# Patient Record
Sex: Male | Born: 1945 | Race: White | Hispanic: No | Marital: Married | Smoking: Current every day smoker
Health system: Southern US, Community
[De-identification: ages and names within clinical notes are randomized; demographics above are authoritative.]

## PROBLEM LIST (undated history)

## (undated) ENCOUNTER — Ambulatory Visit (HOSPITAL_BASED_OUTPATIENT_CLINIC_OR_DEPARTMENT_OTHER)

## (undated) DIAGNOSIS — R0600 Dyspnea, unspecified: Secondary | ICD-10-CM

## (undated) DIAGNOSIS — C801 Malignant (primary) neoplasm, unspecified: Secondary | ICD-10-CM

## (undated) DIAGNOSIS — I712 Thoracic aortic aneurysm, without rupture, unspecified: Secondary | ICD-10-CM

## (undated) DIAGNOSIS — J984 Other disorders of lung: Secondary | ICD-10-CM

## (undated) DIAGNOSIS — I10 Essential (primary) hypertension: Secondary | ICD-10-CM

## (undated) DIAGNOSIS — R05 Cough: Secondary | ICD-10-CM

## (undated) DIAGNOSIS — J449 Chronic obstructive pulmonary disease, unspecified: Secondary | ICD-10-CM

## (undated) DIAGNOSIS — F419 Anxiety disorder, unspecified: Secondary | ICD-10-CM

## (undated) DIAGNOSIS — K219 Gastro-esophageal reflux disease without esophagitis: Secondary | ICD-10-CM

## (undated) DIAGNOSIS — J40 Bronchitis, not specified as acute or chronic: Secondary | ICD-10-CM

## (undated) HISTORY — PX: EYE SURGERY: SHX253

## (undated) HISTORY — PX: LUNG LOBECTOMY: SHX167

## (undated) HISTORY — DX: Other disorders of lung: J98.4

## (undated) HISTORY — PX: HERNIA REPAIR: SHX51

## (undated) HISTORY — PX: DIAGNOSTIC LAPAROSCOPY: SUR761

## (undated) HISTORY — DX: Cough: R05

---

## 1999-12-21 ENCOUNTER — Encounter: Payer: Self-pay | Admitting: Family Medicine

## 1999-12-21 ENCOUNTER — Encounter: Admission: RE | Admit: 1999-12-21 | Discharge: 1999-12-21 | Payer: Self-pay | Admitting: *Deleted

## 2002-11-05 ENCOUNTER — Ambulatory Visit (HOSPITAL_COMMUNITY): Admission: RE | Admit: 2002-11-05 | Discharge: 2002-11-05 | Payer: Self-pay

## 2005-05-01 ENCOUNTER — Emergency Department (HOSPITAL_COMMUNITY): Admission: EM | Admit: 2005-05-01 | Discharge: 2005-05-01 | Payer: Self-pay | Admitting: Emergency Medicine

## 2007-07-06 ENCOUNTER — Ambulatory Visit: Payer: Self-pay | Admitting: Internal Medicine

## 2007-07-06 LAB — CONVERTED CEMR LAB
Albumin: 3.9 g/dL (ref 3.5–5.2)
Alkaline Phosphatase: 59 units/L (ref 39–117)
BUN: 11 mg/dL (ref 6–23)
Basophils Absolute: 0 10*3/uL (ref 0.0–0.1)
Cholesterol: 191 mg/dL (ref 0–200)
GFR calc Af Amer: 127 mL/min
HCT: 41 % (ref 39.0–52.0)
HDL: 59.5 mg/dL (ref >39.0)
Hemoglobin: 14.3 g/dL (ref 13.0–17.0)
Lymphocytes Relative: 29.2 % (ref 12.0–46.0)
MCHC: 34.8 g/dL (ref 30.0–36.0)
MCV: 95.5 fL (ref 78.0–100.0)
Monocytes Absolute: 1 10*3/uL — ABNORMAL HIGH (ref 0.2–0.7)
Monocytes Relative: 11.4 % — ABNORMAL HIGH (ref 3.0–11.0)
Neutro Abs: 4.9 10*3/uL (ref 1.4–7.7)
Neutrophils Relative %: 53.9 % (ref 43.0–77.0)
Potassium: 4.5 meq/L (ref 3.5–5.1)
Sodium: 141 meq/L (ref 135–145)
TSH: 2.15 microintl units/mL (ref 0.35–5.50)
Total Bilirubin: 0.5 mg/dL (ref 0.3–1.2)
Total Protein: 7.3 g/dL (ref 6.0–8.3)

## 2007-07-13 ENCOUNTER — Ambulatory Visit: Payer: Self-pay | Admitting: Internal Medicine

## 2007-07-13 ENCOUNTER — Encounter: Payer: Self-pay | Admitting: Internal Medicine

## 2007-07-13 DIAGNOSIS — J984 Other disorders of lung: Secondary | ICD-10-CM

## 2007-07-13 DIAGNOSIS — R059 Cough, unspecified: Secondary | ICD-10-CM

## 2007-07-13 DIAGNOSIS — R05 Cough: Secondary | ICD-10-CM

## 2007-07-13 HISTORY — DX: Other disorders of lung: J98.4

## 2007-07-13 HISTORY — DX: Cough, unspecified: R05.9

## 2007-07-27 ENCOUNTER — Ambulatory Visit: Payer: Self-pay | Admitting: Cardiology

## 2007-08-01 ENCOUNTER — Encounter: Payer: Self-pay | Admitting: Internal Medicine

## 2007-08-17 ENCOUNTER — Ambulatory Visit: Payer: Self-pay | Admitting: Internal Medicine

## 2007-08-17 ENCOUNTER — Telehealth: Payer: Self-pay | Admitting: Internal Medicine

## 2007-08-17 LAB — CONVERTED CEMR LAB
CRP, High Sensitivity: 3 (ref 0.00–5.00)
PSA: 2.4 ng/mL (ref 0.10–4.00)

## 2007-08-24 ENCOUNTER — Ambulatory Visit: Payer: Self-pay | Admitting: Internal Medicine

## 2008-08-11 ENCOUNTER — Telehealth: Payer: Self-pay | Admitting: Internal Medicine

## 2008-08-14 ENCOUNTER — Encounter (INDEPENDENT_AMBULATORY_CARE_PROVIDER_SITE_OTHER): Payer: Self-pay | Admitting: *Deleted

## 2008-08-14 ENCOUNTER — Encounter: Payer: Self-pay | Admitting: Internal Medicine

## 2009-03-26 ENCOUNTER — Ambulatory Visit: Payer: Self-pay | Admitting: Internal Medicine

## 2009-05-29 ENCOUNTER — Encounter: Admission: RE | Admit: 2009-05-29 | Discharge: 2009-05-29 | Payer: Self-pay | Admitting: Neurology

## 2009-06-05 ENCOUNTER — Ambulatory Visit: Payer: Self-pay | Admitting: Family Medicine

## 2009-06-19 ENCOUNTER — Ambulatory Visit: Payer: Self-pay | Admitting: Orthopedic Surgery

## 2010-06-24 ENCOUNTER — Ambulatory Visit: Payer: Self-pay | Admitting: Specialist

## 2010-08-29 ENCOUNTER — Encounter: Payer: Self-pay | Admitting: Internal Medicine

## 2010-12-24 NOTE — Op Note (Signed)
NAME:  Stephen Leon, Stephen Leon NO.:  [id number]   MEDICAL RECORD NO.:  [id number]                   PATIENT TYPE:  AMB   LOCATION:  DAY                                  FACILITY:  [facility]   PHYSICIAN:  Lorre Munroe., M.D.            DATE OF BIRTH:  05/06/1946   DATE OF PROCEDURE:  11/05/2002  DATE OF DISCHARGE:                                 OPERATIVE REPORT   PREOPERATIVE DIAGNOSIS:  Left inguinal hernia.   POSTOPERATIVE DIAGNOSIS:  Direct left inguinal hernia.   OPERATION:  Repair of left inguinal hernia.   SURGEON:  Lebron Conners, M.D.   ANESTHESIA:  Local with sedation and monitored anesthesia care.   PROCEDURE:  After the patient was monitored and sedated and had routine  preparation and draping of the left inguinal region, I liberally infused  0.5% bupivacaine with epinephrine into the subcutaneous tissues and into the  deep tissues attempting to block the ilioinguinal nerve.  I reinforced the  local anesthetic block as I progressed with the operation.  I made a short  oblique incision about 5 cm in length beginning just above and lateral to  the pubic tubercle and dissected down through the subcutaneous tissues until  I encountered the external oblique.  I opened the external oblique in the  direction of the fibers of the aponeurosis exposing the spermatic cord and  taking care to avoid injury to the ilioinguinal and iliohypogastric nerves.  I then encircled the cord with a Penrose drain at the level of the pubic  tubercle and elevated it.  There was an obvious direct hernia medial to  cord.  I dissected that away from the cord and excluded an indirect hernia  by dissecting the cord itself.  I placed a plug of polypropylene mesh into  the hernia defect and sewed it in with running 2-0 silk stitch.  I then  fashioned a patch of polypropylene mesh cut with a slit in it to allow the  spermatic cord to exit into the scrotum and I sewed that in  from the pubic  tubercle with 2-0 Prolene running suture basting the stitch into the  internal oblique fascia superiorly and medially and running it in the  shelving edge of the inguinal ligament laterally and inferiorly.  I then  placed one stitch in the tails of the mesh lateral to the cord and cut away  that redundant part of the mesh and placed the tails under the external  oblique.  Closed the external oblique with running 3-0 Vicryl and closed the  subcutaneous tissues with a separate layer of running 3-0 Vicryl and then  closed with skin with intracuticular 4-0 Vicryl and Steri-Strips.  Bleeding  was not a problem.  The patient was comfortable throughout the procedure.  Sponge, instrument, and needle counts were correct.  Lorre Munroe., M.D.    Jodi Marble  D:  11/05/2002  T:  11/05/2002  Job:  [id number]

## 2011-08-09 DIAGNOSIS — J189 Pneumonia, unspecified organism: Secondary | ICD-10-CM

## 2011-08-09 HISTORY — DX: Pneumonia, unspecified organism: J18.9

## 2012-03-18 ENCOUNTER — Emergency Department (HOSPITAL_COMMUNITY): Payer: Managed Care, Other (non HMO)

## 2012-03-18 ENCOUNTER — Encounter (HOSPITAL_COMMUNITY): Payer: Self-pay | Admitting: Emergency Medicine

## 2012-03-18 ENCOUNTER — Emergency Department (HOSPITAL_COMMUNITY)
Admission: EM | Admit: 2012-03-18 | Discharge: 2012-03-18 | Disposition: A | Discharge status: Home / Self Care | Payer: Managed Care, Other (non HMO) | Attending: Emergency Medicine | Admitting: Emergency Medicine

## 2012-03-18 DIAGNOSIS — F17200 Nicotine dependence, unspecified, uncomplicated: Secondary | ICD-10-CM | POA: Insufficient documentation

## 2012-03-18 DIAGNOSIS — S7000XA Contusion of unspecified hip, initial encounter: Secondary | ICD-10-CM | POA: Insufficient documentation

## 2012-03-18 DIAGNOSIS — W010XXA Fall on same level from slipping, tripping and stumbling without subsequent striking against object, initial encounter: Secondary | ICD-10-CM | POA: Insufficient documentation

## 2012-03-18 DIAGNOSIS — S7001XA Contusion of right hip, initial encounter: Secondary | ICD-10-CM

## 2012-03-18 MED ORDER — OXYCODONE-ACETAMINOPHEN 5-325 MG PO TABS
1.0000 | ORAL_TABLET | Freq: Once | ORAL | Status: AC
  Administered 2012-03-18: 1 via ORAL
  Filled 2012-03-18: qty 1

## 2012-03-18 MED ORDER — OXYCODONE-ACETAMINOPHEN 5-325 MG PO TABS: 1.0000 | ORAL_TABLET | ORAL | Status: AC | PRN
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
## 2012-03-18 NOTE — ED Provider Notes (Signed)
Medical screening examination/treatment/procedure(s) were performed by non-physician practitioner and as supervising physician I was immediately available for consultation/collaboration.  Arieonna Medine, MD 03/18/12 1724 
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
## 2012-05-04 DIAGNOSIS — Z720 Tobacco use: Secondary | ICD-10-CM | POA: Insufficient documentation

## 2012-05-04 DIAGNOSIS — C3490 Malignant neoplasm of unspecified part of unspecified bronchus or lung: Secondary | ICD-10-CM | POA: Insufficient documentation

## 2012-05-16 DIAGNOSIS — C3490 Malignant neoplasm of unspecified part of unspecified bronchus or lung: Secondary | ICD-10-CM | POA: Insufficient documentation

## 2012-05-17 HISTORY — PX: OTHER SURGICAL HISTORY: SHX169

## 2012-06-29 DIAGNOSIS — Z902 Acquired absence of lung [part of]: Secondary | ICD-10-CM | POA: Insufficient documentation

## 2014-08-08 DIAGNOSIS — C801 Malignant (primary) neoplasm, unspecified: Secondary | ICD-10-CM

## 2014-08-08 HISTORY — DX: Malignant (primary) neoplasm, unspecified: C80.1

## 2015-01-14 DIAGNOSIS — M792 Neuralgia and neuritis, unspecified: Secondary | ICD-10-CM | POA: Insufficient documentation

## 2015-06-16 DIAGNOSIS — I10 Essential (primary) hypertension: Secondary | ICD-10-CM | POA: Insufficient documentation

## 2015-08-24 ENCOUNTER — Other Ambulatory Visit: Payer: Self-pay | Admitting: Physician Assistant

## 2015-08-24 ENCOUNTER — Ambulatory Visit
Admission: RE | Admit: 2015-08-24 | Discharge: 2015-08-24 | Disposition: A | Discharge status: Home / Self Care | Payer: Commercial Managed Care - HMO | Source: Ambulatory Visit | Attending: Physician Assistant | Admitting: Physician Assistant

## 2015-08-24 DIAGNOSIS — I2510 Atherosclerotic heart disease of native coronary artery without angina pectoris: Secondary | ICD-10-CM | POA: Insufficient documentation

## 2015-08-24 DIAGNOSIS — R1011 Right upper quadrant pain: Secondary | ICD-10-CM

## 2015-08-24 DIAGNOSIS — K5730 Diverticulosis of large intestine without perforation or abscess without bleeding: Secondary | ICD-10-CM | POA: Insufficient documentation

## 2015-08-24 DIAGNOSIS — J432 Centrilobular emphysema: Secondary | ICD-10-CM | POA: Diagnosis not present

## 2015-08-24 DIAGNOSIS — J449 Chronic obstructive pulmonary disease, unspecified: Secondary | ICD-10-CM | POA: Diagnosis not present

## 2015-08-24 DIAGNOSIS — R0781 Pleurodynia: Secondary | ICD-10-CM | POA: Diagnosis not present

## 2015-08-24 DIAGNOSIS — M4856XA Collapsed vertebra, not elsewhere classified, lumbar region, initial encounter for fracture: Secondary | ICD-10-CM | POA: Insufficient documentation

## 2015-08-24 DIAGNOSIS — I10 Essential (primary) hypertension: Secondary | ICD-10-CM | POA: Diagnosis not present

## 2015-08-24 DIAGNOSIS — J929 Pleural plaque without asbestos: Secondary | ICD-10-CM | POA: Diagnosis not present

## 2015-08-24 DIAGNOSIS — C801 Malignant (primary) neoplasm, unspecified: Secondary | ICD-10-CM | POA: Diagnosis not present

## 2015-08-24 HISTORY — DX: Malignant (primary) neoplasm, unspecified: C80.1

## 2015-08-24 HISTORY — DX: Essential (primary) hypertension: I10

## 2015-08-24 LAB — POCT I-STAT CREATININE: CREATININE: 1.1 mg/dL (ref 0.61–1.24)

## 2015-08-24 MED ORDER — IOHEXOL 300 MG/ML  SOLN
100.0000 mL | Freq: Once | INTRAMUSCULAR | Status: AC | PRN
  Administered 2015-08-24: 100 mL via INTRAVENOUS

## 2015-08-28 DIAGNOSIS — I10 Essential (primary) hypertension: Secondary | ICD-10-CM | POA: Diagnosis not present

## 2015-08-28 DIAGNOSIS — R109 Unspecified abdominal pain: Secondary | ICD-10-CM | POA: Diagnosis not present

## 2015-08-28 DIAGNOSIS — J410 Simple chronic bronchitis: Secondary | ICD-10-CM | POA: Diagnosis not present

## 2015-08-28 DIAGNOSIS — R1900 Intra-abdominal and pelvic swelling, mass and lump, unspecified site: Secondary | ICD-10-CM | POA: Diagnosis not present

## 2015-08-28 DIAGNOSIS — G8929 Other chronic pain: Secondary | ICD-10-CM | POA: Diagnosis not present

## 2015-08-28 DIAGNOSIS — C801 Malignant (primary) neoplasm, unspecified: Secondary | ICD-10-CM | POA: Diagnosis not present

## 2015-08-28 DIAGNOSIS — J209 Acute bronchitis, unspecified: Secondary | ICD-10-CM | POA: Insufficient documentation

## 2015-09-18 ENCOUNTER — Telehealth: Payer: Self-pay

## 2015-09-18 NOTE — Telephone Encounter (Signed)
Pts wife called and wanted Dr. Dossie Arbour nurse to know that her husband is in serious pain and she wants him to see Dr Dossie Arbour very quickly. Pts wife would like for Kori to call her so they could discuss this issue even though I let pt know how the process works and we were doing the best we could to get him in to see DR. Dossie Arbour

## 2015-09-18 NOTE — Telephone Encounter (Signed)
Patient notified that Dr. Dossie Arbour has accepted pt for a FT, approval for PA was sent on 09/15/15. We are waiting for PA. After we get approval, we will be able to see him soon.

## 2015-09-24 ENCOUNTER — Telehealth: Payer: Self-pay

## 2015-09-24 NOTE — Telephone Encounter (Signed)
Discussed with Angie and she would take care of getting patient appointment for eval and calling the insurance company.

## 2015-09-24 NOTE — Telephone Encounter (Signed)
They need a peer review in order to approve the injection you ordered. Even though you havent seen him, you did order the procedure and they want to know why you think this is going to be beneficial to the patient.  If you agree to do this, I will call them back and give them an available date and time for them to call.

## 2015-09-24 NOTE — Telephone Encounter (Signed)
Contact the company and tell them that they should expect an evaluation in writing from [place], after the patient's initial evaluation. Call the patient and explained to the patient that we cannot do the procedure without on initial evaluation as his insurance company requires an explanation for the procedure.

## 2015-09-25 ENCOUNTER — Telehealth: Payer: Self-pay | Admitting: Pain Medicine

## 2015-09-25 NOTE — Telephone Encounter (Signed)
[place] called, insurance informed them patient will have to do eval with Dr. Dossie Arbour before they will approve epidural / patient has appt on Monday

## 2015-09-28 ENCOUNTER — Encounter: Payer: Self-pay | Admitting: Pain Medicine

## 2015-09-28 ENCOUNTER — Ambulatory Visit: Payer: Commercial Managed Care - HMO | Attending: Pain Medicine | Admitting: Pain Medicine

## 2015-09-28 VITALS — BP 152/89 | HR 69 | Temp 98.2°F | Resp 16 | Ht 68.0 in | Wt 165.0 lb

## 2015-09-28 DIAGNOSIS — G548 Other nerve root and plexus disorders: Secondary | ICD-10-CM | POA: Diagnosis not present

## 2015-09-28 DIAGNOSIS — Z87891 Personal history of nicotine dependence: Secondary | ICD-10-CM | POA: Diagnosis not present

## 2015-09-28 DIAGNOSIS — Z85118 Personal history of other malignant neoplasm of bronchus and lung: Secondary | ICD-10-CM | POA: Insufficient documentation

## 2015-09-28 DIAGNOSIS — R109 Unspecified abdominal pain: Secondary | ICD-10-CM | POA: Diagnosis not present

## 2015-09-28 DIAGNOSIS — G8929 Other chronic pain: Secondary | ICD-10-CM | POA: Diagnosis not present

## 2015-09-28 DIAGNOSIS — I10 Essential (primary) hypertension: Secondary | ICD-10-CM | POA: Insufficient documentation

## 2015-09-28 DIAGNOSIS — R079 Chest pain, unspecified: Secondary | ICD-10-CM | POA: Insufficient documentation

## 2015-09-28 DIAGNOSIS — E785 Hyperlipidemia, unspecified: Secondary | ICD-10-CM | POA: Diagnosis not present

## 2015-09-28 DIAGNOSIS — R911 Solitary pulmonary nodule: Secondary | ICD-10-CM | POA: Diagnosis not present

## 2015-09-28 DIAGNOSIS — C801 Malignant (primary) neoplasm, unspecified: Secondary | ICD-10-CM | POA: Insufficient documentation

## 2015-09-28 DIAGNOSIS — J449 Chronic obstructive pulmonary disease, unspecified: Secondary | ICD-10-CM | POA: Insufficient documentation

## 2015-09-28 DIAGNOSIS — G588 Other specified mononeuropathies: Secondary | ICD-10-CM | POA: Insufficient documentation

## 2015-09-28 NOTE — Progress Notes (Signed)
Safety precautions to be maintained throughout the outpatient stay will include: orient to surroundings, keep bed in low position, maintain call bell within reach at all times, provide assistance with transfer out of bed and ambulation.  

## 2015-09-28 NOTE — Patient Instructions (Signed)
GENERAL RISKS AND COMPLICATIONS  What are the risk, side effects and possible complications? Generally speaking, most procedures are safe.  However, with any procedure there are risks, side effects, and the possibility of complications.  The risks and complications are dependent upon the sites that are lesioned, or the type of nerve block to be performed.  The closer the procedure is to the spine, the more serious the risks are.  Great care is taken when placing the radio frequency needles, block needles or lesioning probes, but sometimes complications can occur. 1. Infection: Any time there is an injection through the skin, there is a risk of infection.  This is why sterile conditions are used for these blocks.  There are four possible types of infection. 1. Localized skin infection. 2. Central Nervous System Infection-This can be in the form of Meningitis, which can be deadly. 3. Epidural Infections-This can be in the form of an epidural abscess, which can cause pressure inside of the spine, causing compression of the spinal cord with subsequent paralysis. This would require an emergency surgery to decompress, and there are no guarantees that the patient would recover from the paralysis. 4. Discitis-This is an infection of the intervertebral discs.  It occurs in about 1% of discography procedures.  It is difficult to treat and it may lead to surgery.        2. Pain: the needles have to go through skin and soft tissues, will cause soreness.       3. Damage to internal structures:  The nerves to be lesioned may be near blood vessels or    other nerves which can be potentially damaged.       4. Bleeding: Bleeding is more common if the patient is taking blood thinners such as  aspirin, Coumadin, Ticiid, Plavix, etc., or if he/she have some genetic predisposition  such as hemophilia. Bleeding into the spinal canal can cause compression of the spinal  cord with subsequent paralysis.  This would require an  emergency surgery to  decompress and there are no guarantees that the patient would recover from the  paralysis.       5. Pneumothorax:  Puncturing of a lung is a possibility, every time a needle is introduced in  the area of the chest or upper back.  Pneumothorax refers to free air around the  collapsed lung(s), inside of the thoracic cavity (chest cavity).  Another two possible  complications related to a similar event would include: Hemothorax and Chylothorax.   These are variations of the Pneumothorax, where instead of air around the collapsed  lung(s), you may have blood or chyle, respectively.       6. Spinal headaches: They may occur with any procedures in the area of the spine.       7. Persistent CSF (Cerebro-Spinal Fluid) leakage: This is a rare problem, but may occur  with prolonged intrathecal or epidural catheters either due to the formation of a fistulous  track or a dural tear.       8. Nerve damage: By working so close to the spinal cord, there is always a possibility of  nerve damage, which could be as serious as a permanent spinal cord injury with  paralysis.       9. Death:  Although rare, severe deadly allergic reactions known as "Anaphylactic  reaction" can occur to any of the medications used.      10. Worsening of the symptoms:  We can always make thing worse.    What are the chances of something like this happening? Chances of any of this occuring are extremely low.  By statistics, you have more of a chance of getting killed in a motor vehicle accident: while driving to the hospital than any of the above occurring .  Nevertheless, you should be aware that they are possibilities.  In general, it is similar to taking a shower.  Everybody knows that you can slip, hit your head and get killed.  Does that mean that you should not shower again?  Nevertheless always keep in mind that statistics do not mean anything if you happen to be on the wrong side of them.  Even if a procedure has a 1  (one) in a 1,000,000 (million) chance of going wrong, it you happen to be that one..Also, keep in mind that by statistics, you have more of a chance of having something go wrong when taking medications.  Who should not have this procedure? If you are on a blood thinning medication (e.g. Coumadin, Plavix, see list of "Blood Thinners"), or if you have an active infection going on, you should not have the procedure.  If you are taking any blood thinners, please inform your physician.  How should I prepare for this procedure?  Do not eat or drink anything at least six hours prior to the procedure.  Bring a driver with you .  It cannot be a taxi.  Come accompanied by an adult that can drive you back, and that is strong enough to help you if your legs get weak or numb from the local anesthetic.  Take all of your medicines the morning of the procedure with just enough water to swallow them.  If you have diabetes, make sure that you are scheduled to have your procedure done first thing in the morning, whenever possible.  If you have diabetes, take only half of your insulin dose and notify our nurse that you have done so as soon as you arrive at the clinic.  If you are diabetic, but only take blood sugar pills (oral hypoglycemic), then do not take them on the morning of your procedure.  You may take them after you have had the procedure.  Do not take aspirin or any aspirin-containing medications, at least eleven (11) days prior to the procedure.  They may prolong bleeding.  Wear loose fitting clothing that may be easy to take off and that you would not mind if it got stained with Betadine or blood.  Do not wear any jewelry or perfume  Remove any nail coloring.  It will interfere with some of our monitoring equipment.  NOTE: Remember that this is not meant to be interpreted as a complete list of all possible complications.  Unforeseen problems may occur.  BLOOD THINNERS The following drugs  contain aspirin or other products, which can cause increased bleeding during surgery and should not be taken for 2 weeks prior to and 1 week after surgery.  If you should need take something for relief of minor pain, you may take acetaminophen which is found in Tylenol,m Datril, Anacin-3 and Panadol. It is not blood thinner. The products listed below are.  Do not take any of the products listed below in addition to any listed on your instruction sheet.  A.P.C or A.P.C with Codeine Codeine Phosphate Capsules #3 Ibuprofen Ridaura  ABC compound Congesprin Imuran rimadil  Advil Cope Indocin Robaxisal  Alka-Seltzer Effervescent Pain Reliever and Antacid Coricidin or Coricidin-D  Indomethacin Rufen    Alka-Seltzer plus Cold Medicine Cosprin Ketoprofen S-A-C Tablets  Anacin Analgesic Tablets or Capsules Coumadin Korlgesic Salflex  Anacin Extra Strength Analgesic tablets or capsules CP-2 Tablets Lanoril Salicylate  Anaprox Cuprimine Capsules Levenox Salocol  Anexsia-D Dalteparin Magan Salsalate  Anodynos Darvon compound Magnesium Salicylate Sine-off  Ansaid Dasin Capsules Magsal Sodium Salicylate  Anturane Depen Capsules Marnal Soma  APF Arthritis pain formula Dewitt's Pills Measurin Stanback  Argesic Dia-Gesic Meclofenamic Sulfinpyrazone  Arthritis Bayer Timed Release Aspirin Diclofenac Meclomen Sulindac  Arthritis pain formula Anacin Dicumarol Medipren Supac  Analgesic (Safety coated) Arthralgen Diffunasal Mefanamic Suprofen  Arthritis Strength Bufferin Dihydrocodeine Mepro Compound Suprol  Arthropan liquid Dopirydamole Methcarbomol with Aspirin Synalgos  ASA tablets/Enseals Disalcid Micrainin Tagament  Ascriptin Doan's Midol Talwin  Ascriptin A/D Dolene Mobidin Tanderil  Ascriptin Extra Strength Dolobid Moblgesic Ticlid  Ascriptin with Codeine Doloprin or Doloprin with Codeine Momentum Tolectin  Asperbuf Duoprin Mono-gesic Trendar  Aspergum Duradyne Motrin or Motrin IB Triminicin  Aspirin  plain, buffered or enteric coated Durasal Myochrisine Trigesic  Aspirin Suppositories Easprin Nalfon Trillsate  Aspirin with Codeine Ecotrin Regular or Extra Strength Naprosyn Uracel  Atromid-S Efficin Naproxen Ursinus  Auranofin Capsules Elmiron Neocylate Vanquish  Axotal Emagrin Norgesic Verin  Azathioprine Empirin or Empirin with Codeine Normiflo Vitamin E  Azolid Emprazil Nuprin Voltaren  Bayer Aspirin plain, buffered or children's or timed BC Tablets or powders Encaprin Orgaran Warfarin Sodium  Buff-a-Comp Enoxaparin Orudis Zorpin  Buff-a-Comp with Codeine Equegesic Os-Cal-Gesic   Buffaprin Excedrin plain, buffered or Extra Strength Oxalid   Bufferin Arthritis Strength Feldene Oxphenbutazone   Bufferin plain or Extra Strength Feldene Capsules Oxycodone with Aspirin   Bufferin with Codeine Fenoprofen Fenoprofen Pabalate or Pabalate-SF   Buffets II Flogesic Panagesic   Buffinol plain or Extra Strength Florinal or Florinal with Codeine Panwarfarin   Buf-Tabs Flurbiprofen Penicillamine   Butalbital Compound Four-way cold tablets Penicillin   Butazolidin Fragmin Pepto-Bismol   Carbenicillin Geminisyn Percodan   Carna Arthritis Reliever Geopen Persantine   Carprofen Gold's salt Persistin   Chloramphenicol Goody's Phenylbutazone   Chloromycetin Haltrain Piroxlcam   Clmetidine heparin Plaquenil   Cllnoril Hyco-pap Ponstel   Clofibrate Hydroxy chloroquine Propoxyphen         Before stopping any of these medications, be sure to consult the physician who ordered them.  Some, such as Coumadin (Warfarin) are ordered to prevent or treat serious conditions such as "deep thrombosis", "pumonary embolisms", and other heart problems.  The amount of time that you may need off of the medication may also vary with the medication and the reason for which you were taking it.  If you are taking any of these medications, please make sure you notify your pain physician before you undergo any  procedures.         Intercostal Nerve Block Patient Information  Description: The twelve intercostal nerves arise from the first thru twelfth thoracic nerve roots.  The nerve begins at the spine and wraps around the body, lying in a groove underneath each rib.  Each intercostal nerve innervates a specific strip of skin and body walk of the abdomen and chest.  Therefore, injuries of the chest wall or abdominal wall result in pain that is transmitted back to the brian via the intercostal nerves.  Examples of such injuries include rib fractures and incisions for lung and gall bladder surgery.  Occasionally, pain may persist long after an injury or surgical incision secondary to inflammation and irritation of the intercostal nerve.  The longstanding pain is known  as intercostal neuralgia.  An intercostal nerve block is preformed to eliminate pain either temporarily or permanently.  A small needle is placed below the rib and local anesthetic (like Novocaine) and possibly steroid is injected.  Usually 2-4 intercostal nerves are blocked at a time depending on the problem.  The patient will experience a slight "pin-prick" sensation for each injection.  Shortly thereafter, the strip of skin that is innervated by the blocked intercostal nerve will feel numb.  Persistent pain that is only temporarily relieved with local anesthetic may require a more permanent block. This procedure is called Cryoneurolysis and entails placing a small probe beneath the rib to freeze the nerve.  Conditions that may be treated by intercostal nerve blocks:   Rib fractures  Longstanding pain from surgery of the chest or abdomen (intercostal neuralgia)  Pain from chest tubes  Pain from trauma to the chest  Preparation for the injections:  1. Do not eat any solid food or dairy products within 6 hours of your appointment. 2. You may drink clear liquids up to 2 hours before appointment.  Clear liquids include water, black  coffee, juice or soda.  No milk or cream please. 3. You may take your regular medication, including pain medications, with a sip of water before your appointment.  Diabetics should hold regular insulin (if take separately) and take 1/2 normal NPH dose the morning of the procedure.   Carry some sugar containing items with you to your appointment. 4. A driver must accompany you and be prepared to drive you home after your procedure. 5. Bring all your current medications with you. 6. An IV may be inserted and sedation may be given at the discretion of the physician. 7. A blood pressure cuff, EKG and other monitors will often be applied during the procedure.  Some patients may need to have extra oxygen administered for a short period. 8. You will be asked to provide medical information, including your allergies, prior to the procedure.  We must know immediately if you are taking blood thinners (like Coumadin/Warfarin) or if you are allergic to IV iodine contrast (dye). We must know if you could possible be pregnant.  Possible side-effects:   Bleeding from needle site  Infection (rare)  Nerve injury (rare)  Numbness & tingling of skin  Collapsed lung requiring chest tube (rare)  Local anesthetic toxicity (rare)  Light-headedness (temporary)  Pain at injection site (several days)  Decreased blood pressure (temporary)  Shortness of breath  Jittery/shaking sensation (temporary)  Call if you experience:   Difficulty breathing or hives (go directly to the emergency room)  Redness, inflammation or drainage at the injection site  Severe pain at the site of the injection  Any new symptoms which are concerning   Please note:  Your pain may subside immediately but may return several hours after the injection.  Often, more than one injection is required to reduce the pain. Also, if several temporary blocks with local anesthetic are ineffective, a more permanent block with cryolysis may  be necessary.  This will be discussed with you should this be the case.  If you have any questions, please call [phone] [facility]

## 2015-09-28 NOTE — Progress Notes (Signed)
Patient's Name: Stephen Leon MRN: [id number] DOB: 24-Oct-1945 DOS: 09/28/2015  Primary Reason(s) for Visit: Initial Patient Evaluation CC: Abdominal Pain   HPI  Stephen Leon is a 70 y.o. year old, male patient, who comes today for an initial evaluation. 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 His primarily concern today is the Abdominal Pain    The patient comes in today clinics today for his initial evaluation. He indicates that approximately 3-1/2 years ago he had a surgical intervention under his right ribs 4 and a prior biopsy and after that he has continued to have pain in the flank and abdominal region. This pain appears to be following either an intercostal pattern or a thoracic dermatomal pattern.  He is very clear that he does not want any pain medications as he has had some problems with those in the past. 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  Reported Pain Score: 5  Reported level is inconsistent with clinical obrservations. Pain Type: Chronic pain Pain Location: Abdomen Pain Orientation: Right Pain Descriptors / Indicators: Burning, Pins and needles Pain Frequency: Constant  Onset and Duration: Sudden, Date of onset: 3-1/2 years ago and Present longer than 3 months Cause of pain: Surgery Severity: No change since onset, NAS-11 at its worse: 8/10,  NAS-11 at its best: 3/10, NAS-11 now: 3/10 and NAS-11 on the average: 6/10 Timing: During activity or exercise Aggravating Factors: Lifiting, Motion, Prolonged sitting, Twisting and Walking Alleviating Factors: Resting Associated Problems: Inability to concentrate, Swelling and Pain that does not allow patient to sleep Quality of Pain: Aching, Annoying, Burning, Feeling of constriction, Lancinating, Shooting and Tingling Previous Examinations or Tests: CT scan, MRI scan and X-rays Previous Treatments: Narcotic medications. The patient denies any interventional treatments.  Historic Controlled Substance Pharmacotherapy Review  Previously Prescribed Opioids:  Analgesic: He has tried several things such as oxycodone, OxyContin, and he is currently taking Ultram which was given to him because of a trip that he wants to take and enjoyed. However, he has indicated his desire not to use any opioids. Apparently he had some problems with addiction to the OxyContin.  Pharmacologic Plan: No controlled substances will be used as requested by the patient.  Neuromodulation Therapy Review  Type: No neuromodulatory devices implanted Side-effects or Adverse reactions: No device reported Effectiveness: No device reported  Allergies  Stephen Leon is allergic to codeine.  Meds  The patient has a current medication list which includes the following prescription(s): albuterol, gabapentin, and tramadol, and the following Facility-Administered Medications: lidocaine (pf), methylprednisolone acetate, and ropivacaine (pf) 2 mg/ml (0.2%). Requested Prescriptions    No prescriptions requested or ordered in this encounter    ROS  Cardiovascular History: Negative for hypertension, coronary artery diseas, myocardial infraction, anticoagulant therapy or heart failure Pulmonary or Respiratory History: Lung problems, Shortness of breath, Smoker and Bronchitis. He does have a history of lung cancer on that  right side. Neurological History: Negative for epilepsy, stroke, urinary or  fecal inontinence, spina bifida or tethered cord syndrome Psychological-Psychiatric History: Negative for anxiety, depression, schizophrenia, bipolar disorders or suicidal ideations or attempts Gastrointestinal History: Negative for peptic ulcer disease, hiatal hernia, GERD, IBS, hepatitis, cirrhosis or pancreatitis Genitourinary History: Negative for nephrolithiasis, hematuria, renal failure or chronic kidney disease Hematological History: Negative for anticoagulant therapy, anemia, bruising or bleeding easily, hemophilia, sickle cell disease or trait, thrombocytopenia or coagulupathies Endocrine History: Negative for diabetes or thyroid disease Rheumatologic History: Negative for lupus, osteoarthritis, rheumatoid arthritis, myositis, polymyositis or fibromyagia Musculoskeletal History: Negative for myasthenia gravis, muscular dystrophy, multiple sclerosis or malignant hyperthermia Work History: Retired  [employer]  Medical:  Stephen Leon  has a past medical history of Cancer ([place]) and Hypertension. Family: family history includes COPD in his mother. Surgical:  has past surgical history that includes Hernia repair. Tobacco:  reports that he has quit smoking. He does not have any smokeless tobacco history on file. Alcohol:  reports that he drinks about 7.2 oz of alcohol per week. Drug:  reports that he does not use illicit drugs.  Physical Exam  Vitals:  Today's Vitals   09/28/15 1159 09/28/15 1202  BP: 152/89   Pulse: 69   Temp: 98.2 F (36.8 C)   Resp: 16   Height: '5\' 8"'$  (1.727 m)   Weight: 165 lb (74.844 kg)   SpO2: 97%   PainSc: 5  5   PainLoc: Abdomen     Calculated BMI: Body mass index is 25.09 kg/(m^2).  General appearance: alert, cooperative, appears stated age and no distress Eyes: PERLA Respiratory: No evidence respiratory distress, no audible rales or ronchi and no use of accessory muscles  of respiration  Cervical Spine Inspection: Normal anatomy Alignment: Symetrical ROM: Adequate Palpation: WNL Provocative Tests: Spurling's Foraminal Stenosis Test: deferred Hoffman's Cervical Myelopathy Test: deferred Lhermitte Sign (Cord Compression/Myelopathy Test):  deferred  Upper Extremities Inspection: No gross anomalies detected ROM: Adequate Sensory: Normal Motor: Unremarkable Pulses: Palpable DTR:  Biceps (C5): WNL Brachioradialis (C6): WNL Triceps (C7): WNL  Thoracic Spine Inspection: Clear evidence of a scar following the T-11 rib on the right side. Alignment: Symetrical ROM: Adequate Palpation: Exquisite tenderness to palpation over the area of his scar.  Lumbar Spine Inspection: No gross anomalies detected Alignment: Symetrical ROM: Adequate Palpation: WNL Provocative Tests: Lumbar Hyperextension and rotation test: deferred Patrick's Maneuver: deferred Gait: WNL  Lower Extremities Inspection: No gross anomalies detected ROM: Adequate Sensory: Normal Motor: Unremarkable  Toe walk (S1): WNL  Heal walk (L5): WNL Pulses: Palpable DTR:  Patellar (L4): WNL Achilles (S1): WNL   Assessment  Primary Diagnosis & Pertinent Problem List: The primary encounter diagnosis was Intercostal neuralgia (Right). A diagnosis of Chronic pain was also pertinent to this visit.  Visit Diagnosis: 1. Intercostal neuralgia (Right)   2. Chronic pain     Assessment: No problem-specific assessment & plan notes found for this encounter.   Plan of Care  Note: As per protocol, today's visit has been an evaluation only. We have not taken over the patient's controlled substance management.  Pharmacotherapy (Medications Ordered): No orders of the defined types were placed in this encounter.    Lab-work & Procedure Ordered: Orders Placed This Encounter  Procedures  . INTERCOSTAL NERVE BLOCK    Standing Status: Future     Number of Occurrences:      Standing  Expiration Date: 09/27/2016    Scheduling Instructions:     Side: Right-sided     Sedation: No Sedation.     Timeframe:  ASAA    Order Specific Question:  Where will this procedure be performed?    Answer:  [facility]    Imaging Ordered: None  Interventional Therapies: Scheduled: Diagnostic, right-sided, intercostal nerve block under fluoroscopic guidance, no sedation. PRN Procedures: If the patient responds well to the intercostal blocks, we will consider the possibility of radiofrequency ablation or cryo-analgesia.    Referral(s) or Consult(s): None at this time.  Medications administered during this visit: Mr. Ellias Mcelreath had no medications administered during this visit.  Future Appointments Date Time Provider [place]  10/14/2015 7:40 AM Milinda Pointer, MD [facility] None    Primary Care Physician: Drema Pry, DO Location: [facility] Note by: Kathlen Brunswick. Dossie Arbour, M.D, DABA, DABAPM, DABPM, DABIPP, FIPP

## 2015-09-29 ENCOUNTER — Ambulatory Visit: Payer: Commercial Managed Care - HMO | Attending: Pain Medicine | Admitting: Pain Medicine

## 2015-09-29 ENCOUNTER — Encounter: Payer: Self-pay | Admitting: Pain Medicine

## 2015-09-29 VITALS — BP 145/73 | HR 70 | Temp 98.3°F | Resp 18 | Ht 68.0 in | Wt 165.0 lb

## 2015-09-29 DIAGNOSIS — R911 Solitary pulmonary nodule: Secondary | ICD-10-CM | POA: Insufficient documentation

## 2015-09-29 DIAGNOSIS — G588 Other specified mononeuropathies: Secondary | ICD-10-CM

## 2015-09-29 DIAGNOSIS — G548 Other nerve root and plexus disorders: Secondary | ICD-10-CM | POA: Diagnosis not present

## 2015-09-29 DIAGNOSIS — G8929 Other chronic pain: Secondary | ICD-10-CM | POA: Diagnosis not present

## 2015-09-29 DIAGNOSIS — E785 Hyperlipidemia, unspecified: Secondary | ICD-10-CM | POA: Insufficient documentation

## 2015-09-29 DIAGNOSIS — F17200 Nicotine dependence, unspecified, uncomplicated: Secondary | ICD-10-CM | POA: Insufficient documentation

## 2015-09-29 DIAGNOSIS — J440 Chronic obstructive pulmonary disease with acute lower respiratory infection: Secondary | ICD-10-CM | POA: Insufficient documentation

## 2015-09-29 DIAGNOSIS — I10 Essential (primary) hypertension: Secondary | ICD-10-CM | POA: Diagnosis not present

## 2015-09-29 DIAGNOSIS — C3490 Malignant neoplasm of unspecified part of unspecified bronchus or lung: Secondary | ICD-10-CM | POA: Diagnosis not present

## 2015-09-29 DIAGNOSIS — J209 Acute bronchitis, unspecified: Secondary | ICD-10-CM | POA: Diagnosis not present

## 2015-09-29 DIAGNOSIS — R109 Unspecified abdominal pain: Secondary | ICD-10-CM | POA: Diagnosis present

## 2015-09-29 DIAGNOSIS — Z9889 Other specified postprocedural states: Secondary | ICD-10-CM | POA: Diagnosis not present

## 2015-09-29 MED ORDER — METHYLPREDNISOLONE ACETATE 80 MG/ML IJ SUSP
INTRAMUSCULAR | Status: AC
  Filled 2015-09-29: qty 1

## 2015-09-29 MED ORDER — LIDOCAINE HCL (PF) 1 % IJ SOLN: 10.0000 mL | Freq: Once | INTRAMUSCULAR | Status: DC

## 2015-09-29 MED ORDER — ROPIVACAINE HCL 2 MG/ML IJ SOLN
INTRAMUSCULAR | Status: AC
  Filled 2015-09-29: qty 10

## 2015-09-29 MED ORDER — METHYLPREDNISOLONE ACETATE 80 MG/ML IJ SUSP: 80.0000 mg | Freq: Once | INTRAMUSCULAR | Status: DC

## 2015-09-29 MED ORDER — ROPIVACAINE HCL 2 MG/ML IJ SOLN: 5.0000 mL | Freq: Once | INTRAMUSCULAR | Status: DC

## 2015-09-29 NOTE — Patient Instructions (Signed)
Please complete the Post Procedure Diary Pain Management Discharge Instructions  General Discharge Instructions :  If you need to reach your doctor call: Monday-Friday 8:00 am - 4:00 pm at [phone] or toll free [phone].  After clinic hours [phone] to have operator reach doctor.  Bring all of your medication bottles to all your appointments in the pain clinic.  To cancel or reschedule your appointment with Pain Management please remember to call 24 hours in advance to avoid a fee.  Refer to the educational materials which you have been given on: General Risks, I had my Procedure. Discharge Instructions, Post Sedation.  Post Procedure Instructions:  The drugs you were given will stay in your system until tomorrow, so for the next 24 hours you should not drive, make any legal decisions or drink any alcoholic beverages.  You may eat anything you prefer, but it is better to start with liquids then soups and crackers, and gradually work up to solid foods.  Please notify your doctor immediately if you have any unusual bleeding, trouble breathing or pain that is not related to your normal pain.  Depending on the type of procedure that was done, some parts of your body may feel week and/or numb.  This usually clears up by tonight or the next day.  Walk with the use of an assistive device or accompanied by an adult for the 24 hours.  You may use ice on the affected area for the first 24 hours.  Put ice in a Ziploc bag and cover with a towel and place against area 15 minutes on 15 minutes off.  You may switch to heat after 24 hours.

## 2015-09-29 NOTE — Progress Notes (Signed)
Safety precautions to be maintained throughout the outpatient stay will include: orient to surroundings, keep bed in low position, maintain call bell within reach at all times, provide assistance with transfer out of bed and ambulation.  

## 2015-09-30 ENCOUNTER — Telehealth: Payer: Self-pay | Admitting: *Deleted

## 2015-09-30 NOTE — Telephone Encounter (Signed)
No answer - mailbox full.

## 2015-10-02 NOTE — Progress Notes (Deleted)
Patient's Name: Stephen Leon MRN: [id number] DOB: [birth year] DOS: 09/29/2015  Primary Reason(s) for Visit: Interventional Pain Management Treatment. CC: Abdominal Pain   Pre-Procedure Assessment:  [name] is a 70 y.o. year old, male patient, seen today for interventional treatment. 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 His primarily concern today is the Abdominal Pain   Today's Initial Pain Score: *** {Blank single:19197::"Reported level of pain is compatible with clinical observation","Clear symptom exaggeration. Reported level of pain is incompatible with clinical observations.","Reported level of pain is incompatible with clinical obrservations. This may be secondary to a possible lack of understanding on how the pain scale works."} Pain Type: Chronic pain Pain Location: Abdomen Pain Orientation: Right Pain Descriptors / Indicators: Burning, Pins and needles Pain Frequency: Constant  Post-procedure Pain Score: 0-No pain  Date of Last Visit: 09/28/15 Service Provided on Last Visit: Evaluation  Verification of the correct person, correct site (including marking of site), and correct procedure were performed and confirmed by the patient.  Today's Vitals   09/29/15 0937 09/29/15 1005 09/29/15 1010 09/29/15 1015  BP:  157/80 159/101 145/73  Pulse:  63 70 70  Temp:      Resp:  '16 16 18  ' Height:      Weight:      SpO2:  94% 93% 93%  PainSc: 4    0-No pain  Calculated BMI: Body mass index is 25.09 kg/(m^2). Allergies: He is allergic to codeine.. Primary  Diagnosis: Intercostal neuralgia [G54.8]  Procedure:  Type: {Blank single:19197::"Palliative","Neurolytic","Therapeutic","Diagnostic"} {Blank single:19197::"Posterior","Lateral","Anterior","Mid-Axillary Line","  "} Intercostal  {Blank single:19197::"Injection","Local Anesthetic-only Injection","Neurolysis","Neurolytic Block","Peri-Neural Injection","Radiofrequency Ablation","Steroid Injection","Nerve Block"} Region: {Blank single:19197::"Anterior","Posterior","Posterolateral","Lateral","  "} Thoracic Area Level: *** Ribs Laterality: {Blank single:19197::"Right","Right-Sided","Left","Left-Sided"}  Indications: 1. Intercostal neuralgia (Right)     In addition, 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  Consent: Secured. Under the influence of no sedatives a written informed consent was obtained, after having provided information on the risks and possible complications. To fulfill our ethical and legal obligations, as recommended by the American Medical Association's Code of Ethics, we have provided information to the patient about our clinical impression; the nature and purpose of the treatment or procedure; the risks, benefits, and possible complications of the intervention; alternatives; the risk(s) and benefit(s) of the alternative treatment(s) or procedure(s); and the risk(s) and benefit(s) of doing nothing. The patient was provided information about the risks and possible complications associated with the procedure. These include, but are not limited to, failure to achieve desired goals, infection, bleeding, organ or nerve damage, allergic reactions, paralysis, and death. I have also discussed the risk of lung puncture with subsequent difficulty breathing due to lung collapse, with or without pleural effusions, pneumothorax, hemothorax, chylothorax, hydrothorax,  empyema, and mediastinal shift leading to cardiovascular collapse and death. In addition, the patient was informed that Medicine is not an exact science; therefore, there is also the possibility of unforeseen risks and possible complications that may result in a catastrophic outcome. The patient indicated having understood very clearly. We have given the patient no guarantees and we have made no promises. Enough time  was given to the patient to ask questions, all of which were answered to the patient's satisfaction.  Consent Attestation: I, the ordering provider, attest that I have discussed with the patient the benefits, risks, side-effects, alternatives, likelihood of achieving goals, and potential problems during recovery for the procedure that I have provided informed consent.  Pre-Procedure Preparation: Safety Precautions: Allergies reviewed. Appropriate site, procedure, and patient were confirmed by following the Joint Commission's Universal Protocol ([id number].01.01.01), in the form of a "Time Out". The patient was asked to confirm marked site and procedure, before commencing. The patient was asked about blood thinners, or active infections, both of which were denied. Patient was assessed for positional comfort and all pressure points were checked before starting procedure. Monitoring:  As per clinic protocol. Infection Control Precautions: Sterile technique used. Standard Universal Precautions were taken as recommended by the Department of [facility] for Disease Control and Prevention (CDC). Standard pre-surgical skin prep was conducted. Respiratory hygiene and cough etiquette was practiced. Hand hygiene observed. Safe injection practices and needle disposal techniques followed. SDV (single dose vial) medications used. Medications properly checked for expiration dates and contaminants. Personal protective equipment (PPE) used: Sterile surgical gloves.  Anesthesia, Analgesia, Anxiolysis:  Type:  {Blank single:19197::"Local Anesthesia","Moderate (Conscious) Sedation & Local Anesthesia"} Local Anesthetic: Lidocaine 1% Route: {Blank single:19197::"Infiltration ([place]/IM)","Intravenous (IV)"} IV Access: {Blank single:19197::"Declined","Secured"} Sedation: {Blank single:19197::"Declined","Meaningful verbal contact was maintained at all times during the procedure"}  Indication(s): Analgesia {Blank single:19197::"& history of Stokes-Adams attack(s) (Vasovagal)","& trypanophobia, (Fear of Needles)","& Anxiolysis"," "}  Description of Procedure Process:  Time-out: "Time-out" completed before starting procedure, as per protocol. Position: {Blank single:19197::"Supine","Left Lateral Decubitus","Right Lateral Decubitus","Prone"} Target Area: The sub-costal neurovascular bundle area Approach: Sub-costal approach Area Prepped: {Blank single:19197::"Right","Left","Entire"} {Blank single:19197::"Anterior","Posterior","Lateral","Postero-lateral","Mid-Axillary line"} Thoracic {Blank single:19197::"Area","Region"} Prepping solution: {Blank single:19197::"Hibiclens (4.0% Chlorhexidine gluconate solution)","Duraprep (Iodine Povacrylex [0.7% available Iodine] and Isopropyl Alcohol, 74% w/w)","ChloraPrep (2% chlorhexidine gluconate and 70% isopropyl alcohol)"} Safety Precautions: Aspiration looking for blood return was conducted prior to all injections. At no point did we inject any substances, as a needle was being advanced. No attempts were made at seeking any paresthesias. Safe injection practices and needle disposal techniques used. Medications properly checked for expiration dates. SDV (single dose vial) medications used. {Blank multiple:19196:s:"Latex Allergy precautions taken.","Contrast Allergy precautions taken."," "} Description of the Procedure: Protocol guidelines were followed. The patient was placed in position over the procedure table. The target area was identified and the area prepped in the usual  manner. Skin & deeper tissues infiltrated with local anesthetic. Appropriate amount of time allowed to pass for local anesthetics to take effect. The procedure needles were then advanced to the target area. Proper needle placement secured. Negative aspiration confirmed. Solution injected in intermittent fashion, asking for systemic symptoms every 0.5cc of injectate. The needles were then removed and the area cleansed, making sure to leave some of the prepping solution back to take advantage of its long term bactericidal properties. -------------------------------- LESI: The procedure needle was introduced through the skin, ipsilateral to the reported pain, and advanced to the target area. Bone was contacted and the needle walked caudad, until the lamina was cleared. The epidural space was identified using "loss-of-resistance technique" with 2-3 ml of PF-NaCl (0.9% NSS), in a 5cc LOR glass syringe.  L-FCT: The procedure needle was introduced through the skin, ipsilateral to the reported pain, and advanced to the target area. Employing the "Medial Branch Technique", the procedure needles were introduced through the skin and advanced to the angle made by the  superior and medial portion of the transverse process, and the lateral and inferior portion of the superior articulating process of the targeted vertebral bodies. This area is known as "[place]'s Eye" or the "Eye of the [place] Dog". A procedure needle was introduced through the skin, and this time advanced to the angle made by the superior and medial border of the sacral ala, and the lateral border of the S1 vertebral body. This last needle was later repositioned at the superior and lateral border of the posterior S1 foramen.  CESI: Protocol guidelines were followed. The procedure needle was introduced through the skin, ipsilateral to the reported pain, and advanced to the target area. Bone was contacted and the needle walked caudad, until the lamina was  cleared. The epidural space was identified using "loss-of-resistance technique" with 2-3 ml of PF-NaCl (0.9% NSS), in a 5cc LOR glass syringe.  C-FCT: Protocol guidelines were followed. Local anesthesia infiltrated. The procedure needle was introduced through the skin, ipsilateral to the reported pain, and advanced to the target area. Bone was contacted on the posterior aspect of the articular pillars and the needle walked lateral, until the border was cleared. Lateral views taken to make sure the needle tip did not advance past the posterior third of the lateral mass of the posterior columns. The procedure was repeated in identical fashion for each level.The postero-lateral waist of the articular pillars at the C3, C4, C5, C6, & C7 levels  CF RF: Radiofrequency needles were used to reach the area of the medial branch at the waist of the articular pillars using fluoroscopy. Using the [employer], sensory stimulation using 50 Hz was used to locate & identify the nerve, making sure that the needle was positioned such that there was no sensory stimulation below 0.3 V or above 0.7 V. Stimulation using 2 Hz was used to evaluate the motor component. Care was taken not to lesion any nerves that demonstrated motor stimulation of the lower extremities at an output of less than 2.5 times that of the sensory threshold, or a maximum of 2.0 V. Once satisfactory placement of the needles was achieved, the above solution was slowly injected after negative aspiration. After waiting for at least 2 minutes, the ablation was performed at 80 degrees C for 60 seconds.  ESI: Bone was contacted and the needle walked caudad, until the lamina was cleared. The epidural space was identified with a "loss-of-resistance technique" using 0.9% PF-NSS (2-21m), in a low friction 5cc LOR syringe.  IT (Spinal): Bone was contacted and the needle walked caudad, until the lamina was cleared. The intrathecal space was  identified using gentle aspiration with a 3cc syringe, as I slowly advanced the needle. Once CSF was obtained, the needle advancement was halted and the syringe removed. Once I confirmed free flow of clear CSF, I then proceeded to inject the above solution. This was done after confirming free flow of CSF into the syringe, both, before injecting and after injection.  SI Blk: The procedure needle was advanced under fluoroscopic guidance into the sacroiliac joint until a firm endpoint was obtained.  LSB: A procedure needle was advanced to the superior anterolateral border of the L3 vertebral body, under pulsed fluoroscopic guidance. Care was taken not to advance the tip of the needle past the anterior border of the vertebral body, on the lateral fluoroscopic view.  LF RF: Radiofrequency needles were introduced to the area of the medial branch at the junction of the superior articular process and transverse process  using fluoroscopy. Using the [employer], sensory stimulation using 50 Hz was used to locate & identify the nerve, making sure that the needle was positioned such that there was no sensory stimulation below 0.3 V or above 0.7 V. Stimulation using 2 Hz was used to evaluate the motor component. Care was taken not to lesion any nerves that demonstrated motor stimulation of the lower extremities at an output of less than 2.5 times that of the sensory threshold, or a maximum of 2.0 V. Once satisfactory placement of the needles was achieved, the above solution was slowly injected after negative aspiration. After waiting for at least 2 minutes, the ablation was performed at 80 degrees C for 60 seconds.  LSB RF: Radiofrequency needles were introduced ipsilateral to the affected side, aiming at the anterolateral aspect of the L3 & L4 vertebral bodies, where the Lumbar Sympathetic Chain is located. Using the [employer], sensory stimulation using 50 Hz was used to  locate & identify the nerve, making sure that the needle was positioned such that there was no sensory stimulation to the groin area at any output, or sensory stimulation below 0.3 V or above 0.7 V for the lower extremity. Stimulation using 2 Hz was used to evaluate the motor component. Care was taken not to lesion any nerves that demonstrated motor stimulation of the lower extremities. Once satisfactory placement of the needles was achieved, the above solution was slowly injected after negative aspiration. After waiting for at least 2 minutes, the ablation was performed at 80 degrees C for 60 seconds.  MNB: Protocol guidelines were followed. The patient was placed in position. The trigger point was identified and marked. The area was prepped in the usual manner. The trigger point was held in position between my index and middle finger. The procedure needle was inserted and advanced to the target area. Proper needle placement secured. Negative aspiration confirmed. Solution injected in intermittent fashion, asking the patient for for systemic symptoms every 0.5cc of injectate. The needle was then removed and the area cleansed, making sure to leave some of the prepping solution back to take advantage of its long term bactericidal properties. --------------------------------  EBL: Minimal Materials & Medications Used:  Needle(s) Used: {Blank single:19197::"22g - 10cm, Teflon-coated, Radiofrequency needle(s)","22g - 1.5" Needle(s)","25g - 1.5" Needle(s)"} Solution Injected: {Blank single:19197::"0.2% PF-Ropivacaine (15m) + SDV-Triamcinolone 43mml (71m171m,"0.2% PF-Ropivacaine (9ml65m SDV-DepoMedrol 80 mg/ml (71ml)55m0.2% PF-Ropivacaine (9ml) 46mDV-Decadron 10 mg/ml (71ml)"}67mmaging Guidance:  Type of Imaging Technique: Fluoroscopy Guidance (Non-spinal) Indication(s): Assistance in needle guidance and placement for procedures requiring needle placement in or near specific anatomical locations not easily  accessible without such assistance. Exposure Time: Please see nurses notes. Contrast: None used. Fluoroscopic Guidance: I was personally present in the fluoroscopy suite, where the patient was placed in position for the procedure, over the fluoroscopy-compatible table. Parallax error was corrected before commencing the procedure. A "direction-depth-direction" technique was used to introduce the needle under continuous pulsed fluoroscopic guidance. Permanently recorded images stored by scanning into EMR. Interpretation: Intraoperative imaging interpretation by performing Physician. Adequate needle placement confirmed. Permanent hardcopy images in multiple planes scanned into the patient's record.  Antibiotic Prophylaxis:  Type:  Antibiotics Given (last 72 hours)    None    {Blank single:19197::"None.","No prophylactic antibiotics indicated.","No indications for antibiotic prophylaxis were identified.","Please see chart.","Oral: Amoxicillin 2 grams PO 1 hour before procedure.","IM/IV: Ampicillin 2 grams IM/IV within 30 min before procedure.","Oral (Allergy to Penicillin): Clindamycin 600 mg PO 1 hour before procedure.","Oral (Allergy  to Penicillin): Cephalexin 2 grams PO 1 hour before procedure.","Oral (Allergy to Penicillin): Clarithromycin 500 mg PO 1 hour before procedure.","IV: (Allergy to Penicillin): Clindamycin 600 mg IV within 30 min before procedure.","IV: (Allergy to Penicillin): Cefazolin 1 gram IM/IV within 30 min before procedure."," "} Indication(s): {Blank single:19197::"None reported.","Procedural Prophylaxis.","Surgical Prophylaxis.","Implant Prophylaxis.","Prosthetic cardiac valves (mechanical, bioprosthetic, stentless and homograft).","Previous endocarditis.","Complex cyanotic heart disease (single ventricle, transposition, tetralogy).","Surgically constructed systemic-pulmonary shunts or conduits.","Acquired valvular dysfunction (rheumatic, etc.).","Hypertrophic cardiomyopathy.","MVP  with MR and/or thickened leaflets.","Orthopedic Prosthesis of less than 6 months since implant.","No indications identified."}  Post-operative Assessment:  Complications: {Blank FBPZWC:58527::"POEU.","MPNTIRWERX.","VQMG reported.","None detected.","No heme,or paresthesias.","No immediate post-operative complications were observed.","No immediate post-procedural complications were observed.","No immediate post-treatment complications were observed."} Disposition: The patient was discharged home, once institutional criteria were met. Return to clinic in 2 weeks for follow-up evaluation and interpretation of results. The patient tolerated the entire procedure well. A repeat set of vitals were taken after the procedure and the patient was kept under observation following institutional policy, for this type of procedure. Post-procedural neurological assessment was performed, showing return to baseline, prior to discharge. The patient was provided with post-procedure discharge instructions, including a section on how to identify potential problems. Should any problems arise concerning this procedure, the patient was given instructions to immediately contact us, at any time, without hesitation. In any case, we plan to contact the patient by telephone for a follow-up status report regarding this interventional procedure. Comments:  {Blank single:19197::"No additional relevant information."}  Medications administered during this visit: Mr. Laquentin Loudermilk had no medications administered during this visit.  Future Appointments Date Time Provider [place]  10/14/2015 7:40 AM Milinda Pointer, MD [facility] None    Primary Care Physician: Drema Pry, DO Location: [facility] Note by: Kathlen Brunswick. Dossie Arbour, M.D, DABA, DABAPM, DABPM, DABIPP, FIPP  Disclaimer:  Medicine is not an exact science. The only guarantee in medicine is that nothing is guaranteed. It is important to  note that the decision to proceed with this intervention was based on the information collected from the patient. The Data and conclusions were drawn from the patient's questionnaire, the interview, and the physical examination. Because the information was provided in large part by the patient, it cannot be guaranteed that it has not been purposely or unconsciously manipulated. Every effort has been made to obtain as much relevant data as possible for this evaluation. It is important to note that the conclusions that lead to this procedure are derived in large part from the available data. Always take into account that the treatment will also be dependent on availability of resources and existing treatment guidelines, considered by other Pain Management Practitioners as being common knowledge and practice, at the time of the intervention. For Medico-Legal purposes, it is also important to point out that variation in procedural techniques and pharmacological choices are the acceptable norm. The indications, contraindications, technique, and results of the above procedure should only be interpreted and judged by a Board-Certified Interventional Pain Specialist with extensive familiarity and expertise in the same exact procedure and technique. Attempts at providing opinions without similar or greater experience and expertise than that of the treating physician will be considered as inappropriate and unethical, and shall result in a formal complaint to the state medical board and applicable specialty societies. Pain Score: We use the NRS-11 scale. This is a self-reported, subjective measurement of pain severity with only modest accuracy. It is used primarily to identify changes within a particular patient. It must be understood  that outpatient pain scales are significantly less accurate that those used for research, where they can be applied under ideal controlled circumstances with minimal exposure to variables. In  reality, the score is likely to be a combination of pain intensity and pain affect, where pain affect describes the degree of emotional arousal or changes in action readiness caused by the sensory experience of pain. Factors such as social and work situation, setting, emotional state, anxiety levels, expectation, and prior pain experience may influence pain perception and show large inter-individual differences that may also be affected by time variables.

## 2015-10-02 NOTE — Progress Notes (Signed)
Patient's Name: Stephen Leon MRN: [id number] DOB: [birth year] DOS: 09/29/2015  Primary Reason(s) for Visit: Interventional Pain Management Treatment. CC: Abdominal Pain   Pre-Procedure Assessment:  [name] is a 70 y.o. year old, male patient, seen today for interventional treatment. 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 His primarily concern today is the Abdominal Pain   Today's Initial Pain Score: 5/10 Reported level of pain is incompatible with clinical obrservations. This may be secondary to a possible lack of understanding on how the pain scale works. Pain Type: Chronic pain Pain Location: Abdomen Pain Orientation: Right Pain Descriptors / Indicators: Burning, Pins and needles Pain Frequency: Constant  Post-procedure Pain Score: 0-No pain  Date of Last Visit: 09/28/15 Service Provided on Last Visit: Evaluation  Verification of the correct person, correct site (including marking of site), and correct procedure were performed and confirmed by the patient.  Today's Vitals   09/29/15 0937 09/29/15 1005 09/29/15 1010 09/29/15 1015  BP:  157/80 159/101 145/73  Pulse:  63 70 70  Temp:      Resp:  '16 16 18  ' Height:      Weight:      SpO2:  94% 93% 93%  PainSc: 4    0-No pain  Calculated BMI: Body mass index is 25.09 kg/(m^2). Allergies: He is allergic to codeine.. Primary Diagnosis: Intercostal neuralgia [G54.8]  Procedure:  Type: Diagnostic Mid-Axillary Line Intercostal  Nerve Block Region: Lateral Thoracic Area Level: T10 and T11 Ribs Laterality:  Right-Sided  Indications: 1. Intercostal neuralgia (Right)     In addition, 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  Consent: Secured. Under the influence of no sedatives a written informed consent was obtained, after having provided information on the risks and possible complications. To fulfill our ethical and legal obligations, as recommended by the American Medical Association's Code of Ethics, we have provided information to the patient about our clinical impression; the nature and purpose of the treatment or procedure; the risks, benefits, and possible complications of the intervention; alternatives; the risk(s) and benefit(s) of the alternative treatment(s) or procedure(s); and the risk(s) and benefit(s) of doing nothing. The patient was provided information about the risks and possible complications associated with the procedure. These include, but are not limited to, failure to achieve desired goals, infection, bleeding, organ or nerve damage, allergic reactions, paralysis, and death. I have also discussed the risk of lung puncture with subsequent difficulty breathing due to lung collapse, with or without pleural effusions, pneumothorax, hemothorax, chylothorax, hydrothorax, empyema, and mediastinal shift leading to cardiovascular collapse and death. In addition, the patient was informed that Medicine is not an exact science; therefore, there is also the possibility of unforeseen risks and possible complications that may result in a catastrophic outcome. The patient indicated having understood very clearly. We have given the patient no guarantees and we have made no promises. Enough time was given to the patient to ask questions, all of which were answered to the patient's satisfaction.  Consent Attestation: I, the ordering provider, attest that I have discussed  with the patient the  benefits, risks, side-effects, alternatives, likelihood of achieving goals, and potential problems during recovery for the procedure that I have provided informed consent.  Pre-Procedure Preparation: Safety Precautions: Allergies reviewed. Appropriate site, procedure, and patient were confirmed by following the Joint Commission's Universal Protocol (UP.01.01.01), in the form of a "Time Out". The patient was asked to confirm marked site and procedure, before commencing. The patient was asked about blood thinners, or active infections, both of which were denied. Patient was assessed for positional comfort and all pressure points were checked before starting procedure. Monitoring:  As per clinic protocol. Infection Control Precautions: Sterile technique used. Standard Universal Precautions were taken as recommended by the Department of [facility] for Disease Control and Prevention (CDC). Standard pre-surgical skin prep was conducted. Respiratory hygiene and cough etiquette was practiced. Hand hygiene observed. Safe injection practices and needle disposal techniques followed. SDV (single dose vial) medications used. Medications properly checked for expiration dates and contaminants. Personal protective equipment (PPE) used: Sterile surgical gloves.  Anesthesia, Analgesia, Anxiolysis:  Type: Local Anesthesia Local Anesthetic: Lidocaine 1% Route: Infiltration ([place]/IM) IV Access: Declined Sedation: Declined  Indication(s): Analgesia    Description of Procedure Process:  Time-out: "Time-out" completed before starting procedure, as per protocol. Position: Left Lateral Decubitus Target Area: The sub-costal neurovascular bundle area Approach: Sub-costal approach Area Prepped: Entire Mid-Axillary line Thoracic Region Prepping solution: ChloraPrep (2% chlorhexidine gluconate and 70% isopropyl alcohol) Safety Precautions: Aspiration looking for blood return was conducted prior  to all injections. At no point did we inject any substances, as a needle was being advanced. No attempts were made at seeking any paresthesias. Safe injection practices and needle disposal techniques used. Medications properly checked for expiration dates. SDV (single dose vial) medications used.   Description of the Procedure: Protocol guidelines were followed. The patient was placed in position over the procedure table. The target area was identified and the area prepped in the usual manner. Skin & deeper tissues infiltrated with local anesthetic. After cleaning the skin with an antiseptic solution, 1-2 mL of dilute local anesthetic was infiltrated subcutaneously at the planned injection site. The fingers of the palpating hand were used to straddle the insertion site at the inferior border of the rib and fix the skin to avoid unwanted skin movement. Appropriate amount of time allowed to pass for local anesthetics to take effect. The procedure needles were then advanced to the target area at an angle of approximately 20 cephalad to the skin. Contact with the rib was made. While maintaining the same angle of insertion, the needle was walked off the inferior border of the rib as the skin was allowed to return to its initial position. Then the needle was advanced no more than 3 mm below the inferior margin of the rib. Proper needle placement was secured. Negative aspiration confirmed. Following negative aspiration for blood or air, 3-5 mL of local anesthetic was injected. The solution injected in intermittent fashion, asking for systemic symptoms every 0.5cc of injectate. The needle was then removed and the area cleansed, making sure to leave some of the prepping solution back to take advantage of its long term bactericidal properties. EBL: Minimal Materials & Medications Used:  Needle(s) Used: 25g - 1.5" Needle(s) Solution Injected: 0.2% PF-Ropivacaine (73m) + SDV-DepoMedrol 80 mg/ml (171m  Imaging Guidance:    Type of Imaging Technique: Fluoroscopy Guidance (Non-spinal) Indication(s): Assistance in needle guidance and placement for procedures requiring needle placement in or near specific anatomical locations not easily accessible without such assistance. Exposure  Time: Please see nurses notes. Contrast: None used. Fluoroscopic Guidance: I was personally present in the fluoroscopy suite, where the patient was placed in position for the procedure, over the fluoroscopy-compatible table. Parallax error was corrected before commencing the procedure. A "direction-depth-direction" technique was used to introduce the needle under continuous pulsed fluoroscopic guidance. Permanently recorded images stored by scanning into EMR. Interpretation: Intraoperative imaging interpretation by performing Physician. Adequate needle placement confirmed. Permanent hardcopy images in multiple planes scanned into the patient's record.  Antibiotic Prophylaxis:  Type:  Antibiotics Given (last 72 hours)    None      Indication(s): No indications identified.  Post-operative Assessment:  Complications: No immediate post-treatment complications were observed. Disposition: The patient was discharged home, once institutional criteria were met. Return to clinic in 2 weeks for follow-up evaluation and interpretation of results. The patient tolerated the entire procedure well. A repeat set of vitals were taken after the procedure and the patient was kept under observation following institutional policy, for this type of procedure. Post-procedural neurological assessment was performed, showing return to baseline, prior to discharge. The patient was provided with post-procedure discharge instructions, including a section on how to identify potential problems. Should any problems arise concerning this procedure, the patient was given instructions to immediately contact us, at any time, without hesitation. In any case, we plan to contact the  patient by telephone for a follow-up status report regarding this interventional procedure. Comments:  No additional relevant information.  Future Appointments Date Time Provider [place]  10/14/2015 7:40 AM Milinda Pointer, MD [facility] None    Primary Care Physician: Drema Pry, DO Location: [facility] Outpatient Pain Management Facility Note by: Kathlen Brunswick. Dossie Arbour, M.D, DABA, DABAPM, DABPM, DABIPP, FIPP    Disclaimer:  Medicine is not an exact science. The only guarantee in medicine is that nothing is guaranteed. It is important to note that the decision to proceed with this intervention was based on the information collected from the patient. The Data and conclusions were drawn from the patient's questionnaire, the interview, and the physical examination. Because the information was provided in large part by the patient, it cannot be guaranteed that it has not been purposely or unconsciously manipulated. Every effort has been made to obtain as much relevant data as possible for this evaluation. It is important to note that the conclusions that lead to this procedure are derived in large part from the available data. Always take into account that the treatment will also be dependent on availability of resources and existing treatment guidelines, considered by other Pain Management Practitioners as being common knowledge and practice, at the time of the intervention. For Medico-Legal purposes, it is also important to point out that variation in procedural techniques and pharmacological choices are the acceptable norm. The indications, contraindications, technique, and results of the above procedure should only be interpreted and judged by a Board-Certified Interventional Pain Specialist with extensive familiarity and expertise in the same exact procedure and technique. Attempts at providing opinions without similar or greater experience and expertise than that of the treating physician will be  considered as inappropriate and unethical, and shall result in a formal complaint to the state medical board and applicable specialty societies. Pain Score: We use the NRS-11 scale. This is a self-reported, subjective measurement of pain severity with only modest accuracy. It is used primarily to identify changes within a particular patient. It must be understood that outpatient pain scales are significantly less accurate that those used for research, where they  can be applied under ideal controlled circumstances with minimal exposure to variables. In reality, the score is likely to be a combination of pain intensity and pain affect, where pain affect describes the degree of emotional arousal or changes in action readiness caused by the sensory experience of pain. Factors such as social and work situation, setting, emotional state, anxiety levels, expectation, and prior pain experience may influence pain perception and show large inter-individual differences that may also be affected by time variables.

## 2015-10-14 ENCOUNTER — Encounter: Payer: Self-pay | Admitting: Pain Medicine

## 2015-10-14 ENCOUNTER — Ambulatory Visit: Payer: Commercial Managed Care - HMO | Attending: Pain Medicine | Admitting: Pain Medicine

## 2015-10-14 VITALS — BP 162/89 | HR 63 | Temp 98.7°F | Resp 16 | Ht 68.0 in | Wt 165.0 lb

## 2015-10-14 DIAGNOSIS — R05 Cough: Secondary | ICD-10-CM | POA: Diagnosis not present

## 2015-10-14 DIAGNOSIS — Z85118 Personal history of other malignant neoplasm of bronchus and lung: Secondary | ICD-10-CM | POA: Diagnosis not present

## 2015-10-14 DIAGNOSIS — E785 Hyperlipidemia, unspecified: Secondary | ICD-10-CM | POA: Diagnosis not present

## 2015-10-14 DIAGNOSIS — Z9889 Other specified postprocedural states: Secondary | ICD-10-CM | POA: Insufficient documentation

## 2015-10-14 DIAGNOSIS — G8929 Other chronic pain: Secondary | ICD-10-CM | POA: Insufficient documentation

## 2015-10-14 DIAGNOSIS — G588 Other specified mononeuropathies: Secondary | ICD-10-CM | POA: Insufficient documentation

## 2015-10-14 DIAGNOSIS — R911 Solitary pulmonary nodule: Secondary | ICD-10-CM | POA: Insufficient documentation

## 2015-10-14 DIAGNOSIS — G548 Other nerve root and plexus disorders: Secondary | ICD-10-CM | POA: Diagnosis not present

## 2015-10-14 DIAGNOSIS — Z87891 Personal history of nicotine dependence: Secondary | ICD-10-CM | POA: Diagnosis not present

## 2015-10-14 DIAGNOSIS — I10 Essential (primary) hypertension: Secondary | ICD-10-CM | POA: Diagnosis not present

## 2015-10-14 DIAGNOSIS — J449 Chronic obstructive pulmonary disease, unspecified: Secondary | ICD-10-CM | POA: Diagnosis not present

## 2015-10-14 NOTE — Progress Notes (Signed)
Safety precautions to be maintained throughout the outpatient stay will include: orient to surroundings, keep bed in low position, maintain call bell within reach at all times, provide assistance with transfer out of bed and ambulation.  

## 2015-10-14 NOTE — Progress Notes (Signed)
Patient's Name: Stephen Leon MRN: [id number] DOB: 03-07-1946 DOS: 10/14/2015  Primary Reason(s) for Visit: Post-Procedure evaluation CC: No chief complaint on file.   HPI  Stephen Leon is a 70 y.o. year old, male patient, who returns today as an established patient. 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 His primarily concern today is the No chief complaint on file.   The patient returns to the clinics today after having had a T10 and T11 intercostal nerve block on the right side, under fluoroscopic guidance and no sedation. He returns to the clinic today for postoperative evaluation. He has attained 100% relief of his pain and has not come back. At this point, we will see him on a when necessary basis should he need a repeat procedure. For now, he has attained complete resolution.  Pain Assessment: Self-Reported Pain Score: 0-No pain Reported level is compatible with observation Pain Type: Chronic pain Pain Location: Abdomen Pain Orientation: Right  Date of Last Visit: 09/29/15 Service Provided on Last Visit: Procedure (right ICNB)  Controlled Substance Pharmacotherapy Assessment  Analgesic: The patient is currently doing well and not receiving any controlled substances from [place].  Risk Assessment: Aberrant Behavior: None observed today Substance Use Disorder (SUD) Risk Level: Low Opioid Risk Tool (ORT) Score:      Depression Scale Score: PHQ-2: PHQ-2 Total Score: 0 No depression (0) PHQ-9: PHQ-9 Total Score: 0 No depression (0-4)  Pharmacologic Plan: No change in  therapy, at this time   Laboratory Workup  Last ED UDS: No results found for: THCU, COCAINSCRNUR, PCPSCRNUR, MDMA, AMPHETMU, METHADONE, ETOH  Inflammation Markers No results found for: ESRSEDRATE, CRP  Renal Function Lab Results  Component Value Date   BUN 11 07/06/2007   CREATININE 1.10 08/24/2015   GFRAA 127 07/06/2007   GFRNONAA 105 07/06/2007    Hepatic Function Lab Results  Component Value Date   AST 20 07/06/2007   ALT 17 07/06/2007   ALBUMIN 3.9 07/06/2007    Electrolytes Lab Results  Component Value Date   NA 141 07/06/2007   K 4.5 07/06/2007   CL 104 07/06/2007   CALCIUM 9.2 07/06/2007    Post-Procedure Assessment  Procedure done on last visit: The patient returns to the clinics today after having had a T10 and T11 intercostal nerve block on the right side, under fluoroscopic guidance and no sedation. Side-effects or Adverse reactions: None reported Sedation: No sedation used  Results: Ultra-Short Term Relief (First 1 hour after procedure): 100 %  Analgesia during this period is likely to be Local Anesthetic and/or IV Sedative (Analgesic/Anxiolitic) related Short Term Relief (Initial 4-6 hrs after procedure): 100 % Complete relief confirms area to be the source of pain Long Term Relief : 100 % Long-term benefit would suggest an inflammatory etiology to the pain   Current Relief (Now):  100%  Persistent relief would suggest effective anti-inflammatory effects from steroids Interpretation of Results: Clearly this has worked very well for him and should the pain returned we'll repeat it.  Allergies  Mr. kalieb Leon is allergic to codeine.  Meds  The patient has a current medication list which includes the following prescription(s): albuterol and gabapentin.  Current Outpatient Prescriptions on  File Prior to Visit  Medication Sig  . albuterol (PROAIR HFA) 108 (90 Base) MCG/ACT inhaler Inhale into the lungs.  . gabapentin (NEURONTIN) 400 MG capsule  Take 400 mg by mouth 3 (three) times daily. Reported on 09/28/2015   No current facility-administered medications on file prior to visit.    ROS  Constitutional: Afebrile, no chills, well hydrated and well nourished Gastrointestinal: negative Musculoskeletal:negative Neurological: negative Behavioral/Psych: negative  PFSH  Medical:  Stephen Leon  has a past medical history of Cancer ([place]) and Hypertension. Family: family history includes COPD in his mother. Surgical:  has past surgical history that includes Hernia repair. Tobacco:  reports that he has quit smoking. He does not have any smokeless tobacco history on file. Alcohol:  reports that he drinks about 7.2 oz of alcohol per week. Drug:  reports that he does not use illicit drugs.  Physical Exam  Vitals:  Today's Vitals   10/14/15 0756 10/14/15 0757  BP:  162/89  Pulse: 63   Temp: 98.7 F (37.1 C)   Resp: 16   Height: '5\' 8"'$  (1.727 m)   Weight: 165 lb (74.844 kg)   SpO2: 99%   PainSc: 0-No pain 0-No pain  PainLoc: Abdomen     Calculated BMI: Body mass index is 25.09 kg/(m^2).     General appearance: alert, cooperative, appears stated age and no distress Eyes: PERLA Respiratory: No evidence respiratory distress, no audible rales or ronchi and no use of accessory muscles of respiration  Thoracic Spine Inspection: No gross anomalies detected Alignment: Symetrical ROM: Adequate   Assessment & Plan  Primary Diagnosis & Pertinent Problem List: The encounter diagnosis was Intercostal neuralgia (T10 & T11) (Right).  Visit Diagnosis: 1. Intercostal neuralgia (T10 & T11) (Right)     Problem-specific Plan(s): No problem-specific assessment & plan notes found for this encounter.   Plan of Care  Pharmacotherapy (Medications Ordered): No orders of the defined types were placed in this encounter.    Lab-work & Procedure Ordered: Orders Placed This Encounter  Procedures  . INTERCOSTAL NERVE BLOCK     Standing Status: Standing     Number of Occurrences: 1     Standing Expiration Date: 10/13/2016    Scheduling Instructions:     Right sided, T10 and T11 intercostal nerve block under fluoroscopic guidance, no sedation.    Order Specific Question:  Where will this procedure be performed?    Answer:  [facility]    Imaging Ordered: None  Interventional Therapies: Scheduled: None at this time. PRN Procedures: Right sided, T10 and T11 intercostal nerve block under fluoroscopic guidance and no IV sedation.    Referral(s) or Consult(s): None at this time.  Medications administered during this visit: Mr. Jaquon Gingerich had no medications administered during this visit.  No future appointments.  Primary Care Physician: Drema Pry, DO Location: [facility] Outpatient Pain Management Facility Note by: Kathlen [place]. Dossie Arbour, M.D, DABA, DABAPM, DABPM, DABIPP, FIPP  Pain Score Disclaimer: We use the NRS-11 scale. This is a self-reported, subjective measurement of pain severity with only modest accuracy. It is used primarily to identify changes within a particular patient. It must be understood that outpatient pain scales are significantly less accurate that those used for research, where they can be applied under ideal controlled circumstances with minimal exposure to variables. In reality, the score is likely to be a combination of pain intensity and pain affect, where pain affect describes the degree of emotional arousal or changes in action readiness  caused by the sensory experience of pain. Factors such as social and work situation, setting, emotional state, anxiety levels, expectation, and prior pain experience may influence pain perception and show large inter-individual differences that may also be affected by time variables.

## 2015-12-08 DIAGNOSIS — I10 Essential (primary) hypertension: Secondary | ICD-10-CM | POA: Diagnosis not present

## 2015-12-08 DIAGNOSIS — Z23 Encounter for immunization: Secondary | ICD-10-CM | POA: Diagnosis not present

## 2015-12-08 DIAGNOSIS — J410 Simple chronic bronchitis: Secondary | ICD-10-CM | POA: Diagnosis not present

## 2015-12-08 DIAGNOSIS — C801 Malignant (primary) neoplasm, unspecified: Secondary | ICD-10-CM | POA: Diagnosis not present

## 2015-12-08 DIAGNOSIS — M792 Neuralgia and neuritis, unspecified: Secondary | ICD-10-CM | POA: Diagnosis not present

## 2015-12-15 DIAGNOSIS — M792 Neuralgia and neuritis, unspecified: Secondary | ICD-10-CM | POA: Diagnosis not present

## 2015-12-15 DIAGNOSIS — Z720 Tobacco use: Secondary | ICD-10-CM | POA: Diagnosis not present

## 2015-12-15 DIAGNOSIS — J410 Simple chronic bronchitis: Secondary | ICD-10-CM | POA: Diagnosis not present

## 2015-12-15 DIAGNOSIS — F411 Generalized anxiety disorder: Secondary | ICD-10-CM | POA: Insufficient documentation

## 2015-12-15 DIAGNOSIS — C801 Malignant (primary) neoplasm, unspecified: Secondary | ICD-10-CM | POA: Diagnosis not present

## 2016-03-22 DIAGNOSIS — F411 Generalized anxiety disorder: Secondary | ICD-10-CM | POA: Diagnosis not present

## 2016-03-22 DIAGNOSIS — M792 Neuralgia and neuritis, unspecified: Secondary | ICD-10-CM | POA: Diagnosis not present

## 2016-03-22 DIAGNOSIS — J440 Chronic obstructive pulmonary disease with acute lower respiratory infection: Secondary | ICD-10-CM | POA: Diagnosis not present

## 2016-03-22 DIAGNOSIS — J449 Chronic obstructive pulmonary disease, unspecified: Secondary | ICD-10-CM | POA: Diagnosis not present

## 2016-03-22 DIAGNOSIS — R399 Unspecified symptoms and signs involving the genitourinary system: Secondary | ICD-10-CM | POA: Diagnosis not present

## 2016-03-22 DIAGNOSIS — I10 Essential (primary) hypertension: Secondary | ICD-10-CM | POA: Diagnosis not present

## 2016-03-22 DIAGNOSIS — Z720 Tobacco use: Secondary | ICD-10-CM | POA: Diagnosis not present

## 2016-03-22 DIAGNOSIS — J439 Emphysema, unspecified: Secondary | ICD-10-CM | POA: Diagnosis not present

## 2016-03-31 DIAGNOSIS — I10 Essential (primary) hypertension: Secondary | ICD-10-CM | POA: Diagnosis not present

## 2016-03-31 DIAGNOSIS — Z720 Tobacco use: Secondary | ICD-10-CM | POA: Diagnosis not present

## 2016-03-31 DIAGNOSIS — J410 Simple chronic bronchitis: Secondary | ICD-10-CM | POA: Diagnosis not present

## 2016-03-31 DIAGNOSIS — J181 Lobar pneumonia, unspecified organism: Secondary | ICD-10-CM | POA: Diagnosis not present

## 2016-03-31 DIAGNOSIS — F411 Generalized anxiety disorder: Secondary | ICD-10-CM | POA: Diagnosis not present

## 2016-03-31 DIAGNOSIS — M792 Neuralgia and neuritis, unspecified: Secondary | ICD-10-CM | POA: Diagnosis not present

## 2016-04-05 ENCOUNTER — Encounter: Payer: Self-pay | Admitting: Pain Medicine

## 2016-04-05 ENCOUNTER — Ambulatory Visit: Payer: Commercial Managed Care - HMO | Attending: Pain Medicine | Admitting: Pain Medicine

## 2016-04-05 DIAGNOSIS — G588 Other specified mononeuropathies: Secondary | ICD-10-CM

## 2016-04-05 DIAGNOSIS — G548 Other nerve root and plexus disorders: Secondary | ICD-10-CM | POA: Diagnosis not present

## 2016-04-05 NOTE — Progress Notes (Signed)
Patient's Name: Stephen Leon  Patient type: Established  MRN: [id number]  Service setting: Ambulatory outpatient  DOB: [birth year]  Location: [facility]  DOS: 04/05/2016  Primary Care Physician: Drema Pry, DO  Note by: Kathlen Brunswick. Dossie Arbour, M.D, DABA, DABAPM, DABPM, DABIPP, FIPP  Referring Physician: Milinda Pointer, MD  Specialty: Board-Certified Interventional Pain Management  Last Visit to Pain Management: 10/14/2015   Primary Reason(s) for Visit: Evaluation of chronic illnesses with exacerbation, progression, or problem with treatment (Level of risk: moderate) CC: Abdominal Pain (right upper )   HPI  Stephen Leon is a 70 y.o. year old, male patient, who returns today as an established patient. 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 His primarily concern today is the Abdominal Pain (right upper )   Pain Assessment: Self-Reported Pain Score: 5              Reported level is compatible with observation       Pain Type: Chronic pain Pain Location: Abdomen Pain Orientation: Right, Upper Pain Descriptors / Indicators: Aching (gripping) Pain Frequency: Constant  The patient comes into the clinics today for post-procedure evaluation on the interventional treatment done on 10/14/2015. In addition, he comes in today for pharmacological management of his chronic pain.  The patient  reports that he does not use drugs. The patient comes in today clinics today for a right-sided T10 and T11 intercostal nerve block.  Unfortunately, he has decided that he wants to do it with sedation and did not keep his nothing by mouth status. We have rescheduled him to come back in one week for the procedure. Today we took the opportunity to explain to him the risks and possible complications associated none only with the procedure, but also with the steroids. He understood and accepted.  Date of Last Visit: 10/14/15 Service Provided on Last Visit: Evaluation  Controlled Substance Pharmacotherapy Assessment & REMS (Risk Evaluation and Mitigation Strategy)  Currently not using any pain medication from our practice.  Laboratory Chemistry  Inflammation Markers No results found for: ESRSEDRATE, CRP  Renal Function Lab Results  Component Value Date   BUN 11 07/06/2007   CREATININE 1.10 08/24/2015   GFRAA 127 07/06/2007   GFRNONAA 105 07/06/2007    Hepatic Function Lab Results  Component Value Date   AST 20 07/06/2007   ALT 17 07/06/2007   ALBUMIN 3.9 07/06/2007    Electrolytes Lab Results  Component Value Date   NA 141 07/06/2007   K 4.5 07/06/2007   CL 104 07/06/2007   CALCIUM 9.2 07/06/2007    Pain Modulating Vitamins No results found for: Maralyn Sago TM196QI2LNL, [id number], [id number], 25OHVITD1, 25OHVITD2, 25OHVITD3, VITAMINB12  Coagulation Parameters Lab Results  Component Value Date   PLT 296 07/06/2007    Cardiovascular Lab Results  Component Value Date   HGB 14.3 07/06/2007   HCT 41.0 07/06/2007    Note: Lab results reviewed.  Recent Diagnostic Imaging  Ct Chest W Contrast  Result Date: 08/24/2015 CLINICAL DATA:  Right upper quadrant abdominal pain. Prior hernia repair. A right upper lobectomy for lung cancer 3 years ago. Right rib and right upper quadrant pain and swelling. EXAM:  CT CHEST, ABDOMEN, AND PELVIS WITH CONTRAST TECHNIQUE: Multidetector CT imaging of the chest, abdomen and pelvis was performed following the standard protocol during bolus administration of intravenous  contrast. CONTRAST:  155m OMNIPAQUE IOHEXOL 300 MG/ML  SOLN COMPARISON:  PET-CT dated 05/02/2012 FINDINGS: CT CHEST FINDINGS Mediastinum/Nodes: Right lower paratracheal node 1.0 cm in short axis, image 20 series 2, formerly the same in formerly not hypermetabolic. Coronary, aortic arch, and branch vessel atherosclerotic vascular disease. Lungs/Pleura: Right upper lobectomy. 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 These were present and not hypermetabolic previously. No evidence of peripheral fibrosis in the lungs. Centrilobular emphysema. Musculoskeletal: No discrete rib lesion identified. CT ABDOMEN PELVIS FINDINGS Hepatobiliary: 2.5 by 2.2 cm enhancing mass in segment 2 of the liver, previously 3.1 by 2.1 cm when assessed on prior MRI of 03/23/2012, most conspicuous on the arterial phase images of that exam, and not hypermetabolic on the prior PET-CT dated 05/02/2012, with mildly increased T2 signal, potentially an atypical limb angioma or focal nodular hyperplasia. Gallbladder unremarkable. Pancreas: Unremarkable Spleen: Unremarkable Adrenals/Urinary Tract: Unremarkable Stomach/Bowel: Scattered sigmoid colon diverticula. Vascular/Lymphatic: Aortoiliac atherosclerotic vascular disease. Reproductive: Unremarkable Other: No supplemental non-categorized findings. Musculoskeletal: Age-indeterminate superior endplate compression fracture at L2. Not present in 2013. Left-sided iliopsoas bursitis. IMPRESSION: 1. No findings of recurrent malignancy. 2. There is a borderline sized right lower paratracheal lymph node that this was also previously of similar size and not hypermetabolic on the prior PET-CT of 2013. 3. Right upper lobectomy. 4. Centrilobular emphysema. 5. Left-sided iliopsoas bursitis. 6. Age-indeterminate superior endplate compression fracture at L2, likely benign. 7. Coronary, aortic arch, and branch vessel atherosclerotic vascular disease. Aortoiliac atherosclerotic vascular  disease. 8. Slightly reduced size of the enhancing mass in segment 2 of the liver, although the difference in size may simply be due to differences in conspicuity between CT and MRI. This was not hypermetabolic on prior PET-CT and is most likely an atypical hemangioma or focal nodular hyperplasia. 9. Scattered sigmoid colon diverticula. 10. 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 These results will be called to the ordering clinician or representative by the Radiologist Assistant, and communication documented in the PACS or zVision Dashboard. Electronically Signed   By: WVan ClinesM.D.   On: 08/24/2015 12:43   Ct Abdomen Pelvis W Contrast  Result Date: 08/24/2015 CLINICAL DATA:  Right upper quadrant abdominal pain. Prior hernia repair. A right upper lobectomy for lung cancer 3 years ago. Right rib and right upper quadrant pain and swelling. EXAM: CT CHEST, ABDOMEN, AND PELVIS WITH CONTRAST TECHNIQUE: Multidetector CT imaging of the chest, abdomen and pelvis was performed following the standard protocol during bolus administration of intravenous contrast. CONTRAST:  1015mOMNIPAQUE IOHEXOL 300 MG/ML  SOLN COMPARISON:  PET-CT dated 05/02/2012 FINDINGS: CT CHEST FINDINGS Mediastinum/Nodes: Right lower paratracheal node 1.0 cm in short axis, image 20 series 2, formerly the same in formerly not hypermetabolic. Coronary, aortic arch, and branch vessel atherosclerotic vascular disease. Lungs/Pleura: Right upper lobectomy. 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 These were present and not hypermetabolic previously. No evidence of peripheral fibrosis in the lungs. Centrilobular emphysema. Musculoskeletal: No discrete rib lesion identified. CT ABDOMEN PELVIS FINDINGS Hepatobiliary: 2.5 by 2.2 cm enhancing mass in segment 2 of the liver, previously 3.1 by 2.1 cm when assessed on prior MRI of 03/23/2012, most conspicuous on the arterial phase images of  that exam, and not hypermetabolic on the prior PET-CT dated 05/02/2012, with mildly increased T2 signal, potentially an atypical limb  angioma or focal nodular hyperplasia. Gallbladder unremarkable. Pancreas: Unremarkable Spleen: Unremarkable Adrenals/Urinary Tract: Unremarkable Stomach/Bowel: Scattered sigmoid colon diverticula. Vascular/Lymphatic: Aortoiliac atherosclerotic vascular disease. Reproductive: Unremarkable Other: No supplemental non-categorized findings. Musculoskeletal: Age-indeterminate superior endplate compression fracture at L2. Not present in 2013. Left-sided iliopsoas bursitis. IMPRESSION: 1. No findings of recurrent malignancy. 2. There is a borderline sized right lower paratracheal lymph node that this was also previously of similar size and not hypermetabolic on the prior PET-CT of 2013. 3. Right upper lobectomy. 4. Centrilobular emphysema. 5. Left-sided iliopsoas bursitis. 6. Age-indeterminate superior endplate compression fracture at L2, likely benign. 7. Coronary, aortic arch, and branch vessel atherosclerotic vascular disease. Aortoiliac atherosclerotic vascular disease. 8. Slightly reduced size of the enhancing mass in segment 2 of the liver, although the difference in size may simply be due to differences in conspicuity between CT and MRI. This was not hypermetabolic on prior PET-CT and is most likely an atypical hemangioma or focal nodular hyperplasia. 9. Scattered sigmoid colon diverticula. 10. 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 These results will be called to the ordering clinician or representative by the Radiologist Assistant, and communication documented in the PACS or zVision Dashboard. Electronically Signed   By: Van Clines M.D.   On: 08/24/2015 12:43    Meds  The patient has a current medication list which includes the following prescription(s): albuterol, alprazolam, gabapentin, tamsulosin, and tramadol.  Current Outpatient  Prescriptions on File Prior to Visit  Medication Sig  . gabapentin (NEURONTIN) 400 MG capsule Take 400 mg by mouth 3 (three) times daily. Reported on 09/28/2015   No current facility-administered medications on file prior to visit.     ROS  Constitutional: Denies any fever or chills Gastrointestinal: No reported hemesis, hematochezia, vomiting, or acute GI distress Musculoskeletal: Denies any acute onset joint swelling, redness, loss of ROM, or weakness Neurological: No reported episodes of acute onset apraxia, aphasia, dysarthria, agnosia, amnesia, paralysis, loss of coordination, or loss of consciousness  Allergies  Stephen Leon is allergic to codeine.  [place]  Medical:  Stephen Leon  has a past medical history of Cancer [facility]) and Hypertension. Family: family history includes COPD in his mother. Surgical:  has a past surgical history that includes Hernia repair. Tobacco:  reports that he has quit smoking. He smoked 1.00 pack per day. He has never used smokeless tobacco. Alcohol:  reports that he drinks about 7.2 oz of alcohol per week . Drug:  reports that he does not use drugs.  Constitutional Exam  Vitals: Blood pressure (!) 143/86, pulse 76, temperature 98.4 F (36.9 C), resp. rate 16, height '5\' 8"'$  (1.727 m), weight 165 lb (74.8 kg), SpO2 91 %. General appearance: Well nourished, well developed, and well hydrated. In no acute distress Calculated BMI/Body habitus: Body mass index is 25.09 kg/m. (25-29.9 kg/m2) Overweight - 20% higher incidence of chronic pain Psych/Mental status: Alert and oriented x 3 (person, place, & time) Eyes: PERLA Respiratory: No evidence of acute respiratory distress  Cervical Spine Exam  Inspection: No masses, redness, or swelling Alignment: Symmetrical Functional ROM: ROM appears unrestricted Stability: No instability detected Muscle strength & Tone: Functionally intact Sensory: Unimpaired Palpation: Non-contributory  Upper  Extremity (UE) Exam    Side: Right upper extremity  Side: Left upper extremity  Inspection: No masses, redness, swelling, or asymmetry  Inspection: No masses, redness, swelling, or asymmetry  Functional ROM: ROM appears unrestricted  Functional ROM: ROM appears unrestricted  Muscle strength & Tone: Functionally intact  Muscle  strength & Tone: Functionally intact  Sensory: Unimpaired  Sensory: Unimpaired  Palpation: Non-contributory  Palpation: Non-contributory   Thoracic Spine Exam  Inspection: No masses, redness, or swelling Alignment: Symmetrical Functional ROM: ROM appears unrestricted Stability: No instability detected Sensory: Pain pattern appears Neurogenic Muscle strength & Tone: Functionally intact Palpation: Complains of area being tender to palpation  Lumbar Spine Exam  Inspection: No masses, redness, or swelling Alignment: Symmetrical Functional ROM: ROM appears unrestricted Stability: No instability detected Muscle strength & Tone: Functionally intact Sensory: Unimpaired Palpation: Non-contributory Provocative Tests: Lumbar Hyperextension and rotation test: evaluation deferred today       Patrick's Maneuver: evaluation deferred today              Gait & Posture Assessment  Ambulation: Unassisted Gait: Relatively normal for age and body habitus Posture: WNL   Lower Extremity Exam    Side: Right lower extremity  Side: Left lower extremity  Inspection: No masses, redness, swelling, or asymmetry  Inspection: No masses, redness, swelling, or asymmetry  Functional ROM: ROM appears unrestricted  Functional ROM: ROM appears unrestricted  Muscle strength & Tone: Functionally intact  Muscle strength & Tone: Functionally intact  Sensory: Unimpaired  Sensory: Unimpaired  Palpation: Non-contributory  Palpation: Non-contributory    Assessment & Plan  Primary Diagnosis & Pertinent Problem List: The encounter diagnosis was Intercostal neuralgia (T10 & T11) (Right).  Visit  Diagnosis: 1. Intercostal neuralgia (T10 & T11) (Right)     Problems updated and reviewed during this visit: Problem  Gad (Generalized Anxiety Disorder)  Copd (Chronic Obstructive Pulmonary Disease) ([facility])  Tobacco User    Problem-specific Plan(s): No problem-specific Assessment & Plan notes found for this encounter.  No new Assessment & Plan notes have been filed under this hospital service since the last note was generated. Service: Pain Management   Plan of Care   Problem List Items Addressed This Visit      High   Intercostal neuralgia (T10 & T11) (Right) (Chronic)   Relevant Medications   ALPRAZolam (XANAX) 0.25 MG tablet   Other Relevant Orders   INTERCOSTAL NERVE BLOCK    Other Visit Diagnoses   None.      Pharmacotherapy (Medications Ordered): No orders of the defined types were placed in this encounter.   Lab-work & Procedure Ordered: Orders Placed This Encounter  Procedures  . INTERCOSTAL NERVE BLOCK    Imaging Ordered: None  Interventional Therapies: Scheduled:  Right-sided T10 and T11 intercostal nerve blocks under fluoroscopic guidance and IV sedation.    Considering:   Right-sided T10 and T11 intercostal nerve blocks under fluoroscopic guidance and IV sedation.  Possible radiofrequency ablation of the T10 and T11 intercostal nerves.    PRN Procedures:  Palliative Right-sided T10 and T11 intercostal nerve blocks under fluoroscopic guidance and IV sedation.    Referral(s) or Consult(s): None at this time.  New Prescriptions   No medications on file    Medications administered during this visit: Stephen Leon had no medications administered during this visit.  Requested PM Follow-up: Return in 7 days (on 04/12/2016) for Schedule Procedure.  No future appointments.  Primary Care Physician: Drema Pry, DO Location: [facility] Note by: Kathlen Brunswick. Dossie Arbour, M.D, DABA, DABAPM, DABPM, DABIPP, FIPP  Pain  Score Disclaimer: We use the NRS-11 scale. This is a self-reported, subjective measurement of pain severity with only modest accuracy. It is used primarily to identify changes within a particular patient. It must be understood that outpatient pain scales  are significantly less accurate that those used for research, where they can be applied under ideal controlled circumstances with minimal exposure to variables. In reality, the score is likely to be a combination of pain intensity and pain affect, where pain affect describes the degree of emotional arousal or changes in action readiness caused by the sensory experience of pain. Factors such as social and work situation, setting, emotional state, anxiety levels, expectation, and prior pain experience may influence pain perception and show large inter-individual differences that may also be affected by time variables.  Patient instructions provided at this appointment:: There are no Patient Instructions on file for this visit.

## 2016-04-06 ENCOUNTER — Telehealth: Payer: Self-pay | Admitting: *Deleted

## 2016-04-06 NOTE — Telephone Encounter (Signed)
Denies complications post procedure. 

## 2016-04-12 ENCOUNTER — Ambulatory Visit: Payer: Commercial Managed Care - HMO | Attending: Pain Medicine | Admitting: Pain Medicine

## 2016-04-12 ENCOUNTER — Encounter: Payer: Self-pay | Admitting: Pain Medicine

## 2016-04-12 VITALS — BP 141/81 | HR 62 | Temp 97.6°F | Resp 16 | Ht 68.0 in | Wt 165.0 lb

## 2016-04-12 DIAGNOSIS — G8929 Other chronic pain: Secondary | ICD-10-CM | POA: Diagnosis not present

## 2016-04-12 DIAGNOSIS — Z9889 Other specified postprocedural states: Secondary | ICD-10-CM | POA: Insufficient documentation

## 2016-04-12 DIAGNOSIS — J209 Acute bronchitis, unspecified: Secondary | ICD-10-CM | POA: Diagnosis not present

## 2016-04-12 DIAGNOSIS — C3490 Malignant neoplasm of unspecified part of unspecified bronchus or lung: Secondary | ICD-10-CM | POA: Insufficient documentation

## 2016-04-12 DIAGNOSIS — R05 Cough: Secondary | ICD-10-CM | POA: Diagnosis not present

## 2016-04-12 DIAGNOSIS — F411 Generalized anxiety disorder: Secondary | ICD-10-CM | POA: Diagnosis not present

## 2016-04-12 DIAGNOSIS — F17200 Nicotine dependence, unspecified, uncomplicated: Secondary | ICD-10-CM | POA: Insufficient documentation

## 2016-04-12 DIAGNOSIS — R1011 Right upper quadrant pain: Secondary | ICD-10-CM | POA: Insufficient documentation

## 2016-04-12 DIAGNOSIS — R911 Solitary pulmonary nodule: Secondary | ICD-10-CM | POA: Insufficient documentation

## 2016-04-12 DIAGNOSIS — G588 Other specified mononeuropathies: Secondary | ICD-10-CM | POA: Insufficient documentation

## 2016-04-12 DIAGNOSIS — E785 Hyperlipidemia, unspecified: Secondary | ICD-10-CM | POA: Insufficient documentation

## 2016-04-12 DIAGNOSIS — I10 Essential (primary) hypertension: Secondary | ICD-10-CM | POA: Diagnosis not present

## 2016-04-12 DIAGNOSIS — J440 Chronic obstructive pulmonary disease with acute lower respiratory infection: Secondary | ICD-10-CM | POA: Diagnosis not present

## 2016-04-12 DIAGNOSIS — G548 Other nerve root and plexus disorders: Secondary | ICD-10-CM | POA: Diagnosis present

## 2016-04-12 MED ORDER — MIDAZOLAM HCL 5 MG/5ML IJ SOLN
1.0000 mg | INTRAMUSCULAR | Status: DC | PRN
  Filled 2016-04-12: qty 5

## 2016-04-12 MED ORDER — METHYLPREDNISOLONE ACETATE 80 MG/ML IJ SUSP: 80.0000 mg | Freq: Once | INTRAMUSCULAR | Status: DC

## 2016-04-12 MED ORDER — LIDOCAINE HCL (PF) 1 % IJ SOLN
10.0000 mL | Freq: Once | INTRAMUSCULAR | Status: AC
  Administered 2016-04-12: 10 mL

## 2016-04-12 MED ORDER — FENTANYL CITRATE (PF) 100 MCG/2ML IJ SOLN
25.0000 ug | INTRAMUSCULAR | Status: DC | PRN
Start: 2016-04-12 — End: 2016-05-12
  Filled 2016-04-12: qty 2

## 2016-04-12 MED ORDER — DEXAMETHASONE SODIUM PHOSPHATE 10 MG/ML IJ SOLN
10.0000 mg | Freq: Once | INTRAMUSCULAR | Status: AC
  Administered 2016-04-12: 10 mg
  Filled 2016-04-12: qty 1

## 2016-04-12 MED ORDER — ROPIVACAINE HCL 2 MG/ML IJ SOLN
9.0000 mL | Freq: Once | INTRAMUSCULAR | Status: AC
  Administered 2016-04-12: 9 mL
  Filled 2016-04-12: qty 10

## 2016-04-12 MED ORDER — LACTATED RINGERS IV SOLN: 1000.0000 mL | Freq: Once | INTRAVENOUS | Status: DC

## 2016-04-12 NOTE — Progress Notes (Signed)
1337 Versed 2 mg IV, Fentanyl 50 mcg IV

## 2016-04-12 NOTE — Patient Instructions (Addendum)
Pain Management Discharge Instructions  General Discharge Instructions :  If you need to reach your doctor call: Monday-Friday 8:00 am - 4:00 pm at [phone] or toll free [phone].  After clinic hours [phone] to have operator reach doctor.  Bring all of your medication bottles to all your appointments in the pain clinic.  To cancel or reschedule your appointment with Pain Management please remember to call 24 hours in advance to avoid a fee.  Refer to the educational materials which you have been given on: General Risks, I had my Procedure. Discharge Instructions, Post Sedation.  Post Procedure Instructions:  The drugs you were given will stay in your system until tomorrow, so for the next 24 hours you should not drive, make any legal decisions or drink any alcoholic beverages.  You may eat anything you prefer, but it is better to start with liquids then soups and crackers, and gradually work up to solid foods.  Please notify your doctor immediately if you have any unusual bleeding, trouble breathing or pain that is not related to your normal pain.  Depending on the type of procedure that was done, some parts of your body may feel week and/or numb.  This usually clears up by tonight or the next day.  Walk with the use of an assistive device or accompanied by an adult for the 24 hours.  You may use ice on the affected area for the first 24 hours.  Put ice in a Ziploc bag and cover with a towel and place against area 15 minutes on 15 minutes off.  You may switch to heat after 24 hours.Intercostal Nerve Block Patient Information  Description: The twelve intercostal nerves arise from the first thru twelfth thoracic nerve roots.  The nerve begins at the spine and wraps around the body, lying in a groove underneath each rib.  Each intercostal nerve innervates a specific strip of skin and body walk of the abdomen and chest.  Therefore, injuries of the chest wall or abdominal wall  result in pain that is transmitted back to the brian via the intercostal nerves.  Examples of such injuries include rib fractures and incisions for lung and gall bladder surgery.  Occasionally, pain may persist long after an injury or surgical incision secondary to inflammation and irritation of the intercostal nerve.  The longstanding pain is known as intercostal neuralgia.  An intercostal nerve block is preformed to eliminate pain either temporarily or permanently.  A small needle is placed below the rib and local anesthetic (like Novocaine) and possibly steroid is injected.  Usually 2-4 intercostal nerves are blocked at a time depending on the problem.  The patient will experience a slight "pin-prick" sensation for each injection.  Shortly thereafter, the strip of skin that is innervated by the blocked intercostal nerve will feel numb.  Persistent pain that is only temporarily relieved with local anesthetic may require a more permanent block. This procedure is called Cryoneurolysis and entails placing a small probe beneath the rib to freeze the nerve.  Conditions that may be treated by intercostal nerve blocks:   Rib fractures  Longstanding pain from surgery of the chest or abdomen (intercostal neuralgia)  Pain from chest tubes  Pain from trauma to the chest  Preparation for the injections:  1. Do not eat any solid food or dairy products within 8 hours of your appointment. 2. You may drink clear liquids up to 3 hours before appointment.  Clear liquids include water, black coffee, juice or soda.  No milk or cream please. 3. You may take your regular medication, including pain medications, with a sip of water before your appointment.  Diabetics should hold regular insulin (if take separately) and take 1/2 normal NPH dose the morning of the procedure.   Carry some sugar containing items with you to your appointment. 4. A driver must accompany you and be prepared to drive you home after your  procedure. 5. Bring all your current medications with you. 6. An IV may be inserted and sedation may be given at the discretion of the physician. 7. A blood pressure cuff, EKG and other monitors will often be applied during the procedure.  Some patients may need to have extra oxygen administered for a short period. 8. You will be asked to provide medical information, including your allergies, prior to the procedure.  We must know immediately if you are taking blood thinners (like Coumadin/Warfarin) or if you are allergic to IV iodine contrast (dye). We must know if you could possible be pregnant.  Possible side-effects:   Bleeding from needle site  Infection (rare)  Nerve injury (rare)  Numbness & tingling of skin  Collapsed lung requiring chest tube (rare)  Local anesthetic toxicity (rare)  Light-headedness (temporary)  Pain at injection site (several days)  Decreased blood pressure (temporary)  Shortness of breath  Jittery/shaking sensation (temporary)  Call if you experience:   Difficulty breathing or hives (go directly to the emergency room)  Redness, inflammation or drainage at the injection site  Severe pain at the site of the injection  Any new symptoms which are concerning   Please note:  Your pain may subside immediately but may return several hours after the injection.  Often, more than one injection is required to reduce the pain. Also, if several temporary blocks with local anesthetic are ineffective, a more permanent block with cryolysis may be necessary.  This will be discussed with you should this be the case.  If you have any questions, please call [phone] [place]  What are the risk, side effects and possible complications? Generally speaking, most procedures are safe.  However, with any procedure there are risks, side effects, and the possibility of complications.  The  risks and complications are dependent upon the sites that are lesioned, or the type of nerve block to be performed.  The closer the procedure is to the spine, the more serious the risks are.  Great care is taken when placing the radio frequency needles, block needles or lesioning probes, but sometimes complications can occur. 1. Infection: Any time there is an injection through the skin, there is a risk of infection.  This is why sterile conditions are used for these blocks.  There are four possible types of infection. 1. Localized skin infection. 2. Central Nervous System Infection-This can be in the form of Meningitis, which can be deadly. 3. Epidural Infections-This can be in the form of an epidural abscess, which can cause pressure inside of the spine, causing compression of the spinal cord with subsequent paralysis. This would require an emergency surgery to decompress, and there are no guarantees that the patient would recover from the paralysis. 4. Discitis-This is an infection of the intervertebral discs.  It occurs in about 1% of discography procedures.  It is difficult to treat and it may lead to surgery.        2. Pain: the needles have to go through skin  and soft tissues, will cause soreness.       3. Damage to internal structures:  The nerves to be lesioned may be near blood vessels or    other nerves which can be potentially damaged.       4. Bleeding: Bleeding is more common if the patient is taking blood thinners such as  aspirin, Coumadin, Ticiid, Plavix, etc., or if he/she have some genetic predisposition  such as hemophilia. Bleeding into the spinal canal can cause compression of the spinal  cord with subsequent paralysis.  This would require an emergency surgery to  decompress and there are no guarantees that the patient would recover from the  paralysis.       5. Pneumothorax:  Puncturing of a lung is a possibility, every time a needle is introduced in  the area of the chest or upper  back.  Pneumothorax refers to free air around the  collapsed lung(s), inside of the thoracic cavity (chest cavity).  Another two possible  complications related to a similar event would include: Hemothorax and Chylothorax.   These are variations of the Pneumothorax, where instead of air around the collapsed  lung(s), you may have blood or chyle, respectively.       6. Spinal headaches: They may occur with any procedures in the area of the spine.       7. Persistent CSF (Cerebro-Spinal Fluid) leakage: This is a rare problem, but may occur  with prolonged intrathecal or epidural catheters either due to the formation of a fistulous  track or a dural tear.       8. Nerve damage: By working so close to the spinal cord, there is always a possibility of  nerve damage, which could be as serious as a permanent spinal cord injury with  paralysis.       9. Death:  Although rare, severe deadly allergic reactions known as "Anaphylactic  reaction" can occur to any of the medications used.      10. Worsening of the symptoms:  We can always make thing worse.  What are the chances of something like this happening? Chances of any of this occuring are extremely low.  By statistics, you have more of a chance of getting killed in a motor vehicle accident: while driving to the hospital than any of the above occurring .  Nevertheless, you should be aware that they are possibilities.  In general, it is similar to taking a shower.  Everybody knows that you can slip, hit your head and get killed.  Does that mean that you should not shower again?  Nevertheless always keep in mind that statistics do not mean anything if you happen to be on the wrong side of them.  Even if a procedure has a 1 (one) in a 1,000,000 (million) chance of going wrong, it you happen to be that one..Also, keep in mind that by statistics, you have more of a chance of having something go wrong when taking medications.  Who should not have this procedure? If  you are on a blood thinning medication (e.g. Coumadin, Plavix, see list of "Blood Thinners"), or if you have an active infection going on, you should not have the procedure.  If you are taking any blood thinners, please inform your physician.  How should I prepare for this procedure?  Do not eat or drink anything at least six hours prior to the procedure.  Bring a driver with you .  It cannot be a taxi.  Come accompanied by an adult that can drive you back, and that is strong enough to help you if your legs get weak or numb from the local anesthetic.  Take all of your medicines the morning of the procedure with just enough water to swallow them.  If you have diabetes, make sure that you are scheduled to have your procedure done first thing in the morning, whenever possible.  If you have diabetes, take only half of your insulin dose and notify our nurse that you have done so as soon as you arrive at the clinic.  If you are diabetic, but only take blood sugar pills (oral hypoglycemic), then do not take them on the morning of your procedure.  You may take them after you have had the procedure.  Do not take aspirin or any aspirin-containing medications, at least eleven (11) days prior to the procedure.  They may prolong bleeding.  Wear loose fitting clothing that may be easy to take off and that you would not mind if it got stained with Betadine or blood.  Do not wear any jewelry or perfume  Remove any nail coloring.  It will interfere with some of our monitoring equipment.  NOTE: Remember that this is not meant to be interpreted as a complete list of all possible complications.  Unforeseen problems may occur.  BLOOD THINNERS The following drugs contain aspirin or other products, which can cause increased bleeding during surgery and should not be taken for 2 weeks prior to and 1 week after surgery.  If you should need take something for relief of minor pain, you may take acetaminophen which  is found in Tylenol,m Datril, Anacin-3 and Panadol. It is not blood thinner. The products listed below are.  Do not take any of the products listed below in addition to any listed on your instruction sheet.  A.P.C or A.P.C with Codeine Codeine Phosphate Capsules #3 Ibuprofen Ridaura  ABC compound Congesprin Imuran rimadil  Advil Cope Indocin Robaxisal  Alka-Seltzer Effervescent Pain Reliever and Antacid Coricidin or Coricidin-D  Indomethacin Rufen  Alka-Seltzer plus Cold Medicine Cosprin Ketoprofen S-A-C Tablets  Anacin Analgesic Tablets or Capsules Coumadin Korlgesic Salflex  Anacin Extra Strength Analgesic tablets or capsules CP-2 Tablets Lanoril Salicylate  Anaprox Cuprimine Capsules Levenox Salocol  Anexsia-D Dalteparin Magan Salsalate  Anodynos Darvon compound Magnesium Salicylate Sine-off  Ansaid Dasin Capsules Magsal Sodium Salicylate  Anturane Depen Capsules Marnal Soma  APF Arthritis pain formula Dewitt's Pills Measurin Stanback  Argesic Dia-Gesic Meclofenamic Sulfinpyrazone  Arthritis Bayer Timed Release Aspirin Diclofenac Meclomen Sulindac  Arthritis pain formula Anacin Dicumarol Medipren Supac  Analgesic (Safety coated) Arthralgen Diffunasal Mefanamic Suprofen  Arthritis Strength Bufferin Dihydrocodeine Mepro Compound Suprol  Arthropan liquid Dopirydamole Methcarbomol with Aspirin Synalgos  ASA tablets/Enseals Disalcid Micrainin Tagament  Ascriptin Doan's Midol Talwin  Ascriptin A/D Dolene Mobidin Tanderil  Ascriptin Extra Strength Dolobid Moblgesic Ticlid  Ascriptin with Codeine Doloprin or Doloprin with Codeine Momentum Tolectin  Asperbuf Duoprin Mono-gesic Trendar  Aspergum Duradyne Motrin or Motrin IB Triminicin  Aspirin plain, buffered or enteric coated Durasal Myochrisine Trigesic  Aspirin Suppositories Easprin Nalfon Trillsate  Aspirin with Codeine Ecotrin Regular or Extra Strength Naprosyn Uracel  Atromid-S Efficin Naproxen Ursinus  Auranofin Capsules Elmiron  Neocylate Vanquish  Axotal Emagrin Norgesic Verin  Azathioprine Empirin or Empirin with Codeine Normiflo Vitamin E  Azolid Emprazil Nuprin Voltaren  Bayer Aspirin plain, buffered or children's or timed BC Tablets or powders Encaprin Orgaran Warfarin Sodium  Buff-a-Comp Enoxaparin Orudis Zorpin  Buff-a-Comp with Codeine Equegesic Os-Cal-Gesic   Buffaprin Excedrin plain, buffered or Extra Strength Oxalid   Bufferin Arthritis Strength Feldene Oxphenbutazone   Bufferin plain or Extra Strength Feldene Capsules Oxycodone with Aspirin   Bufferin with Codeine Fenoprofen Fenoprofen Pabalate or Pabalate-SF   Buffets II Flogesic Panagesic   Buffinol plain or Extra Strength Florinal or Florinal with Codeine Panwarfarin   Buf-Tabs Flurbiprofen Penicillamine   Butalbital Compound Four-way cold tablets Penicillin   Butazolidin Fragmin Pepto-Bismol   Carbenicillin Geminisyn Percodan   Carna Arthritis Reliever Geopen Persantine   Carprofen Gold's salt Persistin   Chloramphenicol Goody's Phenylbutazone   Chloromycetin Haltrain Piroxlcam   Clmetidine heparin Plaquenil   Cllnoril Hyco-pap Ponstel   Clofibrate Hydroxy chloroquine Propoxyphen         Before stopping any of these medications, be sure to consult the physician who ordered them.  Some, such as Coumadin (Warfarin) are ordered to prevent or treat serious conditions such as "deep thrombosis", "pumonary embolisms", and other heart problems.  The amount of time that you may need off of the medication may also vary with the medication and the reason for which you were taking it.  If you are taking any of these medications, please make sure you notify your pain physician before you undergo any procedures.         Intercostal Nerve Block Patient Information  Description: The twelve intercostal nerves arise from the first thru twelfth thoracic nerve roots.  The nerve begins at the spine and wraps around the body, lying in a groove underneath each  rib.  Each intercostal nerve innervates a specific strip of skin and body walk of the abdomen and chest.  Therefore, injuries of the chest wall or abdominal wall result in pain that is transmitted back to the brian via the intercostal nerves.  Examples of such injuries include rib fractures and incisions for lung and gall bladder surgery.  Occasionally, pain may persist long after an injury or surgical incision secondary to inflammation and irritation of the intercostal nerve.  The longstanding pain is known as intercostal neuralgia.  An intercostal nerve block is preformed to eliminate pain either temporarily or permanently.  A small needle is placed below the rib and local anesthetic (like Novocaine) and possibly steroid is injected.  Usually 2-4 intercostal nerves are blocked at a time depending on the problem.  The patient will experience a slight "pin-prick" sensation for each injection.  Shortly thereafter, the strip of skin that is innervated by the blocked intercostal nerve will feel numb.  Persistent pain that is only temporarily relieved with local anesthetic may require a more permanent block. This procedure is called Cryoneurolysis and entails placing a small probe beneath the rib to freeze the nerve.  Conditions that may be treated by intercostal nerve blocks:   Rib fractures  Longstanding pain from surgery of the chest or abdomen (intercostal neuralgia)  Pain from chest tubes  Pain from trauma to the chest  Preparation for the injections:  9. Do not eat any solid food or dairy products within 8 hours of your appointment. 10. You may drink clear liquids up to 3 hours before appointment.  Clear liquids include water, black coffee, juice or soda.  No milk or cream please. 11. You may take your regular medication, including pain medications, with a sip of water before your appointment.  Diabetics should hold regular insulin (if take separately) and take 1/2 normal NPH dose the morning of  the procedure.   Carry  some sugar containing items with you to your appointment. 12. A driver must accompany you and be prepared to drive you home after your procedure. 24. Bring all your current medications with you. 14. An IV may be inserted and sedation may be given at the discretion of the physician. 15. A blood pressure cuff, EKG and other monitors will often be applied during the procedure.  Some patients may need to have extra oxygen administered for a short period. 83. You will be asked to provide medical information, including your allergies, prior to the procedure.  We must know immediately if you are taking blood thinners (like Coumadin/Warfarin) or if you are allergic to IV iodine contrast (dye). We must know if you could possible be pregnant.  Possible side-effects:   Bleeding from needle site  Infection (rare)  Nerve injury (rare)  Numbness & tingling of skin  Collapsed lung requiring chest tube (rare)  Local anesthetic toxicity (rare)  Light-headedness (temporary)  Pain at injection site (several days)  Decreased blood pressure (temporary)  Shortness of breath  Jittery/shaking sensation (temporary)  Call if you experience:   Difficulty breathing or hives (go directly to the emergency room)  Redness, inflammation or drainage at the injection site  Severe pain at the site of the injection  Any new symptoms which are concerning   Please note:  Your pain may subside immediately but may return several hours after the injection.  Often, more than one injection is required to reduce the pain. Also, if several temporary blocks with local anesthetic are ineffective, a more permanent block with cryolysis may be necessary.  This will be discussed with you should this be the case.  If you have any questions, please call [phone] [place]  What are the risk, side effects and possible  complications? Generally speaking, most procedures are safe.  However, with any procedure there are risks, side effects, and the possibility of complications.  The risks and complications are dependent upon the sites that are lesioned, or the type of nerve block to be performed.  The closer the procedure is to the spine, the more serious the risks are.  Great care is taken when placing the radio frequency needles, block needles or lesioning probes, but sometimes complications can occur. 1. Infection: Any time there is an injection through the skin, there is a risk of infection.  This is why sterile conditions are used for these blocks.  There are four possible types of infection. 1. Localized skin infection. 2. Central Nervous System Infection-This can be in the form of Meningitis, which can be deadly. 3. Epidural Infections-This can be in the form of an epidural abscess, which can cause pressure inside of the spine, causing compression of the spinal cord with subsequent paralysis. This would require an emergency surgery to decompress, and there are no guarantees that the patient would recover from the paralysis. 4. Discitis-This is an infection of the intervertebral discs.  It occurs in about 1% of discography procedures.  It is difficult to treat and it may lead to surgery.        2. Pain: the needles have to go through skin and soft tissues, will cause soreness.       3. Damage to internal structures:  The nerves to be lesioned may be near blood vessels or    other nerves which can be potentially damaged.       4. Bleeding:  Bleeding is more common if the patient is taking blood thinners such as  aspirin, Coumadin, Ticiid, Plavix, etc., or if he/she have some genetic predisposition  such as hemophilia. Bleeding into the spinal canal can cause compression of the spinal  cord with subsequent paralysis.  This would require an emergency surgery to  decompress and there are no guarantees that the patient  would recover from the  paralysis.       5. Pneumothorax:  Puncturing of a lung is a possibility, every time a needle is introduced in  the area of the chest or upper back.  Pneumothorax refers to free air around the  collapsed lung(s), inside of the thoracic cavity (chest cavity).  Another two possible  complications related to a similar event would include: Hemothorax and Chylothorax.   These are variations of the Pneumothorax, where instead of air around the collapsed  lung(s), you may have blood or chyle, respectively.       6. Spinal headaches: They may occur with any procedures in the area of the spine.       7. Persistent CSF (Cerebro-Spinal Fluid) leakage: This is a rare problem, but may occur  with prolonged intrathecal or epidural catheters either due to the formation of a fistulous  track or a dural tear.       8. Nerve damage: By working so close to the spinal cord, there is always a possibility of  nerve damage, which could be as serious as a permanent spinal cord injury with  paralysis.       9. Death:  Although rare, severe deadly allergic reactions known as "Anaphylactic  reaction" can occur to any of the medications used.      10. Worsening of the symptoms:  We can always make thing worse.  What are the chances of something like this happening? Chances of any of this occuring are extremely low.  By statistics, you have more of a chance of getting killed in a motor vehicle accident: while driving to the hospital than any of the above occurring .  Nevertheless, you should be aware that they are possibilities.  In general, it is similar to taking a shower.  Everybody knows that you can slip, hit your head and get killed.  Does that mean that you should not shower again?  Nevertheless always keep in mind that statistics do not mean anything if you happen to be on the wrong side of them.  Even if a procedure has a 1 (one) in a 1,000,000 (million) chance of going wrong, it you happen to be that  one..Also, keep in mind that by statistics, you have more of a chance of having something go wrong when taking medications.  Who should not have this procedure? If you are on a blood thinning medication (e.g. Coumadin, Plavix, see list of "Blood Thinners"), or if you have an active infection going on, you should not have the procedure.  If you are taking any blood thinners, please inform your physician.  How should I prepare for this procedure?  Do not eat or drink anything at least six hours prior to the procedure.  Bring a driver with you .  It cannot be a taxi.  Come accompanied by an adult that can drive you back, and that is strong enough to help you if your legs get weak or numb from the local anesthetic.  Take all of your medicines the morning of the procedure with just enough water to swallow  them.  If you have diabetes, make sure that you are scheduled to have your procedure done first thing in the morning, whenever possible.  If you have diabetes, take only half of your insulin dose and notify our nurse that you have done so as soon as you arrive at the clinic.  If you are diabetic, but only take blood sugar pills (oral hypoglycemic), then do not take them on the morning of your procedure.  You may take them after you have had the procedure.  Do not take aspirin or any aspirin-containing medications, at least eleven (11) days prior to the procedure.  They may prolong bleeding.  Wear loose fitting clothing that may be easy to take off and that you would not mind if it got stained with Betadine or blood.  Do not wear any jewelry or perfume  Remove any nail coloring.  It will interfere with some of our monitoring equipment.  NOTE: Remember that this is not meant to be interpreted as a complete list of all possible complications.  Unforeseen problems may occur.  BLOOD THINNERS The following drugs contain aspirin or other products, which can cause increased bleeding during  surgery and should not be taken for 2 weeks prior to and 1 week after surgery.  If you should need take something for relief of minor pain, you may take acetaminophen which is found in Tylenol,m Datril, Anacin-3 and Panadol. It is not blood thinner. The products listed below are.  Do not take any of the products listed below in addition to any listed on your instruction sheet.  A.P.C or A.P.C with Codeine Codeine Phosphate Capsules #3 Ibuprofen Ridaura  ABC compound Congesprin Imuran rimadil  Advil Cope Indocin Robaxisal  Alka-Seltzer Effervescent Pain Reliever and Antacid Coricidin or Coricidin-D  Indomethacin Rufen  Alka-Seltzer plus Cold Medicine Cosprin Ketoprofen S-A-C Tablets  Anacin Analgesic Tablets or Capsules Coumadin Korlgesic Salflex  Anacin Extra Strength Analgesic tablets or capsules CP-2 Tablets Lanoril Salicylate  Anaprox Cuprimine Capsules Levenox Salocol  Anexsia-D Dalteparin Magan Salsalate  Anodynos Darvon compound Magnesium Salicylate Sine-off  Ansaid Dasin Capsules Magsal Sodium Salicylate  Anturane Depen Capsules Marnal Soma  APF Arthritis pain formula Dewitt's Pills Measurin Stanback  Argesic Dia-Gesic Meclofenamic Sulfinpyrazone  Arthritis Bayer Timed Release Aspirin Diclofenac Meclomen Sulindac  Arthritis pain formula Anacin Dicumarol Medipren Supac  Analgesic (Safety coated) Arthralgen Diffunasal Mefanamic Suprofen  Arthritis Strength Bufferin Dihydrocodeine Mepro Compound Suprol  Arthropan liquid Dopirydamole Methcarbomol with Aspirin Synalgos  ASA tablets/Enseals Disalcid Micrainin Tagament  Ascriptin Doan's Midol Talwin  Ascriptin A/D Dolene Mobidin Tanderil  Ascriptin Extra Strength Dolobid Moblgesic Ticlid  Ascriptin with Codeine Doloprin or Doloprin with Codeine Momentum Tolectin  Asperbuf Duoprin Mono-gesic Trendar  Aspergum Duradyne Motrin or Motrin IB Triminicin  Aspirin plain, buffered or enteric coated Durasal Myochrisine Trigesic  Aspirin  Suppositories Easprin Nalfon Trillsate  Aspirin with Codeine Ecotrin Regular or Extra Strength Naprosyn Uracel  Atromid-S Efficin Naproxen Ursinus  Auranofin Capsules Elmiron Neocylate Vanquish  Axotal Emagrin Norgesic Verin  Azathioprine Empirin or Empirin with Codeine Normiflo Vitamin E  Azolid Emprazil Nuprin Voltaren  Bayer Aspirin plain, buffered or children's or timed BC Tablets or powders Encaprin Orgaran Warfarin Sodium  Buff-a-Comp Enoxaparin Orudis Zorpin  Buff-a-Comp with Codeine Equegesic Os-Cal-Gesic   Buffaprin Excedrin plain, buffered or Extra Strength Oxalid   Bufferin Arthritis Strength Feldene Oxphenbutazone   Bufferin plain or Extra Strength Feldene Capsules Oxycodone with Aspirin   Bufferin with Codeine Fenoprofen Fenoprofen Pabalate or Pabalate-SF  Buffets II Flogesic Panagesic   Buffinol plain or Extra Strength Florinal or Florinal with Codeine Panwarfarin   Buf-Tabs Flurbiprofen Penicillamine   Butalbital Compound Four-way cold tablets Penicillin   Butazolidin Fragmin Pepto-Bismol   Carbenicillin Geminisyn Percodan   Carna Arthritis Reliever Geopen Persantine   Carprofen Gold's salt Persistin   Chloramphenicol Goody's Phenylbutazone   Chloromycetin Haltrain Piroxlcam   Clmetidine heparin Plaquenil   Cllnoril Hyco-pap Ponstel   Clofibrate Hydroxy chloroquine Propoxyphen         Before stopping any of these medications, be sure to consult the physician who ordered them.  Some, such as Coumadin (Warfarin) are ordered to prevent or treat serious conditions such as "deep thrombosis", "pumonary embolisms", and other heart problems.  The amount of time that you may need off of the medication may also vary with the medication and the reason for which you were taking it.  If you are taking any of these medications, please make sure you notify your pain physician before you undergo any procedures.

## 2016-04-12 NOTE — Progress Notes (Signed)
1342 Pulse ox 88% on room air. O2 at 2L per [place] applied.

## 2016-04-12 NOTE — Progress Notes (Signed)
Patient's Name: Stephen Leon  MRN: [id number]  Referring Provider: Shawna Orleans, Doe-Hyun R, DO  DOB: 01-31-1946  PCP: Drema Pry, DO  DOS: 04/12/2016  Note by: Kathlen Brunswick. Dossie Arbour, MD  Service setting: Ambulatory outpatient  Location: [facility] (AMB) Pain Management Facility  Visit type: Procedure  Specialty: Interventional Pain Management  Patient type: Established   Primary Reason(s) for Visit: Interventional Pain Management Treatment. 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 Intercostal neuralgia (T10 & T11) (Right)     Pre-procedure Pain Score: 2 Reported level of pain is compatible with clinical observations Post-procedure Pain Score: 0-No pain  Type: Moderate (Conscious) Sedation & Local Anesthesia Local Anesthetic: Lidocaine 1% Route: Intravenous (IV) IV Access: Secured Sedation: Meaningful verbal contact was maintained at all times during the procedure  Indication(s): Analgesia & Anxiolysis   Pre-Procedure Assessment:  Stephen Leon is a 70 y.o. year old, male patient, seen today for interventional treatment. 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 His primarily concern today is the Abdominal Pain (right upper)   Pain Type: Chronic pain Pain Location: Abdomen Pain Orientation: Right, Upper Pain Descriptors  / Indicators: Aching Pain Frequency: Constant  Date of Last Visit: 04/05/16 Service Provided on Last Visit: Evaluation (ate and did not have procedure)  Coagulation Parameters: Lab Results  Component Value Date   PLT 296 07/06/2007   Verification of the correct person, correct site (including marking of site), and correct procedure were performed and confirmed by the patient.  Consent: Secured. Under the influence of no sedatives a written informed consent was obtained, after having provided information on the risks and possible complications. To fulfill our ethical and legal obligations, as recommended by the American Medical Association's Code of Ethics, we have provided information to the patient about our clinical impression; the nature and purpose of the treatment or procedure; the risks, benefits, and possible complications of the intervention; alternatives; the risk(s) and benefit(s) of the alternative treatment(s) or procedure(s); and the risk(s) and benefit(s) of doing nothing. The patient was provided information about the risks and possible complications associated with the procedure. These include, but are not limited to, failure to achieve desired goals, infection, bleeding, organ or nerve damage, allergic reactions, paralysis, and death. In addition, the patient was informed that Medicine is not an exact science; therefore, there is also the possibility of unforeseen risks and possible complications that may result in a catastrophic outcome. The patient indicated having understood very clearly. We have given the patient no guarantees and we have made no promises. Enough time was given to the patient to ask questions, all of which were answered to the patient's satisfaction.  Consent Attestation: I, the ordering provider, attest that I have discussed with the patient the benefits, risks, side-effects, alternatives, likelihood of achieving goals, and potential problems during recovery  for the procedure that I have provided informed consent.  Pre-Procedure Preparation: Safety Precautions: Allergies reviewed. Appropriate site, procedure, and patient were confirmed by following the Joint Commission's Universal Protocol (  [id number].01.01.01), in the form of a "Time Out". The patient was asked to confirm marked site and procedure, before commencing. The patient was asked about blood thinners, or active infections, both of which were denied. Patient was assessed for positional comfort and all pressure points were checked before starting procedure. Infection Control Precautions: Sterile technique used. Standard Universal Precautions were taken as recommended by the Department of [facility] for Disease Control and Prevention (CDC). Standard pre-surgical skin prep was conducted. Respiratory hygiene and cough etiquette was practiced. Hand hygiene observed. Safe injection practices and needle disposal techniques followed. SDV (single dose vial) medications used. Medications properly checked for expiration dates and contaminants. Personal protective equipment (PPE) used: Sterile Radiation-resistant gloves. Monitoring:  As per clinic protocol. Vitals:   04/12/16 1346 04/12/16 1351 04/12/16 1354 04/12/16 1404  BP: 125/78 135/88 138/88 132/88  Pulse: (!) 59 (!) 59 65 62  Resp: _0 Temp:      SpO2: 93% 94% 96% 91%  Weight:      Height:      Calculated BMI: Body mass index is 25.09 kg/m. Allergies: He is allergic to codeine.. Allergy Precautions: None required  Description of Procedure Process:   Time-out: "Time-out" completed before starting procedure, as per protocol. Position: Left Lateral Decubitus Target Area: The sub-costal neurovascular bundle area Approach: Sub-costal approach Area Prepped: Entire Mid-Axillary Thoracic Region Prepping solution: ChloraPrep (2% chlorhexidine gluconate and 70% isopropyl alcohol) Safety Precautions: Aspiration looking for blood return  was conducted prior to all injections. At no point did we inject any substances, as a needle was being advanced. No attempts were made at seeking any paresthesias. Safe injection practices and needle disposal techniques used. Medications properly checked for expiration dates. SDV (single dose vial) medications used. Description of the Procedure: Protocol guidelines were followed. The patient was placed in position over the procedure table. The target area was identified and the area prepped in the usual manner. Skin & deeper tissues infiltrated with local anesthetic. After cleaning the skin with an antiseptic solution, 1-2 mL of dilute local anesthetic was infiltrated subcutaneously at the planned injection site. The fingers of the palpating hand were used to straddle the insertion site at the inferior border of the rib and fix the skin to avoid unwanted skin movement. Appropriate amount of time allowed to pass for local anesthetics to take effect. The procedure needles were then advanced to the target area at an angle of approximately 20 cephalad to the skin. Contact with the rib was made. While maintaining the same angle of insertion, the needle was walked off the inferior border of the rib as the skin was allowed to return to its initial position. Then the needle was advanced no more than 3 mm below the inferior margin of the rib. Proper needle placement was secured. Negative aspiration confirmed. Following negative aspiration for blood or air, 3-5 mL of local anesthetic was injected. The solution injected in intermittent fashion, asking for systemic symptoms every 0.5cc of injectate. The needle was then removed and the area cleansed, making sure to leave some of the prepping solution back to take advantage of its long term bactericidal properties. EBL: Minimal Materials & Medications Used:  Needle(s) Used: 25g - 1.5" Needle(s) Solution Injected: 0.2% PF-Ropivacaine (45m) + SDV-Decadron 10 mg/ml  (142m  Imaging Guidance:  Type of Imaging Technique: Fluoroscopy Guidance (Non-spinal) Indication(s): Assistance in needle guidance and placement for procedures requiring needle placement in or near specific anatomical locations not easily accessible without  such assistance. Exposure Time: Please see nurses notes. Contrast: None used. Fluoroscopic Guidance: I was personally present in the fluoroscopy suite, where the patient was placed in position for the procedure, over the fluoroscopy-compatible table. Parallax error was corrected before commencing the procedure. A "direction-depth-direction" technique was used to introduce the needle under continuous pulsed fluoroscopic guidance. Permanently recorded images stored by scanning into EMR. Interpretation: Intraoperative imaging interpretation by performing Physician. Adequate needle placement confirmed. Permanent hardcopy images in multiple planes scanned into the patient's record.  Antibiotic Prophylaxis:  Indication(s): No indications identified. Type:  Antibiotics Given (last 72 hours)    None       Post-operative Assessment:  Complications: No immediate post-treatment complications were observed. Disposition: The patient was discharged home, once institutional criteria were met. Return to clinic in 2 weeks for follow-up evaluation and interpretation of results. The patient tolerated the entire procedure well. A repeat set of vitals were taken after the procedure and the patient was kept under observation following institutional policy, for this type of procedure. Post-procedural neurological assessment was performed, showing return to baseline, prior to discharge. The patient was provided with post-procedure discharge instructions, including a section on how to identify potential problems. Should any problems arise concerning this procedure, the patient was given instructions to immediately contact us, at any time, without hesitation. In any  case, we plan to contact the patient by telephone for a follow-up status report regarding this interventional procedure. Comments:  No additional relevant information.  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     Requested PM Follow-up: No Follow-up on file.  No future appointments.  Primary Care Physician: Drema Pry, DO Location: [facility] Note by: Kathlen Brunswick. Dossie Arbour, M.D, DABA, DABAPM, DABPM, DABIPP, FIPP   Disclaimer:  Medicine is not an exact science. The only guarantee in medicine is that nothing is guaranteed. It is important to note that the decision to proceed with this intervention was based on the information collected from the patient. The Data and conclusions were drawn from the patient's questionnaire, the interview, and the physical examination. Because the information was provided in large part by the patient, it cannot be guaranteed that it has not been purposely or unconsciously manipulated. Every effort has been made to obtain as much relevant data as possible for this evaluation. It is important to note that the conclusions that lead to this procedure are derived in large part from the available data. Always take into account that the treatment will also be dependent on availability of resources and existing treatment guidelines, considered by other Pain Management Practitioners as being common knowledge and practice, at the time  of the intervention. For Medico-Legal purposes, it is also important to point out that variation in procedural techniques and pharmacological choices are the acceptable norm. The indications, contraindications, technique, and results of the above procedure should only be interpreted and judged by a Board-Certified Interventional  Pain Specialist with extensive familiarity and expertise in the same exact procedure and technique. Attempts at providing opinions without similar or greater experience and expertise than that of the treating physician will be considered as inappropriate and unethical, and shall result in a formal complaint to the state medical board and applicable specialty societies. Pain Score: We use the NRS-11 scale. This is a self-reported, subjective measurement of pain severity with only modest accuracy. It is used primarily to identify changes within a particular patient. It must be understood that outpatient pain scales are significantly less accurate that those used for research, where they can be applied under ideal controlled circumstances with minimal exposure to variables. In reality, the score is likely to be a combination of pain intensity and pain affect, where pain affect describes the degree of emotional arousal or changes in action readiness caused by the sensory experience of pain. Factors such as social and work situation, setting, emotional state, anxiety levels, expectation, and prior pain experience may influence pain perception and show large inter-individual differences that may also be affected by time variables.

## 2016-04-13 ENCOUNTER — Telehealth: Payer: Self-pay | Admitting: *Deleted

## 2016-04-13 NOTE — Telephone Encounter (Signed)
Message left

## 2016-04-19 DIAGNOSIS — J189 Pneumonia, unspecified organism: Secondary | ICD-10-CM | POA: Diagnosis not present

## 2016-05-12 ENCOUNTER — Ambulatory Visit: Payer: Commercial Managed Care - HMO | Attending: Pain Medicine | Admitting: Pain Medicine

## 2016-05-12 ENCOUNTER — Encounter: Payer: Self-pay | Admitting: Pain Medicine

## 2016-05-12 VITALS — BP 162/94 | HR 66 | Temp 98.3°F | Resp 16 | Ht 68.0 in | Wt 165.0 lb

## 2016-05-12 DIAGNOSIS — I10 Essential (primary) hypertension: Secondary | ICD-10-CM | POA: Diagnosis not present

## 2016-05-12 DIAGNOSIS — M792 Neuralgia and neuritis, unspecified: Secondary | ICD-10-CM | POA: Insufficient documentation

## 2016-05-12 DIAGNOSIS — F17210 Nicotine dependence, cigarettes, uncomplicated: Secondary | ICD-10-CM | POA: Insufficient documentation

## 2016-05-12 DIAGNOSIS — G588 Other specified mononeuropathies: Secondary | ICD-10-CM | POA: Diagnosis not present

## 2016-05-12 DIAGNOSIS — E785 Hyperlipidemia, unspecified: Secondary | ICD-10-CM | POA: Diagnosis not present

## 2016-05-12 DIAGNOSIS — F411 Generalized anxiety disorder: Secondary | ICD-10-CM | POA: Insufficient documentation

## 2016-05-12 DIAGNOSIS — J449 Chronic obstructive pulmonary disease, unspecified: Secondary | ICD-10-CM | POA: Diagnosis not present

## 2016-05-12 DIAGNOSIS — Z85118 Personal history of other malignant neoplasm of bronchus and lung: Secondary | ICD-10-CM | POA: Insufficient documentation

## 2016-05-12 NOTE — Progress Notes (Signed)
Patient's Name: Stephen Leon  MRN: [id number]  Referring Provider: Shawna Orleans, Doe-Hyun R, DO  DOB: February 23, 1946  PCP: Drema Pry, DO  DOS: 05/12/2016  Note by: Kathlen Brunswick. Dossie Arbour, MD  Service setting: Ambulatory outpatient  Specialty: Interventional Pain Management  Location: [facility] (AMB) Pain Management Facility    Patient type: Established   Primary Reason(s) for Visit: Encounter for post-procedure evaluation of chronic illness with mild to moderate exacerbation CC: Chest Pain (right)  HPI  [name] is a 70 y.o. year old, male patient, who comes today for an initial evaluation. 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 His primarily concern today is the Chest Pain (right)  Pain Assessment: Self-Reported Pain Score: 2 /10             Reported level is compatible with observation.       Pain Type: Chronic pain Pain Location: Rib cage Pain Orientation: Right Pain Descriptors / Indicators: Aching Pain Frequency: Constant  The patient comes into the clinics today for post-procedure evaluation on the interventional treatment done on 04/12/2016.  Date of Last Visit: 04/12/16 Service Provided on Last Visit: Procedure (right ICNB)  Post-Procedure Assessment  Procedure done on 04/12/2016: Right-sided T10 and T11 intercostal nerve block under fluoroscopic guidance and IV sedation. Complications experienced at the time of the procedure: None Side-effects or Adverse reactions: None reported Sedation: Sedation provided. When no sedatives are used, the analgesic  levels obtained are directly associated with the effectiveness of the local anesthetics. On the other hand, when sedation is provided, the level of analgesia obtained during the initial 1 hour, immediately following the intervention, is believed to be the result of a combination of factors. These factors may include, but are not limited to: 1. The effectiveness of the local anesthetics used. 2. The effects of the analgesic(s) and/or anxiolytic(s) used. 3. The degree of discomfort experienced by the patient at the time of the procedure. 4. The patients ability and reliability in recalling and recording the events. 5. The presence and influence of possible secondary gains. Results: Relief during the 1st hour after the procedure: 100 % (Ultra-Short Term Relief) Interpretative note: Analgesia during this period is likely to be Local Anesthetic and/or IV Sedative (Analgesic/Anxiolitic) related Local Anesthesia: Long-acting (4-6 hours) anesthetics used. The analgesic levels attained during this period are directly associated to the localized infiltration of local anesthetics and therefore cary significant diagnostic value as to the etiological location or origin of the pain. Results: Relief during the next 4 to 6 hour after the procedure: 100 % (Short Term Relief) Interpretative note: Complete relief confirms area to be the source of pain Long-Term Therapy: Steroids used. Results: Extended relief following procedure: 0 % Interpretative note: No benefit could suggest etiology to be non-inflammatory, possibly mechanical compression or irritation         Long-Term Benefits:  Current Relief (Now): 0%  Interpretative note: No benefit would suggest neuropathy to be associated with permanent nerve damage, persistent neural entrapment, or mechanical compression/impingement, as opposed to an inflammatory-mediated neuropraxia Interpretation of Results: These results would suggest the patient to be a good candidate  for Radiofrequency Ablation. The patient has failed to respond to  conservative therapies including over-the-counter medications, anti-inflammatories, muscle relaxants, membrane stabilizers, opioids, physical therapy, modalities such as heat and ice, as well as more invasive techniques such as nerve blocks. Because [name] did attain more than 50% relief of the pain during a series of diagnostic blocks conducted in separate occasions, I believe it is medically necessary to proceed with Radiofrequency Ablation, in order to attempt gaining longer relief.   Laboratory Chemistry  Inflammation Markers No results found for: ESRSEDRATE, CRP Renal Function Lab Results  Component Value Date   BUN 11 07/06/2007   CREATININE 1.10 08/24/2015   GFRAA 127 07/06/2007   GFRNONAA 105 07/06/2007   Hepatic Function Lab Results  Component Value Date   AST 20 07/06/2007   ALT 17 07/06/2007   ALBUMIN 3.9 07/06/2007   Electrolytes Lab Results  Component Value Date   NA 141 07/06/2007   K 4.5 07/06/2007   CL 104 07/06/2007   CALCIUM 9.2 07/06/2007   Pain Modulating Vitamins No results found for: Maralyn Sago [id number], [id number], [id number], 25OHVITD1, 25OHVITD2, 25OHVITD3, VITAMINB12 Coagulation Parameters Lab Results  Component Value Date   PLT 296 07/06/2007   Cardiovascular Lab Results  Component Value Date   HGB 14.3 07/06/2007   HCT 41.0 07/06/2007   Note: Lab results reviewed.   Recent Diagnostic Imaging  Ct Chest W Contrast  Result Date: 08/24/2015 CLINICAL DATA:  Right upper quadrant abdominal pain. Prior hernia repair. A right upper lobectomy for lung cancer 3 years ago. Right rib and right upper quadrant pain and swelling. EXAM: CT CHEST, ABDOMEN, AND PELVIS WITH CONTRAST TECHNIQUE: Multidetector CT imaging of the chest, abdomen and pelvis was performed following the standard protocol during bolus administration of intravenous contrast. CONTRAST:  146m OMNIPAQUE IOHEXOL  300 MG/ML  SOLN COMPARISON:  PET-CT dated 05/02/2012 FINDINGS: CT CHEST FINDINGS Mediastinum/Nodes: Right lower paratracheal node 1.0 cm in short axis, image 20 series 2, formerly the same in formerly not hypermetabolic. Coronary, aortic arch, and branch vessel atherosclerotic vascular disease. Lungs/Pleura: Right upper lobectomy. 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 These were present and not hypermetabolic previously. No evidence of peripheral fibrosis in the lungs. Centrilobular emphysema. Musculoskeletal: No discrete rib lesion identified. CT ABDOMEN PELVIS FINDINGS Hepatobiliary: 2.5 by 2.2 cm enhancing mass in segment 2 of the liver, previously 3.1 by 2.1 cm when assessed on prior MRI of 03/23/2012, most conspicuous on the arterial phase images of that exam, and not hypermetabolic on the prior PET-CT dated 05/02/2012, with mildly increased T2 signal, potentially an atypical limb angioma or focal nodular hyperplasia. Gallbladder unremarkable. Pancreas: Unremarkable Spleen: Unremarkable Adrenals/Urinary Tract: Unremarkable Stomach/Bowel: Scattered sigmoid colon diverticula. Vascular/Lymphatic: Aortoiliac atherosclerotic vascular disease. Reproductive: Unremarkable Other: No supplemental non-categorized findings. Musculoskeletal: Age-indeterminate superior endplate compression fracture at L2. Not present in 2013. Left-sided iliopsoas bursitis. IMPRESSION: 1. No findings of recurrent malignancy. 2. There is a borderline sized right lower paratracheal lymph node that this was also previously of similar size and not hypermetabolic on the prior PET-CT of 2013. 3. Right upper lobectomy. 4. Centrilobular emphysema. 5. Left-sided iliopsoas bursitis. 6. Age-indeterminate superior endplate compression fracture at L2, likely benign. 7. Coronary, aortic arch, and branch vessel atherosclerotic vascular disease. Aortoiliac atherosclerotic vascular disease. 8. Slightly reduced size of the  enhancing mass in segment 2 of the liver, although the difference in size may simply be due to differences in conspicuity between CT and MRI. This was not hypermetabolic on prior PET-CT and is most likely an atypical hemangioma or focal nodular  hyperplasia. 9. Scattered sigmoid colon diverticula. 10. 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 These results will be called to the ordering clinician or representative by the Radiologist Assistant, and communication documented in the PACS or zVision Dashboard. Electronically Signed   By: Van Clines M.D.   On: 08/24/2015 12:43   Ct Abdomen Pelvis W Contrast  Result Date: 08/24/2015 CLINICAL DATA:  Right upper quadrant abdominal pain. Prior hernia repair. A right upper lobectomy for lung cancer 3 years ago. Right rib and right upper quadrant pain and swelling. EXAM: CT CHEST, ABDOMEN, AND PELVIS WITH CONTRAST TECHNIQUE: Multidetector CT imaging of the chest, abdomen and pelvis was performed following the standard protocol during bolus administration of intravenous contrast. CONTRAST:  141m OMNIPAQUE IOHEXOL 300 MG/ML  SOLN COMPARISON:  PET-CT dated 05/02/2012 FINDINGS: CT CHEST FINDINGS Mediastinum/Nodes: Right lower paratracheal node 1.0 cm in short axis, image 20 series 2, formerly the same in formerly not hypermetabolic. Coronary, aortic arch, and branch vessel atherosclerotic vascular disease. Lungs/Pleura: Right upper lobectomy. 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 These were present and not hypermetabolic previously. No evidence of peripheral fibrosis in the lungs. Centrilobular emphysema. Musculoskeletal: No discrete rib lesion identified. CT ABDOMEN PELVIS FINDINGS Hepatobiliary: 2.5 by 2.2 cm enhancing mass in segment 2 of the liver, previously 3.1 by 2.1 cm when assessed on prior MRI of 03/23/2012, most conspicuous on the arterial phase images of that exam, and not hypermetabolic on the  prior PET-CT dated 05/02/2012, with mildly increased T2 signal, potentially an atypical limb angioma or focal nodular hyperplasia. Gallbladder unremarkable. Pancreas: Unremarkable Spleen: Unremarkable Adrenals/Urinary Tract: Unremarkable Stomach/Bowel: Scattered sigmoid colon diverticula. Vascular/Lymphatic: Aortoiliac atherosclerotic vascular disease. Reproductive: Unremarkable Other: No supplemental non-categorized findings. Musculoskeletal: Age-indeterminate superior endplate compression fracture at L2. Not present in 2013. Left-sided iliopsoas bursitis. IMPRESSION: 1. No findings of recurrent malignancy. 2. There is a borderline sized right lower paratracheal lymph node that this was also previously of similar size and not hypermetabolic on the prior PET-CT of 2013. 3. Right upper lobectomy. 4. Centrilobular emphysema. 5. Left-sided iliopsoas bursitis. 6. Age-indeterminate superior endplate compression fracture at L2, likely benign. 7. Coronary, aortic arch, and branch vessel atherosclerotic vascular disease. Aortoiliac atherosclerotic vascular disease. 8. Slightly reduced size of the enhancing mass in segment 2 of the liver, although the difference in size may simply be due to differences in conspicuity between CT and MRI. This was not hypermetabolic on prior PET-CT and is most likely an atypical hemangioma or focal nodular hyperplasia. 9. Scattered sigmoid colon diverticula. 10. 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 These results will be called to the ordering clinician or representative by the Radiologist Assistant, and communication documented in the PACS or zVision Dashboard. Electronically Signed   By: WVan ClinesM.D.   On: 08/24/2015 12:43   Meds  The patient has a current medication list which includes the following prescription(s): albuterol, gabapentin, and tramadol.  Current Outpatient Prescriptions on File Prior to Visit  Medication Sig  . albuterol  (PROVENTIL HFA;VENTOLIN HFA) 108 (90 Base) MCG/ACT inhaler Inhale into the lungs.  . gabapentin (NEURONTIN) 400 MG capsule Take 400 mg by mouth 3 (three) times daily. Reported on 09/28/2015  . traMADol (ULTRAM) 50 MG tablet Take by mouth.   No current facility-administered medications on file prior to visit.    ROS  Constitutional: Denies any fever or chills Gastrointestinal: No reported hemesis, hematochezia, vomiting, or acute GI distress Musculoskeletal: Denies any acute onset joint swelling, redness, loss  of ROM, or weakness Neurological: No reported episodes of acute onset apraxia, aphasia, dysarthria, agnosia, amnesia, paralysis, loss of coordination, or loss of consciousness  Allergies  Mr. Jese Comella is allergic to codeine.  [place]  Medical:  Mr. Teran Knittle  has a past medical history of Cancer [facility]) and Hypertension. Family: family history includes COPD in his mother. Surgical:  has a past surgical history that includes Hernia repair. Tobacco:  reports that he has been smoking Cigarettes.  He has been smoking about 0.25 packs per day. He has never used smokeless tobacco. Alcohol:  reports that he drinks about 7.2 oz of alcohol per week . Drug:  reports that he does not use drugs.  Constitutional Exam  General appearance: Well nourished, well developed, and well hydrated. In no acute distress Vitals:   05/12/16 1003  BP: (!) 162/94  Pulse: 66  Resp: 16  Temp: 98.3 F (36.8 C)  SpO2: 98%  Weight: 165 lb (74.8 kg)  Height: '5\' 8"'$  (1.727 m)  BMI Assessment: Estimated body mass index is 25.09 kg/m as calculated from the following:   Height as of this encounter: '5\' 8"'$  (1.727 m).   Weight as of this encounter: 165 lb (74.8 kg).   BMI interpretation: (25-29.9 kg/m2) = Overweight: This range is associated with a 20% higher incidence of chronic pain. BMI Readings from Last 4 Encounters:  05/12/16 25.09 kg/m  04/12/16 25.09 kg/m  04/05/16 25.09 kg/m  10/14/15  25.09 kg/m   Wt Readings from Last 4 Encounters:  05/12/16 165 lb (74.8 kg)  04/12/16 165 lb (74.8 kg)  04/05/16 165 lb (74.8 kg)  10/14/15 165 lb (74.8 kg)  Psych/Mental status: Alert and oriented x 3 (person, place, & time) Eyes: PERLA Respiratory: No evidence of acute respiratory distress  Cervical Spine Exam  Inspection: No masses, redness, or swelling Alignment: Symmetrical Functional ROM: Unrestricted ROM Stability: No instability detected Muscle strength & Tone: Functionally intact Sensory: Unimpaired Palpation: Non-contributory  Upper Extremity (UE) Exam    Side: Right upper extremity  Side: Left upper extremity  Inspection: No masses, redness, swelling, or asymmetry  Inspection: No masses, redness, swelling, or asymmetry  Functional ROM: Unrestricted ROM         Functional ROM: Unrestricted ROM          Muscle strength & Tone: Functionally intact  Muscle strength & Tone: Functionally intact  Sensory: Unimpaired  Sensory: Unimpaired  Palpation: Non-contributory  Palpation: Non-contributory   Thoracic Spine Exam  Inspection: No masses, redness, or swelling Alignment: Symmetrical Functional ROM: Unrestricted ROM Stability: No instability detected Sensory: Unimpaired Muscle strength & Tone: Functionally intact Palpation: Non-contributory  Lumbar Spine Exam  Inspection: No masses, redness, or swelling Alignment: Symmetrical Functional ROM: Unrestricted ROM Stability: No instability detected Muscle strength & Tone: Functionally intact Sensory: Unimpaired Palpation: Non-contributory Provocative Tests: Lumbar Hyperextension and rotation test: evaluation deferred today       Patrick's Maneuver: evaluation deferred today              Gait & Posture Assessment  Ambulation: Unassisted Gait: Relatively normal for age and body habitus Posture: WNL   Lower Extremity Exam    Side: Right lower extremity  Side: Left lower extremity  Inspection: No masses, redness,  swelling, or asymmetry  Inspection: No masses, redness, swelling, or asymmetry  Functional ROM: Unrestricted ROM          Functional ROM: Unrestricted ROM          Muscle strength & Tone:  Functionally intact  Muscle strength & Tone: Functionally intact  Sensory: Unimpaired  Sensory: Unimpaired  Palpation: Non-contributory  Palpation: Non-contributory    Assessment & Plan  Primary Diagnosis & Pertinent Problem List: The encounter diagnosis was Intercostal neuralgia (T10 & T11) (Right).  Visit Diagnosis: 1. Intercostal neuralgia (T10 & T11) (Right)    Plan of Care   Problem List Items Addressed This Visit      High   Intercostal neuralgia (T10 & T11) (Right) - Primary (Chronic)   Relevant Orders   Radiofrequency ablation, other    Other Visit Diagnoses   None.    Pharmacotherapy (Medications Ordered): No orders of the defined types were placed in this encounter.  New Prescriptions   No medications on file   Medications administered during this visit: Mr. Chue Berkovich had no medications administered during this visit. Lab-work, Procedure(s), & Referral(s) Ordered: Orders Placed This Encounter  Procedures  . Radiofrequency ablation, other   Imaging & Referral(s) Ordered: None  Interventional Therapies: Scheduled:  Right sided T10 and T11 intercostal nerve radiofrequency ablation under fluoroscopic guidance and IV sedation.    Considering:  Right sided T10 and T11 intercostal nerve radiofrequency ablation under fluoroscopic guidance and IV sedation.    PRN Procedures:  None at this point.    Requested PM Follow-up: Return for Schedule Procedure, (ASAA).  No future appointments.  Primary Care Physician: Drema Pry, DO Location: [facility] Management Facility Note by: Kathlen Brunswick. Dossie Arbour, M.D, DABA, DABAPM, DABPM, DABIPP, FIPP  Pain Score Disclaimer: We use the NRS-11 scale. This is a self-reported, subjective measurement of pain severity with only  modest accuracy. It is used primarily to identify changes within a particular patient. It must be understood that outpatient pain scales are significantly less accurate that those used for research, where they can be applied under ideal controlled circumstances with minimal exposure to variables. In reality, the score is likely to be a combination of pain intensity and pain affect, where pain affect describes the degree of emotional arousal or changes in action readiness caused by the sensory experience of pain. Factors such as social and work situation, setting, emotional state, anxiety levels, expectation, and prior pain experience may influence pain perception and show large inter-individual differences that may also be affected by time variables.  Patient instructions provided during this appointment: Patient Instructions  Radiofrequency Lesioning Radiofrequency lesioning is a procedure that is performed to relieve pain. The procedure is often used for back, neck, or arm pain. Radiofrequency lesioning involves the use of a machine that creates radio waves to make heat. During the procedure, the heat is applied to the nerve that carries the pain signal. The heat damages the nerve and interferes with the pain signal. Pain relief usually lasts for 6 months to 1 year. LET [facility] CARE PROVIDER KNOW ABOUT:  Any allergies you have.  All medicines you are taking, including vitamins, herbs, eye drops, creams, and over-the-counter medicines.  Previous problems you or members of your family have had with the use of anesthetics.  Any blood disorders you have.  Previous surgeries you have had.  Any medical conditions you have.  Whether you are pregnant or may be pregnant. RISKS AND COMPLICATIONS Generally, this is a safe procedure. However, problems may occur, including:  Pain or soreness at the injection site.  Infection at the injection site.  Damage to nerves or blood vessels. BEFORE THE  PROCEDURE  Ask your health care provider about:  Changing or stopping your  regular medicines. This is especially important if you are taking diabetes medicines or blood thinners.  Taking medicines such as aspirin and ibuprofen. These medicines can thin your blood. Do not take these medicines before your procedure if your health care provider instructs you not to.  Follow instructions from your health care provider about eating or drinking restrictions.  Plan to have someone take you home after the procedure.  If you go home right after the procedure, plan to have someone with you for 24 hours. PROCEDURE  You will be given one or more of the following:  A medicine to help you relax (sedative).  A medicine to numb the area (local anesthetic).  You will be awake during the procedure. You will need to be able to talk with the health care provider during the procedure.  With the help of a type of X-ray (fluoroscopy), the health care provider will insert a radiofrequency needle into the area to be treated.  Next, a wire that carries the radio waves (electrode) will be put through the radiofrequency needle. An electrical pulse will be sent through the electrode to verify the correct nerve. You will feel a tingling sensation, and you may have muscle twitching.  Then, the tissue that is around the needle tip will be heated by an electric current that is passed using the radiofrequency machine. This will numb the nerves.  A bandage (dressing) will be put on the insertion area after the procedure is done. The procedure may vary among health care providers and hospitals. AFTER THE PROCEDURE  Your blood pressure, heart rate, breathing rate, and blood oxygen level will be monitored often until the medicines you were given have worn off.  Return to your normal activities as directed by your health care provider.   This information is not intended to replace advice given to you by your health  care provider. Make sure you discuss any questions you have with your health care provider.   Document Released: 03/23/2011 Document Revised: 04/15/2015 Document Reviewed: 09/01/2014 Elsevier Interactive Patient Education 2016 [place] AFTER MIDNIGHT THE NIGHT BEFORE PROCEDURE. NO FLU SHOT FOR TWO WEEKS PRIOR TO OR 2 WEEKS AFTER PROCEDURE.

## 2016-05-12 NOTE — Patient Instructions (Signed)
Radiofrequency Lesioning Radiofrequency lesioning is a procedure that is performed to relieve pain. The procedure is often used for back, neck, or arm pain. Radiofrequency lesioning involves the use of a machine that creates radio waves to make heat. During the procedure, the heat is applied to the nerve that carries the pain signal. The heat damages the nerve and interferes with the pain signal. Pain relief usually lasts for 6 months to 1 year. LET [facility] CARE PROVIDER KNOW ABOUT:  Any allergies you have.  All medicines you are taking, including vitamins, herbs, eye drops, creams, and over-the-counter medicines.  Previous problems you or members of your family have had with the use of anesthetics.  Any blood disorders you have.  Previous surgeries you have had.  Any medical conditions you have.  Whether you are pregnant or may be pregnant. RISKS AND COMPLICATIONS Generally, this is a safe procedure. However, problems may occur, including:  Pain or soreness at the injection site.  Infection at the injection site.  Damage to nerves or blood vessels. BEFORE THE PROCEDURE  Ask your health care provider about:  Changing or stopping your regular medicines. This is especially important if you are taking diabetes medicines or blood thinners.  Taking medicines such as aspirin and ibuprofen. These medicines can thin your blood. Do not take these medicines before your procedure if your health care provider instructs you not to.  Follow instructions from your health care provider about eating or drinking restrictions.  Plan to have someone take you home after the procedure.  If you go home right after the procedure, plan to have someone with you for 24 hours. PROCEDURE  You will be given one or more of the following:  A medicine to help you relax (sedative).  A medicine to numb the area (local anesthetic).  You will be awake during the procedure. You will need to be able to  talk with the health care provider during the procedure.  With the help of a type of X-ray (fluoroscopy), the health care provider will insert a radiofrequency needle into the area to be treated.  Next, a wire that carries the radio waves (electrode) will be put through the radiofrequency needle. An electrical pulse will be sent through the electrode to verify the correct nerve. You will feel a tingling sensation, and you may have muscle twitching.  Then, the tissue that is around the needle tip will be heated by an electric current that is passed using the radiofrequency machine. This will numb the nerves.  A bandage (dressing) will be put on the insertion area after the procedure is done. The procedure may vary among health care providers and hospitals. AFTER THE PROCEDURE  Your blood pressure, heart rate, breathing rate, and blood oxygen level will be monitored often until the medicines you were given have worn off.  Return to your normal activities as directed by your health care provider.   This information is not intended to replace advice given to you by your health care provider. Make sure you discuss any questions you have with your health care provider.   Document Released: 03/23/2011 Document Revised: 04/15/2015 Document Reviewed: 09/01/2014 Elsevier Interactive Patient Education 2016 [place] AFTER MIDNIGHT THE NIGHT BEFORE PROCEDURE. NO FLU SHOT FOR TWO WEEKS PRIOR TO OR 2 WEEKS AFTER PROCEDURE.

## 2016-05-12 NOTE — Progress Notes (Signed)
Safety precautions to be maintained throughout the outpatient stay will include: orient to surroundings, keep bed in low position, maintain call bell within reach at all times, provide assistance with transfer out of bed and ambulation.  

## 2016-05-30 ENCOUNTER — Telehealth: Payer: Self-pay | Admitting: Pain Medicine

## 2016-05-31 ENCOUNTER — Ambulatory Visit
Admission: RE | Admit: 2016-05-31 | Discharge: 2016-05-31 | Disposition: A | Discharge status: Home / Self Care | Payer: Commercial Managed Care - HMO | Source: Ambulatory Visit | Attending: Pain Medicine | Admitting: Pain Medicine

## 2016-05-31 ENCOUNTER — Ambulatory Visit (HOSPITAL_BASED_OUTPATIENT_CLINIC_OR_DEPARTMENT_OTHER): Payer: Commercial Managed Care - HMO | Admitting: Pain Medicine

## 2016-05-31 ENCOUNTER — Encounter: Payer: Self-pay | Admitting: Pain Medicine

## 2016-05-31 VITALS — BP 170/88 | HR 60 | Temp 98.3°F | Resp 16 | Ht 68.0 in | Wt 165.0 lb

## 2016-05-31 DIAGNOSIS — G8918 Other acute postprocedural pain: Secondary | ICD-10-CM | POA: Diagnosis not present

## 2016-05-31 DIAGNOSIS — G588 Other specified mononeuropathies: Secondary | ICD-10-CM | POA: Diagnosis not present

## 2016-05-31 DIAGNOSIS — M792 Neuralgia and neuritis, unspecified: Secondary | ICD-10-CM | POA: Insufficient documentation

## 2016-05-31 DIAGNOSIS — R109 Unspecified abdominal pain: Secondary | ICD-10-CM | POA: Diagnosis not present

## 2016-05-31 MED ORDER — DEXAMETHASONE SODIUM PHOSPHATE 10 MG/ML IJ SOLN
INTRAMUSCULAR | Status: AC
  Administered 2016-05-31: 12:00:00
  Filled 2016-05-31: qty 1

## 2016-05-31 MED ORDER — LIDOCAINE HCL (PF) 1 % IJ SOLN: 10.0000 mL | Freq: Once | INTRAMUSCULAR | Status: DC

## 2016-05-31 MED ORDER — MIDAZOLAM HCL 5 MG/5ML IJ SOLN: 1.0000 mg | INTRAMUSCULAR | Status: DC | PRN

## 2016-05-31 MED ORDER — ROPIVACAINE HCL 2 MG/ML IJ SOLN: 9.0000 mL | Freq: Once | INTRAMUSCULAR | Status: DC

## 2016-05-31 MED ORDER — LACTATED RINGERS IV SOLN: 1000.0000 mL | Freq: Once | INTRAVENOUS | Status: DC

## 2016-05-31 MED ORDER — HYDROCODONE-ACETAMINOPHEN 5-325 MG PO TABS: 1.0000 | ORAL_TABLET | Freq: Four times a day (QID) | ORAL | 0 refills | Status: DC | PRN

## 2016-05-31 MED ORDER — DEXAMETHASONE SODIUM PHOSPHATE 10 MG/ML IJ SOLN: 10.0000 mg | Freq: Once | INTRAMUSCULAR | Status: DC

## 2016-05-31 MED ORDER — FENTANYL CITRATE (PF) 100 MCG/2ML IJ SOLN: 25.0000 ug | INTRAMUSCULAR | Status: DC | PRN

## 2016-05-31 MED ORDER — MIDAZOLAM HCL 5 MG/5ML IJ SOLN
INTRAMUSCULAR | Status: AC
  Filled 2016-05-31: qty 5

## 2016-05-31 MED ORDER — ROPIVACAINE HCL 2 MG/ML IJ SOLN
INTRAMUSCULAR | Status: AC
  Administered 2016-05-31: 12:00:00
  Filled 2016-05-31: qty 10

## 2016-05-31 MED ORDER — FENTANYL CITRATE (PF) 100 MCG/2ML IJ SOLN
INTRAMUSCULAR | Status: AC
  Filled 2016-05-31: qty 2

## 2016-05-31 NOTE — Patient Instructions (Signed)
Please complete post procedure diary. You were given one prescription for Hydrocodone. Pain Management Discharge Instructions  General Discharge Instructions :  If you need to reach your doctor call: Monday-Friday 8:00 am - 4:00 pm at [phone] or toll free [phone].  After clinic hours [phone] to have operator reach doctor.  Bring all of your medication bottles to all your appointments in the pain clinic.  To cancel or reschedule your appointment with Pain Management please remember to call 24 hours in advance to avoid a fee.  Refer to the educational materials which you have been given on: General Risks, I had my Procedure. Discharge Instructions, Post Sedation.  Post Procedure Instructions:  The drugs you were given will stay in your system until tomorrow, so for the next 24 hours you should not drive, make any legal decisions or drink any alcoholic beverages.  You may eat anything you prefer, but it is better to start with liquids then soups and crackers, and gradually work up to solid foods.  Please notify your doctor immediately if you have any unusual bleeding, trouble breathing or pain that is not related to your normal pain.  Depending on the type of procedure that was done, some parts of your body may feel week and/or numb.  This usually clears up by tonight or the next day.  Walk with the use of an assistive device or accompanied by an adult for the 24 hours.  You may use ice on the affected area for the first 24 hours.  Put ice in a Ziploc bag and cover with a towel and place against area 15 minutes on 15 minutes off.  You may switch to heat after 24 hours.

## 2016-05-31 NOTE — Progress Notes (Signed)
Safety precautions to be maintained throughout the outpatient stay will include: orient to surroundings, keep bed in low position, maintain call bell within reach at all times, provide assistance with transfer out of bed and ambulation.  

## 2016-05-31 NOTE — Progress Notes (Signed)
Patient's Name: Stephen Leon  MRN: [id number]  Referring Provider: Shawna Orleans, Doe-Hyun R, DO  DOB: 09-23-1945  PCP: Drema Pry, DO  DOS: 05/31/2016  Note by: Kathlen Brunswick. Dossie Arbour, MD  Service setting: Ambulatory outpatient  Location: [facility] (AMB) Pain Management Facility  Visit type: Procedure  Specialty: Interventional Pain Management  Patient type: Established   Primary Reason for Visit: Interventional Pain Management Treatment. 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 Intercostal neuralgia (T10 & T11) (Right)   2. Acute postoperative pain    [name] has either failed to respond, was unable to tolerate, or simply did not get enough benefit from other more conservative therapies including, but not limited to: 1. Over-the-counter medications 2. Anti-inflammatory medications 3. Muscle relaxants 4. Membrane stabilizers 5. Opioids 6. Physical therapy 7. Modalities (Heat, ice, etc.) 8. Invasive techniques such as nerve blocks. [name] has attained more than 50% relief of the pain from a series of diagnostic injections conducted in separate occasions.  Pain Score: Pre-procedure: 2 /10 Post-procedure: 2 /10  Pre-Procedure Assessment:  [name] is a 70 y.o. (year old), male patient, seen today for interventional treatment. He  has a past surgical history that includes Hernia repair.. 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 A diagnosis of Acute postoperative pain was also pertinent to this visit.  Pain Descriptors / Indicators: Aching, Stabbing Pain Frequency: Intermittent  Date of Last Visit: 05/12/16 Service Provided on Last Visit: Evaluation  Coagulation Parameters Lab Results  Component Value Date   PLT 296 07/06/2007   Verification of the correct person, correct site (including marking of site), and correct procedure were performed and confirmed by the patient.  Consent: Before the procedure and under the influence of no sedative(s), amnesic(s), or anxiolytics, the patient was informed of the treatment options, risks and possible complications. To fulfill our ethical and legal obligations, as recommended by the American Medical Association's Code of Ethics, I have informed the patient of my clinical impression; the nature and purpose of the treatment or procedure; the risks, benefits, and possible complications of the intervention; the alternatives, including doing nothing; the risk(s) and benefit(s) of the alternative treatment(s) or procedure(s); and the risk(s) and benefit(s) of doing nothing. The patient was provided information about the general risks and possible complications associated with the procedure. These may include, but are not limited to: failure to achieve desired goals, infection, bleeding, organ or nerve damage, allergic reactions, paralysis, and death. In addition, the patient was informed of those risks and complications associated to the procedure, such as failure to decrease pain; infection; bleeding; organ or nerve damage with subsequent damage to sensory, motor, and/or autonomic systems, resulting in permanent pain, numbness, and/or weakness of one or several areas of the body; allergic reactions; (i.e.: anaphylactic reaction); and/or death. Furthermore, the  patient was informed of those risks and complications associated with the medications. These include, but are not limited to: allergic reactions (i.e.: anaphylactic or anaphylactoid reaction(s)); adrenal  axis suppression; blood sugar elevation that in diabetics may result in ketoacidosis or comma; water retention that in patients with history of congestive heart failure may result in shortness of breath, pulmonary edema, and decompensation with resultant heart failure; weight gain; swelling or edema; medication-induced neural toxicity; particulate matter embolism and blood vessel occlusion with resultant organ, and/or nervous system infarction; and/or aseptic necrosis of one or more joints. Finally, the patient was informed that Medicine is not an exact science; therefore, there is also the possibility of unforeseen or unpredictable risks and/or possible complications that may result in a catastrophic outcome. The patient indicated having understood very clearly. We have given the patient no guarantees and we have made no promises. Enough time was given to the patient to ask questions, all of which were answered to the patient's satisfaction. Mr. [name] has indicated that he wanted to continue with the procedure.  Consent Attestation: I, the ordering provider, attest that I have discussed with the patient the benefits, risks, side-effects, alternatives, likelihood of achieving goals, and potential problems during recovery for the procedure that I have provided informed consent.  Pre-Procedure Preparation:  Safety Precautions: Allergies reviewed. The patient was asked about blood thinners, or active infections, both of which were denied. The patient was asked to confirm the procedure and laterality, before marking the site, and again before commencing the procedure. Appropriate site, procedure, and patient were confirmed by following the Joint Commission's Universal Protocol (UP.01.01.01), in the  form of a "Time Out". The patient was asked to participate by confirming the accuracy of the "Time Out" information. Patient was assessed for positional comfort and pressure points before starting the procedure. Allergies: He is allergic to codeine. Allergy Precautions: None required Infection Control Precautions: Sterile technique used. Standard Universal Precautions were taken as recommended by the Department of [facility] for Disease Control and Prevention (CDC). Standard pre-surgical skin prep was conducted. Respiratory hygiene and cough etiquette was practiced. Hand hygiene observed. Safe injection practices and needle disposal techniques followed. SDV (single dose vial) medications used. Medications properly checked for expiration dates and contaminants. Personal protective equipment (PPE) used as per protocol. Monitoring:  As per clinic protocol. Vitals:   05/31/16 1236 05/31/16 1246 05/31/16 1256 05/31/16 1306  BP: (!) 165/93 (!) 205/98 (!) 186/94 (!) 170/88  Pulse:  60 60 60  Resp: '19 15 17 16  '$ Temp:      TempSrc:      SpO2: 97% 96% 96% 93%  Weight:      Height:      Calculated BMI: Body mass index is 25.09 kg/m. Time-out: "Time-out" completed before starting procedure, as per protocol.  Description of Procedure Process:   Time-out: "Time-out" completed before starting procedure, as per protocol. Target Area: The sub-costal neurovascular bundle area Approach: Sub-costal approach Area Prepped: Entire Postero-lateral Thoracic Region Prepping solution: Hibiclens (4.0% Chlorhexidine gluconate solution) Safety Precautions: Aspiration looking for blood return was conducted prior to all injections. At no point did we inject any substances, as a needle was being advanced. No attempts were made at seeking any paresthesias. Safe injection practices and needle disposal techniques used. Medications properly checked for expiration dates. SDV (single dose vial) medications  used. Description of the Procedure: Protocol guidelines were followed. The patient was placed in position over the procedure table. The target area was identified and the area prepped in the usual manner. Skin & deeper tissues infiltrated with local anesthetic. After cleaning the skin with an antiseptic solution, 1-2 mL of  dilute local anesthetic was infiltrated subcutaneously at the planned injection site. The fingers of the palpating hand were used to straddle the insertion site at the inferior border of the rib and fix the skin to avoid unwanted skin movement. Appropriate amount of time allowed to pass for local anesthetics to take effect. The procedure needles were then advanced to the target area at an angle of approximately 20 cephalad to the skin. Contact with the rib was made. While maintaining the same angle of insertion, the needle was walked off the inferior border of the rib as the skin was allowed to return to its initial position. Then the needle was advanced no more than 3 mm below the inferior margin of the rib. Proper needle placement was secured. Negative aspiration confirmed. Following negative aspiration for blood or air, 3-5 mL of local anesthetic was injected. The solution injected in intermittent fashion, asking for systemic symptoms every 0.5cc of injectate. The needle was then removed and the area cleansed, making sure to leave some of the prepping solution back to take advantage of its long term bactericidal properties.  EBL: None Materials & Medications:  Needle(s) Type: Teflon-coated Radiofrequency needle(s) Gauge: 22G Length: 1.5-in Medication(s): We administered ropivacaine (PF) 2 mg/ml (0.2%) and dexamethasone. Please see chart orders for dosing details.  Imaging Guidance (Non-Spinal):  Type of Imaging Technique: Fluoroscopy Guidance (Non-Spinal) Indication(s): Assistance in needle guidance and placement for procedures requiring needle placement in or near specific  anatomical locations not easily accessible without such assistance. Exposure Time: Please see nurses notes. Contrast: None used. Fluoroscopic Guidance: I was personally present during the use of fluoroscopy. "Tunnel Vision Technique" used to obtain the best possible view of the target area. Parallax error corrected before commencing the procedure. "Direction-depth-direction" technique used to introduce the needle under continuous pulsed fluoroscopy. Once target was reached, antero-posterior, oblique, and lateral fluoroscopic projection used confirm needle placement in all planes. Images permanently stored in EMR. Interpretation: No contrast injected. I personally interpreted the imaging intraoperatively. Adequate needle placement confirmed in multiple planes. Permanent images saved into the patient's record.  Antibiotic Prophylaxis:  Indication(s): No indications identified. Type:  Antibiotics Given (last 72 hours)    None      Post-operative Assessment:  Complications: No immediate post-treatment complications observed by team, or reported by patient. Disposition: The patient tolerated the entire procedure well. A repeat set of vitals were taken after the procedure and the patient was kept under observation following institutional policy, for this type of procedure. Post-procedural neurological assessment was performed, showing return to baseline, prior to discharge. The patient was provided with post-procedure discharge instructions, including a section on how to identify potential problems. Should any problems arise concerning this procedure, the patient was given instructions to immediately contact us, at any time, without hesitation. In any case, we plan to contact the patient by telephone for a follow-up status report regarding this interventional procedure. Comments:  No additional relevant information.  Plan of Care  Discharge to: Discharge home  Medications ordered for procedure: Meds  ordered this encounter  Medications  . fentaNYL (SUBLIMAZE) 100 MCG/2ML injection    Sharlett Iles, Delores: cabinet override  . midazolam (VERSED) 5 MG/5ML injection    Sharlett Iles, Delores: cabinet override  . ropivacaine (PF) 2 mg/ml (0.2%) (NAROPIN) 2 MG/ML epidural    Sharlett Iles, Delores: cabinet override  . fentaNYL (SUBLIMAZE) injection 25-50 mcg    Make sure Narcan is available in the pyxis when using this medication. In the event of respiratory depression (  RR< 8/min): Titrate NARCAN (naloxone) in increments of 0.1 to 0.2 mg IV at 2-3 minute intervals, until desired degree of reversal.  . lactated ringers infusion 1,000 mL  . midazolam (VERSED) 5 MG/5ML injection 1-2 mg    Make sure Flumazenil is available in the pyxis when using this medication. If oversedation occurs, administer 0.2 mg IV over 15 sec. If after 45 sec no response, administer 0.2 mg again over 1 min; may repeat at 1 min intervals; not to exceed 4 doses (1 mg)  . dexamethasone (DECADRON) injection 10 mg  . lidocaine (PF) (XYLOCAINE) 1 % injection 10 mL  . ropivacaine (PF) 2 mg/ml (0.2%) (NAROPIN) epidural 9 mL  . HYDROcodone-acetaminophen (NORCO/VICODIN) 5-325 MG tablet    Sig: Take 1 tablet by mouth every 6 (six) hours as needed for moderate pain.    Dispense:  120 tablet    Refill:  0    Do not place this medication, or any other prescription from our practice, on "Automatic Refill". Patient may have prescription filled one day early if pharmacy is closed on scheduled refill date. Do not fill until: 05/31/16 To last until: 06/30/16  . dexamethasone (DECADRON) 10 MG/ML injection    Sharlett Iles, Delores: cabinet override   Medications administered: (For more details, see medical record) We administered ropivacaine (PF) 2 mg/ml (0.2%) and dexamethasone.  Imaging Ordered: Results for orders placed in visit on 05/31/16  DG C-Arm 1-60 Min-No Report   Narrative CLINICAL DATA: Assistance in needle guidance and placement for  procedures  requiring needle placement in or near specific anatomical locations not  easily accessible without such assistance.   C-ARM 1-60 MINUTES  Fluoroscopy was utilized by the requesting physician.  No radiographic  interpretation.     New Prescriptions   HYDROCODONE-ACETAMINOPHEN (NORCO/VICODIN) 5-325 MG TABLET    Take 1 tablet by mouth every 6 (six) hours as needed for moderate pain.   Physician-requested Follow-up:  Return in about 6 weeks (around 07/12/2016) for Post-Procedure evaluation.  Future Appointments Date Time Provider [place]  07/11/2016 1:00 PM Milinda Pointer, MD [facility] None   Primary Care Physician: Drema Pry, DO Location: [facility] Note by: Kathlen Brunswick. Dossie Arbour, M.D, DABA, DABAPM, DABPM, DABIPP, FIPP  Disclaimer:  Medicine is not an exact science. The only guarantee in medicine is that nothing is guaranteed. It is important to note that the decision to proceed with this intervention was based on the information collected from the patient. The Data and conclusions were drawn from the patient's questionnaire, the interview, and the physical examination. Because the information was provided in large part by the patient, it cannot be guaranteed that it has not been purposely or unconsciously manipulated. Every effort has been made to obtain as much relevant data as possible for this evaluation. It is important to note that the conclusions that lead to this procedure are derived in large part from the available data. Always take into account that the treatment will also be dependent on availability of resources and existing treatment guidelines, considered by other Pain Management Practitioners as being common knowledge and practice, at the time of the intervention. For Medico-Legal purposes, it is also important to point out that variation in procedural techniques and pharmacological choices are the acceptable norm. The  indications, contraindications, technique, and results of the above procedure should only be interpreted and judged by a Board-Certified Interventional Pain Specialist with extensive familiarity and expertise in the same exact procedure and technique. Attempts at providing  opinions without similar or greater experience and expertise than that of the treating physician will be considered as inappropriate and unethical, and shall result in a formal complaint to the state medical board and applicable specialty societies.  Instructions provided at this appointment: Patient Instructions  Please complete post procedure diary. You were given one prescription for Hydrocodone. Pain Management Discharge Instructions  General Discharge Instructions :  If you need to reach your doctor call: Monday-Friday 8:00 am - 4:00 pm at [phone] or toll free [phone].  After clinic hours [phone] to have operator reach doctor.  Bring all of your medication bottles to all your appointments in the pain clinic.  To cancel or reschedule your appointment with Pain Management please remember to call 24 hours in advance to avoid a fee.  Refer to the educational materials which you have been given on: General Risks, I had my Procedure. Discharge Instructions, Post Sedation.  Post Procedure Instructions:  The drugs you were given will stay in your system until tomorrow, so for the next 24 hours you should not drive, make any legal decisions or drink any alcoholic beverages.  You may eat anything you prefer, but it is better to start with liquids then soups and crackers, and gradually work up to solid foods.  Please notify your doctor immediately if you have any unusual bleeding, trouble breathing or pain that is not related to your normal pain.  Depending on the type of procedure that was done, some parts of your body may feel week and/or numb.  This usually clears up by tonight or the next day.  Walk with  the use of an assistive device or accompanied by an adult for the 24 hours.  You may use ice on the affected area for the first 24 hours.  Put ice in a Ziploc bag and cover with a towel and place against area 15 minutes on 15 minutes off.  You may switch to heat after 24 hours.

## 2016-06-01 NOTE — Telephone Encounter (Signed)
Patient verbalizes no complications or concerns from procedure on yesterday.

## 2016-06-13 DIAGNOSIS — Z125 Encounter for screening for malignant neoplasm of prostate: Secondary | ICD-10-CM | POA: Diagnosis not present

## 2016-06-13 DIAGNOSIS — M792 Neuralgia and neuritis, unspecified: Secondary | ICD-10-CM | POA: Diagnosis not present

## 2016-06-13 DIAGNOSIS — C801 Malignant (primary) neoplasm, unspecified: Secondary | ICD-10-CM | POA: Diagnosis not present

## 2016-06-13 DIAGNOSIS — J410 Simple chronic bronchitis: Secondary | ICD-10-CM | POA: Diagnosis not present

## 2016-06-16 DIAGNOSIS — Z23 Encounter for immunization: Secondary | ICD-10-CM | POA: Diagnosis not present

## 2016-06-16 DIAGNOSIS — I10 Essential (primary) hypertension: Secondary | ICD-10-CM | POA: Diagnosis not present

## 2016-06-16 DIAGNOSIS — C801 Malignant (primary) neoplasm, unspecified: Secondary | ICD-10-CM | POA: Diagnosis not present

## 2016-06-16 DIAGNOSIS — Z0000 Encounter for general adult medical examination without abnormal findings: Secondary | ICD-10-CM | POA: Diagnosis not present

## 2016-06-16 DIAGNOSIS — Z720 Tobacco use: Secondary | ICD-10-CM | POA: Diagnosis not present

## 2016-06-16 DIAGNOSIS — J209 Acute bronchitis, unspecified: Secondary | ICD-10-CM | POA: Diagnosis not present

## 2016-06-16 DIAGNOSIS — M792 Neuralgia and neuritis, unspecified: Secondary | ICD-10-CM | POA: Diagnosis not present

## 2016-06-16 DIAGNOSIS — J410 Simple chronic bronchitis: Secondary | ICD-10-CM | POA: Diagnosis not present

## 2016-07-11 ENCOUNTER — Telehealth: Payer: Self-pay | Admitting: *Deleted

## 2016-07-11 ENCOUNTER — Encounter: Payer: Self-pay | Admitting: Pain Medicine

## 2016-07-11 ENCOUNTER — Ambulatory Visit: Payer: Commercial Managed Care - HMO | Attending: Pain Medicine | Admitting: Pain Medicine

## 2016-07-11 VITALS — BP 176/96 | HR 78 | Temp 97.9°F | Resp 16 | Ht 67.0 in | Wt 165.0 lb

## 2016-07-11 DIAGNOSIS — G580 Intercostal neuropathy: Secondary | ICD-10-CM

## 2016-07-11 DIAGNOSIS — F17210 Nicotine dependence, cigarettes, uncomplicated: Secondary | ICD-10-CM | POA: Insufficient documentation

## 2016-07-11 DIAGNOSIS — C3490 Malignant neoplasm of unspecified part of unspecified bronchus or lung: Secondary | ICD-10-CM | POA: Insufficient documentation

## 2016-07-11 DIAGNOSIS — G894 Chronic pain syndrome: Secondary | ICD-10-CM | POA: Insufficient documentation

## 2016-07-11 DIAGNOSIS — I10 Essential (primary) hypertension: Secondary | ICD-10-CM | POA: Insufficient documentation

## 2016-07-11 DIAGNOSIS — F1121 Opioid dependence, in remission: Secondary | ICD-10-CM | POA: Diagnosis not present

## 2016-07-11 DIAGNOSIS — M792 Neuralgia and neuritis, unspecified: Secondary | ICD-10-CM | POA: Diagnosis not present

## 2016-07-11 DIAGNOSIS — G548 Other nerve root and plexus disorders: Secondary | ICD-10-CM | POA: Diagnosis not present

## 2016-07-11 DIAGNOSIS — F1511 Other stimulant abuse, in remission: Secondary | ICD-10-CM | POA: Diagnosis not present

## 2016-07-11 DIAGNOSIS — R109 Unspecified abdominal pain: Secondary | ICD-10-CM | POA: Insufficient documentation

## 2016-07-11 DIAGNOSIS — J209 Acute bronchitis, unspecified: Secondary | ICD-10-CM | POA: Insufficient documentation

## 2016-07-11 DIAGNOSIS — F411 Generalized anxiety disorder: Secondary | ICD-10-CM | POA: Diagnosis not present

## 2016-07-11 MED ORDER — PREGABALIN 75 MG PO CAPS: 75.0000 mg | ORAL_CAPSULE | Freq: Three times a day (TID) | ORAL | 1 refills | Status: DC

## 2016-07-11 NOTE — Progress Notes (Signed)
Patient's Name: Stephen Leon  MRN: [id number]  Referring Provider: Shawna Orleans, Doe-Hyun R, DO  DOB: 05-19-1946  PCP: Rosine Abe, DO  DOS: 07/11/2016  Note by: Kathlen Brunswick. Dossie Arbour, MD  Service setting: Ambulatory outpatient  Specialty: Interventional Pain Management  Location: [facility] (AMB) Pain Management Facility    Patient type: Established   Primary Reason(s) for Visit: Encounter for post-procedure evaluation of chronic illness with mild to moderate exacerbation CC: Abdominal Pain (right)  HPI  [name] is a 70 y.o. year old, male patient, who comes today for a post-procedure evaluation. 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 His primarily concern today is the Abdominal Pain (right)  Pain Assessment: Self-Reported Pain Score: 5 /10 Clinically the patient looks like a 3/10 Reported level is inconsistent with clinical observations. Information on the proper use of the pain score provided to the patient today. Pain Type: Chronic pain Pain Location: Abdomen Pain Orientation: Right Pain Descriptors / Indicators: Aching, Pressure Pain Frequency: Intermittent  [name] comes in today for post-procedure evaluation after the treatment done on 05/31/2016. Unfortunately, his condition is not improving. I initially saw the patient for the first time on 09/28/2015. He was referred to me due to a right sided intercostal pain which started after the removal of his right upper lobe secondary  to cancer. On 10/02/2015 he underwent his first diagnostic T10 and T11 intercostal blocks. This were done at the mid axillary line. The diagnostic procedure provided the patient with 100% relief of the pain for the duration of the local anesthetic, but he clearly also obtained some relief from the steroids. The procedure was done with ropivacaine and 80 mg of Depo-Medrol. This first block provided him with 100% relief of the pain, as documented on the 10/14/2015 note when he came back for a follow-up. At that time his pain score was a 0/10. He did well and did not require any more blocks or medication until around 04/05/2016 when he came back and indicated that his pain was back to a 5/10. On 04/12/2016 I repeated the right T10 and T11 intercostal nerve blocks. When he left the clinic that day he was having 100% relief of the pain. On 05/12/2016 he came back for a follow-up evaluation where he indicated that he had received 100% relief of the pain for the duration of local anesthetic, but this time the pain came back by the end of the second week. The difference here is that instead of injecting Depo-Medrol 80 mg per mL (1 mL), I injected Decadron 10 mg per mL (1 mL). In an attempt to provide him with longer lasting benefit I brought him back on 05/31/2016 for a radiofrequency ablation of the right T10 and T11 intercostal nerves. He returns today 07/11/2016, indicating that the radiofrequency failed to provide him with the long-term benefit that we were looking for.  Review of symptoms: His primary complaint is that of pain in the right upper quadrant region. He indicates that this pain started 4 years ago after having had his right upper lobe removed due to cancer. At the time he was  told that the cancer was secondary to exposure to asbestos. He indicates having served in the [employer] for 14 years where he was exposed to asbestos on a regular basis. He was a little concerned that his thoracic surgery was done  through robotics and since it was while the first one was done, he was thinking that this is a reason why he had developed this intercostal neuralgia. 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 He indicates that he has decreased sensation over the painful area but not complete anesthesia. He also indicates that this did not change after the radiofrequency. He also indicates that when he moves the area that hurts, with his hand, this does not trigger any pain. However, when he does any kind of work, such as shoveling or sweeping, he feels like something moves in the air and it catches. He describes that this sensation is deep and not something that he feels he can touch through the skin.  Review of oral therapy: He indicates that he takes nonsteroidal anti-inflammatory drugs such as Aleve and this seems to help his pain quite a bit. He also has tramadol but it does not work as well as the [place]. For the neuropathic component of the pain he takes gabapentin. He indicates that when he started taking the gabapentin it did seem like it helped. He was started at about 200 mg per day and over time it was increased to 300 mg 3 times a day. I one point, they attempted to increase the dose to 600 mg 3 times a day, but this did not provide him with any additional relief of the pain and he started having some severe side effects with headaches. Based on his description, it would seem that the gabapentin dose/effect curve dissociated at the 300 mg 3 times a day level. He denies having tried any pregabalin (Lyrica). Immediately after his surgery, 4 years ago, he was given OxyContin for the postop pain and he describes having become addicted to the medication. Because of this, he does not want to use any opioids. I did give him a prescription for hydrocodone that he could've used after the radiofrequency, but he indicated that he took a few and decided  not to take it anymore because of his fear of addiction and relapsing into it. Today I have requested that he bring back the hydrocodone that he did not use of that he can be destroyed in front of witnesses. He agreed to do this the next time that he comes back. Today I have decided to taper his gabapentin down by 1 pill every 4 days and then have him do a trial of pregabalin 75 mg 3 times a day, which we will titrate up by starting him at 1 pill per day and will increase it by one additional pill every 4 days until he gets to 3 pills per day. I have informed him that he can start taking his first pill when his gabapentin is down to 1 pill per day.  Review of interventional therapy: Because he has obtained 100% relief of the pain for the duration of the local anesthetics, during the 2 diagnostic blocks, it is my impression that the origin of his pain is distal to the mid axillary line of T10 and T11, on the right side. Because he did not get long-term benefit from the radiofrequency ablation and he also did not experience any long-term decrease in  sensation over the distribution of the T10 and T11, there is always a possibility that I might have missed the intercostal nerve when doing the radiofrequency. Interestingly enough, he attained longer lasting benefit with the Depo-Medrol 80 when compared to the Decadron 10, and he also states that the Aleve works better than the tramadol, suggesting that the nonsteroidal anti-inflammatory drugs addressed better the mechanism of his pain than the medial receptor activation and norepinephrine receptor effects of the tramadol. He also indicates that the opioids don't tend to touch his pain, but the NSAIDs and then membrane stabilizers do. This would suggest the pain to have a neuropathic component associated with an inflammatory component. At this point, possible treatment alternatives were include repeating the radiofrequency at the intercostal level versus doing a T10 and  T11 selective nerve root block with the possibility of following up with radiofrequency. Unfortunately, the thoracic DRG tends to be more intraspinal and a lot more difficult to access to do a radiofrequency ablation.  Plan: At this point my plan is to refer the patient to the [place] for a consult and recommendations. In addition, I will do a trial with the Lyrica and if necessary, we will bring him back and repeat the intercostal nerve blocks using Depo-Medrol 80 mg instead of the Decadron.  Further details on both, my assessment(s), as well as the proposed treatment plan, please see below.  Post-Procedure Assessment  05/31/2016 Procedure: Palliative left T10 and T11 intercostal radiofrequency ablation Influential Factors: BMI: 25.84 kg/m Intra-procedural challenges: None observed Assessment challenges: None detected         Post-procedural side-effects, adverse reactions, or complications: None reported Reported issues: None  Sedation: Sedation provided. When no sedatives are used, the analgesic levels obtained are directly associated to the effectiveness of the local anesthetics. However, when sedation is provided, the level of analgesia obtained during the initial 1 hour following the intervention, is believed to be the result of a combination of factors. These factors may include, but are not limited to: 1. The effectiveness of the local anesthetics used. 2. The effects of the analgesic(s) and/or anxiolytic(s) used. 3. The degree of discomfort experienced by the patient at the time of the procedure. 4. The patients ability and reliability in recalling and recording the events. 5. The presence and influence of possible secondary gains and/or psychosocial factors. Reported result: Relief experienced during the 1st hour after the procedure: 100 % (Ultra-Short Term Relief) Interpretative annotation: Analgesia during this period is likely to be Local Anesthetic and/or IV  Sedative (Analgesic/Anxiolitic) related.          Effects of local anesthetic: The analgesic effects attained during this period are directly associated to the localized infiltration of local anesthetics and therefore cary significant diagnostic value as to the etiological location, or anatomical origin, of the pain. Expected duration of relief is directly dependent on the pharmacodynamics of the local anesthetic used. Long-acting (4-6 hours) anesthetics used.  Reported result: Relief during the next 4 to 6 hour after the procedure: 100 % (Short-Term Relief) Interpretative annotation: Complete relief would suggest area to be the source of the pain.          Long-term benefit: Defined as the period of time past the expected duration of local anesthetics. With the possible exception of prolonged sympathetic blockade from the local anesthetics, benefits during this period are typically attributed to, or associated with, other factors such as analgesic sensory neuropraxia, antiinflammatory effects, or beneficial biochemical changes provided by agents  other than the local anesthetics Reported result: Extended relief following procedure: 0 % (Describes the pain as if a piece of plastic was burned and it becomes very hard. ) (Long-Term Relief) Interpretative annotation: No benefit. Unexpected therapeutic failure.          Current benefits: Defined as persistent relief that continues at this point in time.   Reported results: Treated area: 0 %       Interpretative annotation: No long-term benefit suggesting failure radiofrequency to achieve desired effect.  Interpretation: Results would suggest Radiofrequency failure          Laboratory Chemistry  Inflammation Markers No results found for: ESRSEDRATE, CRP Renal Function Lab Results  Component Value Date   BUN 11 07/06/2007   CREATININE 1.10 08/24/2015   GFRAA 127 07/06/2007   GFRNONAA 105 07/06/2007   Hepatic Function Lab Results  Component  Value Date   AST 20 07/06/2007   ALT 17 07/06/2007   ALBUMIN 3.9 07/06/2007   Electrolytes Lab Results  Component Value Date   NA 141 07/06/2007   K 4.5 07/06/2007   CL 104 07/06/2007   CALCIUM 9.2 07/06/2007   Pain Modulating Vitamins No results found for: Maralyn Sago [id number], [id number], [id number], 25OHVITD1, 25OHVITD2, 25OHVITD3, VITAMINB12 Coagulation Parameters Lab Results  Component Value Date   PLT 296 07/06/2007   Cardiovascular Lab Results  Component Value Date   HGB 14.3 07/06/2007   HCT 41.0 07/06/2007   Note: Lab results reviewed.  Recent Diagnostic Imaging Review  Dg C-arm 1-60 Min-no Report  Result Date: 05/31/2016 CLINICAL DATA: Assistance in needle guidance and placement for procedures requiring needle placement in or near specific anatomical locations not easily accessible without such assistance. C-ARM 1-60 MINUTES Fluoroscopy was utilized by the requesting physician.  No radiographic interpretation.   Note: Imaging results reviewed.  Meds  The patient has a current medication list which includes the following prescription(s): fluticasone furoate-vilanterol, gabapentin, tramadol, and pregabalin.  Current Outpatient Prescriptions on File Prior to Visit  Medication Sig  . gabapentin (NEURONTIN) 400 MG capsule Take 400 mg by mouth 3 (three) times daily. Reported on 09/28/2015  . traMADol (ULTRAM) 50 MG tablet Take 50 mg by mouth every 6 (six) hours as needed.    No current facility-administered medications on file prior to visit.    ROS  Constitutional: Denies any fever or chills Gastrointestinal: No reported hemesis, hematochezia, vomiting, or acute GI distress Musculoskeletal: Denies any acute onset joint swelling, redness, loss of ROM, or weakness Neurological: No reported episodes of acute onset apraxia, aphasia, dysarthria, agnosia, amnesia, paralysis, loss of coordination, or loss of consciousness  Allergies  [name] is allergic to  codeine.  [place]  Drug: [name]  reports that he does not use drugs. Alcohol:  reports that he drinks about 7.2 oz of alcohol per week . Tobacco:  reports that he has been smoking Cigarettes.  He has been smoking about 0.25 packs per day. He has never used smokeless tobacco. 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 Family: family history includes COPD in his mother.  Past Surgical History:  Procedure Laterality Date  . HERNIA REPAIR     Constitutional Exam  General appearance: Well nourished, well developed, and well hydrated. In no apparent acute distress Vitals:   07/11/16 1323  BP: (!) 176/96  Pulse: 78  Resp: 16  Temp: 97.9 F (36.6 C)  TempSrc: Oral  SpO2: 96%  Weight:  165 lb (74.8 kg)  Height: '5\' 7"'$  (1.702 m)   BMI Assessment: Estimated body mass index is 25.84 kg/m as calculated from the following:   Height as of this encounter: '5\' 7"'$  (1.702 m).   Weight as of this encounter: 165 lb (74.8 kg).  BMI interpretation table: BMI level Category Range association with higher incidence of chronic pain  <18 kg/m2 Underweight   18.5-24.9 kg/m2 Ideal body weight   25-29.9 kg/m2 Overweight Increased incidence by 20%  30-34.9 kg/m2 Obese (Class I) Increased incidence by 68%  35-39.9 kg/m2 Severe obesity (Class II) Increased incidence by 136%  >40 kg/m2 Extreme obesity (Class III) Increased incidence by 254%   BMI Readings from Last 4 Encounters:  07/11/16 25.84 kg/m  05/31/16 25.09 kg/m  05/12/16 25.09 kg/m  04/12/16 25.09 kg/m   Wt Readings from Last 4 Encounters:  07/11/16 165 lb (74.8 kg)  05/31/16 165 lb (74.8 kg)  05/12/16 165 lb (74.8 kg)  04/12/16 165 lb (74.8 kg)  Psych/Mental status: Alert, oriented x 3 (person, place, & time) Eyes: PERLA Respiratory: No evidence of acute respiratory distress  Cervical Spine Exam  Inspection: No masses, redness, or swelling Alignment:  Symmetrical Functional ROM: Unrestricted ROM Stability: No instability detected Muscle strength & Tone: Functionally intact Sensory: Unimpaired Palpation: Non-contributory  Upper Extremity (UE) Exam    Side: Right upper extremity  Side: Left upper extremity  Inspection: No masses, redness, swelling, or asymmetry  Inspection: No masses, redness, swelling, or asymmetry  Functional ROM: Unrestricted ROM          Functional ROM: Unrestricted ROM          Muscle strength & Tone: Functionally intact  Muscle strength & Tone: Functionally intact  Sensory: Unimpaired  Sensory: Unimpaired  Palpation: Non-contributory  Palpation: Non-contributory   Thoracic Spine Exam  Inspection: No masses, redness, or swelling Alignment: Symmetrical Functional ROM: Unrestricted ROM Stability: No instability detected Sensory: Unimpaired Muscle strength & Tone: Functionally intact Palpation: Non-contributory  Lumbar Spine Exam  Inspection: No masses, redness, or swelling Alignment: Symmetrical Functional ROM: Unrestricted ROM Stability: No instability detected Muscle strength & Tone: Functionally intact Sensory: Unimpaired Palpation: Non-contributory Provocative Tests: Lumbar Hyperextension and rotation test: evaluation deferred today       Patrick's Maneuver: evaluation deferred today              Gait & Posture Assessment  Ambulation: Unassisted Gait: Relatively normal for age and body habitus Posture: WNL   Lower Extremity Exam    Side: Right lower extremity  Side: Left lower extremity  Inspection: No masses, redness, swelling, or asymmetry  Inspection: No masses, redness, swelling, or asymmetry  Functional ROM: Unrestricted ROM          Functional ROM: Unrestricted ROM          Muscle strength & Tone: Functionally intact  Muscle strength & Tone: Functionally intact  Sensory: Unimpaired  Sensory: Unimpaired  Palpation: Non-contributory  Palpation: Non-contributory   Assessment  Primary  Diagnosis & Pertinent Problem List: The primary encounter diagnosis was Chronic pain syndrome. 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  Visit Diagnosis: 1. Chronic pain syndrome   2. History of narcotic addiction ([place])   3. 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 pregabalin (LYRICA) 75 MG capsule    Sig: Take 1 capsule (75 mg total) by mouth 3 (three) times daily.    Dispense:  90 capsule    Refill:  1    Do not place this medication, or any other prescription from our practice, on "Automatic Refill". Patient may have prescription filled one day early if pharmacy is closed on scheduled refill date.   New Prescriptions   PREGABALIN (LYRICA) 75 MG CAPSULE    Take 1 capsule (75 mg total) by mouth 3 (three) times daily.   Medications administered today: [name] had no medications administered during this visit. Lab-work, procedure(s), and/or referral(s): Orders Placed This Encounter  Procedures  . Ambulatory referral to Pain Clinic   Imaging and/or referral(s): AMB REFERRAL TO PAIN CLINIC: Referral to the [facility] for second opinion and possible recommendations.  Interventional therapies: Planned, scheduled, and/or pending:   None at this time.   Considering:   Repeat right T10 and T11 intercostal nerve block with Depo-Medrol 80. Diagnostic right T10 and T11 selective nerve root blocks. Possible right T10 and T11 nerve root radiofrequency ablation.  Possible right T10 and T11 selective into the spinal high frequency DRG neurostimulation.   Palliative PRN treatment(s):   Not at this time.   Provider-requested follow-up: Return in about 6  weeks (around 08/22/2016) for Med-Mgmt.  Future Appointments Date Time Provider [place]  08/22/2016 2:00 PM Milinda Pointer, MD [facility] None   Primary Care Physician: Rosine Abe, DO Location: [facility] Facility Note by: Kathlen Brunswick. Dossie Arbour, M.D, DABA, DABAPM, DABPM, DABIPP, FIPP 12/04/176:01 PM  Pain Score Disclaimer: We use the NRS-11 scale. This is a self-reported, subjective measurement of pain severity with only modest accuracy. It is used primarily to identify changes within a particular patient. It must be understood that outpatient pain scales are significantly less accurate that those used for research, where they can be applied under ideal controlled circumstances with minimal exposure to variables. In reality, the score is likely to be a combination of pain intensity and pain affect, where pain affect describes the degree of emotional arousal or changes in action readiness caused by the sensory experience of pain. Factors such as social and work situation, setting, emotional state, anxiety levels, expectation, and prior pain experience may influence pain perception and show large inter-individual differences that may also be affected by time variables.  Patient instructions provided during this appointment: There are no Patient Instructions on file for this visit.

## 2016-07-18 ENCOUNTER — Telehealth: Payer: Self-pay

## 2016-07-18 NOTE — Telephone Encounter (Signed)
Dr. Dossie Arbour was going to change pts meds from gabapentin to lyrica but pt does not want to change because of the side effects of lyrica please call pt asap

## 2016-07-18 NOTE — Telephone Encounter (Signed)
Mr Stephen Leon phoned into to let us know that he does not want to begin Lyrica.  He began the taper of the Gabapentin and is now taking 2 per day.  His pain seems to be much better and he feels that he does not want to begin the Lyrica, which he feels will be stronger than the Gabapentin since he is doing so much better. Patient will continue Gabapentin 2 tablets per day and will f/up at his appt on August 22, 2016

## 2016-08-22 ENCOUNTER — Ambulatory Visit: Payer: Commercial Managed Care - HMO | Admitting: Pain Medicine

## 2016-09-04 DIAGNOSIS — R0781 Pleurodynia: Secondary | ICD-10-CM | POA: Diagnosis not present

## 2016-09-04 DIAGNOSIS — S2231XA Fracture of one rib, right side, initial encounter for closed fracture: Secondary | ICD-10-CM | POA: Diagnosis not present

## 2016-09-06 ENCOUNTER — Emergency Department (HOSPITAL_COMMUNITY): Payer: Medicare HMO

## 2016-09-06 ENCOUNTER — Encounter (HOSPITAL_COMMUNITY): Payer: Self-pay | Admitting: Emergency Medicine

## 2016-09-06 ENCOUNTER — Emergency Department (HOSPITAL_COMMUNITY)
Admission: EM | Admit: 2016-09-06 | Discharge: 2016-09-06 | Disposition: A | Discharge status: Home / Self Care | Payer: Medicare HMO | Attending: Emergency Medicine | Admitting: Emergency Medicine

## 2016-09-06 DIAGNOSIS — R0781 Pleurodynia: Secondary | ICD-10-CM | POA: Diagnosis not present

## 2016-09-06 DIAGNOSIS — Z85118 Personal history of other malignant neoplasm of bronchus and lung: Secondary | ICD-10-CM | POA: Insufficient documentation

## 2016-09-06 DIAGNOSIS — I10 Essential (primary) hypertension: Secondary | ICD-10-CM | POA: Insufficient documentation

## 2016-09-06 DIAGNOSIS — J449 Chronic obstructive pulmonary disease, unspecified: Secondary | ICD-10-CM | POA: Insufficient documentation

## 2016-09-06 DIAGNOSIS — R0789 Other chest pain: Secondary | ICD-10-CM | POA: Insufficient documentation

## 2016-09-06 DIAGNOSIS — R079 Chest pain, unspecified: Secondary | ICD-10-CM | POA: Diagnosis not present

## 2016-09-06 DIAGNOSIS — F17210 Nicotine dependence, cigarettes, uncomplicated: Secondary | ICD-10-CM | POA: Insufficient documentation

## 2016-09-06 LAB — CBC WITH DIFFERENTIAL/PLATELET
BASOS ABS: 0 10*3/uL (ref 0.0–0.1)
Basophils Relative: 0 %
Eosinophils Absolute: 0.1 10*3/uL (ref 0.0–0.7)
Eosinophils Relative: 1 %
HEMATOCRIT: 48.5 % (ref 39.0–52.0)
HEMOGLOBIN: 16.8 g/dL (ref 13.0–17.0)
LYMPHS PCT: 21 %
Lymphs Abs: 1.8 10*3/uL (ref 0.7–4.0)
MCH: 33.7 pg (ref 26.0–34.0)
MCHC: 34.6 g/dL (ref 30.0–36.0)
MCV: 97.2 fL (ref 78.0–100.0)
MONOS PCT: 16 %
Monocytes Absolute: 1.4 10*3/uL — ABNORMAL HIGH (ref 0.1–1.0)
NEUTROS ABS: 5.3 10*3/uL (ref 1.7–7.7)
NEUTROS PCT: 62 %
Platelets: 213 10*3/uL (ref 150–400)
RBC: 4.99 MIL/uL (ref 4.22–5.81)
RDW: 14.7 % (ref 11.5–15.5)
WBC: 8.6 10*3/uL (ref 4.0–10.5)

## 2016-09-06 LAB — I-STAT CHEM 8, ED
BUN: 15 mg/dL (ref 6–20)
CREATININE: 0.8 mg/dL (ref 0.61–1.24)
Calcium, Ion: 1.04 mmol/L — ABNORMAL LOW (ref 1.15–1.40)
Chloride: 99 mmol/L — ABNORMAL LOW (ref 101–111)
GLUCOSE: 92 mg/dL (ref 65–99)
HCT: 51 % (ref 39.0–52.0)
HEMOGLOBIN: 17.3 g/dL — AB (ref 13.0–17.0)
POTASSIUM: 4.2 mmol/L (ref 3.5–5.1)
Sodium: 136 mmol/L (ref 135–145)
TCO2: 27 mmol/L (ref 0–100)

## 2016-09-06 MED ORDER — DICLOFENAC SODIUM 1 % TD GEL: 2.0000 g | Freq: Four times a day (QID) | TRANSDERMAL | 1 refills | Status: DC

## 2016-09-06 MED ORDER — IOPAMIDOL (ISOVUE-370) INJECTION 76%
INTRAVENOUS | Status: AC
  Filled 2016-09-06: qty 100

## 2016-09-06 MED ORDER — IOPAMIDOL (ISOVUE-370) INJECTION 76%
100.0000 mL | Freq: Once | INTRAVENOUS | Status: AC | PRN
  Administered 2016-09-06: 100 mL via INTRAVENOUS

## 2016-09-06 MED ORDER — ONDANSETRON HCL 4 MG PO TABS: 4.0000 mg | ORAL_TABLET | Freq: Four times a day (QID) | ORAL | 0 refills | Status: DC

## 2016-09-06 MED ORDER — ONDANSETRON HCL 4 MG/2ML IJ SOLN
4.0000 mg | Freq: Once | INTRAMUSCULAR | Status: AC
  Administered 2016-09-06: 4 mg via INTRAVENOUS
  Filled 2016-09-06: qty 2

## 2016-09-06 MED ORDER — MORPHINE SULFATE (PF) 4 MG/ML IV SOLN
4.0000 mg | Freq: Once | INTRAVENOUS | Status: AC
  Administered 2016-09-06: 4 mg via INTRAVENOUS
  Filled 2016-09-06: qty 1

## 2016-09-06 MED ORDER — SODIUM CHLORIDE 0.9 % IV BOLUS (SEPSIS)
1000.0000 mL | Freq: Once | INTRAVENOUS | Status: AC
  Administered 2016-09-06: 1000 mL via INTRAVENOUS

## 2016-09-06 MED ORDER — CYCLOBENZAPRINE HCL 10 MG PO TABS: 10.0000 mg | ORAL_TABLET | Freq: Two times a day (BID) | ORAL | 0 refills | Status: DC | PRN

## 2016-09-06 NOTE — ED Provider Notes (Signed)
[facility] DEPT Provider Note   CSN: [id number] Arrival date & time: 09/06/16  3244     History   Chief Complaint Chief Complaint  Patient presents with  . Chest Pain    HPI Stephen Leon is a 71 y.o. male.  The history is provided by the patient and the spouse.  Chest Pain   This is a new problem. Episode onset: 6 days ago. The problem occurs constantly. The problem has been rapidly worsening. Associated with: pt was putting and child jumper in the doorway on thursday and felt a pop on the right side which initially wasn't that bad but then started to worsen. The pain is present in the lateral region. The pain is at a severity of 10/10. The pain is severe. The quality of the pain is described as sharp and stabbing. The pain radiates to the mid back and right neck. The symptoms are aggravated by deep breathing and certain positions. Associated symptoms include nausea, shortness of breath and vomiting. Pertinent negatives include no abdominal pain, no cough, no dizziness, no fever, no lower extremity edema and no sputum production. He has tried rest for the symptoms. The treatment provided no relief. Past medical history comments: hx of COPD, lung CA s/p resection    Past Medical History:  Diagnosis Date  . Cancer ([place])   . Cough 07/13/2007   Qualifier: Diagnosis of  By: Wynona Luna   . Hypertension   . LUNG NODULE 07/13/2007   Qualifier: Diagnosis of  By: Wynona Luna     Patient Active Problem List   Diagnosis Date Noted  . Chronic pain syndrome 07/11/2016  . 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 GAD (generalized anxiety disorder) 12/15/2015  . 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 COPD (chronic obstructive pulmonary disease) ([facility]) 09/28/2015  . Malignant neoplastic disease ([place]) 09/28/2015  . Acute  bronchitis with bronchospasm 08/28/2015  . Essential (primary) hypertension 06/16/2015  . 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 Cancer of lung ([place]) 05/16/2012  . Tobacco user 05/04/2012  . Non-small cell carcinoma of lung ([place]) 05/04/2012    Past Surgical History:  Procedure Laterality Date  . HERNIA REPAIR         Home Medications    Prior to Admission medications   Medication Sig Start Date End Date Taking? Authorizing Provider  albuterol (PROVENTIL HFA;VENTOLIN HFA) 108 (90 Base) MCG/ACT inhaler Inhale 2 puffs into the lungs every 6 (six) hours as needed for wheezing or shortness of breath.   Yes Historical Provider, MD  ENSURE (ENSURE) Take 237 mLs by mouth daily at 12 noon.   Yes Historical Provider, MD  fluticasone furoate-vilanterol (BREO ELLIPTA) 100-25 MCG/INH AEPB Inhale 1 puff into the lungs daily.  06/16/16  Yes Historical Provider, MD  gabapentin (NEURONTIN) 400 MG capsule Take 400 mg by mouth 3 (three) times daily. Reported on 09/28/2015 08/28/15 09/06/16 Yes Historical Provider, MD  ibuprofen (ADVIL,MOTRIN) 200 MG tablet Take 400 mg by mouth 2 (two) times daily.   Yes Historical Provider, MD  pregabalin (LYRICA) 75 MG capsule Take 1 capsule (75 mg total) by mouth 3 (three) times daily. Patient not taking: Reported on 09/06/2016 07/11/16 09/09/16  Milinda Pointer, MD    Family History Family History  Problem Relation Age of Onset  . COPD Mother     Social History Social History  Substance Use Topics  . Smoking status: Current Some Day Smoker    Packs/day: 0.25    Types: Cigarettes  . Smokeless tobacco: Never Used  . Alcohol use 7.2 oz/week    12 Standard drinks or equivalent per week     Comment: Drinks 3-4 beers every night     Allergies   Codeine   Review of Systems Review of Systems  Constitutional: Negative for fever.  Respiratory: Positive for shortness of breath. Negative for cough and sputum production.   Cardiovascular: Positive for chest  pain.  Gastrointestinal: Positive for nausea and vomiting. Negative for abdominal pain.  Neurological: Negative for dizziness.  All other systems reviewed and are negative.    Physical Exam Updated Vital Signs BP 150/91   Pulse 66   Temp 97.7 F (36.5 C) (Oral)   Resp 15   Ht '5\' 8"'$  (1.727 m)   Wt 165 lb (74.8 kg)   SpO2 91%   BMI 25.09 kg/m   Physical Exam  Constitutional: He is oriented to person, place, and time. He appears well-developed and well-nourished. No distress.  HENT:  Head: Normocephalic and atraumatic.  Mouth/Throat: Oropharynx is clear and moist. Mucous membranes are dry.  Eyes: Conjunctivae and EOM are normal. Pupils are equal, round, and reactive to light.  Neck: Normal range of motion. Neck supple.  Cardiovascular: Normal rate, regular rhythm and intact distal pulses.   No murmur heard. Pulmonary/Chest: Effort normal and breath sounds normal. No respiratory distress. He has no wheezes. He has no rales.     He exhibits tenderness. He exhibits no bony tenderness and no crepitus.    Abdominal: Soft. He exhibits no distension. There is no tenderness. There is no rebound and no guarding.  Musculoskeletal: Normal range of motion. He exhibits no edema or tenderness.  Neurological: He is alert and oriented to person, place, and time.  Skin: Skin is warm and dry. No rash noted. No erythema.  Psychiatric: He has a normal mood and affect. His behavior is normal.  Nursing note and vitals reviewed.    ED Treatments / Results  Labs (all labs ordered are listed, but only abnormal results are displayed) Labs Reviewed  CBC WITH DIFFERENTIAL/PLATELET - Abnormal; Notable for the following:       Result Value   Monocytes Absolute 1.4 (*)    All other components within normal limits  I-STAT CHEM 8, ED - Abnormal; Notable for the following:    Chloride 99 (*)    Calcium, Ion 1.04 (*)    Hemoglobin 17.3 (*)    All other components within normal limits    EKG   EKG Interpretation None       Radiology Dg Ribs Unilateral W/chest Right  Result Date: 09/06/2016 CLINICAL DATA:  Right posterior lower rib pain for 6 days EXAM: RIGHT RIBS AND CHEST - 3+ VIEW COMPARISON:  None. FINDINGS: No fracture or other bone lesions are seen involving the ribs. There is no evidence of pneumothorax or pleural effusion. Both lungs are clear. Heart size and mediastinal contours are within normal limits. IMPRESSION: Negative. Electronically Signed   By: Kathreen Devoid   On: 09/06/2016 13:00   Ct Angio Chest Pe W Or Wo Contrast  Result Date: 09/06/2016 CLINICAL DATA:  Right side chest pain, hypoxia, history of right lung cancer and right upper lobectomy for years ago EXAM: CT ANGIOGRAPHY CHEST WITH CONTRAST TECHNIQUE: Multidetector CT imaging of the chest was performed using the standard protocol during bolus administration of  intravenous contrast. Multiplanar CT image reconstructions and MIPs were obtained to evaluate the vascular anatomy. CONTRAST:  100 cc Isovue COMPARISON:  CT chest 08/24/2015 FINDINGS: Cardiovascular: Atherosclerotic calcifications of thoracic aorta. Atherosclerotic calcifications of coronary arteries. Heart size within normal limits. No pericardial effusion. The study is of excellent technical quality. No pulmonary embolus is noted. Mediastinum/Nodes: There is no mediastinal hematoma or adenopathy. Right lower pretracheal lymph node measures 9 mm short-axis not pathologic by size criteria. No hilar adenopathy is noted. Lungs/Pleura: Again noted status post right upper lobectomy. No recurrent mass. Images of the lung parenchyma shows no infiltrate or pulmonary edema. Minimal dependent atelectasis bilateral lower lobe posteriorly. Mild emphysematous changes are noted bilaterally. Anterior calcified pleural plaques probable prior asbestos exposure are stable from prior exam. No new pleural thickening or plaques. There is no bronchiectasis. No pneumothorax. Upper  Abdomen: The visualized upper abdomen shows mild distended gallbladder without evidence of calcified gallstones. Visualized spleen is unremarkable. Visualized liver shows no definite focal mass. Musculoskeletal: No destructive bony lesions are noted. Sagittal images of the spine shows mild diffuse osteopenia. Minimal degenerative changes mid thoracic spine. No acute rib fractures are identified. There is no pneumothorax. Review of the MIP images confirms the above findings. IMPRESSION: 1. No pulmonary embolus is noted. Atherosclerotic calcifications of thoracic aorta and coronary arteries. 2. Stable postsurgical changes post right upper lobectomy. No definite evidence of recurrent mass. Mild emphysematous changes again noted. Stable bilateral calcified anterior pleural plaques. No infiltrate or pulmonary edema. 3. No mediastinal hematoma or adenopathy. A lower right pretracheal lymph node measures 9 mm short-axis not pathologic by size criteria please see image 36 from series 5. 4. Mild thoracic spine osteopenia. Minimal degenerative changes mid thoracic spine. No rib fractures are identified. Electronically Signed   By: Lahoma Crocker M.D.   On: 09/06/2016 14:41    Procedures Procedures (including critical care time)  Medications Ordered in ED Medications  iopamidol (ISOVUE-370) 76 % injection (not administered)  iopamidol (ISOVUE-370) 76 % injection 100 mL (not administered)  morphine 4 MG/ML injection 4 mg (4 mg Intravenous Given 09/06/16 1236)  ondansetron (ZOFRAN) injection 4 mg (4 mg Intravenous Given 09/06/16 1235)  sodium chloride 0.9 % bolus 1,000 mL (1,000 mLs Intravenous New Bag/Given 09/06/16 1235)     Initial Impression / Assessment and Plan / ED Course  I have reviewed the triage vital signs and the nursing notes.  Pertinent labs & imaging results that were available during my care of the patient were reviewed by me and considered in my medical decision making (see chart for details).      Patient is a 71 year old male presenting with right-sided chest pain. He initially thought it was related to reaching to put something in a doorway when he felt a pop. Initially the pain was not that bad but has progressively worsened over the weekend. He is also having nausea vomiting related to the pain and unable to hold anything down. He has some mild shortness of breath but no significant shortness of breath. No new cough or sputum production. Patient does have a significant history of lung cancer status post resection on the right side. Pain is reproducible with palpation.  Mild decreased lung sounds in the right upper lobe. Otherwise no abdominal pain and appears to be neurovascularly intact. Labs with mild hemoconcentration and x-ray within normal limits. Given patient's extensive medical history low suspicion for cardiac cause however concern for possible pneumothorax that is occult versus PE versus undetected. Fracture. Will  do a CT PE study for further evaluation. Lower suspicion for dissection at this time. Patient improved after 4 mg of morphine. Oxygen saturation is about 90%. However it denies any shortness of breath at this time.  3:13 PM Patient is feeling better after meds. CT negative for acute pathology except for small lymph node. This was discussed with the patient and he will follow-up with PCP. He was given muscle relaxer and will take Advil at home. Final Clinical Impressions(s) / ED Diagnoses   Final diagnoses:  Chest wall pain    New Prescriptions New Prescriptions   CYCLOBENZAPRINE (FLEXERIL) 10 MG TABLET    Take 1 tablet (10 mg total) by mouth 2 (two) times daily as needed for muscle spasms.   DICLOFENAC SODIUM (VOLTAREN) 1 % GEL    Apply 2 g topically 4 (four) times daily.   ONDANSETRON (ZOFRAN) 4 MG TABLET    Take 1 tablet (4 mg total) by mouth every 6 (six) hours.     Blanchie Dessert, MD 09/06/16 [phone]

## 2016-09-06 NOTE — ED Triage Notes (Signed)
Patient states he hurt his right side ribs by pulling up on a door.  Pain radiates to your back and neck. Has N/V.  He hasn't been able to eat or sleep for 3 days due to the pain.  Patient states he heard something pop when pulling up.

## 2016-09-06 NOTE — Discharge Instructions (Signed)
There was a small lymph node on your CAT scan which at this time is not concerning however it would be important to tell your doctor about this to make sure they do not live do any further imaging in the future.

## 2016-10-12 DIAGNOSIS — H2513 Age-related nuclear cataract, bilateral: Secondary | ICD-10-CM | POA: Diagnosis not present

## 2016-10-12 DIAGNOSIS — H40033 Anatomical narrow angle, bilateral: Secondary | ICD-10-CM | POA: Diagnosis not present

## 2016-10-13 DIAGNOSIS — I10 Essential (primary) hypertension: Secondary | ICD-10-CM | POA: Diagnosis not present

## 2016-10-13 DIAGNOSIS — F411 Generalized anxiety disorder: Secondary | ICD-10-CM | POA: Diagnosis not present

## 2016-10-13 DIAGNOSIS — J410 Simple chronic bronchitis: Secondary | ICD-10-CM | POA: Diagnosis not present

## 2016-10-13 DIAGNOSIS — Z0000 Encounter for general adult medical examination without abnormal findings: Secondary | ICD-10-CM | POA: Diagnosis not present

## 2016-10-13 DIAGNOSIS — M792 Neuralgia and neuritis, unspecified: Secondary | ICD-10-CM | POA: Diagnosis not present

## 2016-10-13 DIAGNOSIS — C801 Malignant (primary) neoplasm, unspecified: Secondary | ICD-10-CM | POA: Diagnosis not present

## 2016-10-13 DIAGNOSIS — Z720 Tobacco use: Secondary | ICD-10-CM | POA: Diagnosis not present

## 2016-10-21 DIAGNOSIS — Z5181 Encounter for therapeutic drug level monitoring: Secondary | ICD-10-CM | POA: Diagnosis not present

## 2016-10-21 DIAGNOSIS — Z79899 Other long term (current) drug therapy: Secondary | ICD-10-CM | POA: Diagnosis not present

## 2016-10-21 DIAGNOSIS — R0789 Other chest pain: Secondary | ICD-10-CM | POA: Diagnosis not present

## 2016-10-21 DIAGNOSIS — G588 Other specified mononeuropathies: Secondary | ICD-10-CM | POA: Diagnosis not present

## 2016-10-21 DIAGNOSIS — M792 Neuralgia and neuritis, unspecified: Secondary | ICD-10-CM | POA: Diagnosis not present

## 2016-10-28 DIAGNOSIS — H25811 Combined forms of age-related cataract, right eye: Secondary | ICD-10-CM | POA: Diagnosis not present

## 2016-10-28 DIAGNOSIS — H25812 Combined forms of age-related cataract, left eye: Secondary | ICD-10-CM | POA: Diagnosis not present

## 2016-10-28 DIAGNOSIS — H25813 Combined forms of age-related cataract, bilateral: Secondary | ICD-10-CM | POA: Diagnosis not present

## 2016-11-03 DIAGNOSIS — H25812 Combined forms of age-related cataract, left eye: Secondary | ICD-10-CM | POA: Diagnosis not present

## 2016-11-03 DIAGNOSIS — H2513 Age-related nuclear cataract, bilateral: Secondary | ICD-10-CM | POA: Diagnosis not present

## 2016-11-03 DIAGNOSIS — Z961 Presence of intraocular lens: Secondary | ICD-10-CM | POA: Diagnosis not present

## 2016-11-03 DIAGNOSIS — H2512 Age-related nuclear cataract, left eye: Secondary | ICD-10-CM | POA: Diagnosis not present

## 2016-11-11 DIAGNOSIS — G8922 Chronic post-thoracotomy pain: Secondary | ICD-10-CM | POA: Diagnosis not present

## 2016-11-11 DIAGNOSIS — R0789 Other chest pain: Secondary | ICD-10-CM | POA: Diagnosis not present

## 2016-11-30 DIAGNOSIS — H2511 Age-related nuclear cataract, right eye: Secondary | ICD-10-CM | POA: Diagnosis not present

## 2016-11-30 DIAGNOSIS — Z961 Presence of intraocular lens: Secondary | ICD-10-CM | POA: Diagnosis not present

## 2016-11-30 DIAGNOSIS — H25811 Combined forms of age-related cataract, right eye: Secondary | ICD-10-CM | POA: Diagnosis not present

## 2016-11-30 DIAGNOSIS — H2513 Age-related nuclear cataract, bilateral: Secondary | ICD-10-CM | POA: Diagnosis not present

## 2016-12-09 DIAGNOSIS — R0789 Other chest pain: Secondary | ICD-10-CM | POA: Diagnosis not present

## 2016-12-09 DIAGNOSIS — G894 Chronic pain syndrome: Secondary | ICD-10-CM | POA: Diagnosis not present

## 2016-12-09 DIAGNOSIS — M792 Neuralgia and neuritis, unspecified: Secondary | ICD-10-CM | POA: Diagnosis not present

## 2016-12-20 DIAGNOSIS — H578 Other specified disorders of eye and adnexa: Secondary | ICD-10-CM | POA: Diagnosis not present

## 2017-01-04 DIAGNOSIS — M792 Neuralgia and neuritis, unspecified: Secondary | ICD-10-CM | POA: Diagnosis not present

## 2017-01-04 DIAGNOSIS — G894 Chronic pain syndrome: Secondary | ICD-10-CM | POA: Diagnosis not present

## 2017-01-04 DIAGNOSIS — R0789 Other chest pain: Secondary | ICD-10-CM | POA: Diagnosis not present

## 2017-01-04 DIAGNOSIS — H43393 Other vitreous opacities, bilateral: Secondary | ICD-10-CM | POA: Diagnosis not present

## 2017-01-04 DIAGNOSIS — R1011 Right upper quadrant pain: Secondary | ICD-10-CM | POA: Diagnosis not present

## 2017-02-20 DIAGNOSIS — J40 Bronchitis, not specified as acute or chronic: Secondary | ICD-10-CM | POA: Diagnosis not present

## 2017-02-20 DIAGNOSIS — R05 Cough: Secondary | ICD-10-CM | POA: Diagnosis not present

## 2017-02-20 DIAGNOSIS — J410 Simple chronic bronchitis: Secondary | ICD-10-CM | POA: Diagnosis not present

## 2017-03-06 ENCOUNTER — Other Ambulatory Visit: Payer: Self-pay | Admitting: Physician Assistant

## 2017-03-06 DIAGNOSIS — M792 Neuralgia and neuritis, unspecified: Secondary | ICD-10-CM | POA: Diagnosis not present

## 2017-03-06 DIAGNOSIS — J410 Simple chronic bronchitis: Secondary | ICD-10-CM

## 2017-03-06 DIAGNOSIS — J209 Acute bronchitis, unspecified: Secondary | ICD-10-CM

## 2017-03-06 DIAGNOSIS — F411 Generalized anxiety disorder: Secondary | ICD-10-CM | POA: Diagnosis not present

## 2017-03-06 DIAGNOSIS — Z125 Encounter for screening for malignant neoplasm of prostate: Secondary | ICD-10-CM | POA: Diagnosis not present

## 2017-03-06 DIAGNOSIS — Z0000 Encounter for general adult medical examination without abnormal findings: Secondary | ICD-10-CM | POA: Diagnosis not present

## 2017-03-06 DIAGNOSIS — C801 Malignant (primary) neoplasm, unspecified: Secondary | ICD-10-CM | POA: Diagnosis not present

## 2017-03-06 DIAGNOSIS — I10 Essential (primary) hypertension: Secondary | ICD-10-CM | POA: Diagnosis not present

## 2017-03-06 DIAGNOSIS — R531 Weakness: Secondary | ICD-10-CM | POA: Diagnosis not present

## 2017-03-07 DIAGNOSIS — F411 Generalized anxiety disorder: Secondary | ICD-10-CM | POA: Diagnosis not present

## 2017-03-07 DIAGNOSIS — Z125 Encounter for screening for malignant neoplasm of prostate: Secondary | ICD-10-CM | POA: Diagnosis not present

## 2017-03-07 DIAGNOSIS — J410 Simple chronic bronchitis: Secondary | ICD-10-CM | POA: Diagnosis not present

## 2017-03-07 DIAGNOSIS — Z0000 Encounter for general adult medical examination without abnormal findings: Secondary | ICD-10-CM | POA: Diagnosis not present

## 2017-03-07 DIAGNOSIS — I10 Essential (primary) hypertension: Secondary | ICD-10-CM | POA: Diagnosis not present

## 2017-03-07 DIAGNOSIS — C801 Malignant (primary) neoplasm, unspecified: Secondary | ICD-10-CM | POA: Diagnosis not present

## 2017-03-07 DIAGNOSIS — M792 Neuralgia and neuritis, unspecified: Secondary | ICD-10-CM | POA: Diagnosis not present

## 2017-03-14 ENCOUNTER — Ambulatory Visit
Admission: RE | Admit: 2017-03-14 | Discharge: 2017-03-14 | Disposition: A | Discharge status: Home / Self Care | Payer: Medicare HMO | Source: Ambulatory Visit | Attending: Physician Assistant | Admitting: Physician Assistant

## 2017-03-14 DIAGNOSIS — I77810 Thoracic aortic ectasia: Secondary | ICD-10-CM | POA: Diagnosis not present

## 2017-03-14 DIAGNOSIS — C801 Malignant (primary) neoplasm, unspecified: Secondary | ICD-10-CM | POA: Diagnosis not present

## 2017-03-14 DIAGNOSIS — J209 Acute bronchitis, unspecified: Secondary | ICD-10-CM

## 2017-03-14 DIAGNOSIS — I700 Atherosclerosis of aorta: Secondary | ICD-10-CM | POA: Insufficient documentation

## 2017-03-14 DIAGNOSIS — J479 Bronchiectasis, uncomplicated: Secondary | ICD-10-CM | POA: Insufficient documentation

## 2017-03-14 DIAGNOSIS — C3490 Malignant neoplasm of unspecified part of unspecified bronchus or lung: Secondary | ICD-10-CM | POA: Diagnosis not present

## 2017-03-14 DIAGNOSIS — J410 Simple chronic bronchitis: Secondary | ICD-10-CM | POA: Diagnosis not present

## 2017-03-14 DIAGNOSIS — I2510 Atherosclerotic heart disease of native coronary artery without angina pectoris: Secondary | ICD-10-CM | POA: Insufficient documentation

## 2017-03-14 MED ORDER — IOPAMIDOL (ISOVUE-300) INJECTION 61%
75.0000 mL | Freq: Once | INTRAVENOUS | Status: AC | PRN
Start: 2017-03-14 — End: 2017-03-14
  Administered 2017-03-14: 75 mL via INTRAVENOUS

## 2017-04-13 DIAGNOSIS — J470 Bronchiectasis with acute lower respiratory infection: Secondary | ICD-10-CM | POA: Diagnosis not present

## 2017-04-13 DIAGNOSIS — J410 Simple chronic bronchitis: Secondary | ICD-10-CM | POA: Diagnosis not present

## 2017-04-13 DIAGNOSIS — I10 Essential (primary) hypertension: Secondary | ICD-10-CM | POA: Diagnosis not present

## 2017-04-13 DIAGNOSIS — Z720 Tobacco use: Secondary | ICD-10-CM | POA: Diagnosis not present

## 2017-04-13 DIAGNOSIS — F411 Generalized anxiety disorder: Secondary | ICD-10-CM | POA: Diagnosis not present

## 2017-04-13 DIAGNOSIS — Z23 Encounter for immunization: Secondary | ICD-10-CM | POA: Diagnosis not present

## 2017-06-07 DIAGNOSIS — G894 Chronic pain syndrome: Secondary | ICD-10-CM | POA: Diagnosis not present

## 2017-06-07 DIAGNOSIS — R1011 Right upper quadrant pain: Secondary | ICD-10-CM | POA: Diagnosis not present

## 2017-06-07 DIAGNOSIS — M792 Neuralgia and neuritis, unspecified: Secondary | ICD-10-CM | POA: Diagnosis not present

## 2017-06-07 DIAGNOSIS — R0789 Other chest pain: Secondary | ICD-10-CM | POA: Diagnosis not present

## 2017-06-21 DIAGNOSIS — R0789 Other chest pain: Secondary | ICD-10-CM | POA: Diagnosis not present

## 2017-06-21 DIAGNOSIS — M792 Neuralgia and neuritis, unspecified: Secondary | ICD-10-CM | POA: Diagnosis not present

## 2017-06-21 DIAGNOSIS — G894 Chronic pain syndrome: Secondary | ICD-10-CM | POA: Diagnosis not present

## 2017-06-21 DIAGNOSIS — R1011 Right upper quadrant pain: Secondary | ICD-10-CM | POA: Diagnosis not present

## 2017-07-14 DIAGNOSIS — R1011 Right upper quadrant pain: Secondary | ICD-10-CM | POA: Diagnosis not present

## 2017-07-14 DIAGNOSIS — M792 Neuralgia and neuritis, unspecified: Secondary | ICD-10-CM | POA: Diagnosis not present

## 2017-07-14 DIAGNOSIS — G8912 Acute post-thoracotomy pain: Secondary | ICD-10-CM | POA: Diagnosis not present

## 2017-07-14 DIAGNOSIS — G894 Chronic pain syndrome: Secondary | ICD-10-CM | POA: Diagnosis not present

## 2017-08-09 DIAGNOSIS — G893 Neoplasm related pain (acute) (chronic): Secondary | ICD-10-CM | POA: Diagnosis not present

## 2017-08-09 DIAGNOSIS — M792 Neuralgia and neuritis, unspecified: Secondary | ICD-10-CM | POA: Diagnosis not present

## 2017-08-09 DIAGNOSIS — R1011 Right upper quadrant pain: Secondary | ICD-10-CM | POA: Diagnosis not present

## 2017-08-09 DIAGNOSIS — G8912 Acute post-thoracotomy pain: Secondary | ICD-10-CM | POA: Diagnosis not present

## 2017-08-14 DIAGNOSIS — Z85118 Personal history of other malignant neoplasm of bronchus and lung: Secondary | ICD-10-CM | POA: Diagnosis not present

## 2017-08-14 DIAGNOSIS — G893 Neoplasm related pain (acute) (chronic): Secondary | ICD-10-CM | POA: Insufficient documentation

## 2017-08-14 DIAGNOSIS — R1011 Right upper quadrant pain: Secondary | ICD-10-CM | POA: Diagnosis not present

## 2017-08-14 DIAGNOSIS — N400 Enlarged prostate without lower urinary tract symptoms: Secondary | ICD-10-CM | POA: Diagnosis not present

## 2017-08-14 DIAGNOSIS — R918 Other nonspecific abnormal finding of lung field: Secondary | ICD-10-CM | POA: Diagnosis not present

## 2017-08-15 DIAGNOSIS — C801 Malignant (primary) neoplasm, unspecified: Secondary | ICD-10-CM | POA: Diagnosis not present

## 2017-08-15 DIAGNOSIS — M792 Neuralgia and neuritis, unspecified: Secondary | ICD-10-CM | POA: Diagnosis not present

## 2017-08-15 DIAGNOSIS — J209 Acute bronchitis, unspecified: Secondary | ICD-10-CM | POA: Diagnosis not present

## 2017-08-15 DIAGNOSIS — J410 Simple chronic bronchitis: Secondary | ICD-10-CM | POA: Diagnosis not present

## 2017-08-15 DIAGNOSIS — Z720 Tobacco use: Secondary | ICD-10-CM | POA: Diagnosis not present

## 2017-08-15 DIAGNOSIS — I10 Essential (primary) hypertension: Secondary | ICD-10-CM | POA: Diagnosis not present

## 2017-08-15 DIAGNOSIS — H9191 Unspecified hearing loss, right ear: Secondary | ICD-10-CM | POA: Diagnosis not present

## 2017-08-16 DIAGNOSIS — R0789 Other chest pain: Secondary | ICD-10-CM | POA: Diagnosis not present

## 2017-08-16 DIAGNOSIS — G580 Intercostal neuropathy: Secondary | ICD-10-CM | POA: Diagnosis not present

## 2017-08-16 DIAGNOSIS — G588 Other specified mononeuropathies: Secondary | ICD-10-CM | POA: Diagnosis not present

## 2017-08-16 DIAGNOSIS — F17200 Nicotine dependence, unspecified, uncomplicated: Secondary | ICD-10-CM | POA: Diagnosis not present

## 2017-08-16 DIAGNOSIS — G893 Neoplasm related pain (acute) (chronic): Secondary | ICD-10-CM | POA: Diagnosis not present

## 2017-08-16 DIAGNOSIS — Z885 Allergy status to narcotic agent status: Secondary | ICD-10-CM | POA: Diagnosis not present

## 2017-08-16 DIAGNOSIS — C3491 Malignant neoplasm of unspecified part of right bronchus or lung: Secondary | ICD-10-CM | POA: Diagnosis not present

## 2017-09-07 DIAGNOSIS — R05 Cough: Secondary | ICD-10-CM | POA: Diagnosis not present

## 2017-09-07 DIAGNOSIS — I712 Thoracic aortic aneurysm, without rupture: Secondary | ICD-10-CM | POA: Diagnosis not present

## 2017-09-07 DIAGNOSIS — R0981 Nasal congestion: Secondary | ICD-10-CM | POA: Diagnosis not present

## 2017-09-07 DIAGNOSIS — R0602 Shortness of breath: Secondary | ICD-10-CM | POA: Diagnosis not present

## 2017-09-07 DIAGNOSIS — J310 Chronic rhinitis: Secondary | ICD-10-CM | POA: Diagnosis not present

## 2017-09-07 DIAGNOSIS — J439 Emphysema, unspecified: Secondary | ICD-10-CM | POA: Diagnosis not present

## 2017-09-08 DIAGNOSIS — J310 Chronic rhinitis: Secondary | ICD-10-CM | POA: Diagnosis not present

## 2017-09-08 DIAGNOSIS — R0602 Shortness of breath: Secondary | ICD-10-CM | POA: Diagnosis not present

## 2017-09-08 DIAGNOSIS — R0981 Nasal congestion: Secondary | ICD-10-CM | POA: Diagnosis not present

## 2017-09-08 DIAGNOSIS — R05 Cough: Secondary | ICD-10-CM | POA: Diagnosis not present

## 2017-09-08 DIAGNOSIS — J439 Emphysema, unspecified: Secondary | ICD-10-CM | POA: Diagnosis not present

## 2017-09-14 DIAGNOSIS — R05 Cough: Secondary | ICD-10-CM | POA: Diagnosis not present

## 2017-09-14 DIAGNOSIS — R0602 Shortness of breath: Secondary | ICD-10-CM | POA: Diagnosis not present

## 2017-09-21 ENCOUNTER — Ambulatory Visit (INDEPENDENT_AMBULATORY_CARE_PROVIDER_SITE_OTHER): Payer: Medicare HMO | Admitting: Vascular Surgery

## 2017-09-21 ENCOUNTER — Encounter (INDEPENDENT_AMBULATORY_CARE_PROVIDER_SITE_OTHER): Payer: Self-pay | Admitting: Vascular Surgery

## 2017-09-21 VITALS — BP 134/84 | HR 75 | Resp 16 | Ht 67.0 in | Wt 149.4 lb

## 2017-09-21 DIAGNOSIS — J449 Chronic obstructive pulmonary disease, unspecified: Secondary | ICD-10-CM | POA: Diagnosis not present

## 2017-09-21 DIAGNOSIS — I712 Thoracic aortic aneurysm, without rupture, unspecified: Secondary | ICD-10-CM

## 2017-09-21 DIAGNOSIS — I10 Essential (primary) hypertension: Secondary | ICD-10-CM

## 2017-09-21 DIAGNOSIS — F411 Generalized anxiety disorder: Secondary | ICD-10-CM

## 2017-09-23 ENCOUNTER — Encounter (INDEPENDENT_AMBULATORY_CARE_PROVIDER_SITE_OTHER): Payer: Self-pay | Admitting: Vascular Surgery

## 2017-09-23 DIAGNOSIS — I712 Thoracic aortic aneurysm, without rupture, unspecified: Secondary | ICD-10-CM | POA: Insufficient documentation

## 2017-09-23 NOTE — Progress Notes (Signed)
MRN : [id number]  Stephen Leon is a 72 y.o. (26-Aug-1945) male who presents with chief complaint of  Chief Complaint  Patient presents with  . New Patient (Initial Visit)    ref Raul Del for Thoracic Aneurysm  .  History of Present Illness: The patient presents to the office for evaluation of an thoracic aortic aneurysm. The aneurysm was found incidentally by CT scan. Patient denies chest abdominal pain or unusual back pain, no other abdominal complaints.  No history of an acute onset of painful blue discoloration of the toes.     No family history of TAA/AAA.   Patient denies amaurosis fugax or TIA symptoms. There is no history of claudication or rest pain symptoms of the lower extremities.  The patient denies angina or shortness of breath.  CT scan shows an TAA that measures 4.10 cm  Current Meds  Medication Sig  . albuterol (PROVENTIL HFA;VENTOLIN HFA) 108 (90 Base) MCG/ACT inhaler Inhale 2 puffs into the lungs every 6 (six) hours as needed for wheezing or shortness of breath.  . clarithromycin (BIAXIN) 500 MG tablet   . fluticasone furoate-vilanterol (BREO ELLIPTA) 100-25 MCG/INH AEPB Inhale 1 puff into the lungs daily.   Marland Kitchen gabapentin (NEURONTIN) 400 MG capsule   . ibuprofen (ADVIL,MOTRIN) 200 MG tablet Take 400 mg by mouth 2 (two) times daily.    Past Medical History:  Diagnosis Date  . Cancer ([place])   . Cough 07/13/2007   Qualifier: Diagnosis of  By: Wynona Luna   . Hypertension   . LUNG NODULE 07/13/2007   Qualifier: Diagnosis of  By: Wynona Luna     Past Surgical History:  Procedure Laterality Date  . HERNIA REPAIR      Social History Social History   Tobacco Use  . Smoking status: Current Some Day Smoker    Packs/day: 0.25    Types: Cigarettes  . Smokeless tobacco: Never Used  Substance Use Topics  . Alcohol use: Yes    Alcohol/week: 7.2 oz    Types: 12 Standard drinks or equivalent per week    Comment: Drinks 3-4 beers every night    . Drug use: No    Family History Family History  Problem Relation Age of Onset  . COPD Mother   No family history of bleeding/clotting disorders, porphyria or autoimmune disease   Allergies  Allergen Reactions  . Codeine Nausea And Vomiting     REVIEW OF SYSTEMS (Negative unless checked)  Constitutional: [] Weight loss  [] Fever  [] Chills Cardiac: [] Chest pain   [] Chest pressure   [] Palpitations   [] Shortness of breath when laying flat   [x] Shortness of breath with exertion. Vascular:  [] Pain in legs with walking   [] Pain in legs at rest  [] History of DVT   [] Phlebitis   [] Swelling in legs   [] Varicose veins   [] Non-healing ulcers Pulmonary:   [] Uses home oxygen   [] Productive cough   [] Hemoptysis   [] Wheeze  [x] COPD   [] Asthma Neurologic:  [] Dizziness   [] Seizures   [] History of stroke   [] History of TIA  [] Aphasia   [] Vissual changes   [] Weakness or numbness in arm   [] Weakness or numbness in leg Musculoskeletal:   [] Joint swelling   [] Joint pain   [] Low back pain Hematologic:  [] Easy bruising  [] Easy bleeding   [] Hypercoagulable state   [] Anemic Gastrointestinal:  [] Diarrhea   [] Vomiting  [] Gastroesophageal reflux/heartburn   [] Difficulty swallowing. Genitourinary:  [] Chronic kidney disease   [] Difficult  urination  [] Frequent urination   [] Blood in urine Skin:  [] Rashes   [] Ulcers  Psychological:  [] History of anxiety   []  History of major depression.  Physical Examination  Vitals:   09/21/17 1038  BP: 134/84  Pulse: 75  Resp: 16  Weight: 149 lb 6.4 oz (67.8 kg)  Height: 5\' 7"  (1.702 m)   Body mass index is 23.4 kg/m. Gen: WD/WN, NAD Head: [place]/AT, No temporalis wasting.  Ear/Nose/Throat: Hearing grossly intact, nares w/o erythema or drainage, poor dentition Eyes: PER, EOMI, sclera nonicteric.  Neck: Supple, no masses.  No bruit or JVD.  Pulmonary:  Good air movement, clear to auscultation bilaterally, no use of accessory muscles.  Cardiac: RRR, normal S1, S2, no  Murmurs. Vascular:  Popliteal arteries not enlarged Vessel Right Left  Radial Palpable Palpable  Ulnar Palpable Palpable  Brachial Palpable Palpable  Carotid Palpable Palpable  Femoral Palpable Palpable  Popliteal Palpable Palpable  PT Palpable Palpable  DP Palpable Palpable   Gastrointestinal: soft, non-distended. No guarding/no peritoneal signs.  Musculoskeletal: M/S 5/5 throughout.  No deformity or atrophy.  Neurologic: CN 2-12 intact. Pain and light touch intact in extremities.  Symmetrical.  Speech is fluent. Motor exam as listed above. Psychiatric: Judgment intact, Mood & affect appropriate for pt's clinical situation. Dermatologic: No rashes or ulcers noted.  No changes consistent with cellulitis. Lymph : No Cervical lymphadenopathy, no lichenification or skin changes of chronic lymphedema.  CBC Lab Results  Component Value Date   WBC 8.6 09/06/2016   HGB 17.3 (H) 09/06/2016   HCT 51.0 09/06/2016   MCV 97.2 09/06/2016   PLT 213 09/06/2016    BMET    Component Value Date/Time   NA 136 09/06/2016 1252   K 4.2 09/06/2016 1252   CL 99 (L) 09/06/2016 1252   CO2 30 07/06/2007 0736   GLUCOSE 92 09/06/2016 1252   BUN 15 09/06/2016 1252   CREATININE 0.80 09/06/2016 1252   CALCIUM 9.2 07/06/2007 0736   GFRNONAA 105 07/06/2007 0736   GFRAA 127 07/06/2007 0736   CrCl cannot be calculated (Patient's most recent lab result is older than the maximum 21 days allowed.).  COAG No results found for: INR, PROTIME  Radiology No results found.   Assessment/Plan 1. Thoracic aortic aneurysm without rupture ([place]) No surgery or intervention at this time. The patient has an asymptomatic thoracic aortic aneurysm that is  4.1 cm in maximal diameter.  I have discussed the natural history of  aortic aneurysm and the small risk of rupture for aneurysm less than 5 cm in size.  However, as these small aneurysms tend to enlarge over time, continued surveillance with  CT scan is mandatory.    I have also discussed optimizing medical management with hypertension and lipid control and the importance of abstinence from tobacco.  The patient is also encouraged to exercise a minimum of 30 minutes 4 times a week.  Should the patient develop new onset abdominal or back pain or signs of peripheral embolization they are instructed to seek medical attention immediately and to alert the physician providing care that they have an aneurysm.  The patient voices their understanding. The patient will return in 12 months for review.  Given his pulmonary disease he is likely to have several CT scans a year and I will use these to follow his thoracic aneurysm.  I have explained this to him in detail.  Should he return next year and not have a recent CT scan and one will  be ordered and he understands that.  This is an attempt to avoid excessive x-ray exposure.  A CT scan of the abdomen and pelvis dated 08/24/2015 just 2 years ago demonstrated a normal abdominal aorta no evidence of aortic or iliac aneurysmal deterioration.  2. Essential (primary) hypertension Continue antihypertensive medications as already ordered, these medications have been reviewed and there are no changes at this time.   3. Chronic obstructive pulmonary disease, unspecified COPD type ([place]) Continue pulmonary medications and aerosols as already ordered, these medications have been reviewed and there are no changes at this time.    4. GAD (generalized anxiety disorder) Continue antianxiety medications as already ordered, these medications have been reviewed and there are no changes at this time.     Hortencia Pilar, MD  09/23/2017 12:02 PM

## 2017-10-04 DIAGNOSIS — M792 Neuralgia and neuritis, unspecified: Secondary | ICD-10-CM | POA: Diagnosis not present

## 2017-10-04 DIAGNOSIS — G8912 Acute post-thoracotomy pain: Secondary | ICD-10-CM | POA: Diagnosis not present

## 2017-10-04 DIAGNOSIS — G893 Neoplasm related pain (acute) (chronic): Secondary | ICD-10-CM | POA: Diagnosis not present

## 2017-10-11 DIAGNOSIS — J479 Bronchiectasis, uncomplicated: Secondary | ICD-10-CM | POA: Diagnosis not present

## 2017-10-11 DIAGNOSIS — J61 Pneumoconiosis due to asbestos and other mineral fibers: Secondary | ICD-10-CM | POA: Diagnosis not present

## 2017-10-11 DIAGNOSIS — G8912 Acute post-thoracotomy pain: Secondary | ICD-10-CM | POA: Insufficient documentation

## 2017-10-11 DIAGNOSIS — J439 Emphysema, unspecified: Secondary | ICD-10-CM | POA: Diagnosis not present

## 2017-10-11 DIAGNOSIS — R05 Cough: Secondary | ICD-10-CM | POA: Diagnosis not present

## 2017-10-25 DIAGNOSIS — G8912 Acute post-thoracotomy pain: Secondary | ICD-10-CM | POA: Diagnosis not present

## 2017-10-25 DIAGNOSIS — J449 Chronic obstructive pulmonary disease, unspecified: Secondary | ICD-10-CM | POA: Diagnosis not present

## 2017-10-25 DIAGNOSIS — G588 Other specified mononeuropathies: Secondary | ICD-10-CM | POA: Diagnosis not present

## 2017-10-25 DIAGNOSIS — M792 Neuralgia and neuritis, unspecified: Secondary | ICD-10-CM | POA: Diagnosis not present

## 2017-10-25 DIAGNOSIS — G580 Intercostal neuropathy: Secondary | ICD-10-CM | POA: Diagnosis not present

## 2017-10-25 DIAGNOSIS — Z4549 Encounter for adjustment and management of other implanted nervous system device: Secondary | ICD-10-CM | POA: Diagnosis not present

## 2017-10-25 DIAGNOSIS — Z85118 Personal history of other malignant neoplasm of bronchus and lung: Secondary | ICD-10-CM | POA: Diagnosis not present

## 2017-10-25 DIAGNOSIS — F17210 Nicotine dependence, cigarettes, uncomplicated: Secondary | ICD-10-CM | POA: Diagnosis not present

## 2017-12-06 DIAGNOSIS — Z720 Tobacco use: Secondary | ICD-10-CM | POA: Diagnosis not present

## 2017-12-06 DIAGNOSIS — Z125 Encounter for screening for malignant neoplasm of prostate: Secondary | ICD-10-CM | POA: Diagnosis not present

## 2017-12-06 DIAGNOSIS — J209 Acute bronchitis, unspecified: Secondary | ICD-10-CM | POA: Diagnosis not present

## 2017-12-06 DIAGNOSIS — M792 Neuralgia and neuritis, unspecified: Secondary | ICD-10-CM | POA: Diagnosis not present

## 2017-12-06 DIAGNOSIS — C801 Malignant (primary) neoplasm, unspecified: Secondary | ICD-10-CM | POA: Diagnosis not present

## 2017-12-06 DIAGNOSIS — H9191 Unspecified hearing loss, right ear: Secondary | ICD-10-CM | POA: Diagnosis not present

## 2017-12-06 DIAGNOSIS — I10 Essential (primary) hypertension: Secondary | ICD-10-CM | POA: Diagnosis not present

## 2017-12-06 DIAGNOSIS — J410 Simple chronic bronchitis: Secondary | ICD-10-CM | POA: Diagnosis not present

## 2017-12-07 DIAGNOSIS — M792 Neuralgia and neuritis, unspecified: Secondary | ICD-10-CM | POA: Diagnosis not present

## 2017-12-07 DIAGNOSIS — J209 Acute bronchitis, unspecified: Secondary | ICD-10-CM | POA: Diagnosis not present

## 2017-12-07 DIAGNOSIS — I10 Essential (primary) hypertension: Secondary | ICD-10-CM | POA: Diagnosis not present

## 2017-12-07 DIAGNOSIS — J410 Simple chronic bronchitis: Secondary | ICD-10-CM | POA: Diagnosis not present

## 2017-12-07 DIAGNOSIS — Z720 Tobacco use: Secondary | ICD-10-CM | POA: Diagnosis not present

## 2017-12-07 DIAGNOSIS — H9191 Unspecified hearing loss, right ear: Secondary | ICD-10-CM | POA: Diagnosis not present

## 2017-12-07 DIAGNOSIS — C801 Malignant (primary) neoplasm, unspecified: Secondary | ICD-10-CM | POA: Diagnosis not present

## 2017-12-13 DIAGNOSIS — C801 Malignant (primary) neoplasm, unspecified: Secondary | ICD-10-CM | POA: Diagnosis not present

## 2017-12-13 DIAGNOSIS — Z0000 Encounter for general adult medical examination without abnormal findings: Secondary | ICD-10-CM | POA: Diagnosis not present

## 2017-12-13 DIAGNOSIS — J439 Emphysema, unspecified: Secondary | ICD-10-CM | POA: Diagnosis not present

## 2017-12-13 DIAGNOSIS — R21 Rash and other nonspecific skin eruption: Secondary | ICD-10-CM | POA: Diagnosis not present

## 2017-12-13 DIAGNOSIS — M792 Neuralgia and neuritis, unspecified: Secondary | ICD-10-CM | POA: Diagnosis not present

## 2017-12-13 DIAGNOSIS — I10 Essential (primary) hypertension: Secondary | ICD-10-CM | POA: Diagnosis not present

## 2017-12-13 DIAGNOSIS — Z720 Tobacco use: Secondary | ICD-10-CM | POA: Diagnosis not present

## 2017-12-20 DIAGNOSIS — G8912 Acute post-thoracotomy pain: Secondary | ICD-10-CM | POA: Diagnosis not present

## 2017-12-20 DIAGNOSIS — G893 Neoplasm related pain (acute) (chronic): Secondary | ICD-10-CM | POA: Diagnosis not present

## 2017-12-20 DIAGNOSIS — M792 Neuralgia and neuritis, unspecified: Secondary | ICD-10-CM | POA: Diagnosis not present

## 2018-01-17 DIAGNOSIS — J209 Acute bronchitis, unspecified: Secondary | ICD-10-CM | POA: Diagnosis not present

## 2018-01-17 DIAGNOSIS — J439 Emphysema, unspecified: Secondary | ICD-10-CM | POA: Diagnosis not present

## 2018-03-09 DIAGNOSIS — Z720 Tobacco use: Secondary | ICD-10-CM | POA: Diagnosis not present

## 2018-03-09 DIAGNOSIS — M792 Neuralgia and neuritis, unspecified: Secondary | ICD-10-CM | POA: Diagnosis not present

## 2018-03-09 DIAGNOSIS — C801 Malignant (primary) neoplasm, unspecified: Secondary | ICD-10-CM | POA: Diagnosis not present

## 2018-03-09 DIAGNOSIS — J439 Emphysema, unspecified: Secondary | ICD-10-CM | POA: Diagnosis not present

## 2018-03-09 DIAGNOSIS — Z0000 Encounter for general adult medical examination without abnormal findings: Secondary | ICD-10-CM | POA: Diagnosis not present

## 2018-03-09 DIAGNOSIS — I10 Essential (primary) hypertension: Secondary | ICD-10-CM | POA: Diagnosis not present

## 2018-03-09 DIAGNOSIS — J929 Pleural plaque without asbestos: Secondary | ICD-10-CM | POA: Diagnosis not present

## 2018-03-14 DIAGNOSIS — G588 Other specified mononeuropathies: Secondary | ICD-10-CM | POA: Diagnosis not present

## 2018-03-14 DIAGNOSIS — G8912 Acute post-thoracotomy pain: Secondary | ICD-10-CM | POA: Diagnosis not present

## 2018-03-14 DIAGNOSIS — G893 Neoplasm related pain (acute) (chronic): Secondary | ICD-10-CM | POA: Diagnosis not present

## 2018-03-14 DIAGNOSIS — G894 Chronic pain syndrome: Secondary | ICD-10-CM | POA: Diagnosis not present

## 2018-04-26 DIAGNOSIS — J209 Acute bronchitis, unspecified: Secondary | ICD-10-CM | POA: Diagnosis not present

## 2018-04-26 DIAGNOSIS — J61 Pneumoconiosis due to asbestos and other mineral fibers: Secondary | ICD-10-CM | POA: Diagnosis not present

## 2018-04-26 DIAGNOSIS — J310 Chronic rhinitis: Secondary | ICD-10-CM | POA: Diagnosis not present

## 2018-04-26 DIAGNOSIS — Z720 Tobacco use: Secondary | ICD-10-CM | POA: Diagnosis not present

## 2018-07-12 DIAGNOSIS — M792 Neuralgia and neuritis, unspecified: Secondary | ICD-10-CM | POA: Diagnosis not present

## 2018-07-12 DIAGNOSIS — I10 Essential (primary) hypertension: Secondary | ICD-10-CM | POA: Diagnosis not present

## 2018-07-12 DIAGNOSIS — J439 Emphysema, unspecified: Secondary | ICD-10-CM | POA: Diagnosis not present

## 2018-07-12 DIAGNOSIS — C801 Malignant (primary) neoplasm, unspecified: Secondary | ICD-10-CM | POA: Diagnosis not present

## 2018-07-12 DIAGNOSIS — Z720 Tobacco use: Secondary | ICD-10-CM | POA: Diagnosis not present

## 2018-07-12 DIAGNOSIS — Z0000 Encounter for general adult medical examination without abnormal findings: Secondary | ICD-10-CM | POA: Diagnosis not present

## 2018-07-12 DIAGNOSIS — J929 Pleural plaque without asbestos: Secondary | ICD-10-CM | POA: Diagnosis not present

## 2018-07-18 DIAGNOSIS — G893 Neoplasm related pain (acute) (chronic): Secondary | ICD-10-CM | POA: Diagnosis not present

## 2018-07-18 DIAGNOSIS — R1011 Right upper quadrant pain: Secondary | ICD-10-CM | POA: Diagnosis not present

## 2018-07-18 DIAGNOSIS — G8912 Acute post-thoracotomy pain: Secondary | ICD-10-CM | POA: Diagnosis not present

## 2018-07-18 DIAGNOSIS — G894 Chronic pain syndrome: Secondary | ICD-10-CM | POA: Diagnosis not present

## 2018-07-19 ENCOUNTER — Other Ambulatory Visit (HOSPITAL_COMMUNITY): Payer: Self-pay | Admitting: Internal Medicine

## 2018-07-19 DIAGNOSIS — I10 Essential (primary) hypertension: Secondary | ICD-10-CM | POA: Diagnosis not present

## 2018-07-19 DIAGNOSIS — G8912 Acute post-thoracotomy pain: Secondary | ICD-10-CM | POA: Diagnosis not present

## 2018-07-19 DIAGNOSIS — F411 Generalized anxiety disorder: Secondary | ICD-10-CM | POA: Diagnosis not present

## 2018-07-19 DIAGNOSIS — C801 Malignant (primary) neoplasm, unspecified: Secondary | ICD-10-CM | POA: Diagnosis not present

## 2018-07-19 DIAGNOSIS — R0602 Shortness of breath: Secondary | ICD-10-CM

## 2018-07-19 DIAGNOSIS — Z85118 Personal history of other malignant neoplasm of bronchus and lung: Secondary | ICD-10-CM | POA: Diagnosis not present

## 2018-07-19 DIAGNOSIS — J440 Chronic obstructive pulmonary disease with acute lower respiratory infection: Secondary | ICD-10-CM | POA: Diagnosis not present

## 2018-07-19 DIAGNOSIS — J929 Pleural plaque without asbestos: Secondary | ICD-10-CM | POA: Diagnosis not present

## 2018-07-19 DIAGNOSIS — Z720 Tobacco use: Secondary | ICD-10-CM | POA: Diagnosis not present

## 2018-07-20 ENCOUNTER — Other Ambulatory Visit: Payer: Self-pay | Admitting: Internal Medicine

## 2018-07-20 DIAGNOSIS — R0602 Shortness of breath: Secondary | ICD-10-CM

## 2018-07-24 ENCOUNTER — Ambulatory Visit
Admission: RE | Admit: 2018-07-24 | Discharge: 2018-07-24 | Disposition: A | Discharge status: Home / Self Care | Payer: Medicare HMO | Source: Ambulatory Visit | Attending: Internal Medicine | Admitting: Internal Medicine

## 2018-07-24 DIAGNOSIS — R0602 Shortness of breath: Secondary | ICD-10-CM | POA: Insufficient documentation

## 2018-07-24 DIAGNOSIS — J479 Bronchiectasis, uncomplicated: Secondary | ICD-10-CM | POA: Diagnosis not present

## 2018-07-24 MED ORDER — IOHEXOL 300 MG/ML  SOLN
75.0000 mL | Freq: Once | INTRAMUSCULAR | Status: AC | PRN
  Administered 2018-07-24: 75 mL via INTRAVENOUS

## 2018-08-22 DIAGNOSIS — G588 Other specified mononeuropathies: Secondary | ICD-10-CM | POA: Diagnosis not present

## 2018-08-22 DIAGNOSIS — G894 Chronic pain syndrome: Secondary | ICD-10-CM | POA: Diagnosis not present

## 2018-08-22 DIAGNOSIS — G893 Neoplasm related pain (acute) (chronic): Secondary | ICD-10-CM | POA: Diagnosis not present

## 2018-08-22 DIAGNOSIS — G8912 Acute post-thoracotomy pain: Secondary | ICD-10-CM | POA: Diagnosis not present

## 2018-08-28 DIAGNOSIS — R05 Cough: Secondary | ICD-10-CM | POA: Diagnosis not present

## 2018-08-28 DIAGNOSIS — Z8709 Personal history of other diseases of the respiratory system: Secondary | ICD-10-CM | POA: Diagnosis not present

## 2018-08-28 DIAGNOSIS — J439 Emphysema, unspecified: Secondary | ICD-10-CM | POA: Diagnosis not present

## 2018-08-28 DIAGNOSIS — J449 Chronic obstructive pulmonary disease, unspecified: Secondary | ICD-10-CM | POA: Diagnosis not present

## 2018-08-29 DIAGNOSIS — R0789 Other chest pain: Secondary | ICD-10-CM | POA: Diagnosis not present

## 2018-08-29 DIAGNOSIS — J449 Chronic obstructive pulmonary disease, unspecified: Secondary | ICD-10-CM | POA: Diagnosis not present

## 2018-08-29 DIAGNOSIS — C3491 Malignant neoplasm of unspecified part of right bronchus or lung: Secondary | ICD-10-CM | POA: Diagnosis not present

## 2018-08-29 DIAGNOSIS — F17200 Nicotine dependence, unspecified, uncomplicated: Secondary | ICD-10-CM | POA: Diagnosis not present

## 2018-08-29 DIAGNOSIS — Z885 Allergy status to narcotic agent status: Secondary | ICD-10-CM | POA: Diagnosis not present

## 2018-08-29 DIAGNOSIS — G580 Intercostal neuropathy: Secondary | ICD-10-CM | POA: Diagnosis not present

## 2018-09-11 DIAGNOSIS — I10 Essential (primary) hypertension: Secondary | ICD-10-CM | POA: Diagnosis not present

## 2018-09-11 DIAGNOSIS — F331 Major depressive disorder, recurrent, moderate: Secondary | ICD-10-CM | POA: Diagnosis not present

## 2018-09-11 DIAGNOSIS — F411 Generalized anxiety disorder: Secondary | ICD-10-CM | POA: Diagnosis not present

## 2018-09-11 DIAGNOSIS — Z720 Tobacco use: Secondary | ICD-10-CM | POA: Diagnosis not present

## 2018-09-11 DIAGNOSIS — I712 Thoracic aortic aneurysm, without rupture: Secondary | ICD-10-CM | POA: Diagnosis not present

## 2018-09-11 DIAGNOSIS — J439 Emphysema, unspecified: Secondary | ICD-10-CM | POA: Diagnosis not present

## 2018-09-11 DIAGNOSIS — C801 Malignant (primary) neoplasm, unspecified: Secondary | ICD-10-CM | POA: Diagnosis not present

## 2018-09-11 DIAGNOSIS — J479 Bronchiectasis, uncomplicated: Secondary | ICD-10-CM | POA: Diagnosis not present

## 2018-09-24 ENCOUNTER — Encounter (INDEPENDENT_AMBULATORY_CARE_PROVIDER_SITE_OTHER): Payer: Self-pay | Admitting: Vascular Surgery

## 2018-09-24 ENCOUNTER — Other Ambulatory Visit: Payer: Self-pay

## 2018-09-24 ENCOUNTER — Ambulatory Visit (INDEPENDENT_AMBULATORY_CARE_PROVIDER_SITE_OTHER): Payer: Medicare HMO | Admitting: Vascular Surgery

## 2018-09-24 VITALS — BP 149/80 | HR 73 | Resp 12 | Ht 67.0 in | Wt 154.0 lb

## 2018-09-24 DIAGNOSIS — J449 Chronic obstructive pulmonary disease, unspecified: Secondary | ICD-10-CM

## 2018-09-24 DIAGNOSIS — I712 Thoracic aortic aneurysm, without rupture, unspecified: Secondary | ICD-10-CM

## 2018-09-24 DIAGNOSIS — I10 Essential (primary) hypertension: Secondary | ICD-10-CM | POA: Diagnosis not present

## 2018-09-24 DIAGNOSIS — F17210 Nicotine dependence, cigarettes, uncomplicated: Secondary | ICD-10-CM

## 2018-09-24 NOTE — Progress Notes (Signed)
MRN : [id number]  Stephen Leon is a 73 y.o. ([birth year]) male who presents with chief complaint of  Chief Complaint  Patient presents with  . Follow-up  .  History of Present Illness:   The patient presents to the office for evaluation of an thoracic aortic aneurysm. The aneurysm was found incidentally by CT scan. Patient denies chest abdominal pain or unusual back pain, no other abdominal complaints.  No history of an acute onset of painful blue discoloration of the toes.     No family history of TAA/AAA.   Patient denies amaurosis fugax or TIA symptoms. There is no history of claudication or rest pain symptoms of the lower extremities.  The patient denies angina or shortness of breath.  CT scan shows an TAA that measures 4.20 cm, no significant change compared to last year   Current Meds  Medication Sig  . albuterol (PROVENTIL HFA;VENTOLIN HFA) 108 (90 Base) MCG/ACT inhaler Inhale 2 puffs into the lungs every 6 (six) hours as needed for wheezing or shortness of breath.  . cyclobenzaprine (FLEXERIL) 10 MG tablet Take 1 tablet (10 mg total) by mouth 2 (two) times daily as needed for muscle spasms.  . ENSURE (ENSURE) Take 237 mLs by mouth daily at 12 noon.  . fluticasone furoate-vilanterol (BREO ELLIPTA) 100-25 MCG/INH AEPB Inhale 1 puff into the lungs daily.   . Fluticasone-Umeclidin-Vilant 100-62.5-25 MCG/INH AEPB Inhale 1 puff into the lungs daily.  Marland Kitchen gabapentin (NEURONTIN) 400 MG capsule Take 800 mg by mouth 3 (three) times daily.  Marland Kitchen ibuprofen (ADVIL,MOTRIN) 200 MG tablet Take 400 mg by mouth 2 (two) times daily.  . sertraline (ZOLOFT) 50 MG tablet Take 1 tablet by mouth daily.  . tamsulosin (FLOMAX) 0.4 MG CAPS capsule Take 1 capsule by mouth daily. After same meal    Past Medical History:  Diagnosis Date  . Cancer ([place])   . Cough 07/13/2007   Qualifier: Diagnosis of  By: Wynona Luna   . Hypertension   . LUNG NODULE 07/13/2007   Qualifier: Diagnosis of   By: Wynona Luna     Past Surgical History:  Procedure Laterality Date  . HERNIA REPAIR      Social History Social History   Tobacco Use  . Smoking status: Current Some Day Smoker    Packs/day: 0.25    Types: Cigarettes  . Smokeless tobacco: Never Used  Substance Use Topics  . Alcohol use: Yes    Alcohol/week: 12.0 standard drinks    Types: 12 Standard drinks or equivalent per week    Comment: Drinks 3-4 beers every night  . Drug use: No    Family History Family History  Problem Relation Age of Onset  . COPD Mother     Allergies  Allergen Reactions  . Codeine Nausea And Vomiting     REVIEW OF SYSTEMS (Negative unless checked)  Constitutional: [] Weight loss  [] Fever  [] Chills Cardiac: [] Chest pain   [] Chest pressure   [] Palpitations   [] Shortness of breath when laying flat   [] Shortness of breath with exertion. Vascular:  [] Pain in legs with walking   [] Pain in legs at rest  [] History of DVT   [] Phlebitis   [] Swelling in legs   [] Varicose veins   [] Non-healing ulcers Pulmonary:   [] Uses home oxygen   [] Productive cough   [] Hemoptysis   [] Wheeze  [] COPD   [] Asthma Neurologic:  [] Dizziness   [] Seizures   [] History of stroke   [] History of TIA  []   Aphasia   [] Vissual changes   [] Weakness or numbness in arm   [] Weakness or numbness in leg Musculoskeletal:   [] Joint swelling   [] Joint pain   [] Low back pain Hematologic:  [] Easy bruising  [] Easy bleeding   [] Hypercoagulable state   [] Anemic Gastrointestinal:  [] Diarrhea   [] Vomiting  [] Gastroesophageal reflux/heartburn   [] Difficulty swallowing. Genitourinary:  [] Chronic kidney disease   [] Difficult urination  [] Frequent urination   [] Blood in urine Skin:  [] Rashes   [] Ulcers  Psychological:  [] History of anxiety   []  History of major depression.  Physical Examination  Vitals:   09/24/18 1031  BP: (!) 149/80  Pulse: 73  Resp: 12  Weight: 154 lb (69.9 kg)  Height: 5\' 7"  (1.702 m)   Body mass index is 24.12  kg/m. Gen: WD/WN, NAD Head: [place]/AT, No temporalis wasting.  Ear/Nose/Throat: Hearing grossly intact, nares w/o erythema or drainage Eyes: PER, EOMI, sclera nonicteric.  Neck: Supple, no large masses.   Pulmonary:  Good air movement, no audible wheezing bilaterally, no use of accessory muscles.  Cardiac: RRR, no JVD Vascular: Vessel Right Left  Radial Palpable Palpable  Gastrointestinal: Non-distended. No guarding/no peritoneal signs.  Musculoskeletal: M/S 5/5 throughout.  No deformity or atrophy.  Neurologic: CN 2-12 intact. Symmetrical.  Speech is fluent. Motor exam as listed above. Psychiatric: Judgment intact, Mood & affect appropriate for pt's clinical situation. Dermatologic: No rashes or ulcers noted.  No changes consistent with cellulitis. Lymph : No lichenification or skin changes of chronic lymphedema.  CBC Lab Results  Component Value Date   WBC 8.6 09/06/2016   HGB 17.3 (H) 09/06/2016   HCT 51.0 09/06/2016   MCV 97.2 09/06/2016   PLT 213 09/06/2016    BMET    Component Value Date/Time   NA 136 09/06/2016 1252   K 4.2 09/06/2016 1252   CL 99 (L) 09/06/2016 1252   CO2 30 07/06/2007 0736   GLUCOSE 92 09/06/2016 1252   BUN 15 09/06/2016 1252   CREATININE 0.80 09/06/2016 1252   CALCIUM 9.2 07/06/2007 0736   GFRNONAA 105 07/06/2007 0736   GFRAA 127 07/06/2007 0736   CrCl cannot be calculated (Patient's most recent lab result is older than the maximum 21 days allowed.).  COAG No results found for: INR, PROTIME  Radiology No results found.   Assessment/Plan 1. Thoracic aortic aneurysm without rupture ([place]) No surgery or intervention at this time. The patient has an asymptomatic thoracic aortic aneurysm that is  4.1 cm in maximal diameter.  I have discussed the natural history of  aortic aneurysm and the small risk of rupture for aneurysm less than 5 cm in size.  However, as these small aneurysms tend to enlarge over time, continued surveillance with  CT scan  is mandatory.  I have also discussed optimizing medical management with hypertension and lipid control and the importance of abstinence from tobacco.  The patient is also encouraged to exercise a minimum of 30 minutes 4 times a week.  Should the patient develop new onset abdominal or back pain or signs of peripheral embolization they are instructed to seek medical attention immediately and to alert the physician providing care that they have an aneurysm.  The patient voices their understanding. The patient will return in 12 months for review.  Given his pulmonary disease he is likely to have several CT scans a year and I will use these to follow his thoracic aneurysm.  I have explained this to him in detail.  Should he return  next year and not have a recent CT scan and one will be ordered and he understands that.  This is an attempt to avoid excessive x-ray exposure.  A CT scan of the abdomen and pelvis dated 08/24/2015 just 2 years ago demonstrated a normal abdominal aorta no evidence of aortic or iliac aneurysmal deterioration  2. Essential (primary) hypertension Continue antihypertensive medications as already ordered, these medications have been reviewed and there are no changes at this time.   3. Chronic obstructive pulmonary disease, unspecified COPD type ([place]) Continue pulmonary medications and aerosols as already ordered, these medications have been reviewed and there are no changes at this time.     Hortencia Pilar, MD  09/24/2018 10:51 AM

## 2018-10-03 DIAGNOSIS — M792 Neuralgia and neuritis, unspecified: Secondary | ICD-10-CM | POA: Diagnosis not present

## 2018-10-03 DIAGNOSIS — R0789 Other chest pain: Secondary | ICD-10-CM | POA: Diagnosis not present

## 2018-10-03 DIAGNOSIS — G588 Other specified mononeuropathies: Secondary | ICD-10-CM | POA: Diagnosis not present

## 2018-10-03 DIAGNOSIS — G8912 Acute post-thoracotomy pain: Secondary | ICD-10-CM | POA: Diagnosis not present

## 2018-10-27 DIAGNOSIS — R197 Diarrhea, unspecified: Secondary | ICD-10-CM | POA: Diagnosis not present

## 2018-12-10 ENCOUNTER — Emergency Department
Admission: EM | Admit: 2018-12-10 | Discharge: 2018-12-10 | Disposition: A | Discharge status: Home / Self Care | Payer: Medicare HMO | Attending: Emergency Medicine | Admitting: Emergency Medicine

## 2018-12-10 ENCOUNTER — Other Ambulatory Visit: Payer: Self-pay

## 2018-12-10 ENCOUNTER — Emergency Department: Payer: Medicare HMO

## 2018-12-10 DIAGNOSIS — Y9389 Activity, other specified: Secondary | ICD-10-CM | POA: Diagnosis not present

## 2018-12-10 DIAGNOSIS — R0602 Shortness of breath: Secondary | ICD-10-CM | POA: Insufficient documentation

## 2018-12-10 DIAGNOSIS — F17210 Nicotine dependence, cigarettes, uncomplicated: Secondary | ICD-10-CM | POA: Insufficient documentation

## 2018-12-10 DIAGNOSIS — X509XXA Other and unspecified overexertion or strenuous movements or postures, initial encounter: Secondary | ICD-10-CM | POA: Insufficient documentation

## 2018-12-10 DIAGNOSIS — Z79899 Other long term (current) drug therapy: Secondary | ICD-10-CM | POA: Diagnosis not present

## 2018-12-10 DIAGNOSIS — Z20828 Contact with and (suspected) exposure to other viral communicable diseases: Secondary | ICD-10-CM | POA: Insufficient documentation

## 2018-12-10 DIAGNOSIS — Y999 Unspecified external cause status: Secondary | ICD-10-CM | POA: Insufficient documentation

## 2018-12-10 DIAGNOSIS — Z902 Acquired absence of lung [part of]: Secondary | ICD-10-CM | POA: Diagnosis not present

## 2018-12-10 DIAGNOSIS — Z85118 Personal history of other malignant neoplasm of bronchus and lung: Secondary | ICD-10-CM | POA: Insufficient documentation

## 2018-12-10 DIAGNOSIS — I10 Essential (primary) hypertension: Secondary | ICD-10-CM | POA: Diagnosis not present

## 2018-12-10 DIAGNOSIS — Y92009 Unspecified place in unspecified non-institutional (private) residence as the place of occurrence of the external cause: Secondary | ICD-10-CM | POA: Insufficient documentation

## 2018-12-10 DIAGNOSIS — J449 Chronic obstructive pulmonary disease, unspecified: Secondary | ICD-10-CM | POA: Insufficient documentation

## 2018-12-10 DIAGNOSIS — S22060A Wedge compression fracture of T7-T8 vertebra, initial encounter for closed fracture: Secondary | ICD-10-CM | POA: Diagnosis not present

## 2018-12-10 DIAGNOSIS — S22000A Wedge compression fracture of unspecified thoracic vertebra, initial encounter for closed fracture: Secondary | ICD-10-CM | POA: Diagnosis not present

## 2018-12-10 DIAGNOSIS — R05 Cough: Secondary | ICD-10-CM | POA: Diagnosis not present

## 2018-12-10 DIAGNOSIS — S299XXA Unspecified injury of thorax, initial encounter: Secondary | ICD-10-CM | POA: Diagnosis present

## 2018-12-10 LAB — COMPREHENSIVE METABOLIC PANEL
ALT: 17 U/L (ref 0–44)
AST: 22 U/L (ref 15–41)
Albumin: 4.3 g/dL (ref 3.5–5.0)
Alkaline Phosphatase: 69 U/L (ref 38–126)
Anion gap: 12 (ref 5–15)
BUN: 23 mg/dL (ref 8–23)
CO2: 25 mmol/L (ref 22–32)
Calcium: 9 mg/dL (ref 8.9–10.3)
Chloride: 100 mmol/L (ref 98–111)
Creatinine, Ser: 1.06 mg/dL (ref 0.61–1.24)
GFR calc Af Amer: >60 mL/min (ref >60)
GFR calc non Af Amer: >60 mL/min (ref >60)
Glucose, Bld: 104 mg/dL — ABNORMAL HIGH (ref 70–99)
Potassium: 4.5 mmol/L (ref 3.5–5.1)
Sodium: 137 mmol/L (ref 135–145)
Total Bilirubin: 0.6 mg/dL (ref 0.3–1.2)
Total Protein: 8.1 g/dL (ref 6.5–8.1)

## 2018-12-10 LAB — CBC WITH DIFFERENTIAL/PLATELET
Abs Immature Granulocytes: 0.05 10*3/uL (ref 0.00–0.07)
Basophils Absolute: 0.1 10*3/uL (ref 0.0–0.1)
Basophils Relative: 1 %
Eosinophils Absolute: 0.2 10*3/uL (ref 0.0–0.5)
Eosinophils Relative: 2 %
HCT: 49.4 % (ref 39.0–52.0)
Hemoglobin: 16.3 g/dL (ref 13.0–17.0)
Immature Granulocytes: 1 %
Lymphocytes Relative: 22 %
Lymphs Abs: 1.7 10*3/uL (ref 0.7–4.0)
MCH: 33.7 pg (ref 26.0–34.0)
MCHC: 33 g/dL (ref 30.0–36.0)
MCV: 102.1 fL — ABNORMAL HIGH (ref 80.0–100.0)
Monocytes Absolute: 0.8 10*3/uL (ref 0.1–1.0)
Monocytes Relative: 11 %
Neutro Abs: 4.7 10*3/uL (ref 1.7–7.7)
Neutrophils Relative %: 63 %
Platelets: 203 10*3/uL (ref 150–400)
RBC: 4.84 MIL/uL (ref 4.22–5.81)
RDW: 14.3 % (ref 11.5–15.5)
WBC: 7.5 10*3/uL (ref 4.0–10.5)
nRBC: 0 % (ref 0.0–0.2)

## 2018-12-10 LAB — BRAIN NATRIURETIC PEPTIDE: B Natriuretic Peptide: 46 pg/mL (ref 0.0–100.0)

## 2018-12-10 LAB — FIBRIN DERIVATIVES D-DIMER (ARMC ONLY): Fibrin derivatives D-dimer (ARMC): 989.69 ng/mL (FEU) — ABNORMAL HIGH (ref 0.00–499.00)

## 2018-12-10 LAB — SARS CORONAVIRUS 2 BY RT PCR (HOSPITAL ORDER, PERFORMED IN ~~LOC~~ HOSPITAL LAB): SARS Coronavirus 2: NEGATIVE

## 2018-12-10 LAB — TROPONIN I: Troponin I: <0.03 ng/mL (ref <0.03)

## 2018-12-10 MED ORDER — IOHEXOL 350 MG/ML SOLN
75.0000 mL | Freq: Once | INTRAVENOUS | Status: AC | PRN
  Administered 2018-12-10: 11:00:00 75 mL via INTRAVENOUS
  Filled 2018-12-10: qty 75

## 2018-12-10 MED ORDER — HYDROCODONE-ACETAMINOPHEN 5-325 MG PO TABS
1.0000 | ORAL_TABLET | Freq: Once | ORAL | Status: AC
  Administered 2018-12-10: 1 via ORAL
  Filled 2018-12-10: qty 1

## 2018-12-10 MED ORDER — HYDROCODONE-ACETAMINOPHEN 5-325 MG PO TABS: 1.0000 | ORAL_TABLET | ORAL | 0 refills | Status: DC | PRN

## 2018-12-10 NOTE — ED Triage Notes (Signed)
Pt sent from [facility] with c/o BL rib pain from the front into the back for the past week, with cough and SOB, states he has been busy building and thought he had just pulled something but it has been getting worse. Pt has a hx of lung cancer with lobectomy of the right upper lobe. Pt is in NAD on arrival.

## 2018-12-10 NOTE — ED Provider Notes (Signed)
[facility] Emergency Department Provider Note   ____________________________________________    I have reviewed the triage vital signs and the nursing notes.   HISTORY  Chief Complaint Flank Pain and Shortness of Breath     HPI Stephen Leon is a 73 y.o. male who presents with complaints of bilateral back/flank pain, cough and increased shortness of breath over the last 2 weeks.  Patient reports he has been building a small house and attributed the pain to muscle soreness however it is gotten worse and he feels more short of breath than typical and he does have a cough.  He denies fevers or chills.  He has been taking precautions and avoiding public places and wearing a mask.  No sick contacts.  No travel.  Denies fever.  Has not take anything for this.  Does have a history of a lobectomy and history of COPD  Past Medical History:  Diagnosis Date  . Cancer ([place])   . Cough 07/13/2007   Qualifier: Diagnosis of  By: Wynona Luna   . Hypertension   . LUNG NODULE 07/13/2007   Qualifier: Diagnosis of  By: Wynona Luna     Patient Active Problem List   Diagnosis Date Noted  . Aneurysm of thoracic aorta ([place]) 09/23/2017  . Chronic pain syndrome 07/11/2016  . History of narcotic addiction ([place]) 07/11/2016  . 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 GAD (generalized anxiety disorder) 12/15/2015  . 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 COPD (chronic obstructive pulmonary disease) ([place]) 09/28/2015  . Malignant neoplastic disease ([place]) 09/28/2015  . Acute bronchitis with bronchospasm 08/28/2015  . Essential (primary) hypertension 06/16/2015  . 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 Cancer of lung ([place]) 05/16/2012  . Tobacco user 05/04/2012  . Non-small cell carcinoma of lung ([place]) 05/04/2012    Past Surgical History:  Procedure  Laterality Date  . HERNIA REPAIR      Prior to Admission medications   Medication Sig Start Date End Date Taking? Authorizing Provider  albuterol (PROVENTIL HFA;VENTOLIN HFA) 108 (90 Base) MCG/ACT inhaler Inhale 2 puffs into the lungs every 6 (six) hours as needed for wheezing or shortness of breath.    [provider]  clarithromycin (BIAXIN) 500 MG tablet  08/15/17   [provider]  cyclobenzaprine (FLEXERIL) 10 MG tablet Take 1 tablet (10 mg total) by mouth 2 (two) times daily as needed for muscle spasms. Patient not taking: Reported on 12/10/2018 09/06/16   Blanchie Dessert, MD  diclofenac sodium (VOLTAREN) 1 % GEL Apply 2 g topically 4 (four) times daily. Patient not taking: Reported on 09/24/2018 09/06/16   Blanchie Dessert, MD  ENSURE (ENSURE) Take 237 mLs by mouth daily at 12 noon.    [provider]  fluticasone furoate-vilanterol (BREO ELLIPTA) 100-25 MCG/INH AEPB Inhale 1 puff into the lungs daily.  06/16/16   [provider]  Fluticasone-Umeclidin-Vilant 100-62.5-25 MCG/INH AEPB Inhale 1 puff into the lungs daily. 09/13/18   [provider]  gabapentin (NEURONTIN) 400 MG capsule Take 400 mg by mouth 3 (three) times daily. Reported on 09/28/2015 08/28/15 09/06/16  [provider]  gabapentin (NEURONTIN) 400 MG capsule Take 800 mg by mouth 3 (three) times daily. 01/04/17   [provider]  HYDROcodone-acetaminophen (NORCO/VICODIN) 5-325 MG tablet Take 1 tablet by mouth every 4 (four) hours as needed for moderate pain. 12/10/18  Lavonia Drafts, MD  ibuprofen (ADVIL,MOTRIN) 200 MG tablet Take 400 mg by mouth 2 (two) times daily.    [provider]  ibuprofen (ADVIL,MOTRIN) 200 MG tablet Take 400 mg by mouth 2 (two) times daily.    [provider]  ondansetron (ZOFRAN) 4 MG tablet Take 1 tablet (4 mg total) by mouth every 6 (six) hours. Patient not taking: Reported on 09/21/2017 09/06/16   Blanchie Dessert, MD   pregabalin (LYRICA) 75 MG capsule Take 1 capsule (75 mg total) by mouth 3 (three) times daily. Patient not taking: Reported on 09/06/2016 07/11/16 09/09/16  Milinda Pointer, MD  sertraline (ZOLOFT) 50 MG tablet Take 1 tablet by mouth daily. 09/11/18   [provider]  tamsulosin (FLOMAX) 0.4 MG CAPS capsule Take 1 capsule by mouth daily. After same meal 05/22/17   [provider]     Allergies Codeine  Family History  Problem Relation Age of Onset  . COPD Mother     Social History Social History   Tobacco Use  . Smoking status: Current Some Day Smoker    Packs/day: 0.25    Types: Cigarettes  . Smokeless tobacco: Never Used  Substance Use Topics  . Alcohol use: Yes    Alcohol/week: 12.0 standard drinks    Types: 12 Standard drinks or equivalent per week    Comment: Drinks 3-4 beers every night  . Drug use: No    Review of Systems  Constitutional: No fever/chills Eyes: No visual changes.  ENT: No sore throat. Cardiovascular: As above Respiratory: As above Gastrointestinal: No abdominal pain.  No nausea, no vomiting.   Genitourinary: Negative for dysuria. Musculoskeletal: As above Skin: Negative for rash. Neurological: Negative for headaches or weakness   ____________________________________________   PHYSICAL EXAM:  VITAL SIGNS: ED Triage Vitals  Enc Vitals Group     BP 12/10/18 0902 140/84     Pulse Rate 12/10/18 0902 70     Resp 12/10/18 0902 18     Temp 12/10/18 0902 97.9 F (36.6 C)     Temp Source 12/10/18 0902 Oral     SpO2 12/10/18 0902 93 %     Weight 12/10/18 0852 70.3 kg (155 lb)     Height 12/10/18 0852 1.727 m (5\' 8" )     Head Circumference --      Peak Flow --      Pain Score 12/10/18 0852 9     Pain Loc --      Pain Edu? --      Excl. in [place]? --     Constitutional: Alert and oriented.  Eyes: Conjunctivae are normal.  . Nose: No congestion/rhinnorhea. Mouth/Throat: Mucous membranes are moist.    Cardiovascular:  Normal rate, regular rhythm. Grossly normal heart sounds.  Good peripheral circulation. Respiratory: Normal respiratory effort.  No retractions.  Gastrointestinal: Soft and nontender. No distention.    Musculoskeletal: No lower extremity tenderness nor edema.  Warm and well perfused Neurologic:  Normal speech and language. No gross focal neurologic deficits are appreciated.  Skin:  Skin is warm, dry and intact.  No chest rash Psychiatric: Mood and affect are normal. Speech and behavior are normal.  ____________________________________________   LABS (all labs ordered are listed, but only abnormal results are displayed)  Labs Reviewed  FIBRIN DERIVATIVES D-DIMER ([facility] ONLY) - Abnormal; Notable for the following components:      Result Value   Fibrin derivatives D-dimer [facility]) 989.69 (*)    All other components within normal limits  CBC WITH DIFFERENTIAL/PLATELET - Abnormal; Notable for the following components:   MCV 102.1 (*)    All other components within normal limits  COMPREHENSIVE METABOLIC PANEL - Abnormal; Notable for the following components:   Glucose, Bld 104 (*)    All other components within normal limits  SARS CORONAVIRUS 2 (HOSPITAL ORDER, [place] [facility])  CULTURE, BLOOD (ROUTINE X 2)  CULTURE, BLOOD (ROUTINE X 2)  TROPONIN I  BRAIN NATRIURETIC PEPTIDE   ____________________________________________  EKG  ED ECG REPORT I, Lavonia Drafts, the attending physician, personally viewed and interpreted this ECG.  Date: 12/10/2018  Rhythm: normal sinus rhythm QRS Axis: normal Intervals: normal ST/T Wave abnormalities: normal Narrative Interpretation: no evidence of acute ischemia  ____________________________________________  RADIOLOGY  Chest x-ray unremarkable CT angiography negative for PE ____________________________________________   PROCEDURES  Procedure(s) performed: No  Procedures   Critical Care performed: No  ____________________________________________   INITIAL IMPRESSION / ASSESSMENT AND PLAN / ED COURSE  Pertinent labs & imaging results that were available during my care of the patient were reviewed by me and considered in my medical decision making (see chart for details).  Patient presents with bilateral lower posterior chest wall pain/back pain with a wet sounding cough over the last 2 weeks.  Does have history of COPD.  Differential includes pneumonia, COVID-19, muscle injury.  We will obtain labs, chest x-ray send COVID swab and carefully monitor   ----------------------------------------- 12:10 PM on 12/10/2018 -----------------------------------------  Patient's coronavirus swab was negative, mildly elevated d-dimer so sent for CT angiography to rule out PE.  No PE on scan however T7 compression fracture noted, this is almost certainly the cause of his pain.  We discussed this at some length, because his pain is significant we will give him hydrocodone which he will take sparingly, he understands risk of addiction.  Will refer him to Ortho for evaluation of kyphoplasty         Stephen Leon was evaluated in Emergency Department on 12/10/2018 for the symptoms described in the history of present illness. He was evaluated in the context of the global COVID-19 pandemic, which necessitated consideration that the patient might be at risk for infection with the SARS-CoV-2 virus that causes COVID-19. Institutional protocols and algorithms that pertain to the evaluation of patients at risk for COVID-19 are in a state of rapid change based on information released by regulatory bodies including the CDC and federal and state organizations. These policies and algorithms were followed during the patient's care in the ED.     ____________________________________________   FINAL CLINICAL IMPRESSION(S) / ED DIAGNOSES  Final diagnoses:  Compression fracture of body of thoracic vertebra  [facility])        Note:  This document was prepared using Dragon voice recognition software and may include unintentional dictation errors.   Lavonia Drafts, MD 12/10/18 1212

## 2018-12-11 ENCOUNTER — Other Ambulatory Visit (HOSPITAL_COMMUNITY): Payer: Self-pay | Admitting: Orthopedic Surgery

## 2018-12-11 ENCOUNTER — Other Ambulatory Visit: Payer: Self-pay | Admitting: Orthopedic Surgery

## 2018-12-11 ENCOUNTER — Ambulatory Visit (HOSPITAL_COMMUNITY)
Admission: RE | Admit: 2018-12-11 | Discharge: 2018-12-11 | Disposition: A | Discharge status: Home / Self Care | Payer: Medicare HMO | Source: Ambulatory Visit | Attending: Orthopedic Surgery | Admitting: Orthopedic Surgery

## 2018-12-11 DIAGNOSIS — S22060A Wedge compression fracture of T7-T8 vertebra, initial encounter for closed fracture: Secondary | ICD-10-CM

## 2018-12-11 DIAGNOSIS — M545 Low back pain: Secondary | ICD-10-CM | POA: Diagnosis not present

## 2018-12-12 ENCOUNTER — Other Ambulatory Visit: Payer: Self-pay

## 2018-12-12 ENCOUNTER — Encounter
Admission: RE | Admit: 2018-12-12 | Discharge: 2018-12-12 | Disposition: A | Discharge status: Home / Self Care | Payer: Medicare HMO | Source: Ambulatory Visit | Attending: Orthopedic Surgery | Admitting: Orthopedic Surgery

## 2018-12-12 HISTORY — DX: Chronic obstructive pulmonary disease, unspecified: J44.9

## 2018-12-12 MED ORDER — CEFAZOLIN SODIUM-DEXTROSE 2-4 GM/100ML-% IV SOLN
2.0000 g | Freq: Once | INTRAVENOUS | Status: AC
  Administered 2018-12-13: 2 g via INTRAVENOUS

## 2018-12-12 NOTE — Pre-Procedure Instructions (Signed)
Schnier, Dolores Lory, MD  Physician  Vascular Surgery  Progress Notes  Signed  Encounter Date:  09/24/2018          Signed      Expand All Collapse All    Show:Clear all [x] Manual[x] Template[x] Copied  Added by: [x] Schnier, Dolores Lory, MD  [] Hover for details    MRN : [id number]  Stephen Leon is a 73 y.o. (Aug 14, 1945) male who presents with chief complaint of     Chief Complaint  Patient presents with  . Follow-up  .  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 The aneurysm was found incidentally by CT scan. Patient denies chestabdominal pain or unusual back pain, no other abdominal complaints. No history of an acute onset of painful blue discoloration of the toes.   No family history ofTAA/AAA.   Patient denies amaurosis fugax or TIA symptoms. There is no history of claudication or rest pain symptoms of the lower extremities. The patient denies angina or shortness of breath.  CT scan shows anTAA that measures4.20cm, no significant change compared to last year   ActiveMedications      Current Meds  Medication Sig  . albuterol (PROVENTIL HFA;VENTOLIN HFA) 108 (90 Base) MCG/ACT inhaler Inhale 2 puffs into the lungs every 6 (six) hours as needed for wheezing or shortness of breath.  . cyclobenzaprine (FLEXERIL) 10 MG tablet Take 1 tablet (10 mg total) by mouth 2 (two) times daily as needed for muscle spasms.  . ENSURE (ENSURE) Take 237 mLs by mouth daily at 12 noon.  . fluticasone furoate-vilanterol (BREO ELLIPTA) 100-25 MCG/INH AEPB Inhale 1 puff into the lungs daily.   . Fluticasone-Umeclidin-Vilant 100-62.5-25 MCG/INH AEPB Inhale 1 puff into the lungs daily.  Marland Kitchen gabapentin (NEURONTIN) 400 MG capsule Take 800 mg by mouth 3 (three) times daily.  Marland Kitchen ibuprofen (ADVIL,MOTRIN) 200 MG tablet Take 400 mg by mouth 2 (two) times daily.  . sertraline (ZOLOFT) 50 MG tablet Take 1 tablet  by mouth daily.  . tamsulosin (FLOMAX) 0.4 MG CAPS capsule Take 1 capsule by mouth daily. After same meal          Past Medical History:  Diagnosis Date  . Cancer ([place])   . Cough 07/13/2007   Qualifier: Diagnosis of  By: Wynona Luna   . Hypertension   . 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 HERNIA REPAIR      Social History Social History        Tobacco Use  . Smoking status: Current Some Day Smoker    Packs/day: 0.25    Types: Cigarettes  . Smokeless tobacco: Never Used  Substance Use Topics  . Alcohol use: Yes    Alcohol/week: 12.0 standard drinks    Types: 12 Standard drinks or equivalent per week    Comment: Drinks 3-4 beers every night  . Drug use: No    Family History      Family History  Problem Relation Age of Onset  . COPD Mother         Allergies  Allergen Reactions  . Codeine Nausea And Vomiting     REVIEW OF SYSTEMS (Negative unless checked)  Constitutional: [] ?Weight loss  [] ?Fever  [] ?Chills Cardiac: [] ?Chest pain   [] ?Chest pressure   [] ?Palpitations   [] ?Shortness of breath when laying  flat   [] ?Shortness of breath with exertion. Vascular:  [] ?Pain in legs with walking   [] ?Pain in legs at rest  [] ?History of DVT   [] ?Phlebitis   [] ?Swelling in legs   [] ?Varicose veins   [] ?Non-healing ulcers Pulmonary:   [] ?Uses home oxygen   [] ?Productive cough   [] ?Hemoptysis   [] ?Wheeze  [] ?COPD   [] ?Asthma Neurologic:  [] ?Dizziness   [] ?Seizures   [] ?History of stroke   [] ?History of TIA  [] ?Aphasia   [] ?Vissual changes   [] ?Weakness or numbness in arm   [] ?Weakness or numbness in leg Musculoskeletal:   [] ?Joint swelling   [] ?Joint pain   [] ?Low back pain Hematologic:  [] ?Easy bruising  [] ?Easy bleeding   [] ?Hypercoagulable state   [] ?Anemic Gastrointestinal:  [] ?Diarrhea   [] ?Vomiting  [] ?Gastroesophageal  reflux/heartburn   [] ?Difficulty swallowing. Genitourinary:  [] ?Chronic kidney disease   [] ?Difficult urination  [] ?Frequent urination   [] ?Blood in urine Skin:  [] ?Rashes   [] ?Ulcers  Psychological:  [] ?History of anxiety   [] ? History of major depression.  Physical Examination     Vitals:   09/24/18 1031  BP: (!) 149/80  Pulse: 73  Resp: 12  Weight: 154 lb (69.9 kg)  Height: 5\' 7"  (1.702 m)   Body mass index is 24.12 kg/m. Gen: WD/WN, NAD Head: [place]/AT, No temporalis wasting.  Ear/Nose/Throat: Hearing grossly intact, nares w/o erythema or drainage Eyes: PER, EOMI, sclera nonicteric.  Neck: Supple, no large masses.   Pulmonary:  Good air movement, no audible wheezing bilaterally, no use of accessory muscles.  Cardiac: RRR, no JVD Vascular: Vessel Right Left  Radial Palpable Palpable  Gastrointestinal: Non-distended. No guarding/no peritoneal signs.  Musculoskeletal: M/S 5/5 throughout.  No deformity or atrophy.  Neurologic: CN 2-12 intact. Symmetrical.  Speech is fluent. Motor exam as listed above. Psychiatric: Judgment intact, Mood & affect appropriate for pt's clinical situation. Dermatologic: No rashes or ulcers noted.  No changes consistent with cellulitis. Lymph : No lichenification or skin changes of chronic lymphedema.  CBC RecentLabs  Lab Results  Component Value Date   WBC 8.6 09/06/2016   HGB 17.3 (H) 09/06/2016   HCT 51.0 09/06/2016   MCV 97.2 09/06/2016   PLT 213 09/06/2016      BMET Labs(Brief)          Component Value Date/Time   NA 136 09/06/2016 1252   K 4.2 09/06/2016 1252   CL 99 (L) 09/06/2016 1252   CO2 30 07/06/2007 0736   GLUCOSE 92 09/06/2016 1252   BUN 15 09/06/2016 1252   CREATININE 0.80 09/06/2016 1252   CALCIUM 9.2 07/06/2007 0736   GFRNONAA 105 07/06/2007 0736   GFRAA 127 07/06/2007 0736     CrCl cannot be calculated (Patient's most recent lab result is older than the maximum 21 days allowed.).   COAG RecentLabs  No results found for: INR, PROTIME    Radiology ImagingResults  No results found.     Assessment/Plan 1. Thoracic aortic aneurysm without rupture ([place]) No surgery or intervention at this time. The patient has an asymptomaticthoracicaortic aneurysm that is 4.1cm in maximal diameter. I have discussed the natural history of aortic aneurysm and the small risk of rupture for aneurysm less than 5 cm in size. However, as these small aneurysms tend to enlarge over time, continued surveillance with CT scan is mandatory.  I have also discussed optimizing medical management with hypertension and lipid control and the importance of abstinence from tobacco. The patient is also encouraged to exercise a minimum of  30 minutes 4 times a week.  Should the patient develop new onset abdominal or back pain or signs of peripheral embolization they are instructed to seek medical attention immediately and to alert the physician providing care that they have an aneurysm.  The patient voices their understanding. The patient will return in 12 monthsfor review. Given his pulmonary disease he is likely to have several CT scans a year and I will use these to follow his thoracic aneurysm. I have explained this to him in detail. Should he return next year and not have a recent CT scan and one will be ordered and he understands that. This is an attempt to avoid excessive x-ray exposure.  A CT scan of the abdomen and pelvis dated 08/24/2015 just 2 years ago demonstrated a normal abdominal aorta no evidence of aortic or iliac aneurysmal deterioration  2. Essential (primary) hypertension Continue antihypertensive medications as already ordered, these medications have been reviewed and there are no changes at this time.   3. Chronic obstructive pulmonary disease, unspecified COPD type ([place]) Continue pulmonary medications and aerosols as already ordered, these medications have been  reviewed and there are no changes at this time.     Hortencia Pilar, MD  09/24/2018 10:51 AM        Electronically signed by Katha Cabal, MD at 09/24/2018 9:46 PM     Office Visit on 09/24/2018       Detailed Report

## 2018-12-12 NOTE — Patient Instructions (Signed)
Your procedure is scheduled on: 12-13-18 Report to Same Day Surgery 2nd floor medical mall [facility] Entrance-take elevator on left to 2nd floor.  Check in with surgery information desk.) @ 9 am per pt   Remember: Instructions that are not followed completely may result in serious medical risk, up to and including death, or upon the discretion of your surgeon and anesthesiologist your surgery may need to be rescheduled.    _x___ 1. Do not eat food after midnight the night before your procedure. You may drink clear liquids up to 2 hours before you are scheduled to arrive at the hospital for your procedure.  Do not drink clear liquids within 2 hours of your scheduled arrival to the hospital.  Clear liquids include  --Water or Apple juice without pulp  --Clear carbohydrate beverage such as ClearFast or Gatorade  --Black Coffee or Clear Tea (No milk, no creamers, do not add anything to the coffee or Tea   ____Ensure clear carbohydrate drink on the way to the hospital for bariatric patients  ____Ensure clear carbohydrate drink 3 hours before surgery for Dr Dwyane Luo patients if physician instructed.   No gum chewing or hard candies.     __x__ 2. No Alcohol for 24 hours before or after surgery.   __x__3. No Smoking or e-cigarettes for 24 prior to surgery.  Do not use any chewable tobacco products for at least 6 hour prior to surgery   ____  4. Bring all medications with you on the day of surgery if instructed.    __x__ 5. Notify your doctor if there is any change in your medical condition     (cold, fever, infections).    x___6. On the morning of surgery brush your teeth with toothpaste and water.  You may rinse your mouth with mouth wash if you wish.  Do not swallow any toothpaste or mouthwash.   Do not wear jewelry, make-up, hairpins, clips or nail polish.  Do not wear lotions, powders, or perfumes. You may wear deodorant.  Do not shave 48 hours prior to surgery. Men may shave face  and neck.  Do not bring valuables to the hospital.    [facility] is not responsible for any belongings or valuables.               Contacts, dentures or bridgework may not be worn into surgery.  Leave your suitcase in the car. After surgery it may be brought to your room.  For patients admitted to the hospital, discharge time is determined by your treatment team.  _  Patients discharged the day of surgery will not be allowed to drive home.  You will need someone to drive you home and stay with you the night of your procedure.    Please read over the following fact sheets that you were given:   [facility] Preparing for Surgery   _x___ TAKE THE FOLLOWING MEDICATION THE MORNING OF SURGERY WITH A SMALL SIP OF WATER. These include:  1. GABAPENTIN   2.  3.  4.  5.  6.  ____Fleets enema or Magnesium Citrate as directed.   ____ Use CHG Soap or sage wipes as directed on instruction sheet   ____ Use inhalers on the day of surgery and bring to hospital day of surgery  ____ Stop Metformin and Janumet 2 days prior to surgery.    ____ Take 1/2 of usual insulin dose the night before surgery and none on the morning surgery.   ____  Follow recommendations from Cardiologist, Pulmonologist or PCP regarding stopping Aspirin, Coumadin, Plavix ,Eliquis, Effient, or Pradaxa, and Pletal.  X____Stop Anti-inflammatories such as Advil, Aleve, Ibuprofen, Motrin, Naproxen, Naprosyn, Goodies powders or aspirin products NOW-OK to take Tylenol   ____ Stop supplements until after surgery   ____ Bring C-Pap to the hospital.

## 2018-12-12 NOTE — Pre-Procedure Instructions (Signed)
EKG  ED ECG REPORT I, Lavonia Drafts, the attending physician, personally viewed and interpreted this ECG.  Date: 12/10/2018  Rhythm: normal sinus rhythm QRS Axis: normal Intervals: normal ST/T Wave abnormalities: normal Narrative Interpretation: no evidence of acute ischemia  ____________________________________________  RADIOLOGY  Chest x-ray unremarkable CT angiography negative for PE ____________________________________________   PROCEDURES  Procedure(s) performed: No  Procedures   Critical Care performed: No ____________________________________________   INITIAL IMPRESSION / ASSESSMENT AND PLAN / ED COURSE  Pertinent labs & imaging results that were available during my care of the patient were reviewed by me and considered in my medical decision making (see chart for details).  Patient presents with bilateral lower posterior chest wall pain/back pain with a wet sounding cough over the last 2 weeks.  Does have history of COPD.  Differential includes pneumonia, COVID-19, muscle injury.  We will obtain labs, chest x-ray send COVID swab and carefully monitor   ----------------------------------------- 12:10 PM on 12/10/2018 -----------------------------------------  Patient's coronavirus swab was negative, mildly elevated d-dimer so sent for CT angiography to rule out PE.  No PE on scan however T7 compression fracture noted, this is almost certainly the cause of his pain.  We discussed this at some length, because his pain is significant we will give him hydrocodone which he will take sparingly, he understands risk of addiction.  Will refer him to Ortho for evaluation of kyphoplasty         Stephen Leon was evaluated in Emergency Department on 12/10/2018 for the symptoms described in the history of present illness. He was evaluated in the context of the global COVID-19 pandemic, which necessitated consideration that the patient  might be at risk for infection with the SARS-CoV-2 virus that causes COVID-19. Institutional protocols and algorithms that pertain to the evaluation of patients at risk for COVID-19 are in a state of rapid change based on information released by regulatory bodies including the CDC and federal and state organizations. These policies and algorithms were followed during the patient's care in the ED.   ____________________________________________   FINAL CLINICAL IMPRESSION(S) / ED DIAGNOSES  Final diagnoses:  Compression fracture of body of thoracic vertebra [facility])        Note:  This document was prepared using Dragon voice recognition software and may include unintentional dictation errors.   Lavonia Drafts, MD 12/10/18 1212         Electronically signed by Lavonia Drafts, MD at 12/10/2018 12:12 PM     ED on 12/10/2018       Detailed Report

## 2018-12-13 ENCOUNTER — Ambulatory Visit: Payer: Medicare HMO | Admitting: Anesthesiology

## 2018-12-13 ENCOUNTER — Encounter
Admission: RE | Disposition: A | Discharge status: Home / Self Care | Payer: Self-pay | Source: Home / Self Care | Attending: Orthopedic Surgery

## 2018-12-13 ENCOUNTER — Ambulatory Visit: Payer: Medicare HMO

## 2018-12-13 ENCOUNTER — Other Ambulatory Visit: Payer: Self-pay

## 2018-12-13 ENCOUNTER — Ambulatory Visit
Admission: RE | Admit: 2018-12-13 | Discharge: 2018-12-13 | Disposition: A | Discharge status: Home / Self Care | Payer: Medicare HMO | Attending: Orthopedic Surgery | Admitting: Orthopedic Surgery

## 2018-12-13 DIAGNOSIS — X500XXA Overexertion from strenuous movement or load, initial encounter: Secondary | ICD-10-CM | POA: Diagnosis not present

## 2018-12-13 DIAGNOSIS — I10 Essential (primary) hypertension: Secondary | ICD-10-CM | POA: Diagnosis not present

## 2018-12-13 DIAGNOSIS — Z85118 Personal history of other malignant neoplasm of bronchus and lung: Secondary | ICD-10-CM | POA: Insufficient documentation

## 2018-12-13 DIAGNOSIS — Z79899 Other long term (current) drug therapy: Secondary | ICD-10-CM | POA: Insufficient documentation

## 2018-12-13 DIAGNOSIS — J449 Chronic obstructive pulmonary disease, unspecified: Secondary | ICD-10-CM | POA: Diagnosis not present

## 2018-12-13 DIAGNOSIS — Y9389 Activity, other specified: Secondary | ICD-10-CM | POA: Insufficient documentation

## 2018-12-13 DIAGNOSIS — Z902 Acquired absence of lung [part of]: Secondary | ICD-10-CM | POA: Diagnosis not present

## 2018-12-13 DIAGNOSIS — Z885 Allergy status to narcotic agent status: Secondary | ICD-10-CM | POA: Insufficient documentation

## 2018-12-13 DIAGNOSIS — Z7951 Long term (current) use of inhaled steroids: Secondary | ICD-10-CM | POA: Insufficient documentation

## 2018-12-13 DIAGNOSIS — Z419 Encounter for procedure for purposes other than remedying health state, unspecified: Secondary | ICD-10-CM

## 2018-12-13 DIAGNOSIS — S22060A Wedge compression fracture of T7-T8 vertebra, initial encounter for closed fracture: Secondary | ICD-10-CM | POA: Diagnosis not present

## 2018-12-13 DIAGNOSIS — C3490 Malignant neoplasm of unspecified part of unspecified bronchus or lung: Secondary | ICD-10-CM | POA: Diagnosis not present

## 2018-12-13 DIAGNOSIS — S22050A Wedge compression fracture of T5-T6 vertebra, initial encounter for closed fracture: Secondary | ICD-10-CM | POA: Diagnosis not present

## 2018-12-13 HISTORY — PX: KYPHOPLASTY: SHX5884

## 2018-12-13 SURGERY — KYPHOPLASTY
Anesthesia: General | Site: Back

## 2018-12-13 MED ORDER — LACTATED RINGERS IV SOLN
INTRAVENOUS | Status: DC
  Administered 2018-12-13: 10:00:00 via INTRAVENOUS

## 2018-12-13 MED ORDER — METOCLOPRAMIDE HCL 10 MG PO TABS: 5.0000 mg | ORAL_TABLET | Freq: Three times a day (TID) | ORAL | Status: DC | PRN

## 2018-12-13 MED ORDER — FENTANYL CITRATE (PF) 100 MCG/2ML IJ SOLN
INTRAMUSCULAR | Status: DC | PRN
  Administered 2018-12-13: 50 ug via INTRAVENOUS

## 2018-12-13 MED ORDER — BUPIVACAINE-EPINEPHRINE (PF) 0.5% -1:200000 IJ SOLN
INTRAMUSCULAR | Status: DC | PRN
  Administered 2018-12-13: 20 mL via PERINEURAL

## 2018-12-13 MED ORDER — IOHEXOL 180 MG/ML  SOLN
INTRAMUSCULAR | Status: DC | PRN
  Administered 2018-12-13: 12:00:00 40 mL

## 2018-12-13 MED ORDER — FAMOTIDINE 20 MG PO TABS
ORAL_TABLET | ORAL | Status: AC
  Filled 2018-12-13: qty 1

## 2018-12-13 MED ORDER — MIDAZOLAM HCL 2 MG/2ML IJ SOLN
INTRAMUSCULAR | Status: AC
  Filled 2018-12-13: qty 2

## 2018-12-13 MED ORDER — MIDAZOLAM HCL 5 MG/5ML IJ SOLN
INTRAMUSCULAR | Status: DC | PRN
  Administered 2018-12-13: 2 mg via INTRAVENOUS

## 2018-12-13 MED ORDER — ONDANSETRON HCL 4 MG/2ML IJ SOLN: 4.0000 mg | Freq: Four times a day (QID) | INTRAMUSCULAR | Status: DC | PRN

## 2018-12-13 MED ORDER — CEFAZOLIN SODIUM-DEXTROSE 2-4 GM/100ML-% IV SOLN
INTRAVENOUS | Status: AC
  Filled 2018-12-13: qty 100

## 2018-12-13 MED ORDER — FAMOTIDINE 20 MG PO TABS
20.0000 mg | ORAL_TABLET | Freq: Once | ORAL | Status: AC
  Administered 2018-12-13: 20 mg via ORAL

## 2018-12-13 MED ORDER — PROPOFOL 500 MG/50ML IV EMUL
INTRAVENOUS | Status: DC | PRN
  Administered 2018-12-13: 50 ug/kg/min via INTRAVENOUS

## 2018-12-13 MED ORDER — ONDANSETRON HCL 4 MG/2ML IJ SOLN: 4.0000 mg | Freq: Once | INTRAMUSCULAR | Status: DC | PRN

## 2018-12-13 MED ORDER — PROPOFOL 10 MG/ML IV BOLUS
INTRAVENOUS | Status: DC | PRN
  Administered 2018-12-13: 20 mg via INTRAVENOUS

## 2018-12-13 MED ORDER — FENTANYL CITRATE (PF) 100 MCG/2ML IJ SOLN: 25.0000 ug | INTRAMUSCULAR | Status: DC | PRN

## 2018-12-13 MED ORDER — METOCLOPRAMIDE HCL 5 MG/ML IJ SOLN: 5.0000 mg | Freq: Three times a day (TID) | INTRAMUSCULAR | Status: DC | PRN

## 2018-12-13 MED ORDER — FENTANYL CITRATE (PF) 100 MCG/2ML IJ SOLN
INTRAMUSCULAR | Status: AC
  Filled 2018-12-13: qty 2

## 2018-12-13 MED ORDER — HYDROCODONE-ACETAMINOPHEN 5-325 MG PO TABS: 1.0000 | ORAL_TABLET | Freq: Four times a day (QID) | ORAL | 0 refills | Status: DC | PRN

## 2018-12-13 MED ORDER — LIDOCAINE HCL 1 % IJ SOLN
INTRAMUSCULAR | Status: DC | PRN
  Administered 2018-12-13: 10 mL

## 2018-12-13 MED ORDER — ONDANSETRON HCL 4 MG PO TABS: 4.0000 mg | ORAL_TABLET | Freq: Four times a day (QID) | ORAL | Status: DC | PRN

## 2018-12-13 MED ORDER — SODIUM CHLORIDE 0.9 % IV SOLN: INTRAVENOUS | Status: DC

## 2018-12-13 SURGICAL SUPPLY — 23 items
ADH SKN CLS APL DERMABOND .7 (GAUZE/BANDAGES/DRESSINGS) ×1
CEMENT KYPHON CX01A KIT/MIXER (Cement) ×3 IMPLANT
COVER WAND RF STERILE (DRAPES) ×3 IMPLANT
DERMABOND ADVANCED (GAUZE/BANDAGES/DRESSINGS) ×2
DERMABOND ADVANCED .7 DNX12 (GAUZE/BANDAGES/DRESSINGS) ×1 IMPLANT
DEVICE BIOPSY BONE KYPH (INSTRUMENTS) ×2 IMPLANT
DEVICE BIOPSY BONE KYPHX (INSTRUMENTS) ×3 IMPLANT
DRAPE C-ARM XRAY 36X54 (DRAPES) ×3 IMPLANT
DURAPREP 26ML APPLICATOR (WOUND CARE) ×3 IMPLANT
FEE RENTAL RFA GENERATOR (MISCELLANEOUS) IMPLANT
GLOVE SURG SYN 9.0  PF PI (GLOVE) ×6
GLOVE SURG SYN 9.0 PF PI (GLOVE) ×1 IMPLANT
GOWN SRG 2XL LVL 4 RGLN SLV (GOWNS) ×1 IMPLANT
GOWN STRL NON-REIN 2XL LVL4 (GOWNS) ×3
GOWN STRL REUS W/ TWL LRG LVL3 (GOWN DISPOSABLE) ×1 IMPLANT
GOWN STRL REUS W/TWL LRG LVL3 (GOWN DISPOSABLE) ×3
PACK KYPHOPLASTY (MISCELLANEOUS) ×3 IMPLANT
RENTAL RFA  GENERATOR (MISCELLANEOUS)
RENTAL RFA GENERATOR (MISCELLANEOUS) IMPLANT
STRAP SAFETY 5IN WIDE (MISCELLANEOUS) ×3 IMPLANT
TRAY KYPHOPAK 15/2 EXPRESS (KITS) ×2 IMPLANT
TRAY KYPHOPAK 15/3 EXPRESS 1ST (MISCELLANEOUS) ×1 IMPLANT
TRAY KYPHOPAK 20/3 EXPRESS 1ST (MISCELLANEOUS) ×1 IMPLANT

## 2018-12-13 NOTE — Anesthesia Post-op Follow-up Note (Signed)
Anesthesia QCDR form completed.        

## 2018-12-13 NOTE — Transfer of Care (Signed)
Immediate Anesthesia Transfer of Care Note  Patient: Stephen Leon  Procedure(s) Performed: KYPHOPLASTY- T7 (N/A Back)  Patient Location: PACU  Anesthesia Type:General  Level of Consciousness: awake, alert  and oriented  Airway & Oxygen Therapy: Patient Spontanous Breathing  Post-op Assessment: Report given to RN and Post -op Vital signs reviewed and stable  Post vital signs: Reviewed and stable  Last Vitals:  Vitals Value Taken Time  BP 132/68 12/13/2018 12:03 PM  Temp 36.4 C 12/13/2018 12:03 PM  Pulse 71 12/13/2018 12:03 PM  Resp 17 12/13/2018 12:05 PM  SpO2 98 % 12/13/2018 12:03 PM  Vitals shown include unvalidated device data.  Last Pain:  Vitals:   12/13/18 1203  PainSc: 0-No pain         Complications: No apparent anesthesia complications

## 2018-12-13 NOTE — Op Note (Signed)
Date Dec 13, 2026  time 12:01 PM   PATIENT:  Stephen Leon   PRE-OPERATIVE DIAGNOSIS:  closed wedge compression fracture of T7   POST-OPERATIVE DIAGNOSIS:  closed wedge compression fracture of T7   PROCEDURE:  Procedure(s): KYPHOPLASTY T7  SURGEON: Laurene Footman, MD   ASSISTANTS: None   ANESTHESIA:   local and MAC   EBL:  No intake/output data recorded.   BLOOD ADMINISTERED:none   DRAINS: none    LOCAL MEDICATIONS USED:  MARCAINE    and XYLOCAINE    SPECIMEN:   T7 vertebral body   DISPOSITION OF SPECIMEN:  Pathology   COUNTS:  YES   TOURNIQUET:  * No tourniquets in log *   IMPLANTS: Bone cement   DICTATION: .Dragon Dictation  patient was brought to the operating room and after adequate anesthesia was obtained the patient was placed prone.  C arm was brought in in good visualization of the affected level obtained on both AP and lateral projections.  After patient identification and timeout procedures were completed, local anesthetic was infiltrated with 10 cc 1% Xylocaine infiltrated subcutaneously.  This is done the area on the right side of the planned approach.  The back was then prepped and draped in the usual sterile manner and repeat timeout procedure carried out.  A spinal needle was brought down to the pedicle on the right side of  T7 and a 50-50 mix of 1% Xylocaine half percent Sensorcaine with epinephrine total of 20 cc injected.  After allowing this to set a small incision was made and the trocar was advanced into the vertebral body in an extrapedicular fashion.  Biopsy was obtained Drilling was carried out balloon inserted with inflation to  to cc.  When the cement was appropriate consistency for cc were injected into the vertebral body without extravasation, good fill superior to inferior endplates and from right to left sides along the inferior endplate.  After the cement had set the trochar was removed and permanent C-arm views obtained.  The wound was closed with  Dermabond followed by Band-Aid   PLAN OF CARE: Discharge to home after PACU   PATIENT DISPOSITION:  PACU - hemodynamically stable.

## 2018-12-13 NOTE — Discharge Instructions (Signed)
°  Remove bandaids on Saturday and may shower.  Take it easy today and tomorrow.  Resume regular activities on Saturday slowly.  AMBULATORY SURGERY  DISCHARGE INSTRUCTIONS   1) The drugs that you were given will stay in your system until tomorrow so for the next 24 hours you should not:  A) Drive an automobile B) Make any legal decisions C) Drink any alcoholic beverage   2) You may resume regular meals tomorrow.  Today it is better to start with liquids and gradually work up to solid foods.  You may eat anything you prefer, but it is better to start with liquids, then soup and crackers, and gradually work up to solid foods.   3) Please notify your doctor immediately if you have any unusual bleeding, trouble breathing, redness and pain at the surgery site, drainage, fever, or pain not relieved by medication.    4) Additional Instructions:  Please contact your physician with any problems or Same Day Surgery at [phone], Monday through Friday 6 am to 4 pm, or [place] [facility] number at [phone].

## 2018-12-13 NOTE — Anesthesia Postprocedure Evaluation (Signed)
Anesthesia Post Note  Patient: Stephen Leon  Procedure(s) Performed: KYPHOPLASTY- T7 (N/A Back)  Patient location during evaluation: PACU Anesthesia Type: General Level of consciousness: awake and alert and oriented Pain management: pain level controlled Vital Signs Assessment: post-procedure vital signs reviewed and stable Respiratory status: spontaneous breathing, nonlabored ventilation and respiratory function stable Cardiovascular status: blood pressure returned to baseline and stable Postop Assessment: no signs of nausea or vomiting Anesthetic complications: no     Last Vitals:  Vitals:   12/13/18 1241 12/13/18 1254  BP: (!) 163/90 (!) 168/86  Pulse: 62 65  Resp: 11   Temp: 36.8 C (!) 36.2 C  SpO2: 95% 95%    Last Pain:  Vitals:   12/13/18 1254  TempSrc: Temporal  PainSc: 0-No pain                 Xabi Wittler

## 2018-12-13 NOTE — Anesthesia Preprocedure Evaluation (Signed)
Anesthesia Evaluation  Patient identified by MRN, date of birth, ID band Patient awake    Reviewed: Allergy & Precautions, NPO status , Patient's Chart, lab work & pertinent test results  History of Anesthesia Complications Negative for: history of anesthetic complications  Airway Mallampati: II  TM Distance: >3 FB Neck ROM: Full    Dental no notable dental hx.    Pulmonary neg sleep apnea, COPD,  COPD inhaler, Current Smoker,    breath sounds clear to auscultation- rhonchi (-) wheezing      Cardiovascular hypertension, Pt. on medications (-) CAD, (-) Past MI, (-) Cardiac Stents and (-) CABG  Rhythm:Regular Rate:Normal - Systolic murmurs and - Diastolic murmurs    Neuro/Psych neg Seizures PSYCHIATRIC DISORDERS Anxiety    GI/Hepatic negative GI ROS, Neg liver ROS,   Endo/Other  negative endocrine ROSneg diabetes  Renal/GU negative Renal ROS     Musculoskeletal negative musculoskeletal ROS (+)   Abdominal (+) - obese,   Peds  Hematology negative hematology ROS (+)   Anesthesia Other Findings Past Medical History: 2016: Cancer ([place]) No date: COPD (chronic obstructive pulmonary disease) ([place]) 07/13/2007: Cough     Comment:  Qualifier: Diagnosis of  By: Wynona Luna  No date: Hypertension 07/13/2007: LUNG NODULE     Comment:  Qualifier: Diagnosis of  By: Wynona Luna    Reproductive/Obstetrics                             Anesthesia Physical Anesthesia Plan  ASA: II  Anesthesia Plan: General   Post-op Pain Management:    Induction: Intravenous  PONV Risk Score and Plan: 0 and Propofol infusion  Airway Management Planned: Natural Airway  Additional Equipment:   Intra-op Plan:   Post-operative Plan:   Informed Consent: I have reviewed the patients History and Physical, chart, labs and discussed the procedure including the risks, benefits and alternatives for the  proposed anesthesia with the patient or authorized representative who has indicated his/her understanding and acceptance.     Dental advisory given  Plan Discussed with: CRNA and Anesthesiologist  Anesthesia Plan Comments:         Anesthesia Quick Evaluation

## 2018-12-13 NOTE — H&P (Signed)
Reviewed paper H+P, will be scanned into chart. No changes noted.  

## 2018-12-15 LAB — CULTURE, BLOOD (ROUTINE X 2)
Culture: NO GROWTH
Culture: NO GROWTH
Special Requests: ADEQUATE
Special Requests: ADEQUATE

## 2018-12-17 LAB — SURGICAL PATHOLOGY

## 2018-12-26 DIAGNOSIS — S22060D Wedge compression fracture of T7-T8 vertebra, subsequent encounter for fracture with routine healing: Secondary | ICD-10-CM | POA: Diagnosis not present

## 2019-01-09 DIAGNOSIS — S3992XA Unspecified injury of lower back, initial encounter: Secondary | ICD-10-CM | POA: Diagnosis not present

## 2019-01-09 DIAGNOSIS — S22050D Wedge compression fracture of T5-T6 vertebra, subsequent encounter for fracture with routine healing: Secondary | ICD-10-CM | POA: Diagnosis not present

## 2019-01-10 ENCOUNTER — Ambulatory Visit
Admission: RE | Admit: 2019-01-10 | Discharge: 2019-01-10 | Disposition: A | Discharge status: Home / Self Care | Payer: Medicare HMO | Source: Ambulatory Visit | Attending: Orthopedic Surgery | Admitting: Orthopedic Surgery

## 2019-01-10 ENCOUNTER — Other Ambulatory Visit: Payer: Self-pay | Admitting: Orthopedic Surgery

## 2019-01-10 ENCOUNTER — Other Ambulatory Visit: Payer: Self-pay

## 2019-01-10 DIAGNOSIS — S22050A Wedge compression fracture of T5-T6 vertebra, initial encounter for closed fracture: Secondary | ICD-10-CM | POA: Insufficient documentation

## 2019-01-10 DIAGNOSIS — S22060A Wedge compression fracture of T7-T8 vertebra, initial encounter for closed fracture: Secondary | ICD-10-CM | POA: Diagnosis not present

## 2019-01-10 DIAGNOSIS — Z9889 Other specified postprocedural states: Secondary | ICD-10-CM

## 2019-01-21 ENCOUNTER — Ambulatory Visit: Payer: Medicare HMO

## 2019-06-05 DIAGNOSIS — R0602 Shortness of breath: Secondary | ICD-10-CM | POA: Diagnosis not present

## 2019-06-05 DIAGNOSIS — R05 Cough: Secondary | ICD-10-CM | POA: Diagnosis not present

## 2019-06-05 DIAGNOSIS — J432 Centrilobular emphysema: Secondary | ICD-10-CM | POA: Diagnosis not present

## 2019-06-17 DIAGNOSIS — K4090 Unilateral inguinal hernia, without obstruction or gangrene, not specified as recurrent: Secondary | ICD-10-CM | POA: Diagnosis not present

## 2019-06-17 DIAGNOSIS — F17210 Nicotine dependence, cigarettes, uncomplicated: Secondary | ICD-10-CM | POA: Diagnosis not present

## 2019-06-17 DIAGNOSIS — J418 Mixed simple and mucopurulent chronic bronchitis: Secondary | ICD-10-CM | POA: Diagnosis not present

## 2019-06-17 DIAGNOSIS — R05 Cough: Secondary | ICD-10-CM | POA: Diagnosis not present

## 2019-06-17 DIAGNOSIS — R0602 Shortness of breath: Secondary | ICD-10-CM | POA: Diagnosis not present

## 2019-06-25 DIAGNOSIS — K4090 Unilateral inguinal hernia, without obstruction or gangrene, not specified as recurrent: Secondary | ICD-10-CM | POA: Diagnosis not present

## 2019-06-25 DIAGNOSIS — J418 Mixed simple and mucopurulent chronic bronchitis: Secondary | ICD-10-CM | POA: Diagnosis not present

## 2019-06-25 DIAGNOSIS — Z720 Tobacco use: Secondary | ICD-10-CM | POA: Diagnosis not present

## 2019-06-28 ENCOUNTER — Ambulatory Visit: Payer: Self-pay | Admitting: Surgery

## 2019-06-28 NOTE — H&P (Signed)
Subjective:   CC: Non-recurrent unilateral inguinal hernia without obstruction or gangrene [K40.90]  HPI:  Stephen Leon is a 73 y.o. male who was referred by Donnamarie Rossetti, * for evaluation of above. Symptoms were first noted 2 months ago. Pain is sharp, dull, achy and intermittent, confined to the right groin, without radiation.  Associated with nothing specific, exacerbated by lifting  Lump is reducible. Patient has no symptoms of  chronic constipation.    Past Medical History:  has a past medical history of Cancer (CMS-HCC) (2013), Compression fracture of body of thoracic vertebra (CMS-HCC), and COPD (chronic obstructive pulmonary disease) (CMS-HCC).  Past Surgical History:       Past Surgical History:  Procedure Laterality Date  . HERNIA REPAIR    . pain clinic procedure  2017  . THORACOSCOPY WITH LOBECTOMY LUNG Right 2013   Right Upper Lobe    Family History: family history is not on file. He was adopted.  Social History:  reports that he has been smoking cigarettes. He has never used smokeless tobacco. He reports current alcohol use. He reports that he does not use drugs.  Current Medications: has a current medication list which includes the following prescription(s): albuterol, fexofenadine, fluticasone-umeclidinium-vilanterol, gabapentin, hydrocodone-acetaminophen, hydrocodone-homatropine, ibuprofen, prednisone, sertraline, tamsulosin, and ipratropium-albuterol.  Allergies:     Allergies as of 06/25/2019  . (No Active Allergies)    ROS:  A 15 point review of systems was performed and pertinent positives and negatives noted in HPI   Objective:   BP 144/84   Pulse 70   Ht 172.7 cm (5\' 8" )   Wt 66.7 kg (147 lb)   BMI 22.35 kg/m   Constitutional :  alert, appears stated age, cooperative and no distress  Lymphatics/Throat:  no asymmetry, masses, or scars  Respiratory:  clear to auscultation bilaterally  Cardiovascular:  regular rate  and rhythm  Gastrointestinal: soft, non-tender; bowel sounds normal; no masses,  no organomegaly. inguinal hernia noted.  small and reducible  Musculoskeletal: Steady gait and movement  Skin: Cool and moist, visible surgical scars   Psychiatric: Normal affect, non-agitated, not confused       LABS:  n/a   RADS: n/a Assessment:       Non-recurrent unilateral inguinal hernia without obstruction or gangrene [K40.90] Smoking COPD with steroid use Plan:   1. Non-recurrent unilateral inguinal hernia without obstruction or gangrene [K40.90]   Discussed the risk of surgery including recurrence, which can be up to 50% in the case of incisional or complex hernias, possible use of prosthetic materials (mesh) and the increased risk of mesh infxn if used, bleeding, chronic pain, post-op infxn, post-op SBO or ileus, and possible re-operation to address said risks. The risks of general anesthetic, if used, includes MI, CVA, sudden death or even reaction to anesthetic medications also discussed. Alternatives include continued observation.  Benefits include possible symptom relief, prevention of incarceration, strangulation, enlargement in size over time, and the risk of emergency surgery in the face of strangulation.   Typical post-op recovery time of 3-5 days with 2 weeks of activity restrictions were also discussed.  ED return precautions given for sudden increase in pain, size of hernia with accompanying fever, nausea, and/or vomiting.  The patient verbalized understanding and all questions were answered to the patient's satisfaction.   2. Patient has elected to proceed with surgical treatment. Procedure will be scheduled.  Written consent was obtained. NEED CLEARANCE FROM PULM for his chronic steroid use. Increased risk of perioperative complications discussed  due to steroids, COPD, and active smoking history.  Discussed importance of smoking cessation from surgery healing standpoint  as well.    RIGHT robotic assisted lap ing hernia repair

## 2019-06-28 NOTE — H&P (View-Only) (Signed)
Subjective:   CC: Non-recurrent unilateral inguinal hernia without obstruction or gangrene [K40.90]  HPI:  Stephen Leon is a 73 y.o. male who was referred by Donnamarie Rossetti, * for evaluation of above. Symptoms were first noted 2 months ago. Pain is sharp, dull, achy and intermittent, confined to the right groin, without radiation.  Associated with nothing specific, exacerbated by lifting  Lump is reducible. Patient has no symptoms of  chronic constipation.    Past Medical History:  has a past medical history of Cancer (CMS-HCC) (2013), Compression fracture of body of thoracic vertebra (CMS-HCC), and COPD (chronic obstructive pulmonary disease) (CMS-HCC).  Past Surgical History:       Past Surgical History:  Procedure Laterality Date  . HERNIA REPAIR    . pain clinic procedure  2017  . THORACOSCOPY WITH LOBECTOMY LUNG Right 2013   Right Upper Lobe    Family History: family history is not on file. He was adopted.  Social History:  reports that he has been smoking cigarettes. He has never used smokeless tobacco. He reports current alcohol use. He reports that he does not use drugs.  Current Medications: has a current medication list which includes the following prescription(s): albuterol, fexofenadine, fluticasone-umeclidinium-vilanterol, gabapentin, hydrocodone-acetaminophen, hydrocodone-homatropine, ibuprofen, prednisone, sertraline, tamsulosin, and ipratropium-albuterol.  Allergies:     Allergies as of 06/25/2019  . (No Active Allergies)    ROS:  A 15 point review of systems was performed and pertinent positives and negatives noted in HPI   Objective:   BP 144/84   Pulse 70   Ht 172.7 cm (5\' 8" )   Wt 66.7 kg (147 lb)   BMI 22.35 kg/m   Constitutional :  alert, appears stated age, cooperative and no distress  Lymphatics/Throat:  no asymmetry, masses, or scars  Respiratory:  clear to auscultation bilaterally  Cardiovascular:  regular rate  and rhythm  Gastrointestinal: soft, non-tender; bowel sounds normal; no masses,  no organomegaly. inguinal hernia noted.  small and reducible  Musculoskeletal: Steady gait and movement  Skin: Cool and moist, visible surgical scars   Psychiatric: Normal affect, non-agitated, not confused       LABS:  n/a   RADS: n/a Assessment:       Non-recurrent unilateral inguinal hernia without obstruction or gangrene [K40.90] Smoking COPD with steroid use Plan:   1. Non-recurrent unilateral inguinal hernia without obstruction or gangrene [K40.90]   Discussed the risk of surgery including recurrence, which can be up to 50% in the case of incisional or complex hernias, possible use of prosthetic materials (mesh) and the increased risk of mesh infxn if used, bleeding, chronic pain, post-op infxn, post-op SBO or ileus, and possible re-operation to address said risks. The risks of general anesthetic, if used, includes MI, CVA, sudden death or even reaction to anesthetic medications also discussed. Alternatives include continued observation.  Benefits include possible symptom relief, prevention of incarceration, strangulation, enlargement in size over time, and the risk of emergency surgery in the face of strangulation.   Typical post-op recovery time of 3-5 days with 2 weeks of activity restrictions were also discussed.  ED return precautions given for sudden increase in pain, size of hernia with accompanying fever, nausea, and/or vomiting.  The patient verbalized understanding and all questions were answered to the patient's satisfaction.   2. Patient has elected to proceed with surgical treatment. Procedure will be scheduled.  Written consent was obtained. NEED CLEARANCE FROM PULM for his chronic steroid use. Increased risk of perioperative complications discussed  due to steroids, COPD, and active smoking history.  Discussed importance of smoking cessation from surgery healing standpoint  as well.    RIGHT robotic assisted lap ing hernia repair

## 2019-07-10 ENCOUNTER — Encounter
Admission: RE | Admit: 2019-07-10 | Discharge: 2019-07-10 | Disposition: A | Discharge status: Home / Self Care | Payer: Medicare HMO | Source: Ambulatory Visit | Attending: Surgery | Admitting: Surgery

## 2019-07-10 ENCOUNTER — Other Ambulatory Visit: Payer: Self-pay

## 2019-07-10 DIAGNOSIS — R9431 Abnormal electrocardiogram [ECG] [EKG]: Secondary | ICD-10-CM | POA: Diagnosis not present

## 2019-07-10 DIAGNOSIS — Z01818 Encounter for other preprocedural examination: Secondary | ICD-10-CM | POA: Diagnosis not present

## 2019-07-10 DIAGNOSIS — J449 Chronic obstructive pulmonary disease, unspecified: Secondary | ICD-10-CM | POA: Diagnosis not present

## 2019-07-10 HISTORY — DX: Dyspnea, unspecified: R06.00

## 2019-07-10 HISTORY — DX: Thoracic aortic aneurysm, without rupture: I71.2

## 2019-07-10 HISTORY — DX: Thoracic aortic aneurysm, without rupture, unspecified: I71.20

## 2019-07-10 HISTORY — DX: Anxiety disorder, unspecified: F41.9

## 2019-07-10 LAB — CBC
HCT: 43.3 % (ref 39.0–52.0)
Hemoglobin: 14.6 g/dL (ref 13.0–17.0)
MCH: 32.4 pg (ref 26.0–34.0)
MCHC: 33.7 g/dL (ref 30.0–36.0)
MCV: 96 fL (ref 80.0–100.0)
Platelets: 145 10*3/uL — ABNORMAL LOW (ref 150–400)
RBC: 4.51 MIL/uL (ref 4.22–5.81)
RDW: 14.7 % (ref 11.5–15.5)
WBC: 7.3 10*3/uL (ref 4.0–10.5)
nRBC: 0 % (ref 0.0–0.2)

## 2019-07-10 LAB — BASIC METABOLIC PANEL
Anion gap: 9 (ref 5–15)
BUN: 15 mg/dL (ref 8–23)
CO2: 27 mmol/L (ref 22–32)
Calcium: 9 mg/dL (ref 8.9–10.3)
Chloride: 103 mmol/L (ref 98–111)
Creatinine, Ser: 0.7 mg/dL (ref 0.61–1.24)
GFR calc Af Amer: >60 mL/min (ref >60)
GFR calc non Af Amer: >60 mL/min (ref >60)
Glucose, Bld: 105 mg/dL — ABNORMAL HIGH (ref 70–99)
Potassium: 4 mmol/L (ref 3.5–5.1)
Sodium: 139 mmol/L (ref 135–145)

## 2019-07-10 NOTE — Patient Instructions (Signed)
Your procedure is scheduled on: 07-19-19 FRIDAY Report to Same Day Surgery 2nd floor medical mall [facility] Entrance-take elevator on left to 2nd floor.  Check in with surgery information desk.) To find out your arrival time please call [phone] between 1PM - 3PM on 07-18-19 THURSDAY  Remember: Instructions that are not followed completely may result in serious medical risk, up to and including death, or upon the discretion of your surgeon and anesthesiologist your surgery may need to be rescheduled.    _x___ 1. Do not eat food after midnight the night before your procedure. NO GUM OR CANDY AFTER MIDNIGHT. You may drink clear liquids up to 2 hours before you are scheduled to arrive at the hospital for your procedure.  Do not drink clear liquids within 2 hours of your scheduled arrival to the hospital.  Clear liquids include  --Water or Apple juice without pulp  --atorade  --Black Coffee or Clear Tea (No milk, no creamers, do not add anything to the coffee or Tea   ____Ensure clear carbohydrate drink on the way to the hospital for bariatric patients  ____Ensure clear carbohydrate drink 3 hours before surgery.    __x__ 2. No Alcohol for 24 hours before or after surgery.   __x__3. No Smoking or e-cigarettes for 24 prior to surgery.  Do not use any chewable tobacco products for at least 6 hour prior to surgery   ____  4. Bring all medications with you on the day of surgery if instructed.    __x__ 5. Notify your doctor if there is any change in your medical condition     (cold, fever, infections).    x___6. On the morning of surgery brush your teeth with toothpaste and water.  You may rinse your mouth with mouth wash if you wish.  Do not swallow any toothpaste or mouthwash.   Do not wear jewelry, make-up, hairpins, clips or nail polish.  Do not wear lotions, powders, or perfumes.  Do not shave 48 hours prior to surgery. Men may shave face and neck.  Do not bring valuables to  the hospital.    [facility] is not responsible for any belongings or valuables.               Contacts, dentures or bridgework may not be worn into surgery.  Leave your suitcase in the car. After surgery it may be brought to your room.  For patients admitted to the hospital, discharge time is determined by your treatment team.  _  Patients discharged the day of surgery will not be allowed to drive home.  You will need someone to drive you home and stay with you the night of your procedure.    Please read over the following fact sheets that you were given:   [facility] Preparing for Surgery   _x___ TAKE THE FOLLOWING MEDICATION THE MORNING OF SURGERY WITH A SMALL SIP OF WATER. These include:  1. GABAPENTIN (NEURONTIN)  2.  3.  4.  5.  6.  ____Fleets enema or Magnesium Citrate as directed.   _x___ Use CHG Soap or sage wipes as directed on instruction sheet   _X___ Use inhalers on the day of surgery and bring to hospital day of surgery-USE YOUR VENTOLIN INHALER AM OF SURGERY AND [place]  ____ Stop Metformin and Janumet 2 days prior to surgery.    ____ Take 1/2 of usual insulin dose the night before surgery and none on the morning surgery.  ____ Follow recommendations from Cardiologist, Pulmonologist or PCP regarding stopping Aspirin, Coumadin, Plavix ,Eliquis, Effient, or Pradaxa, and Pletal.  X____Stop Anti-inflammatories such as Advil, Aleve, Ibuprofen, Motrin, Naproxen, Naprosyn, Goodies powders or aspirin products  7 DAYS PRIOR TO SURGERY-OK to take Tylenol   ____ Stop supplements until after surgery   ____ Bring C-Pap to the hospital.

## 2019-07-10 NOTE — Pre-Procedure Instructions (Signed)
Progress Notes - documented in this encounter Stephen Pian, Stephen Leon - 06/17/2019 9:15 AM EST Formatting of this note might be different from the original.  Shortness of Breath  History of Present Illness: Stephen Leon is a 73 y.o. male presents to clinic for recheck. Still smoking smoking, cough not much better. No fever, + slight headaches. No stiff neck. White sputum. No blood. No recent travel. CXR 10 days ago and today appears unchanged. Hx of asbestos exposure, Rll bronchiectasis. No hemoptysis. No pleurisy. No edema or calf pain, no ectopy or syncope. Advised again to stop smoking.   Current Medications:  Current Outpatient Medications  Medication Sig Dispense Refill  . albuterol 90 mcg/actuation inhaler Inhale 2 inhalations into the lungs every 6 (six) hours as needed for Wheezing. 1 Inhaler 5  . fexofenadine (ALLEGRA) 180 MG tablet Take 1 tablet (180 mg total) by mouth once daily 30 tablet 11  . fluticasone-umeclidin-vilanter (TRELEGY ELLIPTA) 100-62.5-25 mcg DsDv Inhale 1 inhalation into the lungs once daily 1 Disk 5  . gabapentin (NEURONTIN) 800 MG tablet TAKE 1 TABLET THREE TIMES DAILY 270 tablet 1  . HYDROcodone-acetaminophen (NORCO) 5-325 mg tablet Take 1 tablet by mouth every 6 (six) hours as needed for Pain Take 1 tablet by mouth every 4 (four) hours as needed for moderate pain. 30 tablet 0  . ibuprofen (ADVIL,MOTRIN) 200 MG tablet Take 200 mg by mouth every 6 (six) hours as needed for Pain.  Marland Kitchen ipratropium-albuterol (DUO-NEB) nebulizer solution Take 3 mLs by nebulization 4 (four) times daily 360 mL 11  . sertraline (ZOLOFT) 50 MG tablet Take 1 tablet (50 mg total) by mouth once daily 30 tablet 5  . tamsulosin (FLOMAX) 0.4 mg capsule TAKE 1 CAPSULE EVERY DAY 30 MINUTES AFTER THE SAME MEAL EACH DAY 90 capsule 1  . UNABLE TO FIND as needed Paracetamol (OTC codeine product from [place])   No current facility-administered medications for this visit.   Problem List:   Patient Active Problem List  Diagnosis  . Cancer (CMS-HCC)  . COPD (chronic obstructive pulmonary disease) (CMS-HCC)  . Neuropathic pain of chest  . Essential hypertension  . Acute bronchitis with bronchospasm, unspecified  . GAD (generalized anxiety disorder)  . Tobacco user   History: Past Medical History:  Diagnosis Date  . 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 Compression fracture of body of thoracic vertebra (CMS-HCC)  . COPD (chronic obstructive pulmonary disease) (CMS-HCC)   Past Surgical History:  Procedure Laterality Date  . HERNIA REPAIR  . pain clinic procedure 2017  . THORACOSCOPY WITH LOBECTOMY LUNG Right 2013  Right Upper Lobe   Family History  Adopted: Yes   Social History   Socioeconomic History  . Marital status: Married  Spouse name: Not on file  . Number of children: Not on file  . Years of education: Not on file  . Highest education level: Not on file  Occupational History  . Not on file  Social Needs  . Financial resource strain: Not on file  . Food insecurity  Worry: Not on file  Inability: Not on file  . Transportation needs  Medical: Not on file  Non-medical: Not on file  Tobacco Use  . Smoking status: Current Every Day Smoker  Types: Cigarettes  Last attempt to quit: 04/08/2012  Years since quitting: 7.1  . Smokeless tobacco: Never Used  . Tobacco comment: started smoking again occasionally ~ 2017  Substance and Sexual Activity  . Alcohol use: Yes  Alcohol/week: 0.0  standard drinks  . Drug use: Never  . Sexual activity: Defer  Other Topics Concern  . Not on file  Social History Narrative  . Not on file   Allergies:  Codeine  Review of Systems: As per above. Pretty much unchanged with the exception that his breathing is improved. No associated cardiopulmonary, GI, GU, dermatological symptoms today. No focal neurological symptoms or psychological changes.   Physical Exam: BP 139/79  Pulse 78  Temp 36.5 C (97.7 F) (Oral)  Wt  66.7 kg (147 lb)  SpO2 94%  BMI 22.35 kg/m 66.7 kg (147 lb) 94% General: NAD. Able to speak in complete sentences without cough or dyspnea HEENT: Normocephalic, nontraumatic. Extraocular movements intact NECK: Supple. No JVD, nodes, thryomegaly CV: RRR no murmurs, gallops, rubs PULM: Normal respiratory effort, Clear to auscultation bilaterally without wheezing or crackles Abd: Soft, non tender EXTREMITIES: No significant edema, cyanosis or Homans'signs SKIN: Fair turgor. No rashes LYMPHATIC: No nodes NEURO: No gross deficits PSYCH: Appropriate affect, alert, oriented   Impression: Here with a more cough, (acute on chronic) and wheezing. No blood, + pnd, no gerd, no ace inhibitors.He has emphysema, rll bronchiectasis, asbestos exposure. 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

## 2019-07-10 NOTE — Pre-Procedure Instructions (Signed)
Schnier, Dolores Lory, MD  Physician  Vascular Surgery  Progress Notes  Signed  Encounter Date:  09/21/2017          Signed      Expand All Collapse All    Show:Clear all [x] Manual[x] Template[] Copied  Added by: [x] Schnier, Dolores Lory, MD  [] Hover for details    MRN : [id number]  Stephen Leon is a 73 y.o. (July 27, 1946) male who presents with chief complaint of      Chief Complaint  Patient presents with  . New Patient (Initial Visit)    ref Raul Del for Thoracic Aneurysm  .  History of Present Illness: The patient presents to the office for evaluation of an thoracic aortic aneurysm. The aneurysm was found incidentally by CT scan. Patient denies chest abdominal pain or unusual back pain, no other abdominal complaints.  No history of an acute onset of painful blue discoloration of the toes.     No family history of TAA/AAA.   Patient denies amaurosis fugax or TIA symptoms. There is no history of claudication or rest pain symptoms of the lower extremities.  The patient denies angina or shortness of breath.  CT scan shows an TAA that measures 4.10 cm  Active Medications      Current Meds  Medication Sig  . albuterol (PROVENTIL HFA;VENTOLIN HFA) 108 (90 Base) MCG/ACT inhaler Inhale 2 puffs into the lungs every 6 (six) hours as needed for wheezing or shortness of breath.  . clarithromycin (BIAXIN) 500 MG tablet   . fluticasone furoate-vilanterol (BREO ELLIPTA) 100-25 MCG/INH AEPB Inhale 1 puff into the lungs daily.   Marland Kitchen gabapentin (NEURONTIN) 400 MG capsule   . ibuprofen (ADVIL,MOTRIN) 200 MG tablet Take 400 mg by mouth 2 (two) times daily.          Past Medical History:  Diagnosis Date  . Cancer ([place])   . Cough 07/13/2007   Qualifier: Diagnosis of  By: Wynona Luna   . Hypertension   . 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 HERNIA REPAIR       Social History Social History        Tobacco Use  . Smoking status: Current Some Day Smoker    Packs/day: 0.25    Types: Cigarettes  . Smokeless tobacco: Never Used  Substance Use Topics  . Alcohol use: Yes    Alcohol/week: 7.2 oz    Types: 12 Standard drinks or equivalent per week    Comment: Drinks 3-4 beers every night  . Drug use: No    Family History      Family History  Problem Relation Age of Onset  . COPD Mother   No family history of bleeding/clotting disorders, porphyria or autoimmune disease       Allergies  Allergen Reactions  . Codeine Nausea And Vomiting     REVIEW OF SYSTEMS (Negative unless checked)  Constitutional: [] ?Weight loss  [] ?Fever  [] ?Chills Cardiac: [] ?Chest pain   [] ?Chest pressure   [] ?Palpitations   [] ?Shortness of breath when laying flat   [x] ?Shortness of breath with exertion. Vascular:  [] ?Pain in legs with walking   [] ?Pain in legs at rest  [] ?History of DVT   [] ?Phlebitis   [] ?Swelling in legs   [] ?Varicose veins   [] ?Non-healing ulcers Pulmonary:   [] ?Uses home oxygen   [] ?Productive cough   [] ?Hemoptysis   [] ?  Wheeze  [x] ?COPD   [] ?Asthma Neurologic:  [] ?Dizziness   [] ?Seizures   [] ?History of stroke   [] ?History of TIA  [] ?Aphasia   [] ?Vissual changes   [] ?Weakness or numbness in arm   [] ?Weakness or numbness in leg Musculoskeletal:   [] ?Joint swelling   [] ?Joint pain   [] ?Low back pain Hematologic:  [] ?Easy bruising  [] ?Easy bleeding   [] ?Hypercoagulable state   [] ?Anemic Gastrointestinal:  [] ?Diarrhea   [] ?Vomiting  [] ?Gastroesophageal reflux/heartburn   [] ?Difficulty swallowing. Genitourinary:  [] ?Chronic kidney disease   [] ?Difficult urination  [] ?Frequent urination   [] ?Blood in urine Skin:  [] ?Rashes   [] ?Ulcers  Psychological:  [] ?History of anxiety   [] ? History of major depression.  Physical Examination     Vitals:   09/21/17 1038  BP: 134/84  Pulse: 75  Resp: 16  Weight: 149 lb 6.4 oz  (67.8 kg)  Height: 5\' 7"  (1.702 m)   Body mass index is 23.4 kg/m. Gen: WD/WN, NAD Head: [place]/AT, No temporalis wasting.  Ear/Nose/Throat: Hearing grossly intact, nares w/o erythema or drainage, poor dentition Eyes: PER, EOMI, sclera nonicteric.  Neck: Supple, no masses.  No bruit or JVD.  Pulmonary:  Good air movement, clear to auscultation bilaterally, no use of accessory muscles.  Cardiac: RRR, normal S1, S2, no Murmurs. Vascular:  Popliteal arteries not enlarged Vessel Right Left  Radial Palpable Palpable  Ulnar Palpable Palpable  Brachial Palpable Palpable  Carotid Palpable Palpable  Femoral Palpable Palpable  Popliteal Palpable Palpable  PT Palpable Palpable  DP Palpable Palpable   Gastrointestinal: soft, non-distended. No guarding/no peritoneal signs.  Musculoskeletal: M/S 5/5 throughout.  No deformity or atrophy.  Neurologic: CN 2-12 intact. Pain and light touch intact in extremities.  Symmetrical.  Speech is fluent. Motor exam as listed above. Psychiatric: Judgment intact, Mood & affect appropriate for pt's clinical situation. Dermatologic: No rashes or ulcers noted.  No changes consistent with cellulitis. Lymph : No Cervical lymphadenopathy, no lichenification or skin changes of chronic lymphedema.  CBC Recent Labs       Lab Results  Component Value Date   WBC 8.6 09/06/2016   HGB 17.3 (H) 09/06/2016   HCT 51.0 09/06/2016   MCV 97.2 09/06/2016   PLT 213 09/06/2016      BMET Labs (Brief)          Component Value Date/Time   NA 136 09/06/2016 1252   K 4.2 09/06/2016 1252   CL 99 (L) 09/06/2016 1252   CO2 30 07/06/2007 0736   GLUCOSE 92 09/06/2016 1252   BUN 15 09/06/2016 1252   CREATININE 0.80 09/06/2016 1252   CALCIUM 9.2 07/06/2007 0736   GFRNONAA 105 07/06/2007 0736   GFRAA 127 07/06/2007 0736     CrCl cannot be calculated (Patient's most recent lab result is older than the maximum 21 days allowed.).  COAG Recent Labs  No  results found for: INR, PROTIME    Radiology Imaging Results  No results found.     Assessment/Plan 1. Thoracic aortic aneurysm without rupture ([place]) No surgery or intervention at this time. The patient has an asymptomatic thoracic aortic aneurysm that is  4.1 cm in maximal diameter.  I have discussed the natural history of  aortic aneurysm and the small risk of rupture for aneurysm less than 5 cm in size.  However, as these small aneurysms tend to enlarge over time, continued surveillance with  CT scan is mandatory.  I have also discussed optimizing medical management with hypertension and lipid control and the  importance of abstinence from tobacco.  The patient is also encouraged to exercise a minimum of 30 minutes 4 times a week.  Should the patient develop new onset abdominal or back pain or signs of peripheral embolization they are instructed to seek medical attention immediately and to alert the physician providing care that they have an aneurysm.  The patient voices their understanding. The patient will return in 12 months for review.  Given his pulmonary disease he is likely to have several CT scans a year and I will use these to follow his thoracic aneurysm.  I have explained this to him in detail.  Should he return next year and not have a recent CT scan and one will be ordered and he understands that.  This is an attempt to avoid excessive x-ray exposure.  A CT scan of the abdomen and pelvis dated 08/24/2015 just 2 years ago demonstrated a normal abdominal aorta no evidence of aortic or iliac aneurysmal deterioration.  2. Essential (primary) hypertension Continue antihypertensive medications as already ordered, these medications have been reviewed and there are no changes at this time.   3. Chronic obstructive pulmonary disease, unspecified COPD type ([place]) Continue pulmonary medications and aerosols as already ordered, these medications have been reviewed and there are no  changes at this time.    4. GAD (generalized anxiety disorder) Continue antianxiety medications as already ordered, these medications have been reviewed and there are no changes at this time.     Hortencia Pilar, MD  09/23/2017 12:02 PM          Electronically signed by Katha Cabal, MD at 09/23/2017 12:08 PM   Office Visit on 09/21/2017     Detailed Report

## 2019-07-16 ENCOUNTER — Other Ambulatory Visit
Admission: RE | Admit: 2019-07-16 | Discharge: 2019-07-16 | Disposition: A | Discharge status: Home / Self Care | Payer: Medicare HMO | Source: Ambulatory Visit | Attending: Surgery | Admitting: Surgery

## 2019-07-16 DIAGNOSIS — Z20828 Contact with and (suspected) exposure to other viral communicable diseases: Secondary | ICD-10-CM | POA: Diagnosis not present

## 2019-07-16 DIAGNOSIS — Z01812 Encounter for preprocedural laboratory examination: Secondary | ICD-10-CM | POA: Diagnosis not present

## 2019-07-16 LAB — SARS CORONAVIRUS 2 (TAT 6-24 HRS): SARS Coronavirus 2: NEGATIVE

## 2019-07-17 ENCOUNTER — Encounter: Payer: Self-pay | Admitting: *Deleted

## 2019-07-17 NOTE — Pre-Procedure Instructions (Signed)
Progress Notes - documented in this encounter Erby Pian, MD - 06/17/2019 9:15 AM EST Formatting of this note might be different from the original.  Shortness of Breath  History of Present Illness: Stephen Leon is a 73 y.o. male presents to clinic for recheck. Still smoking smoking, cough not much better. No fever, + slight headaches. No stiff neck. White sputum. No blood. No recent travel. CXR 10 days ago and today appears unchanged. Hx of asbestos exposure, Rll bronchiectasis. No hemoptysis. No pleurisy. No edema or calf pain, no ectopy or syncope. Advised again to stop smoking.   Current Medications:  Current Outpatient Medications  Medication Sig Dispense Refill  . albuterol 90 mcg/actuation inhaler Inhale 2 inhalations into the lungs every 6 (six) hours as needed for Wheezing. 1 Inhaler 5  . fexofenadine (ALLEGRA) 180 MG tablet Take 1 tablet (180 mg total) by mouth once daily 30 tablet 11  . fluticasone-umeclidin-vilanter (TRELEGY ELLIPTA) 100-62.5-25 mcg DsDv Inhale 1 inhalation into the lungs once daily 1 Disk 5  . gabapentin (NEURONTIN) 800 MG tablet TAKE 1 TABLET THREE TIMES DAILY 270 tablet 1  . HYDROcodone-acetaminophen (NORCO) 5-325 mg tablet Take 1 tablet by mouth every 6 (six) hours as needed for Pain Take 1 tablet by mouth every 4 (four) hours as needed for moderate pain. 30 tablet 0  . ibuprofen (ADVIL,MOTRIN) 200 MG tablet Take 200 mg by mouth every 6 (six) hours as needed for Pain.  Marland Kitchen ipratropium-albuterol (DUO-NEB) nebulizer solution Take 3 mLs by nebulization 4 (four) times daily 360 mL 11  . sertraline (ZOLOFT) 50 MG tablet Take 1 tablet (50 mg total) by mouth once daily 30 tablet 5  . tamsulosin (FLOMAX) 0.4 mg capsule TAKE 1 CAPSULE EVERY DAY 30 MINUTES AFTER THE SAME MEAL EACH DAY 90 capsule 1  . UNABLE TO FIND as needed Paracetamol (OTC codeine product from [place])   No current facility-administered medications for this visit.   Problem List:   Patient Active Problem List  Diagnosis  . Cancer (CMS-HCC)  . COPD (chronic obstructive pulmonary disease) (CMS-HCC)  . Neuropathic pain of chest  . Essential hypertension  . Acute bronchitis with bronchospasm, unspecified  . GAD (generalized anxiety disorder)  . Tobacco user   History: Past Medical History:  Diagnosis Date  . 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 Compression fracture of body of thoracic vertebra (CMS-HCC)  . COPD (chronic obstructive pulmonary disease) (CMS-HCC)   Past Surgical History:  Procedure Laterality Date  . HERNIA REPAIR  . pain clinic procedure 2017  . THORACOSCOPY WITH LOBECTOMY LUNG Right 2013  Right Upper Lobe   Family History  Adopted: Yes   Social History   Socioeconomic History  . Marital status: Married  Spouse name: Not on file  . Number of children: Not on file  . Years of education: Not on file  . Highest education level: Not on file  Occupational History  . Not on file  Social Needs  . Financial resource strain: Not on file  . Food insecurity  Worry: Not on file  Inability: Not on file  . Transportation needs  Medical: Not on file  Non-medical: Not on file  Tobacco Use  . Smoking status: Current Every Day Smoker  Types: Cigarettes  Last attempt to quit: 04/08/2012  Years since quitting: 7.1  . Smokeless tobacco: Never Used  . Tobacco comment: started smoking again occasionally ~ 2017  Substance and Sexual Activity  . Alcohol use: Yes  Alcohol/week: 0.0  standard drinks  . Drug use: Never  . Sexual activity: Defer  Other Topics Concern  . Not on file  Social History Narrative  . Not on file   Allergies:  Codeine  Review of Systems: As per above. Pretty much unchanged with the exception that his breathing is improved. No associated cardiopulmonary, GI, GU, dermatological symptoms today. No focal neurological symptoms or psychological changes.   Physical Exam: BP 139/79  Pulse 78  Temp 36.5 C (97.7 F) (Oral)  Wt  66.7 kg (147 lb)  SpO2 94%  BMI 22.35 kg/m 66.7 kg (147 lb) 94% General: NAD. Able to speak in complete sentences without cough or dyspnea HEENT: Normocephalic, nontraumatic. Extraocular movements intact NECK: Supple. No JVD, nodes, thryomegaly CV: RRR no murmurs, gallops, rubs PULM: Normal respiratory effort, Clear to auscultation bilaterally without wheezing or crackles Abd: Soft, non tender EXTREMITIES: No significant edema, cyanosis or Homans'signs SKIN: Fair turgor. No rashes LYMPHATIC: No nodes NEURO: No gross deficits PSYCH: Appropriate affect, alert, oriented   Impression: Here with a more cough, (acute on chronic) and wheezing. No blood, + pnd, no gerd, no ace inhibitors.He has emphysema, rll bronchiectasis, asbestos exposure. 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

## 2019-07-19 ENCOUNTER — Ambulatory Visit: Payer: Medicare HMO | Admitting: Anesthesiology

## 2019-07-19 ENCOUNTER — Other Ambulatory Visit: Payer: Self-pay

## 2019-07-19 ENCOUNTER — Encounter: Payer: Self-pay | Admitting: Surgery

## 2019-07-19 ENCOUNTER — Ambulatory Visit
Admission: RE | Admit: 2019-07-19 | Discharge: 2019-07-19 | Disposition: A | Discharge status: Home / Self Care | Payer: Medicare HMO | Attending: Surgery | Admitting: Surgery

## 2019-07-19 ENCOUNTER — Encounter
Admission: RE | Disposition: A | Discharge status: Home / Self Care | Payer: Self-pay | Source: Home / Self Care | Attending: Surgery

## 2019-07-19 DIAGNOSIS — I712 Thoracic aortic aneurysm, without rupture: Secondary | ICD-10-CM | POA: Diagnosis not present

## 2019-07-19 DIAGNOSIS — F419 Anxiety disorder, unspecified: Secondary | ICD-10-CM | POA: Diagnosis not present

## 2019-07-19 DIAGNOSIS — J449 Chronic obstructive pulmonary disease, unspecified: Secondary | ICD-10-CM | POA: Diagnosis not present

## 2019-07-19 DIAGNOSIS — Z902 Acquired absence of lung [part of]: Secondary | ICD-10-CM | POA: Insufficient documentation

## 2019-07-19 DIAGNOSIS — F17210 Nicotine dependence, cigarettes, uncomplicated: Secondary | ICD-10-CM | POA: Diagnosis not present

## 2019-07-19 DIAGNOSIS — I10 Essential (primary) hypertension: Secondary | ICD-10-CM | POA: Diagnosis not present

## 2019-07-19 DIAGNOSIS — K4090 Unilateral inguinal hernia, without obstruction or gangrene, not specified as recurrent: Secondary | ICD-10-CM | POA: Diagnosis not present

## 2019-07-19 DIAGNOSIS — Z79899 Other long term (current) drug therapy: Secondary | ICD-10-CM | POA: Insufficient documentation

## 2019-07-19 DIAGNOSIS — Z859 Personal history of malignant neoplasm, unspecified: Secondary | ICD-10-CM | POA: Diagnosis not present

## 2019-07-19 HISTORY — DX: Bronchitis, not specified as acute or chronic: J40

## 2019-07-19 HISTORY — PX: XI ROBOTIC ASSISTED INGUINAL HERNIA REPAIR WITH MESH: SHX6706

## 2019-07-19 SURGERY — REPAIR, HERNIA, INGUINAL, ROBOT-ASSISTED, LAPAROSCOPIC, USING MESH
Anesthesia: General | Site: Abdomen | Laterality: Right

## 2019-07-19 MED ORDER — FENTANYL CITRATE (PF) 100 MCG/2ML IJ SOLN
INTRAMUSCULAR | Status: AC
  Administered 2019-07-19: 10:00:00 25 ug via INTRAVENOUS
  Filled 2019-07-19: qty 2

## 2019-07-19 MED ORDER — CEFAZOLIN SODIUM-DEXTROSE 2-4 GM/100ML-% IV SOLN
INTRAVENOUS | Status: AC
  Filled 2019-07-19: qty 100

## 2019-07-19 MED ORDER — MEPERIDINE HCL 50 MG/ML IJ SOLN: 6.2500 mg | INTRAMUSCULAR | Status: DC | PRN

## 2019-07-19 MED ORDER — KETAMINE INJECTION FOR OPTIME (MG/KG/HR) DOCUMENTATION
INTRAVENOUS | Status: DC | PRN
  Administered 2019-07-19: 20 mg via INTRAVENOUS

## 2019-07-19 MED ORDER — ACETAMINOPHEN 325 MG PO TABS: 650.0000 mg | ORAL_TABLET | Freq: Three times a day (TID) | ORAL | 0 refills | Status: AC | PRN

## 2019-07-19 MED ORDER — SUGAMMADEX SODIUM 200 MG/2ML IV SOLN
INTRAVENOUS | Status: DC | PRN
  Administered 2019-07-19: 136 mg via INTRAVENOUS

## 2019-07-19 MED ORDER — IPRATROPIUM-ALBUTEROL 0.5-2.5 (3) MG/3ML IN SOLN
RESPIRATORY_TRACT | Status: AC
  Administered 2019-07-19: 10:00:00 3 mL via RESPIRATORY_TRACT
  Filled 2019-07-19: qty 3

## 2019-07-19 MED ORDER — PROMETHAZINE HCL 25 MG/ML IJ SOLN: 6.2500 mg | INTRAMUSCULAR | Status: DC | PRN

## 2019-07-19 MED ORDER — HYDROCODONE-ACETAMINOPHEN 5-325 MG PO TABS: 1.0000 | ORAL_TABLET | Freq: Once | ORAL | Status: AC

## 2019-07-19 MED ORDER — SUGAMMADEX SODIUM 200 MG/2ML IV SOLN
INTRAVENOUS | Status: AC
  Filled 2019-07-19: qty 2

## 2019-07-19 MED ORDER — ACETAMINOPHEN 500 MG PO TABS: 1000.0000 mg | ORAL_TABLET | ORAL | Status: AC

## 2019-07-19 MED ORDER — BUPIVACAINE-EPINEPHRINE (PF) 0.5% -1:200000 IJ SOLN
INTRAMUSCULAR | Status: AC
  Filled 2019-07-19: qty 30

## 2019-07-19 MED ORDER — CHLORHEXIDINE GLUCONATE CLOTH 2 % EX PADS
6.0000 | MEDICATED_PAD | Freq: Once | CUTANEOUS | Status: AC
  Administered 2019-07-19: 6 via TOPICAL

## 2019-07-19 MED ORDER — VASOPRESSIN 20 UNIT/ML IV SOLN
INTRAVENOUS | Status: DC | PRN
  Administered 2019-07-19 (×3): 1 m[IU] via INTRAVENOUS

## 2019-07-19 MED ORDER — OXYCODONE HCL 5 MG PO TABS
ORAL_TABLET | ORAL | Status: AC
  Filled 2019-07-19: qty 1

## 2019-07-19 MED ORDER — FAMOTIDINE 20 MG PO TABS
ORAL_TABLET | ORAL | Status: AC
  Administered 2019-07-19: 20 mg via ORAL
  Filled 2019-07-19: qty 1

## 2019-07-19 MED ORDER — LIDOCAINE HCL (CARDIAC) PF 100 MG/5ML IV SOSY
PREFILLED_SYRINGE | INTRAVENOUS | Status: DC | PRN
  Administered 2019-07-19: 60 mg via INTRAVENOUS

## 2019-07-19 MED ORDER — HYDROCODONE-ACETAMINOPHEN 5-325 MG PO TABS
ORAL_TABLET | ORAL | Status: AC
  Administered 2019-07-19: 1 via ORAL
  Filled 2019-07-19: qty 1

## 2019-07-19 MED ORDER — BUPIVACAINE-EPINEPHRINE 0.5% -1:200000 IJ SOLN
INTRAMUSCULAR | Status: DC | PRN
  Administered 2019-07-19: 10 mL

## 2019-07-19 MED ORDER — KETAMINE HCL 50 MG/ML IJ SOLN
INTRAMUSCULAR | Status: AC
  Filled 2019-07-19: qty 10

## 2019-07-19 MED ORDER — PHENYLEPHRINE HCL (PRESSORS) 10 MG/ML IV SOLN
INTRAVENOUS | Status: DC | PRN
  Administered 2019-07-19: 100 ug via INTRAVENOUS
  Administered 2019-07-19: 200 ug via INTRAVENOUS
  Administered 2019-07-19 (×3): 100 ug via INTRAVENOUS
  Administered 2019-07-19: 200 ug via INTRAVENOUS

## 2019-07-19 MED ORDER — CEFAZOLIN SODIUM-DEXTROSE 2-4 GM/100ML-% IV SOLN
2.0000 g | INTRAVENOUS | Status: AC
  Administered 2019-07-19: 2 g via INTRAVENOUS

## 2019-07-19 MED ORDER — LACTATED RINGERS IV SOLN
INTRAVENOUS | Status: DC
  Administered 2019-07-19: 07:00:00 via INTRAVENOUS

## 2019-07-19 MED ORDER — OXYCODONE HCL 5 MG PO TABS: 5.0000 mg | ORAL_TABLET | Freq: Once | ORAL | Status: DC | PRN

## 2019-07-19 MED ORDER — LIDOCAINE HCL (PF) 2 % IJ SOLN
INTRAMUSCULAR | Status: AC
  Filled 2019-07-19: qty 10

## 2019-07-19 MED ORDER — ACETAMINOPHEN 500 MG PO TABS
ORAL_TABLET | ORAL | Status: AC
  Administered 2019-07-19: 06:00:00 1000 mg via ORAL
  Filled 2019-07-19: qty 2

## 2019-07-19 MED ORDER — HYDROCODONE-ACETAMINOPHEN 5-325 MG PO TABS: 1.0000 | ORAL_TABLET | Freq: Four times a day (QID) | ORAL | 0 refills | Status: DC | PRN

## 2019-07-19 MED ORDER — PROPOFOL 10 MG/ML IV BOLUS
INTRAVENOUS | Status: DC | PRN
  Administered 2019-07-19: 130 mg via INTRAVENOUS

## 2019-07-19 MED ORDER — IPRATROPIUM-ALBUTEROL 0.5-2.5 (3) MG/3ML IN SOLN: 3.0000 mL | Freq: Once | RESPIRATORY_TRACT | Status: AC

## 2019-07-19 MED ORDER — ROCURONIUM BROMIDE 100 MG/10ML IV SOLN
INTRAVENOUS | Status: DC | PRN
  Administered 2019-07-19: 30 mg via INTRAVENOUS

## 2019-07-19 MED ORDER — BUPIVACAINE LIPOSOME 1.3 % IJ SUSP
INTRAMUSCULAR | Status: DC | PRN
  Administered 2019-07-19: 20 mL

## 2019-07-19 MED ORDER — DOCUSATE SODIUM 100 MG PO CAPS: 100.0000 mg | ORAL_CAPSULE | Freq: Two times a day (BID) | ORAL | 0 refills | Status: AC | PRN

## 2019-07-19 MED ORDER — IBUPROFEN 800 MG PO TABS: 800.0000 mg | ORAL_TABLET | Freq: Three times a day (TID) | ORAL | 0 refills | Status: DC | PRN

## 2019-07-19 MED ORDER — ONDANSETRON HCL 4 MG/2ML IJ SOLN
INTRAMUSCULAR | Status: DC | PRN
  Administered 2019-07-19: 4 mg via INTRAVENOUS

## 2019-07-19 MED ORDER — FAMOTIDINE 20 MG PO TABS: 20.0000 mg | ORAL_TABLET | Freq: Once | ORAL | Status: AC

## 2019-07-19 MED ORDER — ROCURONIUM BROMIDE 50 MG/5ML IV SOLN
INTRAVENOUS | Status: AC
  Filled 2019-07-19: qty 1

## 2019-07-19 MED ORDER — FENTANYL CITRATE (PF) 100 MCG/2ML IJ SOLN
25.0000 ug | INTRAMUSCULAR | Status: DC | PRN
  Administered 2019-07-19 (×3): 25 ug via INTRAVENOUS

## 2019-07-19 MED ORDER — BUPIVACAINE LIPOSOME 1.3 % IJ SUSP
INTRAMUSCULAR | Status: AC
  Filled 2019-07-19: qty 20

## 2019-07-19 MED ORDER — FENTANYL CITRATE (PF) 100 MCG/2ML IJ SOLN
INTRAMUSCULAR | Status: DC | PRN
  Administered 2019-07-19 (×2): 50 ug via INTRAVENOUS

## 2019-07-19 MED ORDER — PROPOFOL 10 MG/ML IV BOLUS
INTRAVENOUS | Status: AC
  Filled 2019-07-19: qty 20

## 2019-07-19 MED ORDER — FENTANYL CITRATE (PF) 100 MCG/2ML IJ SOLN
INTRAMUSCULAR | Status: AC
  Filled 2019-07-19: qty 2

## 2019-07-19 MED ORDER — OXYCODONE HCL 5 MG/5ML PO SOLN: 5.0000 mg | Freq: Once | ORAL | Status: DC | PRN

## 2019-07-19 SURGICAL SUPPLY — 60 items
ADH SKN CLS APL DERMABOND .7 (GAUZE/BANDAGES/DRESSINGS) ×1
APL PRP STRL LF DISP 70% ISPRP (MISCELLANEOUS) ×1
BAG INFUSER PRESSURE 100CC (MISCELLANEOUS) IMPLANT
BLADE SURG SZ11 CARB STEEL (BLADE) ×3 IMPLANT
BNDG GAUZE 4.5X4.1 6PLY STRL (MISCELLANEOUS) ×3 IMPLANT
CANISTER SUCT 1200ML W/VALVE (MISCELLANEOUS) ×3 IMPLANT
CHLORAPREP W/TINT 26 (MISCELLANEOUS) ×3 IMPLANT
COVER TIP SHEARS 8 DVNC (MISCELLANEOUS) ×1 IMPLANT
COVER TIP SHEARS 8MM DA VINCI (MISCELLANEOUS) ×2
COVER WAND RF STERILE (DRAPES) IMPLANT
DEFOGGER SCOPE WARMER CLEARIFY (MISCELLANEOUS) ×3 IMPLANT
DERMABOND ADVANCED (GAUZE/BANDAGES/DRESSINGS) ×2
DERMABOND ADVANCED .7 DNX12 (GAUZE/BANDAGES/DRESSINGS) ×1 IMPLANT
DRAPE 3/4 80X56 (DRAPES) ×3 IMPLANT
DRAPE ARM DVNC X/XI (DISPOSABLE) ×4 IMPLANT
DRAPE COLUMN DVNC XI (DISPOSABLE) ×1 IMPLANT
DRAPE DA VINCI XI ARM (DISPOSABLE) ×8
DRAPE DA VINCI XI COLUMN (DISPOSABLE) ×2
ELECT CAUTERY BLADE 6.4 (BLADE) IMPLANT
ELECT REM PT RETURN 9FT ADLT (ELECTROSURGICAL) ×3
ELECTRODE REM PT RTRN 9FT ADLT (ELECTROSURGICAL) ×1 IMPLANT
GLOVE BIOGEL PI IND STRL 7.0 (GLOVE) ×2 IMPLANT
GLOVE BIOGEL PI INDICATOR 7.0 (GLOVE) ×4
GLOVE SURG SYN 6.5 ES PF (GLOVE) ×6 IMPLANT
GOWN STRL REUS W/ TWL LRG LVL3 (GOWN DISPOSABLE) ×3 IMPLANT
GOWN STRL REUS W/TWL LRG LVL3 (GOWN DISPOSABLE) ×9
IRRIGATOR SUCT 8 DISP DVNC XI (IRRIGATION / IRRIGATOR) IMPLANT
IRRIGATOR SUCTION 8MM XI DISP (IRRIGATION / IRRIGATOR)
IV NS 1000ML (IV SOLUTION)
IV NS 1000ML BAXH (IV SOLUTION) IMPLANT
KIT PINK PAD W/HEAD ARE REST (MISCELLANEOUS) ×3
KIT PINK PAD W/HEAD ARM REST (MISCELLANEOUS) ×1 IMPLANT
LABEL OR SOLS (LABEL) IMPLANT
MESH 3DMAX 4X6 RT LRG (Mesh General) ×2 IMPLANT
MESH 3DMAX MID 4X6 RT LRG (Mesh General) ×1 IMPLANT
NEEDLE HYPO 22GX1.5 SAFETY (NEEDLE) ×3 IMPLANT
NEEDLE INSUFFLATION 14GA 120MM (NEEDLE) ×3 IMPLANT
OBTURATOR OPTICAL STANDARD 8MM (TROCAR) ×2
OBTURATOR OPTICAL STND 8 DVNC (TROCAR) ×1
OBTURATOR OPTICALSTD 8 DVNC (TROCAR) ×1 IMPLANT
PACK LAP CHOLECYSTECTOMY (MISCELLANEOUS) ×3 IMPLANT
PENCIL ELECTRO HAND CTR (MISCELLANEOUS) ×3 IMPLANT
SEAL CANN UNIV 5-8 DVNC XI (MISCELLANEOUS) ×3 IMPLANT
SEAL XI 5MM-8MM UNIVERSAL (MISCELLANEOUS) ×6
SOLUTION ELECTROLUBE (MISCELLANEOUS) ×3 IMPLANT
STAPLER CANNULA SEAL DVNC XI (STAPLE) ×1 IMPLANT
STAPLER CANNULA SEAL XI (STAPLE) ×2
SUT DVC VLOC 3-0 CL 6 P-12 (SUTURE) ×6 IMPLANT
SUT MNCRL 4-0 (SUTURE) ×2
SUT MNCRL 4-0 27XMFL (SUTURE) ×1
SUT MNCRL AB 4-0 PS2 18 (SUTURE) ×3 IMPLANT
SUT VIC AB 2-0 SH 27 (SUTURE) ×4
SUT VIC AB 2-0 SH 27XBRD (SUTURE) ×2 IMPLANT
SUT VIC AB 3-0 SH 27 (SUTURE) ×3
SUT VIC AB 3-0 SH 27X BRD (SUTURE) ×1 IMPLANT
SUT VICRYL 0 AB UR-6 (SUTURE) ×3 IMPLANT
SUTURE MNCRL 4-0 27XMF (SUTURE) ×1 IMPLANT
SYR 30ML LL (SYRINGE) ×3 IMPLANT
TRAY FOLEY MTR SLVR 16FR STAT (SET/KITS/TRAYS/PACK) ×3 IMPLANT
TUBING EVAC SMOKE HEATED PNEUM (TUBING) ×3 IMPLANT

## 2019-07-19 NOTE — Anesthesia Preprocedure Evaluation (Signed)
Anesthesia Evaluation  Patient identified by MRN, date of birth, ID band Patient awake    Reviewed: Allergy & Precautions, NPO status , Patient's Chart, lab work & pertinent test results  History of Anesthesia Complications Negative for: history of anesthetic complications  Airway Mallampati: II  TM Distance: >3 FB Neck ROM: Full    Dental  (+) Poor Dentition   Pulmonary neg sleep apnea, COPD,  COPD inhaler, Current Smoker and Patient abstained from smoking.,    breath sounds clear to auscultation- rhonchi (-) wheezing      Cardiovascular hypertension, Pt. on medications (-) CAD, (-) Past MI, (-) Cardiac Stents and (-) CABG  Rhythm:Regular Rate:Normal - Systolic murmurs and - Diastolic murmurs    Neuro/Psych neg Seizures PSYCHIATRIC DISORDERS Anxiety negative neurological ROS     GI/Hepatic negative GI ROS, Neg liver ROS,   Endo/Other  negative endocrine ROSneg diabetes  Renal/GU negative Renal ROS     Musculoskeletal negative musculoskeletal ROS (+)   Abdominal (+) - obese,   Peds  Hematology negative hematology ROS (+)   Anesthesia Other Findings Past Medical History: No date: Anxiety No date: Bronchitis 2016: Cancer ([place]) No date: COPD (chronic obstructive pulmonary disease) ([place]) 07/13/2007: Cough     Comment:  Qualifier: Diagnosis of  By: Wynona Luna  No date: Dyspnea     Comment:  due to copd No date: Hypertension     Comment:  pt denies this and is not on bp meds but vascular note               states htn 07/13/2007: LUNG NODULE     Comment:  Qualifier: Diagnosis of  By: Wynona Luna  2013: Pneumonia No date: Thoracic aortic aneurysm ([place])     Comment:  followed by vascular   Reproductive/Obstetrics                             Anesthesia Physical Anesthesia Plan  ASA: III  Anesthesia Plan: General   Post-op Pain Management:    Induction:  Intravenous  PONV Risk Score and Plan: 0 and Ondansetron and Dexamethasone  Airway Management Planned: Oral ETT  Additional Equipment:   Intra-op Plan:   Post-operative Plan: Extubation in OR  Informed Consent: I have reviewed the patients History and Physical, chart, labs and discussed the procedure including the risks, benefits and alternatives for the proposed anesthesia with the patient or authorized representative who has indicated his/her understanding and acceptance.     Dental advisory given  Plan Discussed with: CRNA and Anesthesiologist  Anesthesia Plan Comments:         Anesthesia Quick Evaluation

## 2019-07-19 NOTE — Discharge Instructions (Addendum)
Hernia repair, Care After This sheet gives you information about how to care for yourself after your procedure. Your health care provider may also give you more specific instructions. If you have problems or questions, contact your health care provider. What can I expect after the procedure? After your procedure, it is common to have the following:  Pain in your abdomen, especially in the incision areas. You will be given medicine to control the pain.  Tiredness. This is a normal part of the recovery process. Your energy level will return to normal over the next several weeks.  Changes in your bowel movements, such as constipation or needing to go more often. Talk with your health care provider about how to manage this. Follow these instructions at home: Medicines   tylenol and advil as needed for discomfort.  Please alternate between the two every four hours as needed for pain.     Use narcotics, if prescribed, only when tylenol and motrin is not enough to control pain.   325-650mg  every 8hrs to max of 3000mg /24hrs (including the 325mg  in every norco dose) for the tylenol.     Advil up to 800mg  per dose every 8hrs as needed for pain.    PLEASE RECORD NUMBER OF PILLS TAKEN UNTIL NEXT FOLLOW UP APPT.  THIS WILL HELP DETERMINE HOW READY YOU ARE TO BE RELEASED FROM ANY ACTIVITY RESTRICTIONS  Do not drive or use heavy machinery while taking prescription pain medicine.  Do not drink alcohol while taking prescription pain medicine.  Incision care     Follow instructions from your health care provider about how to take care of your incision areas. Make sure you: ? Keep your incisions clean and dry. ? Wash your hands with soap and water before and after applying medicine to the areas, and before and after changing your bandage (dressing). If soap and water are not available, use hand sanitizer. ? Change your dressing as told by your health care provider. ? Leave stitches (sutures), skin  glue, or adhesive strips in place. These skin closures may need to stay in place for 2 weeks or longer. If adhesive strip edges start to loosen and curl up, you may trim the loose edges. Do not remove adhesive strips completely unless your health care provider tells you to do that.  Do not wear tight clothing over the incisions. Tight clothing may rub and irritate the incision areas, which may cause the incisions to open.  Do not take baths, swim, or use a hot tub until your health care provider approves. OK TO SHOWER IN 24HRS.    Check your incision area every day for signs of infection. Check for: ? More redness, swelling, or pain. ? More fluid or blood. ? Warmth. ? Pus or a bad smell. Activity  Avoid lifting anything that is heavier than 10 lb (4.5 kg) for 2 weeks or until your health care provider says it is okay.  No pushing/pulling greater than 30lbs  You may resume normal activities as told by your health care provider. Ask your health care provider what activities are safe for you.  Take rest breaks during the day as needed. Eating and drinking  Follow instructions from your health care provider about what you can eat after surgery.  To prevent or treat constipation while you are taking prescription pain medicine, your health care provider may recommend that you: ? Drink enough fluid to keep your urine clear or pale yellow. ? Take over-the-counter or prescription medicines. ?  Eat foods that are high in fiber, such as fresh fruits and vegetables, whole grains, and beans. ? Limit foods that are high in fat and processed sugars, such as fried and sweet foods. General instructions  Ask your health care provider when you will need an appointment to get your sutures or staples removed.  Keep all follow-up visits as told by your health care provider. This is important. Contact a health care provider if:  You have more redness, swelling, or pain around your incisions.  You have  more fluid or blood coming from the incisions.  Your incisions feel warm to the touch.  You have pus or a bad smell coming from your incisions or your dressing.  You have a fever.  You have an incision that breaks open (edges not staying together) after sutures or staples have been removed. Get help right away if:  You develop a rash.  You have chest pain or difficulty breathing.  You have pain or swelling in your legs.  You feel light-headed or you faint.  Your abdomen swells (becomes distended).  You have nausea or vomiting.  You have blood in your stool (feces). This information is not intended to replace advice given to you by your health care provider. Make sure you discuss any questions you have with your health care provider. Document Released: 02/11/2005 Document Revised: 04/13/2018 Document Reviewed: 04/25/2016 Elsevier Interactive Patient Education  2019 [place]   1) The drugs that you were given will stay in your system until tomorrow so for the next 24 hours you should not:  A) Drive an automobile B) Make any legal decisions C) Drink any alcoholic beverage   2) You may resume regular meals tomorrow.  Today it is better to start with liquids and gradually work up to solid foods.  You may eat anything you prefer, but it is better to start with liquids, then soup and crackers, and gradually work up to solid foods.   3) Please notify your doctor immediately if you have any unusual bleeding, trouble breathing, redness and pain at the surgery site, drainage, fever, or pain not relieved by medication.    4) Additional Instructions:        Please contact your physician with any problems or Same Day Surgery at [phone], Monday through Friday 6 am to 4 pm, or [place] at [facility] number at [phone].

## 2019-07-19 NOTE — Anesthesia Postprocedure Evaluation (Signed)
Anesthesia Post Note  Patient: Stephen Leon  Procedure(s) Performed: XI ROBOTIC ASSISTED INGUINAL HERNIA REPAIR WITH MESH (Right Abdomen)  Patient location during evaluation: PACU Anesthesia Type: General Level of consciousness: awake and alert and oriented Pain management: pain level controlled Vital Signs Assessment: post-procedure vital signs reviewed and stable Respiratory status: spontaneous breathing, nonlabored ventilation and respiratory function stable Cardiovascular status: blood pressure returned to baseline and stable Postop Assessment: no signs of nausea or vomiting Anesthetic complications: no     Last Vitals:  Vitals:   07/19/19 1203 07/19/19 1234  BP: (!) 143/75 129/73  Pulse: 66 64  Resp: 18 18  Temp:  37.1 C  SpO2: 95% 94%    Last Pain:  Vitals:   07/19/19 1234  TempSrc: Temporal  PainSc: 5                  Charleigh Correnti

## 2019-07-19 NOTE — Transfer of Care (Signed)
Immediate Anesthesia Transfer of Care Note  Patient: Stephen Leon  Procedure(s) Performed: XI ROBOTIC ASSISTED INGUINAL HERNIA REPAIR WITH MESH (Right Abdomen)  Patient Location: PACU  Anesthesia Type:General  Level of Consciousness: awake, alert  and oriented  Airway & Oxygen Therapy: Patient Spontanous Breathing and Patient connected to face mask oxygen  Post-op Assessment: Report given to RN and Post -op Vital signs reviewed and stable  Post vital signs: Reviewed and stable  Last Vitals:  Vitals Value Taken Time  BP 157/73 07/19/19 0919  Temp 36.4 C 07/19/19 0919  Pulse 60 07/19/19 0921  Resp 13 07/19/19 0921  SpO2 100 % 07/19/19 0921  Vitals shown include unvalidated device data.  Last Pain:  Vitals:   07/19/19 0612  TempSrc: Tympanic  PainSc: 4          Complications: No apparent anesthesia complications

## 2019-07-19 NOTE — Anesthesia Procedure Notes (Signed)
Procedure Name: Intubation Date/Time: 07/19/2019 7:40 AM Performed by: Allean Found, CRNA Pre-anesthesia Checklist: Patient identified, Patient being monitored, Timeout performed, Emergency Drugs available and Suction available Patient Re-evaluated:Patient Re-evaluated prior to induction Oxygen Delivery Method: Circle system utilized Preoxygenation: Pre-oxygenation with 100% oxygen Induction Type: IV induction Ventilation: Mask ventilation without difficulty Laryngoscope Size: McGraph and 4 Grade View: Grade I Tube type: Oral Tube size: 7.5 mm Number of attempts: 2 Airway Equipment and Method: Stylet Placement Confirmation: ETT inserted through vocal cords under direct vision,  positive ETCO2 and breath sounds checked- equal and bilateral Secured at: 23 cm Tube secured with: Tape Dental Injury: Teeth and Oropharynx as per pre-operative assessment

## 2019-07-19 NOTE — Op Note (Signed)
Preoperative diagnosis: Right inguinal Hernia.  Postoperative diagnosis: Right direct inguinal Hernia  Procedure: Robotic assisted laparoscopic right inguinal hernia repair with mesh  Anesthesia: General  Surgeon: Dr. Lysle Pearl  Wound Classification: Clean  Specimen: none  Complications: None  Estimated Blood Loss: 34mL   Indications:  inguinal hernia. Repair was indicated to avoid complications of incarceration, obstruction and pain, and a prosthetic mesh repair was elected.  See H&P for further details.  Findings: 1. Vas Deferens and cord structures identified and preserved 2. Bard 3D max medium weight mesh used for repair 3. Adequate hemostasis achieved  Description of procedure: The patient was taken to the operating room. A time-out was completed verifying correct patient, procedure, site, positioning, and implant(s) and/or special equipment prior to beginning this procedure.  Area was prepped and draped in the usual sterile fashion. An incision was marked 20 cm above the pubic tubercle, slightly above the umbilicus  Scrotum wrapped in Kerlix roll.  Foley catheter placed.  Incision was made at the previously marked site after injecting local anesthesia. Veress needle inserted at palmer's point.  Saline drop test noted to be positive with gradual increase in pressure after initiation of gas insufflation.  15 mm of pressure was achieved prior to removing the Veress needle and then placing a 8 mm port via the Optiview technique through the supraumbilical site.  Inspection of the area afterwards noted no injury to the surrounding organs during insertion of the needle and the port.  2 port sites were marked 8 cm to the lateral sides of the initial port, and a 8 mm robotic port was placed on the left side, another 8 mm robotic port on the right side under direct supervision.  Local anesthesia  infused to the preplanned incision site prior to insertion of the port.  The [place] was  then brought into the operative field and docked to the ports.  Examination of the abdominal cavity noted a right direct inguinal hernia.  A peritoneal flap was created approximately 8cm cephalad to the defect by using scissors with electrocautery.  Dissection was carried down towards the pubic tubercle, developing the myopectineal orifice view.  Laterally the flap was carried towards the ASIS.  Small hernia sac was noted, which carefully dissected away from the adjacent tissues to be fully reduced out of hernia cavity.  Any bleeding was controlled with combination of electrocautery and manual pressure.    After confirming adequate dissection and the peritoneal reflection completely down and away from the cord structures, a Bard 3DMax medium weight large mesh was placed within the anterior abdominal wall, secured in place using 2-0 Vicryl on an SH needle immediately above the pubic tubercle.  After noting proper placement of the mesh with the peritoneal reflection deep to it, the previously created peritoneal flap was secured back up to the anterior abdominal wall using running 3-0 V-Lock.  Both needles were then removed out of the abdominal cavity, Xi platform undocked from the ports and removed off of operative field.  exparel infused as ilioinguinal block.  Abdomen then desufflated and ports removed. All the skin incisions were then closed with a subcuticular stitch of Monocryl 4-0. Dermabond was applied. The testis was gently pulled down into its anatomic position in the scrotum.  Foley catheter removed. The patient tolerated the procedure well and was taken to the postanesthesia care unit in stable condition. Sponge and instrument count correct at end of procedure.

## 2019-07-19 NOTE — Anesthesia Post-op Follow-up Note (Signed)
Anesthesia QCDR form completed.        

## 2019-07-19 NOTE — Interval H&P Note (Signed)
History and Physical Interval Note:  07/19/2019 7:03 AM  Stephen Leon  has presented today for surgery, with the diagnosis of K40.90 Right Inguinal hernia.  The various methods of treatment have been discussed with the patient and family. After consideration of risks, benefits and other options for treatment, the patient has consented to  Procedure(s): XI ROBOTIC [place] (Right) as a surgical intervention.  The patient's history has been reviewed, patient examined, no change in status, stable for surgery.  I have reviewed the patient's chart and labs.  Questions were answered to the patient's satisfaction.     Cindy Brindisi Lysle Pearl

## 2019-08-06 DIAGNOSIS — J432 Centrilobular emphysema: Secondary | ICD-10-CM | POA: Diagnosis not present

## 2019-08-06 DIAGNOSIS — R05 Cough: Secondary | ICD-10-CM | POA: Diagnosis not present

## 2019-08-06 DIAGNOSIS — R0600 Dyspnea, unspecified: Secondary | ICD-10-CM | POA: Diagnosis not present

## 2019-12-17 DIAGNOSIS — Z01818 Encounter for other preprocedural examination: Secondary | ICD-10-CM | POA: Diagnosis not present

## 2019-12-17 DIAGNOSIS — J439 Emphysema, unspecified: Secondary | ICD-10-CM | POA: Diagnosis not present

## 2019-12-17 DIAGNOSIS — R05 Cough: Secondary | ICD-10-CM | POA: Diagnosis not present

## 2019-12-17 DIAGNOSIS — J418 Mixed simple and mucopurulent chronic bronchitis: Secondary | ICD-10-CM | POA: Diagnosis not present

## 2019-12-17 DIAGNOSIS — I712 Thoracic aortic aneurysm, without rupture: Secondary | ICD-10-CM | POA: Diagnosis not present

## 2019-12-17 DIAGNOSIS — R0600 Dyspnea, unspecified: Secondary | ICD-10-CM | POA: Diagnosis not present

## 2020-01-08 DIAGNOSIS — J440 Chronic obstructive pulmonary disease with acute lower respiratory infection: Secondary | ICD-10-CM | POA: Diagnosis not present

## 2020-01-08 DIAGNOSIS — G8912 Acute post-thoracotomy pain: Secondary | ICD-10-CM | POA: Diagnosis not present

## 2020-01-08 DIAGNOSIS — Z902 Acquired absence of lung [part of]: Secondary | ICD-10-CM | POA: Diagnosis not present

## 2020-01-08 DIAGNOSIS — I10 Essential (primary) hypertension: Secondary | ICD-10-CM | POA: Diagnosis not present

## 2020-01-08 DIAGNOSIS — J209 Acute bronchitis, unspecified: Secondary | ICD-10-CM | POA: Diagnosis not present

## 2020-01-08 DIAGNOSIS — F411 Generalized anxiety disorder: Secondary | ICD-10-CM | POA: Diagnosis not present

## 2020-01-08 DIAGNOSIS — Z720 Tobacco use: Secondary | ICD-10-CM | POA: Diagnosis not present

## 2020-01-08 DIAGNOSIS — F330 Major depressive disorder, recurrent, mild: Secondary | ICD-10-CM | POA: Diagnosis not present

## 2020-01-08 DIAGNOSIS — M792 Neuralgia and neuritis, unspecified: Secondary | ICD-10-CM | POA: Diagnosis not present

## 2020-01-08 DIAGNOSIS — Z0000 Encounter for general adult medical examination without abnormal findings: Secondary | ICD-10-CM | POA: Diagnosis not present

## 2020-01-08 DIAGNOSIS — R29898 Other symptoms and signs involving the musculoskeletal system: Secondary | ICD-10-CM | POA: Diagnosis not present

## 2020-01-14 IMAGING — MR MRI THORACIC SPINE WITHOUT CONTRAST
6 series · 36 of 48 positions shown · non-contrast
Comparison: Thoracic spine MRI 12/11/2018, vertebroplasty images
2126.

CLINICAL DATA: 72-year-old male with back pain for 1 week, history
of compression fracture and vertebral augmentation.

EXAM:
MRI THORACIC SPINE WITHOUT CONTRAST
TECHNIQUE: Multiplanar, multisequence MR imaging of the thoracic spine was
performed. No intravenous contrast was administered.

[Series 16: T1 · sagittal · 5.0mm · 1.88mm/px · 4 of 9 slices shown (1 of 2)]
[im 1/9]
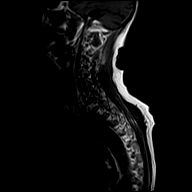
[im 3/9]
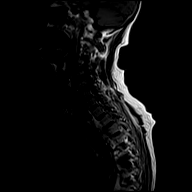
[im 6/9]
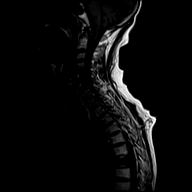
[im 9/9]
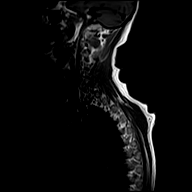

[Series 17: T2 · sagittal · 3.0mm · 1.33mm/px · 6 of 19 slices shown (1 of 2)]
[im 1/19]
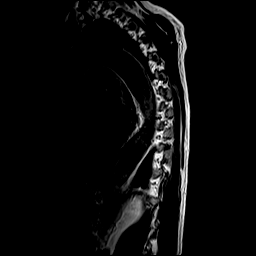
[im 4/19]
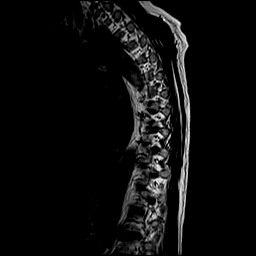
[im 8/19]
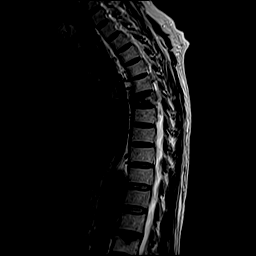
[im 11/19]
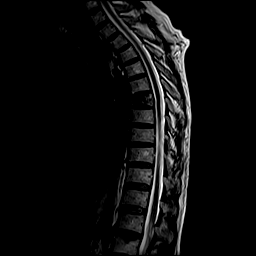
[im 15/19]
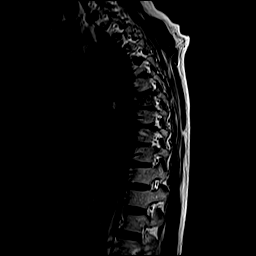
[im 19/19]
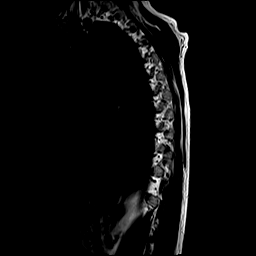

[Series 18: T1 · sagittal · 3.0mm · 1.33mm/px · 6 of 19 slices shown (2 of 2)]
[im 1/19]
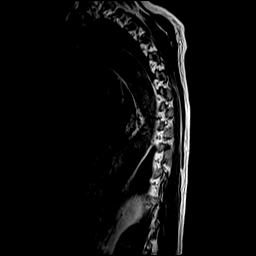
[im 4/19]
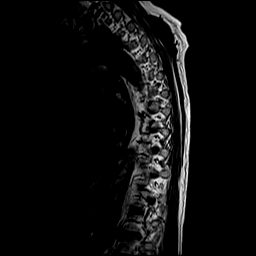
[im 8/19]
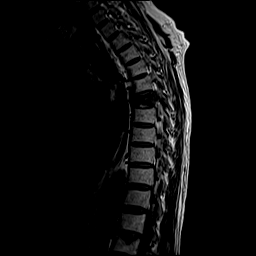
[im 11/19]
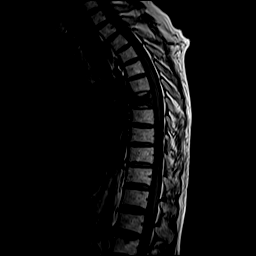
[im 15/19]
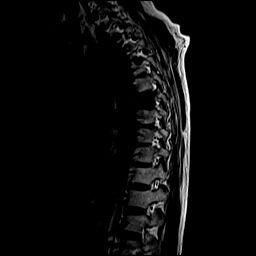
[im 19/19]
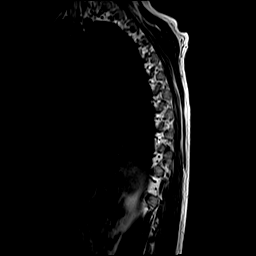

[Series 19: STIR · sagittal · 3.0mm · 0.66mm/px · 6 of 19 slices shown]
[im 1/19]
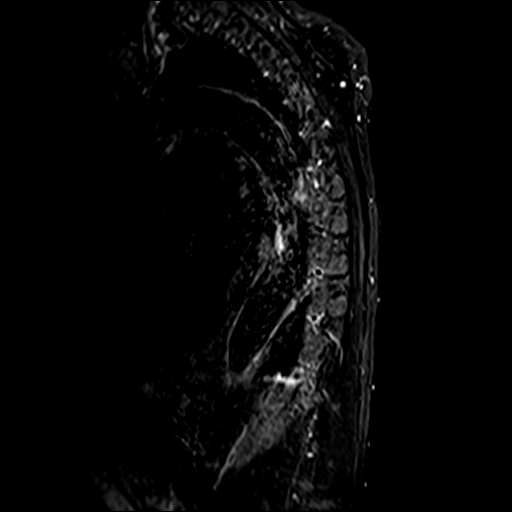
[im 4/19]
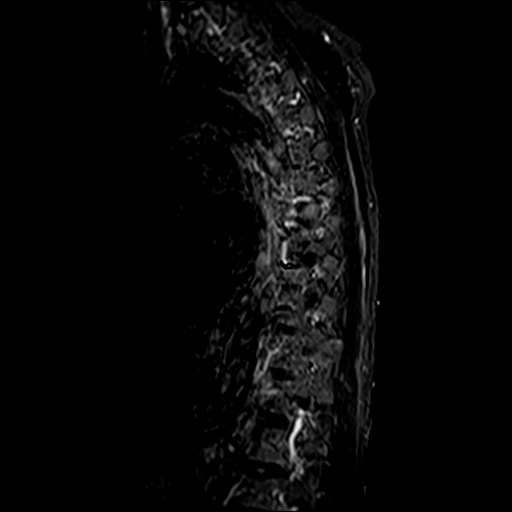
[im 8/19]
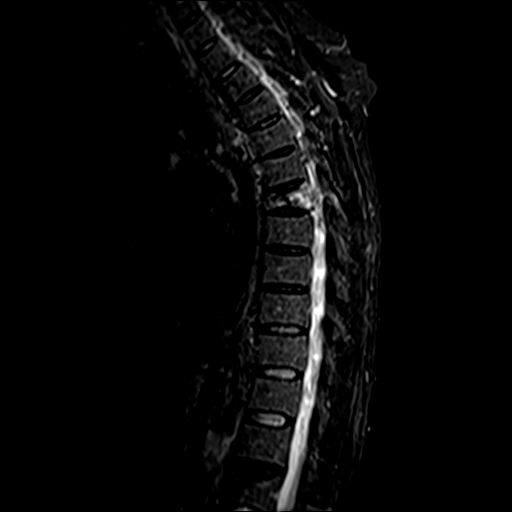
[im 11/19]
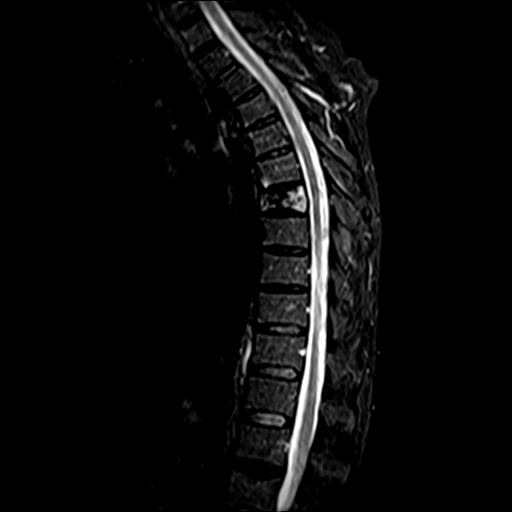
[im 15/19]
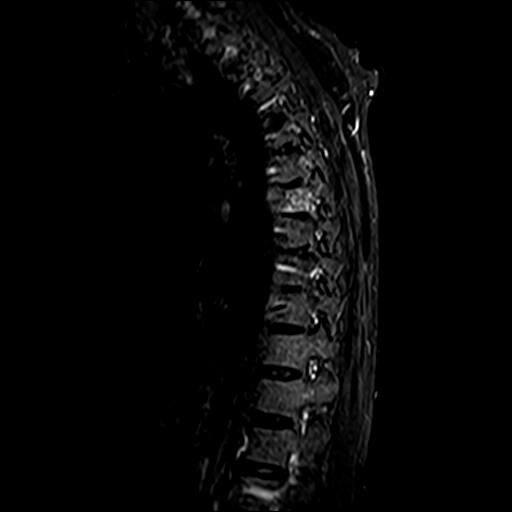
[im 19/19]
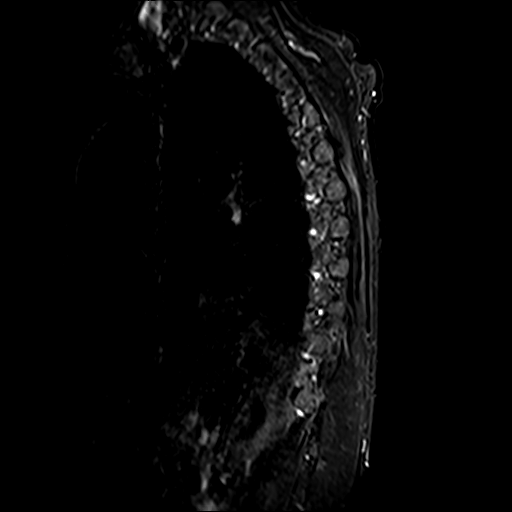

[Series 20: T2 · axial · 4.0mm · 0.59mm/px · z∈[-228,+3]mm · 9 of 41 slices shown (2 of 2)]
[im 1/41]
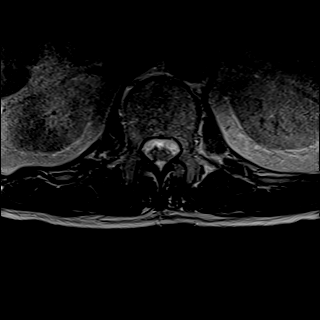
[im 7/41]
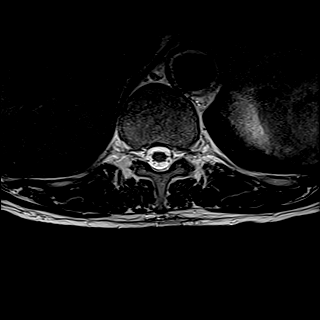
[im 14/41]
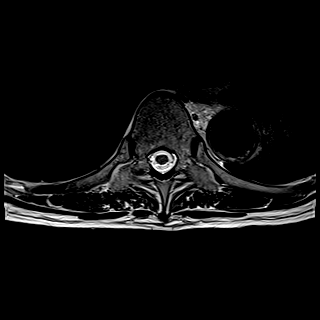
[im 17/41]
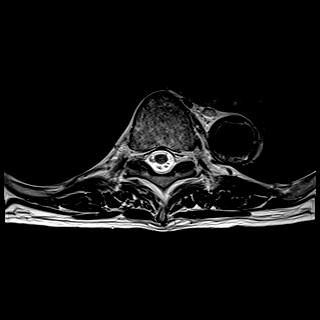
[im 21/41]
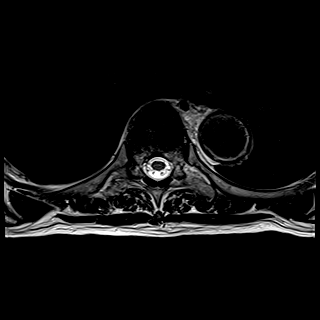
[im 24/41]
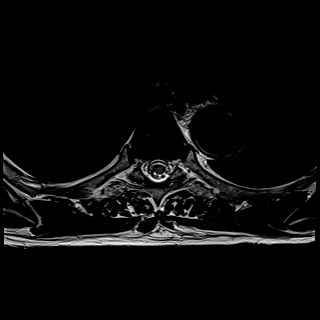
[im 27/41]
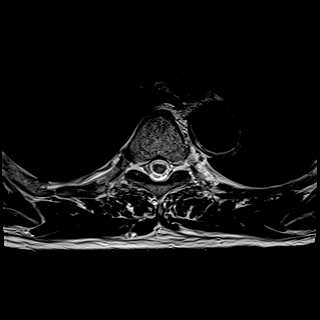
[im 34/41]
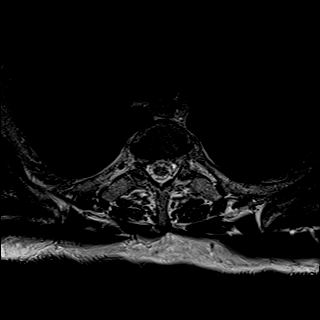
[im 41/41]
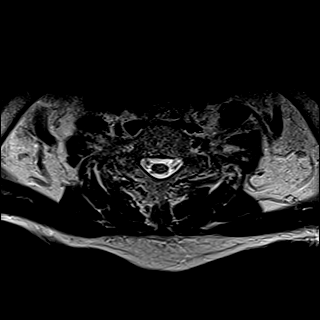

[Series 21: GRE · axial · 4.0mm · 0.37mm/px · z∈[-228,-76]mm · 5 of 41 slices shown]
[im 1/41]
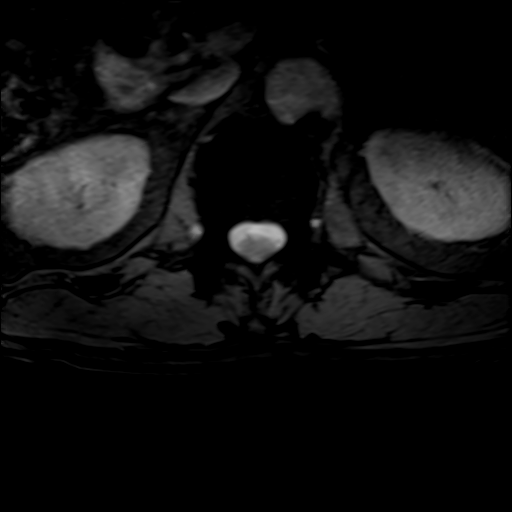
[im 7/41]
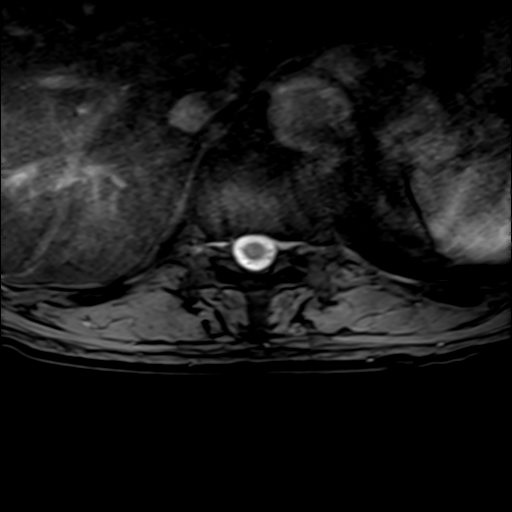
[im 14/41]
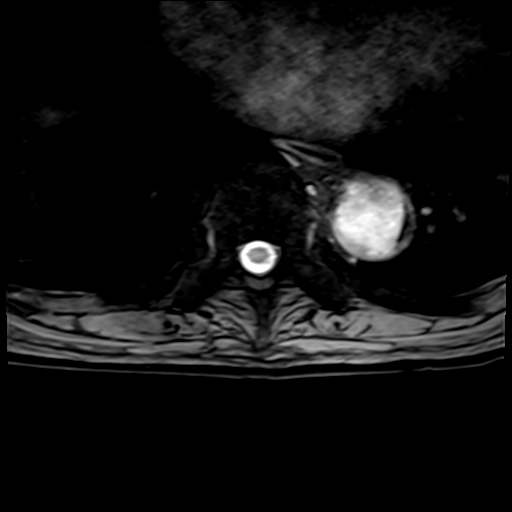
[im 17/41]
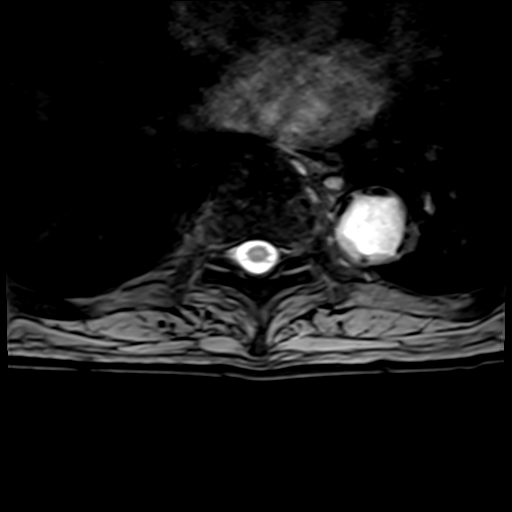
[im 24/41]
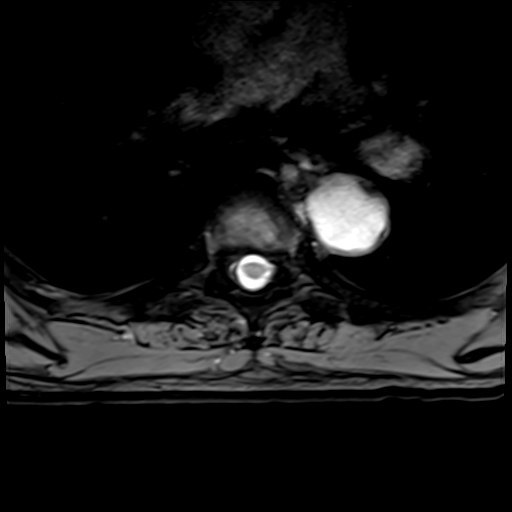

[36 of 48 positions shown; findings below may reference images not displayed]

FINDINGS: Limited cervical spine imaging:  Stable and negative for age.

Thoracic spine segmentation: Appears to be normal, the same
numbering system used last month.

Alignment:  Stable thoracic kyphosis.  No spondylolisthesis.

Vertebrae: Augmented T7 compression fracture with residual marrow
edema. No significant retropulsion or complicating features
identified.

Other thoracic levels appear stable and intact. There is a mild
chronic L2 superior endplate compression fracture. No marrow edema
or evidence of acute osseous abnormality. Normal background bone
marrow signal.

Cord: Spinal cord signal is within normal limits at all visualized
levels. Fairly capacious spinal canal.

Paraspinal and other soft tissues: Negative visible thoracic and
upper abdominal viscera. Negative visualized posterior paraspinal
soft tissues.

Disc levels:

Unchanged since 12/11/2018 with mild for age thoracic spine
degeneration. No thoracic spinal stenosis or convincing foraminal
stenosis.
IMPRESSION: No acute findings. Augmentation of the T7 compression fracture with
otherwise unchanged MRI appearance of the thoracic spine since
12/11/2018.

## 2020-03-02 DIAGNOSIS — H43393 Other vitreous opacities, bilateral: Secondary | ICD-10-CM | POA: Diagnosis not present

## 2020-03-02 DIAGNOSIS — H40033 Anatomical narrow angle, bilateral: Secondary | ICD-10-CM | POA: Diagnosis not present

## 2020-03-04 DIAGNOSIS — M5489 Other dorsalgia: Secondary | ICD-10-CM | POA: Diagnosis not present

## 2020-03-04 DIAGNOSIS — M546 Pain in thoracic spine: Secondary | ICD-10-CM | POA: Diagnosis not present

## 2020-03-04 DIAGNOSIS — Z902 Acquired absence of lung [part of]: Secondary | ICD-10-CM | POA: Diagnosis not present

## 2020-03-04 DIAGNOSIS — M792 Neuralgia and neuritis, unspecified: Secondary | ICD-10-CM | POA: Diagnosis not present

## 2020-03-04 DIAGNOSIS — R109 Unspecified abdominal pain: Secondary | ICD-10-CM | POA: Diagnosis not present

## 2020-03-05 ENCOUNTER — Other Ambulatory Visit: Payer: Self-pay | Admitting: Infectious Diseases

## 2020-03-05 DIAGNOSIS — C3490 Malignant neoplasm of unspecified part of unspecified bronchus or lung: Secondary | ICD-10-CM

## 2020-03-05 DIAGNOSIS — Z902 Acquired absence of lung [part of]: Secondary | ICD-10-CM

## 2020-03-05 DIAGNOSIS — I712 Thoracic aortic aneurysm, without rupture, unspecified: Secondary | ICD-10-CM

## 2020-03-06 ENCOUNTER — Ambulatory Visit: Payer: Medicare HMO

## 2020-03-09 ENCOUNTER — Other Ambulatory Visit: Payer: Self-pay

## 2020-03-09 ENCOUNTER — Ambulatory Visit
Admission: RE | Admit: 2020-03-09 | Discharge: 2020-03-09 | Disposition: A | Discharge status: Home / Self Care | Payer: Medicare HMO | Source: Ambulatory Visit | Attending: Infectious Diseases | Admitting: Infectious Diseases

## 2020-03-09 DIAGNOSIS — I712 Thoracic aortic aneurysm, without rupture, unspecified: Secondary | ICD-10-CM

## 2020-03-09 DIAGNOSIS — J929 Pleural plaque without asbestos: Secondary | ICD-10-CM | POA: Diagnosis not present

## 2020-03-09 DIAGNOSIS — Z902 Acquired absence of lung [part of]: Secondary | ICD-10-CM | POA: Insufficient documentation

## 2020-03-09 DIAGNOSIS — C3490 Malignant neoplasm of unspecified part of unspecified bronchus or lung: Secondary | ICD-10-CM | POA: Insufficient documentation

## 2020-03-09 DIAGNOSIS — J984 Other disorders of lung: Secondary | ICD-10-CM | POA: Diagnosis not present

## 2020-03-09 DIAGNOSIS — J439 Emphysema, unspecified: Secondary | ICD-10-CM | POA: Diagnosis not present

## 2020-03-09 DIAGNOSIS — I700 Atherosclerosis of aorta: Secondary | ICD-10-CM | POA: Diagnosis not present

## 2020-03-11 DIAGNOSIS — Z87898 Personal history of other specified conditions: Secondary | ICD-10-CM | POA: Diagnosis not present

## 2020-03-11 DIAGNOSIS — Z8709 Personal history of other diseases of the respiratory system: Secondary | ICD-10-CM | POA: Diagnosis not present

## 2020-03-11 DIAGNOSIS — R0602 Shortness of breath: Secondary | ICD-10-CM | POA: Diagnosis not present

## 2020-03-16 DIAGNOSIS — C3490 Malignant neoplasm of unspecified part of unspecified bronchus or lung: Secondary | ICD-10-CM | POA: Diagnosis not present

## 2020-03-16 DIAGNOSIS — I10 Essential (primary) hypertension: Secondary | ICD-10-CM | POA: Diagnosis not present

## 2020-03-16 DIAGNOSIS — R079 Chest pain, unspecified: Secondary | ICD-10-CM | POA: Diagnosis not present

## 2020-03-16 DIAGNOSIS — R0602 Shortness of breath: Secondary | ICD-10-CM | POA: Insufficient documentation

## 2020-03-16 DIAGNOSIS — Z902 Acquired absence of lung [part of]: Secondary | ICD-10-CM | POA: Diagnosis not present

## 2020-03-16 DIAGNOSIS — Z720 Tobacco use: Secondary | ICD-10-CM | POA: Diagnosis not present

## 2020-03-16 DIAGNOSIS — C801 Malignant (primary) neoplasm, unspecified: Secondary | ICD-10-CM | POA: Diagnosis not present

## 2020-03-16 DIAGNOSIS — I712 Thoracic aortic aneurysm, without rupture: Secondary | ICD-10-CM | POA: Diagnosis not present

## 2020-03-16 DIAGNOSIS — J418 Mixed simple and mucopurulent chronic bronchitis: Secondary | ICD-10-CM | POA: Diagnosis not present

## 2020-04-10 DIAGNOSIS — Z87898 Personal history of other specified conditions: Secondary | ICD-10-CM | POA: Diagnosis not present

## 2020-04-10 DIAGNOSIS — R0602 Shortness of breath: Secondary | ICD-10-CM | POA: Diagnosis not present

## 2020-05-05 DIAGNOSIS — R0602 Shortness of breath: Secondary | ICD-10-CM | POA: Diagnosis not present

## 2020-05-05 DIAGNOSIS — R079 Chest pain, unspecified: Secondary | ICD-10-CM | POA: Diagnosis not present

## 2020-05-11 DIAGNOSIS — Z902 Acquired absence of lung [part of]: Secondary | ICD-10-CM | POA: Diagnosis not present

## 2020-05-11 DIAGNOSIS — I10 Essential (primary) hypertension: Secondary | ICD-10-CM | POA: Diagnosis not present

## 2020-05-11 DIAGNOSIS — J418 Mixed simple and mucopurulent chronic bronchitis: Secondary | ICD-10-CM | POA: Diagnosis not present

## 2020-05-11 DIAGNOSIS — Z720 Tobacco use: Secondary | ICD-10-CM | POA: Diagnosis not present

## 2020-05-11 DIAGNOSIS — D649 Anemia, unspecified: Secondary | ICD-10-CM | POA: Diagnosis not present

## 2020-05-11 DIAGNOSIS — J440 Chronic obstructive pulmonary disease with acute lower respiratory infection: Secondary | ICD-10-CM | POA: Diagnosis not present

## 2020-05-11 DIAGNOSIS — C3490 Malignant neoplasm of unspecified part of unspecified bronchus or lung: Secondary | ICD-10-CM | POA: Diagnosis not present

## 2020-05-11 DIAGNOSIS — R0602 Shortness of breath: Secondary | ICD-10-CM | POA: Diagnosis not present

## 2020-05-11 DIAGNOSIS — G8912 Acute post-thoracotomy pain: Secondary | ICD-10-CM | POA: Diagnosis not present

## 2020-05-11 DIAGNOSIS — I712 Thoracic aortic aneurysm, without rupture: Secondary | ICD-10-CM | POA: Diagnosis not present

## 2020-05-11 DIAGNOSIS — J209 Acute bronchitis, unspecified: Secondary | ICD-10-CM | POA: Diagnosis not present

## 2020-05-11 DIAGNOSIS — R29898 Other symptoms and signs involving the musculoskeletal system: Secondary | ICD-10-CM | POA: Diagnosis not present

## 2020-05-11 DIAGNOSIS — Z01818 Encounter for other preprocedural examination: Secondary | ICD-10-CM | POA: Diagnosis not present

## 2020-05-11 DIAGNOSIS — R079 Chest pain, unspecified: Secondary | ICD-10-CM | POA: Diagnosis not present

## 2020-05-11 DIAGNOSIS — F411 Generalized anxiety disorder: Secondary | ICD-10-CM | POA: Diagnosis not present

## 2020-05-15 ENCOUNTER — Other Ambulatory Visit: Payer: Self-pay

## 2020-05-15 ENCOUNTER — Other Ambulatory Visit
Admission: RE | Admit: 2020-05-15 | Discharge: 2020-05-15 | Disposition: A | Discharge status: Home / Self Care | Payer: Medicare HMO | Source: Ambulatory Visit | Attending: Cardiology | Admitting: Cardiology

## 2020-05-15 DIAGNOSIS — Z20822 Contact with and (suspected) exposure to covid-19: Secondary | ICD-10-CM | POA: Insufficient documentation

## 2020-05-15 DIAGNOSIS — Z01812 Encounter for preprocedural laboratory examination: Secondary | ICD-10-CM | POA: Insufficient documentation

## 2020-05-15 LAB — SARS CORONAVIRUS 2 (TAT 6-24 HRS): SARS Coronavirus 2: NEGATIVE

## 2020-05-18 DIAGNOSIS — I700 Atherosclerosis of aorta: Secondary | ICD-10-CM | POA: Diagnosis not present

## 2020-05-18 DIAGNOSIS — Z0000 Encounter for general adult medical examination without abnormal findings: Secondary | ICD-10-CM | POA: Diagnosis not present

## 2020-05-18 DIAGNOSIS — Z902 Acquired absence of lung [part of]: Secondary | ICD-10-CM | POA: Diagnosis not present

## 2020-05-18 DIAGNOSIS — Z720 Tobacco use: Secondary | ICD-10-CM | POA: Diagnosis not present

## 2020-05-18 DIAGNOSIS — I10 Essential (primary) hypertension: Secondary | ICD-10-CM | POA: Diagnosis not present

## 2020-05-18 DIAGNOSIS — J470 Bronchiectasis with acute lower respiratory infection: Secondary | ICD-10-CM | POA: Diagnosis not present

## 2020-05-18 DIAGNOSIS — J920 Pleural plaque with presence of asbestos: Secondary | ICD-10-CM | POA: Diagnosis not present

## 2020-05-18 DIAGNOSIS — R079 Chest pain, unspecified: Secondary | ICD-10-CM | POA: Diagnosis not present

## 2020-05-18 DIAGNOSIS — Z23 Encounter for immunization: Secondary | ICD-10-CM | POA: Diagnosis not present

## 2020-05-19 ENCOUNTER — Encounter
Admission: RE | Disposition: A | Discharge status: Home / Self Care | Payer: Self-pay | Source: Home / Self Care | Attending: Cardiology

## 2020-05-19 ENCOUNTER — Other Ambulatory Visit: Payer: Self-pay

## 2020-05-19 ENCOUNTER — Ambulatory Visit
Admission: RE | Admit: 2020-05-19 | Discharge: 2020-05-19 | Disposition: A | Discharge status: Home / Self Care | Payer: Medicare HMO | Attending: Cardiology | Admitting: Cardiology

## 2020-05-19 ENCOUNTER — Encounter: Payer: Self-pay | Admitting: Cardiology

## 2020-05-19 DIAGNOSIS — Z79899 Other long term (current) drug therapy: Secondary | ICD-10-CM | POA: Diagnosis not present

## 2020-05-19 DIAGNOSIS — R9439 Abnormal result of other cardiovascular function study: Secondary | ICD-10-CM

## 2020-05-19 DIAGNOSIS — J449 Chronic obstructive pulmonary disease, unspecified: Secondary | ICD-10-CM | POA: Diagnosis not present

## 2020-05-19 DIAGNOSIS — F17200 Nicotine dependence, unspecified, uncomplicated: Secondary | ICD-10-CM | POA: Insufficient documentation

## 2020-05-19 DIAGNOSIS — I2510 Atherosclerotic heart disease of native coronary artery without angina pectoris: Secondary | ICD-10-CM | POA: Diagnosis not present

## 2020-05-19 DIAGNOSIS — I25119 Atherosclerotic heart disease of native coronary artery with unspecified angina pectoris: Secondary | ICD-10-CM | POA: Diagnosis not present

## 2020-05-19 HISTORY — PX: CORONARY PRESSURE/FFR STUDY: CATH118243

## 2020-05-19 HISTORY — PX: LEFT HEART CATH AND CORONARY ANGIOGRAPHY: CATH118249

## 2020-05-19 SURGERY — LEFT HEART CATH AND CORONARY ANGIOGRAPHY
Anesthesia: Moderate Sedation

## 2020-05-19 MED ORDER — SODIUM CHLORIDE 0.9% FLUSH: 3.0000 mL | Freq: Two times a day (BID) | INTRAVENOUS | Status: DC

## 2020-05-19 MED ORDER — HYDRALAZINE HCL 20 MG/ML IJ SOLN: 10.0000 mg | INTRAMUSCULAR | Status: DC | PRN

## 2020-05-19 MED ORDER — ACETAMINOPHEN 325 MG PO TABS: 650.0000 mg | ORAL_TABLET | ORAL | Status: DC | PRN

## 2020-05-19 MED ORDER — LIDOCAINE HCL (PF) 1 % IJ SOLN
INTRAMUSCULAR | Status: AC
  Filled 2020-05-19: qty 30

## 2020-05-19 MED ORDER — ONDANSETRON HCL 4 MG/2ML IJ SOLN: 4.0000 mg | Freq: Four times a day (QID) | INTRAMUSCULAR | Status: DC | PRN

## 2020-05-19 MED ORDER — SODIUM CHLORIDE 0.9% FLUSH: 3.0000 mL | INTRAVENOUS | Status: DC | PRN

## 2020-05-19 MED ORDER — FENTANYL CITRATE (PF) 100 MCG/2ML IJ SOLN
INTRAMUSCULAR | Status: DC | PRN
Start: 2020-05-19 — End: 2020-05-19
  Administered 2020-05-19: 25 ug via INTRAVENOUS

## 2020-05-19 MED ORDER — HEPARIN SODIUM (PORCINE) 1000 UNIT/ML IJ SOLN
INTRAMUSCULAR | Status: AC
  Filled 2020-05-19: qty 1

## 2020-05-19 MED ORDER — ASPIRIN 81 MG PO CHEW
81.0000 mg | CHEWABLE_TABLET | ORAL | Status: AC
  Administered 2020-05-19: 81 mg via ORAL

## 2020-05-19 MED ORDER — IOHEXOL 300 MG/ML  SOLN
INTRAMUSCULAR | Status: DC | PRN
  Administered 2020-05-19: 170 mL

## 2020-05-19 MED ORDER — VERAPAMIL HCL 2.5 MG/ML IV SOLN
INTRAVENOUS | Status: AC
  Filled 2020-05-19: qty 2

## 2020-05-19 MED ORDER — SODIUM CHLORIDE 0.9 % WEIGHT BASED INFUSION: 1.0000 mL/kg/h | INTRAVENOUS | Status: DC

## 2020-05-19 MED ORDER — SODIUM CHLORIDE 0.9 % WEIGHT BASED INFUSION
INTRAVENOUS | Status: AC
  Administered 2020-05-19: 1000 mL via INTRAVENOUS

## 2020-05-19 MED ORDER — SODIUM CHLORIDE 0.9 % IV SOLN: 250.0000 mL | INTRAVENOUS | Status: DC | PRN

## 2020-05-19 MED ORDER — HEPARIN (PORCINE) IN NACL 1000-0.9 UT/500ML-% IV SOLN
INTRAVENOUS | Status: AC
  Filled 2020-05-19: qty 1000

## 2020-05-19 MED ORDER — MIDAZOLAM HCL 2 MG/2ML IJ SOLN
INTRAMUSCULAR | Status: AC
  Filled 2020-05-19: qty 2

## 2020-05-19 MED ORDER — SODIUM CHLORIDE 0.9 % WEIGHT BASED INFUSION: INTRAVENOUS | Status: DC

## 2020-05-19 MED ORDER — HEPARIN (PORCINE) IN NACL 1000-0.9 UT/500ML-% IV SOLN
INTRAVENOUS | Status: DC | PRN
  Administered 2020-05-19: 500 mL via INTRACORONARY

## 2020-05-19 MED ORDER — MIDAZOLAM HCL 2 MG/2ML IJ SOLN
INTRAMUSCULAR | Status: DC | PRN
  Administered 2020-05-19: 1 mg via INTRAVENOUS

## 2020-05-19 MED ORDER — ASPIRIN 81 MG PO CHEW
CHEWABLE_TABLET | ORAL | Status: AC
  Filled 2020-05-19: qty 1

## 2020-05-19 MED ORDER — FENTANYL CITRATE (PF) 100 MCG/2ML IJ SOLN
INTRAMUSCULAR | Status: AC
  Filled 2020-05-19: qty 2

## 2020-05-19 MED ORDER — VERAPAMIL HCL 2.5 MG/ML IV SOLN
INTRAVENOUS | Status: DC | PRN
  Administered 2020-05-19: 2.5 mg via INTRA_ARTERIAL

## 2020-05-19 MED ORDER — LABETALOL HCL 5 MG/ML IV SOLN: 10.0000 mg | INTRAVENOUS | Status: DC | PRN

## 2020-05-19 MED ORDER — HEPARIN SODIUM (PORCINE) 1000 UNIT/ML IJ SOLN
INTRAMUSCULAR | Status: DC | PRN
  Administered 2020-05-19: 3500 [IU] via INTRAVENOUS
  Administered 2020-05-19: 7000 [IU] via INTRAVENOUS

## 2020-05-19 SURGICAL SUPPLY — 12 items
CATH 5F 110X4 TIG (CATHETERS) ×4 IMPLANT
CATH INFINITI JR4 5F (CATHETERS) ×4 IMPLANT
CATH VISTA GUIDE 6FR XB3.5 (CATHETERS) ×4 IMPLANT
DEVICE INFLAT 30 PLUS (MISCELLANEOUS) ×4 IMPLANT
DEVICE RAD TR BAND REGULAR (VASCULAR PRODUCTS) ×4 IMPLANT
GLIDESHEATH SLEND SS 6F .021 (SHEATH) ×4 IMPLANT
GUIDEWIRE INQWIRE 1.5J.035X260 (WIRE) ×2 IMPLANT
GUIDEWIRE PRESS OMNI 185 ST (WIRE) ×4 IMPLANT
INQWIRE 1.5J .035X260CM (WIRE) ×4
KIT MANI 3VAL PERCEP (MISCELLANEOUS) ×4 IMPLANT
PACK CARDIAC CATH (CUSTOM PROCEDURE TRAY) ×4 IMPLANT
WIRE ASAHI PROWATER 180CM (WIRE) ×4 IMPLANT

## 2020-05-20 LAB — POCT ACTIVATED CLOTTING TIME: Activated Clotting Time: 268 seconds

## 2020-05-27 DIAGNOSIS — R079 Chest pain, unspecified: Secondary | ICD-10-CM | POA: Diagnosis not present

## 2020-05-27 DIAGNOSIS — R0602 Shortness of breath: Secondary | ICD-10-CM | POA: Diagnosis not present

## 2020-05-27 DIAGNOSIS — I712 Thoracic aortic aneurysm, without rupture: Secondary | ICD-10-CM | POA: Diagnosis not present

## 2020-05-27 DIAGNOSIS — I10 Essential (primary) hypertension: Secondary | ICD-10-CM | POA: Diagnosis not present

## 2020-05-27 DIAGNOSIS — Z902 Acquired absence of lung [part of]: Secondary | ICD-10-CM | POA: Diagnosis not present

## 2020-05-27 DIAGNOSIS — Z720 Tobacco use: Secondary | ICD-10-CM | POA: Diagnosis not present

## 2020-05-27 DIAGNOSIS — J418 Mixed simple and mucopurulent chronic bronchitis: Secondary | ICD-10-CM | POA: Diagnosis not present

## 2020-06-17 DIAGNOSIS — R079 Chest pain, unspecified: Secondary | ICD-10-CM | POA: Diagnosis not present

## 2020-06-17 DIAGNOSIS — Z720 Tobacco use: Secondary | ICD-10-CM | POA: Diagnosis not present

## 2020-06-17 DIAGNOSIS — I10 Essential (primary) hypertension: Secondary | ICD-10-CM | POA: Diagnosis not present

## 2020-06-17 DIAGNOSIS — Z902 Acquired absence of lung [part of]: Secondary | ICD-10-CM | POA: Diagnosis not present

## 2020-06-17 DIAGNOSIS — R0602 Shortness of breath: Secondary | ICD-10-CM | POA: Diagnosis not present

## 2020-06-17 DIAGNOSIS — J418 Mixed simple and mucopurulent chronic bronchitis: Secondary | ICD-10-CM | POA: Diagnosis not present

## 2020-06-17 DIAGNOSIS — I712 Thoracic aortic aneurysm, without rupture: Secondary | ICD-10-CM | POA: Diagnosis not present

## 2020-06-25 DIAGNOSIS — I10 Essential (primary) hypertension: Secondary | ICD-10-CM | POA: Diagnosis not present

## 2020-06-25 DIAGNOSIS — J440 Chronic obstructive pulmonary disease with acute lower respiratory infection: Secondary | ICD-10-CM | POA: Diagnosis not present

## 2020-06-25 DIAGNOSIS — J209 Acute bronchitis, unspecified: Secondary | ICD-10-CM | POA: Diagnosis not present

## 2020-09-15 DIAGNOSIS — C3490 Malignant neoplasm of unspecified part of unspecified bronchus or lung: Secondary | ICD-10-CM | POA: Diagnosis not present

## 2020-09-15 DIAGNOSIS — Z902 Acquired absence of lung [part of]: Secondary | ICD-10-CM | POA: Diagnosis not present

## 2020-09-15 DIAGNOSIS — J418 Mixed simple and mucopurulent chronic bronchitis: Secondary | ICD-10-CM | POA: Diagnosis not present

## 2020-09-15 DIAGNOSIS — I10 Essential (primary) hypertension: Secondary | ICD-10-CM | POA: Diagnosis not present

## 2020-09-15 DIAGNOSIS — Z125 Encounter for screening for malignant neoplasm of prostate: Secondary | ICD-10-CM | POA: Diagnosis not present

## 2020-09-15 DIAGNOSIS — R079 Chest pain, unspecified: Secondary | ICD-10-CM | POA: Diagnosis not present

## 2020-09-15 DIAGNOSIS — Z720 Tobacco use: Secondary | ICD-10-CM | POA: Diagnosis not present

## 2020-09-15 DIAGNOSIS — Z0000 Encounter for general adult medical examination without abnormal findings: Secondary | ICD-10-CM | POA: Diagnosis not present

## 2020-09-17 DIAGNOSIS — R0602 Shortness of breath: Secondary | ICD-10-CM | POA: Diagnosis not present

## 2020-09-17 DIAGNOSIS — I10 Essential (primary) hypertension: Secondary | ICD-10-CM | POA: Diagnosis not present

## 2020-09-17 DIAGNOSIS — Z720 Tobacco use: Secondary | ICD-10-CM | POA: Diagnosis not present

## 2020-09-17 DIAGNOSIS — R079 Chest pain, unspecified: Secondary | ICD-10-CM | POA: Diagnosis not present

## 2020-09-22 DIAGNOSIS — I77819 Aortic ectasia, unspecified site: Secondary | ICD-10-CM | POA: Diagnosis not present

## 2020-09-22 DIAGNOSIS — G8912 Acute post-thoracotomy pain: Secondary | ICD-10-CM | POA: Diagnosis not present

## 2020-09-22 DIAGNOSIS — I10 Essential (primary) hypertension: Secondary | ICD-10-CM | POA: Diagnosis not present

## 2020-09-22 DIAGNOSIS — Z0000 Encounter for general adult medical examination without abnormal findings: Secondary | ICD-10-CM | POA: Diagnosis not present

## 2020-09-22 DIAGNOSIS — F4321 Adjustment disorder with depressed mood: Secondary | ICD-10-CM | POA: Diagnosis not present

## 2020-09-22 DIAGNOSIS — F32A Depression, unspecified: Secondary | ICD-10-CM | POA: Diagnosis not present

## 2020-09-22 DIAGNOSIS — I700 Atherosclerosis of aorta: Secondary | ICD-10-CM | POA: Diagnosis not present

## 2020-09-22 DIAGNOSIS — D696 Thrombocytopenia, unspecified: Secondary | ICD-10-CM | POA: Diagnosis not present

## 2020-09-22 DIAGNOSIS — J479 Bronchiectasis, uncomplicated: Secondary | ICD-10-CM | POA: Diagnosis not present

## 2020-09-22 DIAGNOSIS — F419 Anxiety disorder, unspecified: Secondary | ICD-10-CM | POA: Diagnosis not present

## 2020-09-29 DIAGNOSIS — M5414 Radiculopathy, thoracic region: Secondary | ICD-10-CM | POA: Diagnosis not present

## 2020-09-29 DIAGNOSIS — Z9889 Other specified postprocedural states: Secondary | ICD-10-CM | POA: Diagnosis not present

## 2020-09-29 DIAGNOSIS — S22050A Wedge compression fracture of T5-T6 vertebra, initial encounter for closed fracture: Secondary | ICD-10-CM | POA: Diagnosis not present

## 2020-09-29 DIAGNOSIS — M546 Pain in thoracic spine: Secondary | ICD-10-CM | POA: Diagnosis not present

## 2020-10-07 ENCOUNTER — Other Ambulatory Visit: Payer: Self-pay | Admitting: Orthopedic Surgery

## 2020-10-07 DIAGNOSIS — M546 Pain in thoracic spine: Secondary | ICD-10-CM

## 2020-10-07 DIAGNOSIS — M5414 Radiculopathy, thoracic region: Secondary | ICD-10-CM

## 2020-10-07 DIAGNOSIS — S22050A Wedge compression fracture of T5-T6 vertebra, initial encounter for closed fracture: Secondary | ICD-10-CM

## 2020-10-07 DIAGNOSIS — Z9889 Other specified postprocedural states: Secondary | ICD-10-CM

## 2020-10-16 ENCOUNTER — Ambulatory Visit
Admission: RE | Admit: 2020-10-16 | Discharge: 2020-10-16 | Disposition: A | Discharge status: Home / Self Care | Payer: Medicare HMO | Source: Ambulatory Visit | Attending: Orthopedic Surgery | Admitting: Orthopedic Surgery

## 2020-10-16 ENCOUNTER — Other Ambulatory Visit: Payer: Self-pay

## 2020-10-16 DIAGNOSIS — M5414 Radiculopathy, thoracic region: Secondary | ICD-10-CM | POA: Diagnosis not present

## 2020-10-16 DIAGNOSIS — S22060A Wedge compression fracture of T7-T8 vertebra, initial encounter for closed fracture: Secondary | ICD-10-CM | POA: Diagnosis not present

## 2020-10-16 DIAGNOSIS — M546 Pain in thoracic spine: Secondary | ICD-10-CM

## 2020-10-16 DIAGNOSIS — S22050A Wedge compression fracture of T5-T6 vertebra, initial encounter for closed fracture: Secondary | ICD-10-CM | POA: Diagnosis not present

## 2020-10-16 DIAGNOSIS — Z9889 Other specified postprocedural states: Secondary | ICD-10-CM

## 2020-10-16 DIAGNOSIS — M40204 Unspecified kyphosis, thoracic region: Secondary | ICD-10-CM | POA: Diagnosis not present

## 2021-01-14 DIAGNOSIS — I700 Atherosclerosis of aorta: Secondary | ICD-10-CM | POA: Diagnosis not present

## 2021-01-14 DIAGNOSIS — Z902 Acquired absence of lung [part of]: Secondary | ICD-10-CM | POA: Diagnosis not present

## 2021-01-14 DIAGNOSIS — I712 Thoracic aortic aneurysm, without rupture: Secondary | ICD-10-CM | POA: Diagnosis not present

## 2021-01-14 DIAGNOSIS — Z720 Tobacco use: Secondary | ICD-10-CM | POA: Diagnosis not present

## 2021-01-14 DIAGNOSIS — C3490 Malignant neoplasm of unspecified part of unspecified bronchus or lung: Secondary | ICD-10-CM | POA: Diagnosis not present

## 2021-01-14 DIAGNOSIS — I10 Essential (primary) hypertension: Secondary | ICD-10-CM | POA: Diagnosis not present

## 2021-01-14 DIAGNOSIS — G8912 Acute post-thoracotomy pain: Secondary | ICD-10-CM | POA: Diagnosis not present

## 2021-01-14 DIAGNOSIS — G894 Chronic pain syndrome: Secondary | ICD-10-CM | POA: Diagnosis not present

## 2021-01-14 DIAGNOSIS — F411 Generalized anxiety disorder: Secondary | ICD-10-CM | POA: Diagnosis not present

## 2021-01-18 DIAGNOSIS — R0602 Shortness of breath: Secondary | ICD-10-CM | POA: Diagnosis not present

## 2021-01-18 DIAGNOSIS — I10 Essential (primary) hypertension: Secondary | ICD-10-CM | POA: Diagnosis not present

## 2021-01-18 DIAGNOSIS — I2510 Atherosclerotic heart disease of native coronary artery without angina pectoris: Secondary | ICD-10-CM | POA: Diagnosis not present

## 2021-01-18 DIAGNOSIS — Z720 Tobacco use: Secondary | ICD-10-CM | POA: Diagnosis not present

## 2021-01-20 DIAGNOSIS — I2510 Atherosclerotic heart disease of native coronary artery without angina pectoris: Secondary | ICD-10-CM | POA: Insufficient documentation

## 2021-01-21 DIAGNOSIS — F411 Generalized anxiety disorder: Secondary | ICD-10-CM | POA: Diagnosis not present

## 2021-01-21 DIAGNOSIS — Z23 Encounter for immunization: Secondary | ICD-10-CM | POA: Diagnosis not present

## 2021-01-21 DIAGNOSIS — I2510 Atherosclerotic heart disease of native coronary artery without angina pectoris: Secondary | ICD-10-CM | POA: Diagnosis not present

## 2021-01-21 DIAGNOSIS — Z902 Acquired absence of lung [part of]: Secondary | ICD-10-CM | POA: Diagnosis not present

## 2021-01-21 DIAGNOSIS — J470 Bronchiectasis with acute lower respiratory infection: Secondary | ICD-10-CM | POA: Diagnosis not present

## 2021-01-21 DIAGNOSIS — F17210 Nicotine dependence, cigarettes, uncomplicated: Secondary | ICD-10-CM | POA: Diagnosis not present

## 2021-01-21 DIAGNOSIS — G894 Chronic pain syndrome: Secondary | ICD-10-CM | POA: Diagnosis not present

## 2021-03-30 DIAGNOSIS — U071 COVID-19: Secondary | ICD-10-CM | POA: Diagnosis not present

## 2021-03-30 DIAGNOSIS — I77810 Thoracic aortic ectasia: Secondary | ICD-10-CM | POA: Diagnosis not present

## 2021-03-30 DIAGNOSIS — I10 Essential (primary) hypertension: Secondary | ICD-10-CM | POA: Diagnosis not present

## 2021-03-30 DIAGNOSIS — J470 Bronchiectasis with acute lower respiratory infection: Secondary | ICD-10-CM | POA: Diagnosis not present

## 2021-03-30 DIAGNOSIS — J418 Mixed simple and mucopurulent chronic bronchitis: Secondary | ICD-10-CM | POA: Diagnosis not present

## 2021-03-30 DIAGNOSIS — G8912 Acute post-thoracotomy pain: Secondary | ICD-10-CM | POA: Diagnosis not present

## 2021-03-30 DIAGNOSIS — I2510 Atherosclerotic heart disease of native coronary artery without angina pectoris: Secondary | ICD-10-CM | POA: Diagnosis not present

## 2021-03-30 DIAGNOSIS — J209 Acute bronchitis, unspecified: Secondary | ICD-10-CM | POA: Diagnosis not present

## 2021-03-30 DIAGNOSIS — Z720 Tobacco use: Secondary | ICD-10-CM | POA: Diagnosis not present

## 2021-04-06 ENCOUNTER — Other Ambulatory Visit: Payer: Self-pay | Admitting: Internal Medicine

## 2021-04-06 ENCOUNTER — Other Ambulatory Visit (HOSPITAL_COMMUNITY): Payer: Self-pay | Admitting: Internal Medicine

## 2021-04-06 DIAGNOSIS — R590 Localized enlarged lymph nodes: Secondary | ICD-10-CM

## 2021-04-06 DIAGNOSIS — M792 Neuralgia and neuritis, unspecified: Secondary | ICD-10-CM

## 2021-04-06 DIAGNOSIS — Z720 Tobacco use: Secondary | ICD-10-CM

## 2021-04-09 ENCOUNTER — Other Ambulatory Visit: Payer: Self-pay | Admitting: Orthopedic Surgery

## 2021-04-09 DIAGNOSIS — S22000A Wedge compression fracture of unspecified thoracic vertebra, initial encounter for closed fracture: Secondary | ICD-10-CM | POA: Diagnosis not present

## 2021-04-09 DIAGNOSIS — M546 Pain in thoracic spine: Secondary | ICD-10-CM | POA: Diagnosis not present

## 2021-04-09 DIAGNOSIS — Z9889 Other specified postprocedural states: Secondary | ICD-10-CM | POA: Diagnosis not present

## 2021-04-10 ENCOUNTER — Ambulatory Visit
Admission: RE | Admit: 2021-04-10 | Discharge: 2021-04-10 | Disposition: A | Discharge status: Home / Self Care | Payer: Medicare HMO | Source: Ambulatory Visit | Attending: Orthopedic Surgery | Admitting: Orthopedic Surgery

## 2021-04-10 ENCOUNTER — Other Ambulatory Visit: Payer: Self-pay

## 2021-04-10 DIAGNOSIS — S22000A Wedge compression fracture of unspecified thoracic vertebra, initial encounter for closed fracture: Secondary | ICD-10-CM | POA: Diagnosis not present

## 2021-04-10 DIAGNOSIS — S299XXA Unspecified injury of thorax, initial encounter: Secondary | ICD-10-CM | POA: Diagnosis not present

## 2021-04-10 DIAGNOSIS — M50223 Other cervical disc displacement at C6-C7 level: Secondary | ICD-10-CM | POA: Diagnosis not present

## 2021-04-10 DIAGNOSIS — M40204 Unspecified kyphosis, thoracic region: Secondary | ICD-10-CM | POA: Diagnosis not present

## 2021-04-23 ENCOUNTER — Ambulatory Visit
Admission: RE | Admit: 2021-04-23 | Discharge: 2021-04-23 | Disposition: A | Discharge status: Home / Self Care | Payer: Medicare HMO | Source: Ambulatory Visit | Attending: Internal Medicine | Admitting: Internal Medicine

## 2021-04-23 ENCOUNTER — Other Ambulatory Visit: Payer: Self-pay

## 2021-04-23 DIAGNOSIS — R590 Localized enlarged lymph nodes: Secondary | ICD-10-CM | POA: Diagnosis not present

## 2021-04-23 DIAGNOSIS — M792 Neuralgia and neuritis, unspecified: Secondary | ICD-10-CM | POA: Insufficient documentation

## 2021-04-23 DIAGNOSIS — Z720 Tobacco use: Secondary | ICD-10-CM | POA: Insufficient documentation

## 2021-04-23 DIAGNOSIS — I700 Atherosclerosis of aorta: Secondary | ICD-10-CM | POA: Diagnosis not present

## 2021-04-23 DIAGNOSIS — J479 Bronchiectasis, uncomplicated: Secondary | ICD-10-CM | POA: Diagnosis not present

## 2021-04-23 DIAGNOSIS — J189 Pneumonia, unspecified organism: Secondary | ICD-10-CM | POA: Diagnosis not present

## 2021-04-23 DIAGNOSIS — J439 Emphysema, unspecified: Secondary | ICD-10-CM | POA: Diagnosis not present

## 2021-04-29 DIAGNOSIS — S22000A Wedge compression fracture of unspecified thoracic vertebra, initial encounter for closed fracture: Secondary | ICD-10-CM | POA: Diagnosis not present

## 2021-05-14 DIAGNOSIS — R300 Dysuria: Secondary | ICD-10-CM | POA: Diagnosis not present

## 2021-05-14 DIAGNOSIS — N410 Acute prostatitis: Secondary | ICD-10-CM | POA: Diagnosis not present

## 2021-05-25 DIAGNOSIS — F411 Generalized anxiety disorder: Secondary | ICD-10-CM | POA: Diagnosis not present

## 2021-05-25 DIAGNOSIS — G894 Chronic pain syndrome: Secondary | ICD-10-CM | POA: Diagnosis not present

## 2021-05-25 DIAGNOSIS — D649 Anemia, unspecified: Secondary | ICD-10-CM | POA: Diagnosis not present

## 2021-05-25 DIAGNOSIS — Z720 Tobacco use: Secondary | ICD-10-CM | POA: Diagnosis not present

## 2021-05-25 DIAGNOSIS — C3490 Malignant neoplasm of unspecified part of unspecified bronchus or lung: Secondary | ICD-10-CM | POA: Diagnosis not present

## 2021-05-25 DIAGNOSIS — J418 Mixed simple and mucopurulent chronic bronchitis: Secondary | ICD-10-CM | POA: Diagnosis not present

## 2021-05-25 DIAGNOSIS — I2510 Atherosclerotic heart disease of native coronary artery without angina pectoris: Secondary | ICD-10-CM | POA: Diagnosis not present

## 2021-05-25 DIAGNOSIS — Z902 Acquired absence of lung [part of]: Secondary | ICD-10-CM | POA: Diagnosis not present

## 2021-06-01 DIAGNOSIS — J470 Bronchiectasis with acute lower respiratory infection: Secondary | ICD-10-CM | POA: Diagnosis not present

## 2021-06-01 DIAGNOSIS — I10 Essential (primary) hypertension: Secondary | ICD-10-CM | POA: Diagnosis not present

## 2021-06-01 DIAGNOSIS — Z23 Encounter for immunization: Secondary | ICD-10-CM | POA: Diagnosis not present

## 2021-06-01 DIAGNOSIS — Z0000 Encounter for general adult medical examination without abnormal findings: Secondary | ICD-10-CM | POA: Diagnosis not present

## 2021-06-01 DIAGNOSIS — Z85118 Personal history of other malignant neoplasm of bronchus and lung: Secondary | ICD-10-CM | POA: Diagnosis not present

## 2021-06-01 DIAGNOSIS — I700 Atherosclerosis of aorta: Secondary | ICD-10-CM | POA: Diagnosis not present

## 2021-06-01 DIAGNOSIS — F419 Anxiety disorder, unspecified: Secondary | ICD-10-CM | POA: Diagnosis not present

## 2021-06-01 DIAGNOSIS — G894 Chronic pain syndrome: Secondary | ICD-10-CM | POA: Diagnosis not present

## 2021-06-01 DIAGNOSIS — F17210 Nicotine dependence, cigarettes, uncomplicated: Secondary | ICD-10-CM | POA: Diagnosis not present

## 2021-08-08 DIAGNOSIS — I219 Acute myocardial infarction, unspecified: Secondary | ICD-10-CM

## 2021-08-08 HISTORY — DX: Acute myocardial infarction, unspecified: I21.9

## 2021-08-27 DIAGNOSIS — M5414 Radiculopathy, thoracic region: Secondary | ICD-10-CM | POA: Diagnosis not present

## 2021-08-27 DIAGNOSIS — G893 Neoplasm related pain (acute) (chronic): Secondary | ICD-10-CM | POA: Diagnosis not present

## 2021-08-27 DIAGNOSIS — S22060D Wedge compression fracture of T7-T8 vertebra, subsequent encounter for fracture with routine healing: Secondary | ICD-10-CM | POA: Diagnosis not present

## 2021-08-27 DIAGNOSIS — R0789 Other chest pain: Secondary | ICD-10-CM | POA: Diagnosis not present

## 2021-08-27 DIAGNOSIS — G588 Other specified mononeuropathies: Secondary | ICD-10-CM | POA: Diagnosis not present

## 2021-09-03 DIAGNOSIS — M62838 Other muscle spasm: Secondary | ICD-10-CM | POA: Diagnosis not present

## 2021-09-03 DIAGNOSIS — R0789 Other chest pain: Secondary | ICD-10-CM | POA: Diagnosis not present

## 2021-09-03 DIAGNOSIS — G580 Intercostal neuropathy: Secondary | ICD-10-CM | POA: Diagnosis not present

## 2021-09-03 DIAGNOSIS — G893 Neoplasm related pain (acute) (chronic): Secondary | ICD-10-CM | POA: Diagnosis not present

## 2021-09-20 DIAGNOSIS — J418 Mixed simple and mucopurulent chronic bronchitis: Secondary | ICD-10-CM | POA: Diagnosis not present

## 2021-09-20 DIAGNOSIS — G894 Chronic pain syndrome: Secondary | ICD-10-CM | POA: Diagnosis not present

## 2021-09-20 DIAGNOSIS — Z0000 Encounter for general adult medical examination without abnormal findings: Secondary | ICD-10-CM | POA: Diagnosis not present

## 2021-09-20 DIAGNOSIS — Z720 Tobacco use: Secondary | ICD-10-CM | POA: Diagnosis not present

## 2021-09-20 DIAGNOSIS — Z125 Encounter for screening for malignant neoplasm of prostate: Secondary | ICD-10-CM | POA: Diagnosis not present

## 2021-09-20 DIAGNOSIS — I700 Atherosclerosis of aorta: Secondary | ICD-10-CM | POA: Diagnosis not present

## 2021-09-20 DIAGNOSIS — I10 Essential (primary) hypertension: Secondary | ICD-10-CM | POA: Diagnosis not present

## 2021-09-20 DIAGNOSIS — R7309 Other abnormal glucose: Secondary | ICD-10-CM | POA: Diagnosis not present

## 2021-09-20 DIAGNOSIS — F411 Generalized anxiety disorder: Secondary | ICD-10-CM | POA: Diagnosis not present

## 2021-09-27 DIAGNOSIS — J470 Bronchiectasis with acute lower respiratory infection: Secondary | ICD-10-CM | POA: Diagnosis not present

## 2021-09-27 DIAGNOSIS — I10 Essential (primary) hypertension: Secondary | ICD-10-CM | POA: Diagnosis not present

## 2021-09-27 DIAGNOSIS — Z1389 Encounter for screening for other disorder: Secondary | ICD-10-CM | POA: Diagnosis not present

## 2021-09-27 DIAGNOSIS — F17210 Nicotine dependence, cigarettes, uncomplicated: Secondary | ICD-10-CM | POA: Diagnosis not present

## 2021-09-27 DIAGNOSIS — G894 Chronic pain syndrome: Secondary | ICD-10-CM | POA: Diagnosis not present

## 2021-09-27 DIAGNOSIS — Z0000 Encounter for general adult medical examination without abnormal findings: Secondary | ICD-10-CM | POA: Diagnosis not present

## 2021-09-27 DIAGNOSIS — R252 Cramp and spasm: Secondary | ICD-10-CM | POA: Diagnosis not present

## 2021-09-27 DIAGNOSIS — F4321 Adjustment disorder with depressed mood: Secondary | ICD-10-CM | POA: Diagnosis not present

## 2021-09-27 DIAGNOSIS — J209 Acute bronchitis, unspecified: Secondary | ICD-10-CM | POA: Diagnosis not present

## 2021-10-01 DIAGNOSIS — G893 Neoplasm related pain (acute) (chronic): Secondary | ICD-10-CM | POA: Diagnosis not present

## 2021-10-01 DIAGNOSIS — S22060D Wedge compression fracture of T7-T8 vertebra, subsequent encounter for fracture with routine healing: Secondary | ICD-10-CM | POA: Diagnosis not present

## 2021-10-01 DIAGNOSIS — R0789 Other chest pain: Secondary | ICD-10-CM | POA: Diagnosis not present

## 2021-11-01 DIAGNOSIS — J209 Acute bronchitis, unspecified: Secondary | ICD-10-CM | POA: Diagnosis not present

## 2021-11-01 DIAGNOSIS — I10 Essential (primary) hypertension: Secondary | ICD-10-CM | POA: Diagnosis not present

## 2021-11-01 DIAGNOSIS — R21 Rash and other nonspecific skin eruption: Secondary | ICD-10-CM | POA: Diagnosis not present

## 2021-11-01 DIAGNOSIS — J440 Chronic obstructive pulmonary disease with acute lower respiratory infection: Secondary | ICD-10-CM | POA: Diagnosis not present

## 2022-01-18 DIAGNOSIS — R7309 Other abnormal glucose: Secondary | ICD-10-CM | POA: Diagnosis not present

## 2022-01-18 DIAGNOSIS — R252 Cramp and spasm: Secondary | ICD-10-CM | POA: Diagnosis not present

## 2022-01-18 DIAGNOSIS — F330 Major depressive disorder, recurrent, mild: Secondary | ICD-10-CM | POA: Diagnosis not present

## 2022-01-18 DIAGNOSIS — I700 Atherosclerosis of aorta: Secondary | ICD-10-CM | POA: Diagnosis not present

## 2022-01-18 DIAGNOSIS — Z720 Tobacco use: Secondary | ICD-10-CM | POA: Diagnosis not present

## 2022-01-18 DIAGNOSIS — I10 Essential (primary) hypertension: Secondary | ICD-10-CM | POA: Diagnosis not present

## 2022-01-18 DIAGNOSIS — J440 Chronic obstructive pulmonary disease with acute lower respiratory infection: Secondary | ICD-10-CM | POA: Diagnosis not present

## 2022-01-18 DIAGNOSIS — G894 Chronic pain syndrome: Secondary | ICD-10-CM | POA: Diagnosis not present

## 2022-01-18 DIAGNOSIS — R079 Chest pain, unspecified: Secondary | ICD-10-CM | POA: Diagnosis not present

## 2022-01-25 DIAGNOSIS — J470 Bronchiectasis with acute lower respiratory infection: Secondary | ICD-10-CM | POA: Diagnosis not present

## 2022-01-25 DIAGNOSIS — I77819 Aortic ectasia, unspecified site: Secondary | ICD-10-CM | POA: Diagnosis not present

## 2022-01-25 DIAGNOSIS — G8912 Acute post-thoracotomy pain: Secondary | ICD-10-CM | POA: Diagnosis not present

## 2022-01-25 DIAGNOSIS — I700 Atherosclerosis of aorta: Secondary | ICD-10-CM | POA: Diagnosis not present

## 2022-01-25 DIAGNOSIS — Z902 Acquired absence of lung [part of]: Secondary | ICD-10-CM | POA: Diagnosis not present

## 2022-01-25 DIAGNOSIS — F17210 Nicotine dependence, cigarettes, uncomplicated: Secondary | ICD-10-CM | POA: Diagnosis not present

## 2022-01-25 DIAGNOSIS — Z85118 Personal history of other malignant neoplasm of bronchus and lung: Secondary | ICD-10-CM | POA: Diagnosis not present

## 2022-01-25 DIAGNOSIS — F4321 Adjustment disorder with depressed mood: Secondary | ICD-10-CM | POA: Diagnosis not present

## 2022-02-14 DIAGNOSIS — H5213 Myopia, bilateral: Secondary | ICD-10-CM | POA: Diagnosis not present

## 2022-02-15 DIAGNOSIS — H524 Presbyopia: Secondary | ICD-10-CM | POA: Diagnosis not present

## 2022-04-05 DIAGNOSIS — R1011 Right upper quadrant pain: Secondary | ICD-10-CM | POA: Diagnosis not present

## 2022-04-05 DIAGNOSIS — I700 Atherosclerosis of aorta: Secondary | ICD-10-CM | POA: Diagnosis not present

## 2022-04-05 DIAGNOSIS — R195 Other fecal abnormalities: Secondary | ICD-10-CM | POA: Diagnosis not present

## 2022-04-05 DIAGNOSIS — J449 Chronic obstructive pulmonary disease, unspecified: Secondary | ICD-10-CM | POA: Diagnosis not present

## 2022-04-05 DIAGNOSIS — Z85118 Personal history of other malignant neoplasm of bronchus and lung: Secondary | ICD-10-CM | POA: Diagnosis not present

## 2022-04-05 DIAGNOSIS — I10 Essential (primary) hypertension: Secondary | ICD-10-CM | POA: Diagnosis not present

## 2022-04-05 DIAGNOSIS — Z1211 Encounter for screening for malignant neoplasm of colon: Secondary | ICD-10-CM | POA: Diagnosis not present

## 2022-04-05 DIAGNOSIS — F17210 Nicotine dependence, cigarettes, uncomplicated: Secondary | ICD-10-CM | POA: Diagnosis not present

## 2022-04-05 DIAGNOSIS — F419 Anxiety disorder, unspecified: Secondary | ICD-10-CM | POA: Diagnosis not present

## 2022-05-10 ENCOUNTER — Inpatient Hospital Stay
Admission: EM | Admit: 2022-05-10 | Discharge: 2022-05-12 | DRG: 282 | Disposition: A | Discharge status: Home / Self Care | Payer: Medicare HMO | Attending: Osteopathic Medicine | Admitting: Osteopathic Medicine

## 2022-05-10 ENCOUNTER — Other Ambulatory Visit: Payer: Self-pay

## 2022-05-10 ENCOUNTER — Emergency Department: Payer: Medicare HMO

## 2022-05-10 ENCOUNTER — Encounter: Payer: Self-pay | Admitting: Medical Oncology

## 2022-05-10 DIAGNOSIS — I10 Essential (primary) hypertension: Secondary | ICD-10-CM | POA: Diagnosis not present

## 2022-05-10 DIAGNOSIS — Z79899 Other long term (current) drug therapy: Secondary | ICD-10-CM

## 2022-05-10 DIAGNOSIS — J449 Chronic obstructive pulmonary disease, unspecified: Secondary | ICD-10-CM | POA: Diagnosis not present

## 2022-05-10 DIAGNOSIS — R54 Age-related physical debility: Secondary | ICD-10-CM | POA: Diagnosis present

## 2022-05-10 DIAGNOSIS — I214 Non-ST elevation (NSTEMI) myocardial infarction: Secondary | ICD-10-CM | POA: Diagnosis not present

## 2022-05-10 DIAGNOSIS — I712 Thoracic aortic aneurysm, without rupture, unspecified: Secondary | ICD-10-CM | POA: Diagnosis present

## 2022-05-10 DIAGNOSIS — Z7982 Long term (current) use of aspirin: Secondary | ICD-10-CM

## 2022-05-10 DIAGNOSIS — G629 Polyneuropathy, unspecified: Secondary | ICD-10-CM | POA: Diagnosis present

## 2022-05-10 DIAGNOSIS — R079 Chest pain, unspecified: Secondary | ICD-10-CM | POA: Diagnosis not present

## 2022-05-10 DIAGNOSIS — F411 Generalized anxiety disorder: Secondary | ICD-10-CM | POA: Diagnosis present

## 2022-05-10 DIAGNOSIS — Z85118 Personal history of other malignant neoplasm of bronchus and lung: Secondary | ICD-10-CM | POA: Diagnosis not present

## 2022-05-10 DIAGNOSIS — R0602 Shortness of breath: Secondary | ICD-10-CM | POA: Diagnosis not present

## 2022-05-10 DIAGNOSIS — F17210 Nicotine dependence, cigarettes, uncomplicated: Secondary | ICD-10-CM | POA: Diagnosis present

## 2022-05-10 DIAGNOSIS — Z1152 Encounter for screening for COVID-19: Secondary | ICD-10-CM

## 2022-05-10 DIAGNOSIS — I25119 Atherosclerotic heart disease of native coronary artery with unspecified angina pectoris: Secondary | ICD-10-CM | POA: Diagnosis not present

## 2022-05-10 DIAGNOSIS — Z825 Family history of asthma and other chronic lower respiratory diseases: Secondary | ICD-10-CM | POA: Diagnosis not present

## 2022-05-10 DIAGNOSIS — Z720 Tobacco use: Secondary | ICD-10-CM | POA: Diagnosis present

## 2022-05-10 DIAGNOSIS — E785 Hyperlipidemia, unspecified: Secondary | ICD-10-CM | POA: Diagnosis not present

## 2022-05-10 DIAGNOSIS — R0789 Other chest pain: Secondary | ICD-10-CM | POA: Diagnosis present

## 2022-05-10 DIAGNOSIS — Z77090 Contact with and (suspected) exposure to asbestos: Secondary | ICD-10-CM | POA: Diagnosis present

## 2022-05-10 DIAGNOSIS — K219 Gastro-esophageal reflux disease without esophagitis: Secondary | ICD-10-CM | POA: Diagnosis present

## 2022-05-10 DIAGNOSIS — G894 Chronic pain syndrome: Secondary | ICD-10-CM | POA: Diagnosis present

## 2022-05-10 DIAGNOSIS — J418 Mixed simple and mucopurulent chronic bronchitis: Secondary | ICD-10-CM | POA: Diagnosis not present

## 2022-05-10 DIAGNOSIS — I2510 Atherosclerotic heart disease of native coronary artery without angina pectoris: Secondary | ICD-10-CM | POA: Diagnosis present

## 2022-05-10 DIAGNOSIS — C3490 Malignant neoplasm of unspecified part of unspecified bronchus or lung: Secondary | ICD-10-CM | POA: Diagnosis present

## 2022-05-10 LAB — APTT: aPTT: 28 seconds (ref 24–36)

## 2022-05-10 LAB — PROTIME-INR
INR: 1 (ref 0.8–1.2)
Prothrombin Time: 13.4 seconds (ref 11.4–15.2)

## 2022-05-10 LAB — CBC
HCT: 40.1 % (ref 39.0–52.0)
Hemoglobin: 13.1 g/dL (ref 13.0–17.0)
MCH: 32.7 pg (ref 26.0–34.0)
MCHC: 32.7 g/dL (ref 30.0–36.0)
MCV: 100 fL (ref 80.0–100.0)
Platelets: 223 10*3/uL (ref 150–400)
RBC: 4.01 MIL/uL — ABNORMAL LOW (ref 4.22–5.81)
RDW: 15 % (ref 11.5–15.5)
WBC: 9.7 10*3/uL (ref 4.0–10.5)
nRBC: 0 % (ref 0.0–0.2)

## 2022-05-10 LAB — BASIC METABOLIC PANEL
Anion gap: 10 (ref 5–15)
BUN: 13 mg/dL (ref 8–23)
CO2: 26 mmol/L (ref 22–32)
Calcium: 9.1 mg/dL (ref 8.9–10.3)
Chloride: 101 mmol/L (ref 98–111)
Creatinine, Ser: 0.69 mg/dL (ref 0.61–1.24)
GFR, Estimated: >60 mL/min (ref >60)
Glucose, Bld: 104 mg/dL — ABNORMAL HIGH (ref 70–99)
Potassium: 3.7 mmol/L (ref 3.5–5.1)
Sodium: 137 mmol/L (ref 135–145)

## 2022-05-10 LAB — RESP PANEL BY RT-PCR (FLU A&B, COVID) ARPGX2
Influenza A by PCR: NEGATIVE
Influenza B by PCR: NEGATIVE
SARS Coronavirus 2 by RT PCR: NEGATIVE

## 2022-05-10 LAB — TROPONIN I (HIGH SENSITIVITY): Troponin I (High Sensitivity): 1906 ng/L (ref <18)

## 2022-05-10 MED ORDER — NITROGLYCERIN 2 % TD OINT: 1.0000 [in_us] | TOPICAL_OINTMENT | Freq: Four times a day (QID) | TRANSDERMAL | Status: DC | PRN

## 2022-05-10 MED ORDER — SODIUM CHLORIDE 0.9% FLUSH
3.0000 mL | Freq: Two times a day (BID) | INTRAVENOUS | Status: DC
  Administered 2022-05-11 – 2022-05-12 (×2): 3 mL via INTRAVENOUS

## 2022-05-10 MED ORDER — UMECLIDINIUM BROMIDE 62.5 MCG/ACT IN AEPB
1.0000 | INHALATION_SPRAY | Freq: Every day | RESPIRATORY_TRACT | Status: DC
  Administered 2022-05-11 – 2022-05-12 (×2): 1 via RESPIRATORY_TRACT
  Filled 2022-05-10: qty 7

## 2022-05-10 MED ORDER — NICOTINE POLACRILEX 2 MG MT GUM: 2.0000 mg | CHEWING_GUM | OROMUCOSAL | Status: DC | PRN

## 2022-05-10 MED ORDER — HEPARIN BOLUS VIA INFUSION
3800.0000 [IU] | Freq: Once | INTRAVENOUS | Status: AC
  Administered 2022-05-10: 3800 [IU] via INTRAVENOUS
  Filled 2022-05-10: qty 3800

## 2022-05-10 MED ORDER — ONDANSETRON HCL 4 MG/2ML IJ SOLN: 4.0000 mg | Freq: Four times a day (QID) | INTRAMUSCULAR | Status: DC | PRN

## 2022-05-10 MED ORDER — HEPARIN (PORCINE) 25000 UT/250ML-% IV SOLN
950.0000 [IU]/h | INTRAVENOUS | Status: DC
  Administered 2022-05-10: 750 [IU]/h via INTRAVENOUS
  Filled 2022-05-10: qty 250

## 2022-05-10 MED ORDER — ONDANSETRON HCL 4 MG PO TABS: 4.0000 mg | ORAL_TABLET | Freq: Four times a day (QID) | ORAL | Status: DC | PRN

## 2022-05-10 MED ORDER — LABETALOL HCL 5 MG/ML IV SOLN: 5.0000 mg | INTRAVENOUS | Status: DC | PRN

## 2022-05-10 MED ORDER — LORAZEPAM 2 MG/ML IJ SOLN: 0.5000 mg | Freq: Four times a day (QID) | INTRAMUSCULAR | Status: DC | PRN

## 2022-05-10 MED ORDER — SENNOSIDES-DOCUSATE SODIUM 8.6-50 MG PO TABS: 1.0000 | ORAL_TABLET | Freq: Every evening | ORAL | Status: DC | PRN

## 2022-05-10 MED ORDER — ACETAMINOPHEN 650 MG RE SUPP: 650.0000 mg | Freq: Four times a day (QID) | RECTAL | Status: DC | PRN

## 2022-05-10 MED ORDER — ACETAMINOPHEN 325 MG PO TABS: 650.0000 mg | ORAL_TABLET | Freq: Four times a day (QID) | ORAL | Status: DC | PRN

## 2022-05-10 MED ORDER — IPRATROPIUM-ALBUTEROL 0.5-2.5 (3) MG/3ML IN SOLN: 3.0000 mL | Freq: Four times a day (QID) | RESPIRATORY_TRACT | Status: DC | PRN

## 2022-05-10 MED ORDER — MORPHINE SULFATE (PF) 2 MG/ML IV SOLN: 2.0000 mg | INTRAVENOUS | Status: DC | PRN

## 2022-05-10 MED ORDER — GABAPENTIN 400 MG PO CAPS
800.0000 mg | ORAL_CAPSULE | Freq: Two times a day (BID) | ORAL | Status: DC
  Administered 2022-05-10 – 2022-05-12 (×4): 800 mg via ORAL
  Filled 2022-05-10 (×4): qty 2

## 2022-05-10 MED ORDER — FLUTICASONE FUROATE-VILANTEROL 100-25 MCG/ACT IN AEPB
1.0000 | INHALATION_SPRAY | Freq: Every day | RESPIRATORY_TRACT | Status: DC
  Administered 2022-05-11 – 2022-05-12 (×2): 1 via RESPIRATORY_TRACT
  Filled 2022-05-10: qty 28

## 2022-05-10 MED ORDER — ATORVASTATIN CALCIUM 80 MG PO TABS
80.0000 mg | ORAL_TABLET | Freq: Every day | ORAL | Status: DC
  Administered 2022-05-10 – 2022-05-12 (×3): 80 mg via ORAL
  Filled 2022-05-10 (×2): qty 4
  Filled 2022-05-10: qty 1

## 2022-05-10 MED ORDER — ASPIRIN 81 MG PO CHEW
162.0000 mg | CHEWABLE_TABLET | Freq: Once | ORAL | Status: AC
  Administered 2022-05-10: 162 mg via ORAL
  Filled 2022-05-10: qty 2

## 2022-05-10 MED ORDER — HYDROCOD POLI-CHLORPHE POLI ER 10-8 MG/5ML PO SUER: 5.0000 mL | Freq: Every evening | ORAL | Status: DC | PRN

## 2022-05-10 NOTE — ED Provider Notes (Signed)
[facility] Provider Note    Event Date/Time   First MD Initiated Contact with Patient 05/10/22 1528     (approximate)   History   Chief Complaint: Chest Pain   HPI  Stephen Leon is a 76 y.o. male with a history of tobacco use, hypertension, lung cancer, COPD, GERD who is sent to the ED from cardiology clinic today due to chest pain.  The patient reports that he has been having exertional chest pain for the past few months.  Very reliably he gets squeezing chest pain radiating to the jaw and left arm with walking or standing for any prolonged period of time.  Associated with shortness of breath and diaphoresis.  Gets better with rest.  Currently at rest he is chest pain-free.  He was recently traveling in [place].  1 week ago he went to see a doctor there because of his chest pain and was sent to the emergency department.  He was told that he was either having a heart attack or heart inflammation.  He was unwilling to remain in [place] for continued care, so he left there AMA to [place].  He was started on ticagrelor at that time, which he has taken most recently this morning.  Unfortunately he does not have copies of his results due to his suitcase being misdirected by the airline on his flight back.      Physical Exam   Triage Vital Signs: ED Triage Vitals  Enc Vitals Group     BP 05/10/22 1510 (!) 154/85     Pulse Rate 05/10/22 1510 69     Resp 05/10/22 1510 18     Temp 05/10/22 1510 98.2 F (36.8 C)     Temp Source 05/10/22 1510 Oral     SpO2 05/10/22 1510 92 %     Weight 05/10/22 1507 140 lb (63.5 kg)     Height 05/10/22 1507 5\' 8"  (1.727 m)     Head Circumference --      Peak Flow --      Pain Score 05/10/22 1506 0     Pain Loc --      Pain Edu? --      Excl. in [place]? --     Most recent vital signs: Vitals:   05/10/22 1510  BP: (!) 154/85  Pulse: 69  Resp: 18  Temp: 98.2 F (36.8 C)  SpO2: 92%     General: Awake, no distress.  CV:  Good peripheral perfusion.  Regular rate and rhythm.  Normal distal pulses. Resp:  Normal effort.  Symmetric air movement.  Coarse breath sounds.  No focal crackles. Abd:  No distention.  Soft nontender Other:  No lower extremity edema.   ED Results / Procedures / Treatments   Labs (all labs ordered are listed, but only abnormal results are displayed) Labs Reviewed  BASIC METABOLIC PANEL - Abnormal; Notable for the following components:      Result Value   Glucose, Bld 104 (*)    All other components within normal limits  CBC - Abnormal; Notable for the following components:   RBC 4.01 (*)    All other components within normal limits  TROPONIN I (HIGH SENSITIVITY) - Abnormal; Notable for the following components:   Troponin I (High Sensitivity) 1,906 (*)    All other components within normal limits  RESP PANEL BY RT-PCR (FLU A&B, COVID) ARPGX2  APTT  PROTIME-INR  HEPARIN LEVEL (UNFRACTIONATED)  EKG Interpreted by me Normal sinus rhythm rate of 72.  Left axis, normal intervals.  Normal QRS ST segments and T waves.   RADIOLOGY Chest x-ray interpreted by me, negative for edema or consolidation or pneumothorax.  Radiology report reviewed.   PROCEDURES:  .Critical Care  Performed by: Carrie Mew, MD Authorized by: Carrie Mew, MD   Critical care provider statement:    Critical care time (minutes):  35   Critical care time was exclusive of:  Separately billable procedures and treating other patients   Critical care was necessary to treat or prevent imminent or life-threatening deterioration of the following conditions:  Cardiac failure   Critical care was time spent personally by me on the following activities:  Development of treatment plan with patient or surrogate, discussions with consultants, evaluation of patient's response to treatment, examination of patient, obtaining history from patient or surrogate, ordering  and performing treatments and interventions, ordering and review of laboratory studies, ordering and review of radiographic studies, pulse oximetry, re-evaluation of patient's condition and review of old charts   Care discussed with: admitting provider      MEDICATIONS ORDERED IN ED: Medications  aspirin chewable tablet 162 mg (has no administration in time range)  heparin bolus via infusion 3,800 Units (has no administration in time range)    Followed by  heparin ADULT infusion 100 units/mL (25000 units/296mL) (has no administration in time range)     IMPRESSION / MDM / ASSESSMENT AND PLAN / ED COURSE  I reviewed the triage vital signs and the nursing notes.                              Differential diagnosis includes, but is not limited to, non-STEMI, critical CAD, COPD, viral illness, pneumonia, pleural effusion  Patient's presentation is most consistent with acute presentation with potential threat to life or bodily function.  Patient presents to the ED with exertional precordial chest pain, typical of cardiac etiology.  He was seen by cardiology this morning who sent him to the ED for hospitalization, pending cardiac catheterization tomorrow.  Troponin in the ED is 1900, compatible with NSTEMI.  Will start heparin per protocol.  Continue aspirin and ticagrelor.  Case discussed with hospitalist.       FINAL CLINICAL IMPRESSION(S) / ED DIAGNOSES   Final diagnoses:  NSTEMI (non-ST elevated myocardial infarction) ([place])     Rx / DC Orders   ED Discharge Orders     None        Note:  This document was prepared using Dragon voice recognition software and may include unintentional dictation errors.   Carrie Mew, MD 05/10/22 1558

## 2022-05-10 NOTE — Assessment & Plan Note (Signed)
-   Nicotine gum (Nicorette) as needed ordered per patient's request - Patient declined nicotine patch

## 2022-05-10 NOTE — Assessment & Plan Note (Signed)
-   Labetalol 5 mg IV every 3 hours as needed for SBP greater than 175, 4 doses ordered

## 2022-05-10 NOTE — Consult Note (Signed)
[facility] Cardiology  CARDIOLOGY CONSULT NOTE  Patient ID: Stephen Leon MRN: [id number] DOB/AGE: [birth year] 76 y.o.  Admit date: 05/10/2022 Referring Physician [facility] Primary Physician [facility] Primary Cardiologist Kamry Faraci Reason for Consultation NSTEMI  HPI: 76 year old gentleman referred for evaluation of NSTEMI.  She has a history of COPD, status post resection right upper lobe carcinoma 2013.  He has known coronary artery disease, underwent cardiac catheterization 05/19/2020 which revealed 75% stenosis OM 2.  FFR was negative and the patient was treated medically.  The patient was visiting [place] last week, experienced chest pain at rest, evaluated at local emergency room where he reportedly had elevated troponin greater than 2000 but refused to be admitted for further work-up and was discharged on Brilinta.  He returned home yesterday, presented to the office, where he continued to have recurrent episodes of chest pain with minimal levels of exertion such as brushing his teeth.  ECG was nondiagnostic.  The patient was sent over to [facility] ED where high-sensitivity troponin was elevated to 1906.  Patient sitting in bed, currently denies chest pain.  Review of systems complete and found to be negative unless listed above     Past Medical History:  Diagnosis Date   Anxiety    Bronchitis    Cancer ([place]) 2016   COPD (chronic obstructive pulmonary disease) ([place])    Cough 07/13/2007   Qualifier: Diagnosis of  By: Wynona Luna    Dyspnea    due to copd   Hypertension    pt denies this and is not on bp meds but vascular note states htn   LUNG NODULE 07/13/2007   Qualifier: Diagnosis of  By: Wynona Luna    Pneumonia 2013   Thoracic aortic aneurysm [facility])    followed by vascular    Past Surgical History:  Procedure Laterality Date   EYE SURGERY Bilateral    HERNIA REPAIR     INTRAVASCULAR PRESSURE WIRE/FFR STUDY N/A 05/19/2020   Procedure: INTRAVASCULAR PRESSURE WIRE/FFR STUDY;   Surgeon: Isaias Cowman, MD;  Location: [place] CV LAB;  Service: Cardiovascular;  Laterality: N/A;   KYPHOPLASTY N/A 12/13/2018   Procedure: KYPHOPLASTY- T7;  Surgeon: Hessie Knows, MD;  Location: [facility] ORS;  Service: Orthopedics;  Laterality: N/A;   LEFT HEART CATH AND CORONARY ANGIOGRAPHY Left 05/19/2020   Procedure: LEFT HEART CATH AND CORONARY ANGIOGRAPHY;  Surgeon: Isaias Cowman, MD;  Location: [place] CV LAB;  Service: Cardiovascular;  Laterality: Left;   LUNG LOBECTOMY Right    upper lung due to asbestos exposure   XI ROBOTIC ASSISTED INGUINAL HERNIA REPAIR WITH MESH Right 07/19/2019   Procedure: XI ROBOTIC ASSISTED INGUINAL HERNIA REPAIR WITH MESH;  Surgeon: Benjamine Sprague, DO;  Location: [facility] ORS;  Service: General;  Laterality: Right;    (Not in a hospital admission)  Social History   Socioeconomic History   Marital status: Married    Spouse name: Not on file   Number of children: Not on file   Years of education: Not on file   Highest education level: Not on file  Occupational History   Not on file  Tobacco Use   Smoking status: Every Day    Packs/day: 0.50    Years: 50.00    Total pack years: 25.00    Types: Cigarettes   Smokeless tobacco: Never  Vaping Use   Vaping Use: Never used  Substance and Sexual Activity   Alcohol use: Yes    Alcohol/week: 12.0 standard drinks of alcohol  Types: 12 Standard drinks or equivalent per week    Comment: Drinks 3-4 beers every night   Drug use: No   Sexual activity: Not on file  Other Topics Concern   Not on file  Social History Narrative   Not on file   Social Determinants of Health   Financial Resource Strain: Not on file  Food Insecurity: Not on file  Transportation Needs: Not on file  Physical Activity: Not on file  Stress: Not on file  Social Connections: Not on file  Intimate Partner Violence: Not on file    Family History  Problem Relation Age of Onset   COPD Mother       Review  of systems complete and found to be negative unless listed above      PHYSICAL EXAM  General: Well developed, well nourished, in no acute distress HEENT:  Normocephalic and atramatic Neck:  No JVD.  Lungs: Clear bilaterally to auscultation and percussion. Heart: HRRR . Normal S1 and S2 without gallops or murmurs.  Abdomen: Bowel sounds are positive, abdomen soft and non-tender  Msk:  Back normal, normal gait. Normal strength and tone for age. Extremities: No clubbing, cyanosis or edema.   Neuro: Alert and oriented X 3. Psych:  Good affect, responds appropriately  Labs:   Lab Results  Component Value Date   WBC 9.7 05/10/2022   HGB 13.1 05/10/2022   HCT 40.1 05/10/2022   MCV 100.0 05/10/2022   PLT 223 05/10/2022    Recent Labs  Lab 05/10/22 1510  NA 137  K 3.7  CL 101  CO2 26  BUN 13  CREATININE 0.69  CALCIUM 9.1  GLUCOSE 104*   Lab Results  Component Value Date   TROPONINI <0.03 12/10/2018    Lab Results  Component Value Date   CHOL 191 07/06/2007   Lab Results  Component Value Date   HDL 59.5 07/06/2007   Lab Results  Component Value Date   LDLCALC 99 07/06/2007   Lab Results  Component Value Date   TRIG 165 (H) 07/06/2007   Lab Results  Component Value Date   CHOLHDL 3.2 CALC 07/06/2007   No results found for: "LDLDIRECT"    Radiology: DG Chest 2 View  Result Date: 05/10/2022 CLINICAL DATA:  Chest pain, shortness of breath EXAM: CHEST - 2 VIEW COMPARISON:  04/23/2021, 12/10/2018 FINDINGS: Frontal and lateral views of the chest demonstrate a stable cardiac silhouette. Stable ectasia and atherosclerosis of the thoracic aorta. No airspace disease, effusion, or pneumothorax. Bilateral calcified pleural plaques unchanged. Prior T7 fracture and vertebral augmentation. No acute displaced fracture. IMPRESSION: 1. Stable chest, no acute intrathoracic process. Electronically Signed   By: Randa Ngo M.D.   On: 05/10/2022 15:29    EKG: Sinus rhythm,  RVH, without ischemic ST-T wave changes  ASSESSMENT AND PLAN:   1.  NSTEMI, high-sensitivity troponin 1906, with persistent chest pain with minimal of exertion, currently chest pain-free at rest 2.  COPD  Recommendations  1.  Agree with current therapy 2.  Continue heparin infusion 3.  Continue DAPT with low-dose aspirin and Brilinta 4.  Continue high intensity atorvastatin 5.  Cardiac catheterization with selective coronary arteriography scheduled for 05/11/2022.  The risk, benefits alternatives of cardiac catheterization and possible PCI were explained to the patient and informed written consent was obtained.  Signed: Isaias Cowman MD,PhD, [facility] 05/10/2022, 5:03 PM

## 2022-05-10 NOTE — Assessment & Plan Note (Signed)
-   Presumed secondary to NSTEMI - Continue heparin bolus and GTT per pharmacy - Nitroglycerin ointment, 1 inch, every 6 hours as needed for chest pain - Morphine 2 mg IV every 4 hours as needed for severe pain, 4 doses ordered - Patient sent from [facility] clinic, Dr. Saralyn Pilar is aware of the patient - Complete echo can be ordered by cardiology post cath - N.p.o. after midnight in anticipation of left heart cath in the morning - Admit to progressive cardiac, inpatient

## 2022-05-10 NOTE — ED Triage Notes (Signed)
Pt reports that he was recently in [place] Sunday, left and returned Monday. Pt reports while in [place] he began having chest pain so he was admitted to the hospital in [place] but left AMA to come home. Went to cardiology today and was instructed to come to ED for cardiac cath tomorrow. Reports pain to left chest with exertion and some SOB.

## 2022-05-10 NOTE — Hospital Course (Signed)
[name] is a 76 year old male with history of hypertension, neuropathy, hyperlipidemia, who presents emergency department from outpatient cardiology clinic for chief concerns of chest pain. 10/03: Initial vitals in  ED temperature of 98.2, RR 18, HR 69, BP 154/85, SPO2 of 92% on RA. Serum sodium is 137, potassium 3.7, chloride of 101, bicarb 26, BUN of 13, serum creatinine of 0.69, nonfasting blood glucose is 104. High sensitive troponin is 1906.. WBC 9.7. Hemoglobin is 13.1, platelets of 223. ED treatment: Aspirin 162 mg p.o. one-time dose.  Heparin bolus and gtt. per pharmacy.  Cardiology consult: Continue heparin, DAPT (low-dose ASA plus Brilinta), atorvastatin, cardiac catheterization plan for tomorrow. 10/04: VSS.  Troponin to 1850.  Cardiac cath (+)occluded OM1, preservend LV function.  PCI of OM1 technically challenging and could jeopardize large dominant left circumflex.  Advise continue DAPT, start metoprolol succinate 25 mg daily, isosorbide mononitrate 30 mg daily, plan for discharge in the morning and follow-up in 1 week with cardiology 10/05: no overnight events noted.     Consultants:  Cardiology - [facility] clinic, Dr. Saralyn Pilar  Procedures: Cardiac catheterization 05/11/22: 1st Mrg lesion is 100% stenosed. Prox LAD lesion is 30% stenosed.  The left ventricular systolic function is normal. LV end diastolic pressure is mildly elevated. The left ventricular ejection fraction is 55-65% by visual estimate. No PCI was done d/t technical challenge and possibility of risk to large dominant left circumflex.       ASSESSMENT & PLAN:   Principal Problem:   NSTEMI (non-ST elevated myocardial infarction) ([place]) Active Problems:   Chronic pain syndrome   Tobacco user   Non-small cell carcinoma of lung (HCC)   Essential (primary) hypertension   GAD (generalized anxiety disorder)   Chest pressure  NSTEMI (non-ST elevated myocardial infarction) ([place]) Chest pressure  presumed secondary to NSTEMI heparin bolus initially, now off S/p cardiac cath as above  Complete echo pending read Per cardiology: continue DAPT, start metoprolol succinate 25 mg daily, isosorbide mononitrate 30 mg daily plan for discharge 10/05 and follow-up in 1 week with cardiology  GAD (generalized anxiety disorder) Lorazepam 0.5 mg IV every 6 hours as needed for anxiety, 3 doses ordered  Essential (primary) hypertension Labetalol 5 mg IV every 3 hours as needed for SBP greater than 175, 4 doses ordered  Tobacco user Nicotine gum (Nicorette) as needed ordered per patient's request Patient declined nicotine patch

## 2022-05-10 NOTE — ED Notes (Signed)
Date and time results received: 05/10/22 1549  Test: Troponin Critical Value: 1906  Name of Provider Notified: Dr. Joni Fears

## 2022-05-10 NOTE — H&P (Signed)
History and Physical   Stephen Leon HGD:[id number] DOB: 11-30-1945 DOA: 05/10/2022  PCP: Tracie Harrier, MD  Outpatient Specialists: Dr. Leanna Sato clinic cardiology Patient coming from: [facility]  I have personally briefly reviewed patient's old medical records in [place].  Chief Concern: Chest pain  HPI: [name] is a 76 year old male with history of hypertension, neuropathy, hyperlipidemia, who presents emergency department from outpatient cardiology clinic for chief concerns of chest pain.  Initial vitals in the emergency department showed temperature of 98.2, respiration rate of 18, heart rate of 69, blood pressure 154/85, SPO2 of 92% on room air.  Serum sodium is 137, potassium 3.7, chloride of 101, bicarb 26, BUN of 13, serum creatinine of 0.69, nonfasting blood glucose is 104.  WBC is pending.  Hemoglobin is 13.1, platelets of 223.  High sensitive troponin is 1906.  ED treatment: Aspirin 162 mg p.o. one-time dose.  Heparin bolus and gtt. per pharmacy.  At bedside patient was able to tell me his name, his age, he knows he is in the hospital and he was able to identify his wife at bedside.  He does not appear to be in acute distress.  He states that he has been having chest pain/pressure worse with exertion over the past several months.  He and his wife were traveling to [place] from 04/27/2022 and came back to [place] on 05/08/2022.  He reports at his peak the chest pressure was a 10 out of 10 with associated left arm radiation of pain and numbness.  He endorses discomfort in his jaw.  He reports associated shortness of breath that is worse with exertion.  He denies lower extremity swelling, syncope, loss of consciousness.  On 05/03/2022, he was seen at [facility] for chest pressure.  The plan at the hospital was for him to be admitted for cardiac intervention.  He was in the hospital for approximately 7 to 9 hours and  left AMA.  He then stayed in his hospital room until Sunday morning.  He was seen by his cardiologist on day of admission who sent the patient directly to the emergency department for heparin GTT and plans for left heart cath on 05/11/2022.  Social history: He lives at home with his wife.  He is a current tobacco user smoking approximately 10 to 15 cigarettes/day.  He drinks EtOH.  He denies history of recreational drug use.  He is retired.  ROS: Constitutional: no weight change, no fever ENT/Mouth: no sore throat, no rhinorrhea Eyes: no eye pain, no vision changes Cardiovascular: + chest pain, + dyspnea,  no edema, no palpitations Respiratory: no cough, no sputum, no wheezing Gastrointestinal: no nausea, no vomiting, no diarrhea, no constipation Genitourinary: no urinary incontinence, no dysuria, no hematuria Musculoskeletal: no arthralgias, no myalgias Skin: no skin lesions, no pruritus, Neuro: + weakness, no loss of consciousness, no syncope Psych: no anxiety, no depression, + decrease appetite Heme/Lymph: no bruising, no bleeding  ED Course: Discussed with emergency medicine provider, patient requiring hospitalization for chief concerns of NSTEMI.  Assessment/Plan  Principal Problem:   NSTEMI (non-ST elevated myocardial infarction) ([place]) Active Problems:   Chronic pain syndrome   Tobacco user   Non-small cell carcinoma of lung (HCC)   Essential (primary) hypertension   GAD (generalized anxiety disorder)   Chest pressure   Assessment and Plan:  * NSTEMI (non-ST elevated myocardial infarction) (HCC) - Heparin GTT - Admit to inpatient, progressive cardiac - N.p.o. after midnight, in anticipation of left  heart cath in the a.m.  Chest pressure - Presumed secondary to NSTEMI - Continue heparin bolus and GTT per pharmacy - Nitroglycerin ointment, 1 inch, every 6 hours as needed for chest pain - Morphine 2 mg IV every 4 hours as needed for severe pain, 4 doses ordered -  Patient sent from [facility] clinic, Dr. Saralyn Pilar is aware of the patient - Complete echo can be ordered by cardiology post cath - N.p.o. after midnight in anticipation of left heart cath in the morning - Admit to progressive cardiac, inpatient  GAD (generalized anxiety disorder) - Lorazepam 0.5 mg IV every 6 hours as needed for anxiety, 3 doses ordered  Essential (primary) hypertension - Labetalol 5 mg IV every 3 hours as needed for SBP greater than 175, 4 doses ordered  Tobacco user - Nicotine gum (Nicorette) as needed ordered per patient's request - Patient declined nicotine patch  Chart reviewed.   DVT prophylaxis: Heparin GTT Code Status: Full code Diet: Heart healthy now; n.p.o. after midnight Family Communication: Updated spouse at bedside with patient's permission Disposition Plan: Pending clinical course Consults called: Cardiology Admission status: Inpatient, progressive cardiac  Past Medical History:  Diagnosis Date   Anxiety    Bronchitis    Cancer ([place]) 2016   COPD (chronic obstructive pulmonary disease) ([place])    Cough 07/13/2007   Qualifier: Diagnosis of  By: Wynona Luna    Dyspnea    due to copd   Hypertension    pt denies this and is not on bp meds but vascular note states htn   LUNG NODULE 07/13/2007   Qualifier: Diagnosis of  By: Wynona Luna    Pneumonia 2013   Thoracic aortic aneurysm [facility])    followed by vascular   Past Surgical History:  Procedure Laterality Date   EYE SURGERY Bilateral    HERNIA REPAIR     INTRAVASCULAR PRESSURE WIRE/FFR STUDY N/A 05/19/2020   Procedure: INTRAVASCULAR PRESSURE WIRE/FFR STUDY;  Surgeon: Isaias Cowman, MD;  Location: [facility];  Service: Cardiovascular;  Laterality: N/A;   KYPHOPLASTY N/A 12/13/2018   Procedure: KYPHOPLASTY- T7;  Surgeon: Hessie Knows, MD;  Location: [facility];  Service: Orthopedics;  Laterality: N/A;   LEFT HEART CATH AND CORONARY ANGIOGRAPHY Left 05/19/2020    Procedure: LEFT HEART CATH AND CORONARY ANGIOGRAPHY;  Surgeon: Isaias Cowman, MD;  Location: [facility];  Service: Cardiovascular;  Laterality: Left;   LUNG LOBECTOMY Right    upper lung due to asbestos exposure   XI ROBOTIC ASSISTED INGUINAL HERNIA REPAIR WITH MESH Right 07/19/2019   Procedure: XI ROBOTIC ASSISTED INGUINAL HERNIA REPAIR WITH MESH;  Surgeon: Benjamine Sprague, DO;  Location: [facility] ORS;  Service: General;  Laterality: Right;   Social History:  reports that he has been smoking cigarettes. He has a 25.00 pack-year smoking history. He has never used smokeless tobacco. He reports current alcohol use of about 12.0 standard drinks of alcohol per week. He reports that he does not use drugs.  No Known Allergies Family History  Problem Relation Age of Onset   COPD Mother    Family history: Family history reviewed and not pertinent  Prior to Admission medications   Medication Sig Start Date End Date Taking? Authorizing Provider  gabapentin (NEURONTIN) 800 MG tablet Take 800 mg by mouth 2 (two) times daily.  03/09/20   [provider]  ibuprofen (ADVIL) 800 MG tablet Take 1 tablet (800 mg total) by mouth every 8 (eight) hours as  needed for mild pain or moderate pain. Patient taking differently: Take 800 mg by mouth 2 (two) times daily.  07/19/19   Sakai, Isami, DO  ipratropium-albuterol (DUONEB) 0.5-2.5 (3) MG/3ML SOLN Inhale 3 mLs into the lungs every 4 (four) hours as needed (When he get sick and the doctor tells him to take it).  06/29/18   [provider]  tamsulosin (FLOMAX) 0.4 MG CAPS capsule Take 0.4 mg by mouth at bedtime. 03/24/20   [provider]  TRELEGY ELLIPTA 100-62.5-25 MCG/INH AEPB Inhale 1 puff into the lungs daily. 03/16/20   [provider]   Physical Exam: Vitals:   05/10/22 1507 05/10/22 1510 05/10/22 1606  BP:  (!) 154/85 (!) 162/96  Pulse:  69 69  Resp:  18 16  Temp:  98.2 F (36.8 C) 98.3 F (36.8 C)  TempSrc:   Oral Oral  SpO2:  92% 94%  Weight: 63.5 kg    Height: 5\' 8"  (1.727 m)     Constitutional: appears age-appropriate, frail, NAD, calm, comfortable Eyes: PERRL, lids and conjunctivae normal ENMT: Mucous membranes are moist. Posterior pharynx clear of any exudate or lesions. Age-appropriate dentition. Hearing appropriate Neck: normal, supple, no masses, no thyromegaly Respiratory: clear to auscultation bilaterally, no wheezing, no crackles. Normal respiratory effort. No accessory muscle use.  Cardiovascular: Regular rate and rhythm, no murmurs / rubs / gallops. No extremity edema. 2+ pedal pulses. No carotid bruits.  Abdomen: no tenderness, no masses palpated, no hepatosplenomegaly. Bowel sounds positive.  Musculoskeletal: no clubbing / cyanosis. No joint deformity upper and lower extremities. Good ROM, no contractures, no atrophy. Normal muscle tone.  Skin: no rashes, lesions, ulcers. No induration.  Multiple tattoos that appears old and well-healed upper extremities. Neurologic: Sensation intact. Strength 5/5 in all 4.  Psychiatric: Normal judgment and insight. Alert and oriented x 3. Normal mood.   EKG: independently reviewed, showing sinus rhythm with rate of 72, QTc 444  Chest x-ray on Admission: I personally reviewed and I agree with radiologist reading as below.  DG Chest 2 View  Result Date: 05/10/2022 CLINICAL DATA:  Chest pain, shortness of breath EXAM: CHEST - 2 VIEW COMPARISON:  04/23/2021, 12/10/2018 FINDINGS: Frontal and lateral views of the chest demonstrate a stable cardiac silhouette. Stable ectasia and atherosclerosis of the thoracic aorta. No airspace disease, effusion, or pneumothorax. Bilateral calcified pleural plaques unchanged. Prior T7 fracture and vertebral augmentation. No acute displaced fracture. IMPRESSION: 1. Stable chest, no acute intrathoracic process. Electronically Signed   By: Randa Ngo M.D.   On: 05/10/2022 15:29    Labs on Admission: I have personally  reviewed following labs  CBC: Recent Labs  Lab 05/10/22 1510  WBC 9.7  HGB 13.1  HCT 40.1  MCV 100.0  PLT 024   Basic Metabolic Panel: Recent Labs  Lab 05/10/22 1510  NA 137  K 3.7  CL 101  CO2 26  GLUCOSE 104*  BUN 13  CREATININE 0.69  CALCIUM 9.1   GFR: Estimated Creatinine Clearance: 71.7 mL/min (by C-G formula based on SCr of 0.69 mg/dL).  CRITICAL CARE Performed by: Dr. Tobie Poet  Total critical care time: 35 minutes  Critical care time was exclusive of separately billable procedures and treating other patients.  Critical care was necessary to treat or prevent imminent or life-threatening deterioration.  Critical care was time spent personally by me on the following activities: development of treatment plan with patient and/or surrogate as well as nursing, discussions with consultants, evaluation of patient's response to  treatment, examination of patient, obtaining history from patient or surrogate, ordering and performing treatments and interventions, ordering and review of laboratory studies, ordering and review of radiographic studies, pulse oximetry and re-evaluation of patient's condition.  Dr. Tobie Poet [facility]  If 7PM-7AM, please contact overnight-coverage provider If 7AM-7PM, please contact day coverage provider [email or web address]  05/10/2022, 5:53 PM

## 2022-05-10 NOTE — Assessment & Plan Note (Signed)
-   Lorazepam 0.5 mg IV every 6 hours as needed for anxiety, 3 doses ordered

## 2022-05-10 NOTE — Assessment & Plan Note (Signed)
-   Heparin GTT - Admit to inpatient, progressive cardiac - N.p.o. after midnight, in anticipation of left heart cath in the a.m.

## 2022-05-10 NOTE — Consult Note (Signed)
[place] for [facility] Indication: chest pain/ACS  No Known Allergies  Patient Measurements: Height: 5\' 8"  (172.7 cm) Weight: 63.5 kg (140 lb) IBW/kg (Calculated) : 68.4 Heparin Dosing Weight: 63.5 kg  Vital Signs: Temp: 98.2 F (36.8 C) (10/03 1510) Temp Source: Oral (10/03 1510) BP: 154/85 (10/03 1510) Pulse Rate: 69 (10/03 1510)  Labs: Recent Labs    05/10/22 1510  HGB 13.1  HCT 40.1  PLT 223    CrCl cannot be calculated (Patient's most recent lab result is older than the maximum 21 days allowed.).   Medical History: Past Medical History:  Diagnosis Date   Anxiety    Bronchitis    Cancer ([place]) 2016   COPD (chronic obstructive pulmonary disease) ([place])    Cough 07/13/2007   Qualifier: Diagnosis of  By: Wynona Luna    Dyspnea    due to copd   Hypertension    pt denies this and is not on bp meds but vascular note states htn   LUNG NODULE 07/13/2007   Qualifier: Diagnosis of  By: Wynona Luna    Pneumonia 2013   Thoracic aortic aneurysm [facility])    followed by vascular    Medications:  No anticoagulation per pharmacist review.  Assessment: 76 yo male presents from cardiology office with complaint of CP.  Pharmacy consulted to dose and manage heparin drip.  Baseline aPTT and INR pending. Baseline CBC acceptable.  Goal of Therapy:  Heparin level 0.3-0.7 units/ml Monitor platelets by anticoagulation protocol: Yes   Plan:  Give 3800 units bolus x 1 Start heparin infusion at 750 units/hr Heparin level 8 hours after drip is initiated Monitor daily CBC  Lorin Picket 05/10/2022,3:47 PM

## 2022-05-11 ENCOUNTER — Inpatient Hospital Stay
Admit: 2022-05-11 | Discharge: 2022-05-11 | Disposition: A | Discharge status: Home / Self Care | Payer: Medicare HMO | Attending: Cardiology | Admitting: Cardiology

## 2022-05-11 ENCOUNTER — Encounter: Payer: Self-pay | Admitting: Internal Medicine

## 2022-05-11 ENCOUNTER — Encounter
Admission: EM | Disposition: A | Discharge status: Home / Self Care | Payer: Self-pay | Source: Home / Self Care | Attending: Osteopathic Medicine

## 2022-05-11 HISTORY — PX: LEFT HEART CATH: CATH118248

## 2022-05-11 LAB — CBC
HCT: 39.4 % (ref 39.0–52.0)
Hemoglobin: 12.9 g/dL — ABNORMAL LOW (ref 13.0–17.0)
MCH: 32.6 pg (ref 26.0–34.0)
MCHC: 32.7 g/dL (ref 30.0–36.0)
MCV: 99.5 fL (ref 80.0–100.0)
Platelets: 212 10*3/uL (ref 150–400)
RBC: 3.96 MIL/uL — ABNORMAL LOW (ref 4.22–5.81)
RDW: 14.9 % (ref 11.5–15.5)
WBC: 9.3 10*3/uL (ref 4.0–10.5)
nRBC: 0 % (ref 0.0–0.2)

## 2022-05-11 LAB — BASIC METABOLIC PANEL
Anion gap: 5 (ref 5–15)
BUN: 14 mg/dL (ref 8–23)
CO2: 27 mmol/L (ref 22–32)
Calcium: 8.8 mg/dL — ABNORMAL LOW (ref 8.9–10.3)
Chloride: 106 mmol/L (ref 98–111)
Creatinine, Ser: 0.7 mg/dL (ref 0.61–1.24)
GFR, Estimated: >60 mL/min (ref >60)
Glucose, Bld: 98 mg/dL (ref 70–99)
Potassium: 4.1 mmol/L (ref 3.5–5.1)
Sodium: 138 mmol/L (ref 135–145)

## 2022-05-11 LAB — HEPARIN LEVEL (UNFRACTIONATED)
Heparin Unfractionated: 0.15 IU/mL — ABNORMAL LOW (ref 0.30–0.70)
Heparin Unfractionated: <0.1 IU/mL — ABNORMAL LOW (ref 0.30–0.70)

## 2022-05-11 LAB — TROPONIN I (HIGH SENSITIVITY): Troponin I (High Sensitivity): 1850 ng/L (ref <18)

## 2022-05-11 SURGERY — LEFT HEART CATH
Anesthesia: Moderate Sedation

## 2022-05-11 MED ORDER — LIDOCAINE HCL 1 % IJ SOLN
INTRAMUSCULAR | Status: AC
  Filled 2022-05-11: qty 20

## 2022-05-11 MED ORDER — HYDRALAZINE HCL 20 MG/ML IJ SOLN: 10.0000 mg | INTRAMUSCULAR | Status: AC | PRN

## 2022-05-11 MED ORDER — HEPARIN BOLUS VIA INFUSION
1900.0000 [IU] | Freq: Once | INTRAVENOUS | Status: AC
  Administered 2022-05-11: 1900 [IU] via INTRAVENOUS
  Filled 2022-05-11: qty 1900

## 2022-05-11 MED ORDER — METOPROLOL SUCCINATE ER 25 MG PO TB24
25.0000 mg | ORAL_TABLET | Freq: Every day | ORAL | Status: DC
  Administered 2022-05-11 – 2022-05-12 (×2): 25 mg via ORAL
  Filled 2022-05-11 (×2): qty 1

## 2022-05-11 MED ORDER — MIDAZOLAM HCL 2 MG/2ML IJ SOLN
INTRAMUSCULAR | Status: DC | PRN
  Administered 2022-05-11: 1 mg via INTRAVENOUS

## 2022-05-11 MED ORDER — ONDANSETRON HCL 4 MG/2ML IJ SOLN: 4.0000 mg | Freq: Four times a day (QID) | INTRAMUSCULAR | Status: DC | PRN

## 2022-05-11 MED ORDER — SODIUM CHLORIDE 0.9 % IV SOLN: 250.0000 mL | INTRAVENOUS | Status: DC | PRN

## 2022-05-11 MED ORDER — SODIUM CHLORIDE 0.9% FLUSH
3.0000 mL | Freq: Two times a day (BID) | INTRAVENOUS | Status: DC
  Administered 2022-05-11 – 2022-05-12 (×2): 3 mL via INTRAVENOUS

## 2022-05-11 MED ORDER — LABETALOL HCL 5 MG/ML IV SOLN: 10.0000 mg | INTRAVENOUS | Status: AC | PRN

## 2022-05-11 MED ORDER — VERAPAMIL HCL 2.5 MG/ML IV SOLN
INTRAVENOUS | Status: AC
  Filled 2022-05-11: qty 2

## 2022-05-11 MED ORDER — PERFLUTREN LIPID MICROSPHERE
1.0000 mL | INTRAVENOUS | Status: AC | PRN
  Administered 2022-05-11: 2 mL via INTRAVENOUS

## 2022-05-11 MED ORDER — ISOSORBIDE MONONITRATE ER 30 MG PO TB24
30.0000 mg | ORAL_TABLET | Freq: Every day | ORAL | Status: DC
  Administered 2022-05-11 – 2022-05-12 (×2): 30 mg via ORAL
  Filled 2022-05-11 (×2): qty 1

## 2022-05-11 MED ORDER — FENTANYL CITRATE (PF) 100 MCG/2ML IJ SOLN
INTRAMUSCULAR | Status: AC
  Filled 2022-05-11: qty 2

## 2022-05-11 MED ORDER — HEPARIN SODIUM (PORCINE) 1000 UNIT/ML IJ SOLN
INTRAMUSCULAR | Status: DC | PRN
  Administered 2022-05-11: 3500 [IU] via INTRAVENOUS

## 2022-05-11 MED ORDER — FENTANYL CITRATE (PF) 100 MCG/2ML IJ SOLN
INTRAMUSCULAR | Status: DC | PRN
  Administered 2022-05-11: 25 ug via INTRAVENOUS

## 2022-05-11 MED ORDER — CLOPIDOGREL BISULFATE 75 MG PO TABS
75.0000 mg | ORAL_TABLET | Freq: Every day | ORAL | Status: DC
  Administered 2022-05-11 – 2022-05-12 (×2): 75 mg via ORAL
  Filled 2022-05-11 (×2): qty 1

## 2022-05-11 MED ORDER — ASPIRIN 81 MG PO TBEC
81.0000 mg | DELAYED_RELEASE_TABLET | Freq: Every day | ORAL | Status: DC
  Administered 2022-05-12: 81 mg via ORAL
  Filled 2022-05-11: qty 1

## 2022-05-11 MED ORDER — SODIUM CHLORIDE 0.9 % WEIGHT BASED INFUSION: 1.0000 mL/kg/h | INTRAVENOUS | Status: AC

## 2022-05-11 MED ORDER — SODIUM CHLORIDE 0.9% FLUSH: 3.0000 mL | INTRAVENOUS | Status: DC | PRN

## 2022-05-11 MED ORDER — SODIUM CHLORIDE 0.9 % WEIGHT BASED INFUSION
3.0000 mL/kg/h | INTRAVENOUS | Status: AC
  Administered 2022-05-11: 3 mL/kg/h via INTRAVENOUS

## 2022-05-11 MED ORDER — IOHEXOL 300 MG/ML  SOLN
INTRAMUSCULAR | Status: DC | PRN
  Administered 2022-05-11: 72 mL

## 2022-05-11 MED ORDER — ASPIRIN 81 MG PO CHEW
81.0000 mg | CHEWABLE_TABLET | ORAL | Status: AC
  Administered 2022-05-11: 81 mg via ORAL

## 2022-05-11 MED ORDER — VERAPAMIL HCL 2.5 MG/ML IV SOLN
INTRAVENOUS | Status: DC | PRN
  Administered 2022-05-11: 2.5 mg via INTRA_ARTERIAL

## 2022-05-11 MED ORDER — LIDOCAINE HCL (PF) 1 % IJ SOLN
INTRAMUSCULAR | Status: DC | PRN
  Administered 2022-05-11: 2 mL

## 2022-05-11 MED ORDER — ACETAMINOPHEN 325 MG PO TABS: 650.0000 mg | ORAL_TABLET | ORAL | Status: DC | PRN

## 2022-05-11 MED ORDER — MIDAZOLAM HCL 2 MG/2ML IJ SOLN
INTRAMUSCULAR | Status: AC
  Filled 2022-05-11: qty 2

## 2022-05-11 MED ORDER — HEPARIN SODIUM (PORCINE) 1000 UNIT/ML IJ SOLN
INTRAMUSCULAR | Status: AC
  Filled 2022-05-11: qty 10

## 2022-05-11 MED ORDER — ASPIRIN 81 MG PO CHEW
CHEWABLE_TABLET | ORAL | Status: AC
  Filled 2022-05-11: qty 1

## 2022-05-11 MED ORDER — HEPARIN (PORCINE) IN NACL 1000-0.9 UT/500ML-% IV SOLN
INTRAVENOUS | Status: AC
  Filled 2022-05-11: qty 1000

## 2022-05-11 MED ORDER — SODIUM CHLORIDE 0.9 % WEIGHT BASED INFUSION: 1.0000 mL/kg/h | INTRAVENOUS | Status: DC

## 2022-05-11 SURGICAL SUPPLY — 12 items
BAND CMPR LRG ZPHR (HEMOSTASIS) ×1
BAND ZEPHYR COMPRESS 30 LONG (HEMOSTASIS) IMPLANT
CATH 5FR JL3.5 JR4 ANG PIG MP (CATHETERS) IMPLANT
CATH INFINITI 5FR ANG PIGTAIL (CATHETERS) IMPLANT
DRAPE BRACHIAL (DRAPES) IMPLANT
GLIDESHEATH SLEND SS 6F .021 (SHEATH) IMPLANT
GUIDEWIRE INQWIRE 1.5J.035X260 (WIRE) IMPLANT
INQWIRE 1.5J .035X260CM (WIRE) ×1
PACK CARDIAC CATH (CUSTOM PROCEDURE TRAY) ×1 IMPLANT
PROTECTION STATION PRESSURIZED (MISCELLANEOUS) ×1
SET ATX SIMPLICITY (MISCELLANEOUS) IMPLANT
STATION PROTECTION PRESSURIZED (MISCELLANEOUS) IMPLANT

## 2022-05-11 NOTE — Consult Note (Signed)
[place] for [facility] Indication: chest pain/ACS  No Known Allergies  Patient Measurements: Height: 5\' 8"  (172.7 cm) Weight: 63.5 kg (140 lb) IBW/kg (Calculated) : 68.4 Heparin Dosing Weight: 63.5 kg  Vital Signs: Temp: 98.6 F (37 C) (10/04 0129) Temp Source: Oral (10/04 0129) BP: 142/95 (10/04 0125) Pulse Rate: 77 (10/04 0125)  Labs: Recent Labs    05/10/22 1510 05/10/22 1601 05/11/22 0122  HGB 13.1  --   --   HCT 40.1  --   --   PLT 223  --   --   APTT  --  28  --   LABPROT  --  13.4  --   INR  --  1.0  --   HEPARINUNFRC  --   --  0.15*  CREATININE 0.69  --   --   TROPONINIHS 1,906*  --  1,850*     Estimated Creatinine Clearance: 71.7 mL/min (by C-G formula based on SCr of 0.69 mg/dL).   Medical History: Past Medical History:  Diagnosis Date   Anxiety    Bronchitis    Cancer ([place]) 2016   COPD (chronic obstructive pulmonary disease) ([place])    Cough 07/13/2007   Qualifier: Diagnosis of  By: Wynona Luna    Dyspnea    due to copd   Hypertension    pt denies this and is not on bp meds but vascular note states htn   LUNG NODULE 07/13/2007   Qualifier: Diagnosis of  By: Wynona Luna    Pneumonia 2013   Thoracic aortic aneurysm [facility])    followed by vascular    Medications:  No anticoagulation per pharmacist review.  Assessment: 76 yo male presents from cardiology office with complaint of CP.  Pharmacy consulted to dose and manage heparin drip.  Baseline aPTT and INR pending. Baseline CBC acceptable.  Goal of Therapy:  Heparin level 0.3-0.7 units/ml Monitor platelets by anticoagulation protocol: Yes   10/4 0122 HL 0.15, subtherapeutic  Plan:  Bolus 1900 units x 1 Increase heparin infusion to 950 units/hr Will recheck HL in 8 hr after rate change CBC daily while on heparin  Renda Rolls, PharmD, [facility] 05/11/2022 2:45 AM

## 2022-05-11 NOTE — ED Notes (Signed)
Pts wife to desk asking to speak with supervisor. Agricultural consultant notified. This RN to bedside with charge RN. Pts wife expresses anger that pt "had to sleep in his clothes", this RN placed pt in gown, pt prefers to leave his pants on at this time. Wife also states "If you didn't have a room upstairs for him I would have taken him somewhere else!" This RN and charge RN attempt to apologize and explain that the ER is holding pts as there is no room upstairs. Pts wife states "well that's not good enough!" Pts wife demanding a bed upstairs, charge RN notified pts wife that there are no available beds at this time but we will get him up to a room as soon as a bed is given. Pts wife states "we'll never come back here." Pt denies any complaints or needs at this time.

## 2022-05-11 NOTE — ED Notes (Signed)
Patient updated on plan of care.  Reports chest pain only with movement and excursion currently

## 2022-05-11 NOTE — Progress Notes (Signed)
PROGRESS NOTE    Stephen Leon   HER:[id number] DOB: [birth year]  DOA: 05/10/2022 Date of Service: 05/11/22 PCP: Tracie Harrier, MD     Brief Narrative / Hospital Course:  Stephen Leon is a 76 year old male with history of hypertension, neuropathy, hyperlipidemia, who presents emergency department from outpatient cardiology clinic for chief concerns of chest pain. 10/03: Initial vitals in  ED temperature of 98.2, RR 18, HR 69, BP 154/85, SPO2 of 92% on RA. Serum sodium is 137, potassium 3.7, chloride of 101, bicarb 26, BUN of 13, serum creatinine of 0.69, nonfasting blood glucose is 104. High sensitive troponin is 1906.. WBC 9.7. Hemoglobin is 13.1, platelets of 223. ED treatment: Aspirin 162 mg p.o. one-time dose.  Heparin bolus and gtt. per pharmacy.  Cardiology consult: Continue heparin, DAPT (low-dose ASA plus Brilinta), atorvastatin, cardiac catheterization plan for tomorrow. 10/04: VSS.  Troponin to 1850.  Cardiac cath (+)occluded OM1, preservend LV function.  PCI of OM1 technically challenging and could jeopardize large dominant left circumflex.  Advise continue DAPT, start metoprolol succinate 25 mg daily, isosorbide mononitrate 30 mg daily, plan for discharge in the morning and follow-up in 1 week with cardiology    Consultants:  Cardiology - [facility] clinic, Dr. Saralyn Pilar  Procedures: Cardiac catheterization 05/11/22: 1st Mrg lesion is 100% stenosed. Prox LAD lesion is 30% stenosed.  The left ventricular systolic function is normal. LV end diastolic pressure is mildly elevated. The left ventricular ejection fraction is 55-65% by visual estimate. No PCI was done d/t technical challenge and possibility of risk to large dominant left circumflex.       ASSESSMENT & PLAN:   Principal Problem:   NSTEMI (non-ST elevated myocardial infarction) ([place]) Active Problems:   Chronic pain syndrome   Tobacco user   Non-small cell carcinoma of lung (HCC)    Essential (primary) hypertension   GAD (generalized anxiety disorder)   Chest pressure  NSTEMI (non-ST elevated myocardial infarction) ([place]) Chest pressure presumed secondary to NSTEMI heparin bolus and GTT per pharmacy and per cardiology  Nitroglycerin ointment, 1 inch, every 6 hours as needed for chest pain Morphine 2 mg IV every 4 hours as needed for severe pain, 4 doses ordered S/p cardiac cath as above  Complete echo per cardiology post cath Per cardiology: continue DAPT, start metoprolol succinate 25 mg daily, isosorbide mononitrate 30 mg daily plan for discharge in the morning and follow-up in 1 week with cardiology  GAD (generalized anxiety disorder) Lorazepam 0.5 mg IV every 6 hours as needed for anxiety, 3 doses ordered  Essential (primary) hypertension Labetalol 5 mg IV every 3 hours as needed for SBP greater than 175, 4 doses ordered  Tobacco user Nicotine gum (Nicorette) as needed ordered per patient's request Patient declined nicotine patch     DVT prophylaxis: n/a on heparin gtt now for NSTEMI Pertinent IV fluids/nutrition: npo this AM anticipating cath Central lines / invasive devices: none  Code Status: FULL CODE Family Communication: wife at bedside on rounds post-cath   Disposition: inpatient  TOC needs: none at this time  Barriers to discharge / significant pending items: if stable overnight plan d/c in AM per cardiology              Subjective:  Patient reports feeling fine, no concerns, no chest pain.        Objective:  Vitals:   05/11/22 1530 05/11/22 1545 05/11/22 1600 05/11/22 1617  BP: 123/74  122/79 119/82  Pulse: 71 71  63  Resp:    17  Temp:    98.2 F (36.8 C)  TempSrc:      SpO2: 94% 94%  96%  Weight:      Height:        Intake/Output Summary (Last 24 hours) at 05/11/2022 1635 Last data filed at 05/11/2022 1033 Gross per 24 hour  Intake --  Output 50 ml  Net -50 ml   Filed Weights   05/10/22 1507 05/11/22  1036  Weight: 63.5 kg 65.8 kg    Examination:  Constitutional:  VS as above General Appearance: alert, well-developed, well-nourished, NAD Respiratory: Normal respiratory effort No wheeze No rhonchi No rales Cardiovascular: S1/S2 normal No murmur No rub/gallop auscultated No lower extremity edema Musculoskeletal:  No clubbing/cyanosis of digits Symmetrical movement in all extremities Neurological: No cranial nerve deficit on limited exam Alert Psychiatric: Normal judgment/insight Normal mood and affect       Scheduled Medications:   aspirin       [START ON 05/12/2022] aspirin EC  81 mg Oral Daily   atorvastatin  80 mg Oral Daily   clopidogrel  75 mg Oral Daily   fluticasone furoate-vilanterol  1 puff Inhalation Daily   And   umeclidinium bromide  1 puff Inhalation Daily   gabapentin  800 mg Oral BID   isosorbide mononitrate  30 mg Oral Daily   metoprolol succinate  25 mg Oral Daily   sodium chloride flush  3 mL Intravenous Q12H   sodium chloride flush  3 mL Intravenous Q12H    Continuous Infusions:  sodium chloride     sodium chloride 1 mL/kg/hr (05/11/22 1259)   heparin Stopped (05/11/22 1213)    PRN Medications:  sodium chloride, acetaminophen **OR** acetaminophen, acetaminophen, aspirin, chlorpheniramine-HYDROcodone, hydrALAZINE, ipratropium-albuterol, labetalol, labetalol, LORazepam, morphine injection, nicotine polacrilex, nitroGLYCERIN, ondansetron **OR** ondansetron (ZOFRAN) IV, ondansetron (ZOFRAN) IV, senna-docusate, sodium chloride flush  Antimicrobials:  Anti-infectives (From admission, onward)    None       Data Reviewed: I have personally reviewed following labs and imaging studies  CBC: Recent Labs  Lab 05/10/22 1510 05/11/22 0635  WBC 9.7 9.3  HGB 13.1 12.9*  HCT 40.1 39.4  MCV 100.0 99.5  PLT 223 361   Basic Metabolic Panel: Recent Labs  Lab 05/10/22 1510 05/11/22 0635  NA 137 138  K 3.7 4.1  CL 101 106  CO2 26 27   GLUCOSE 104* 98  BUN 13 14  CREATININE 0.69 0.70  CALCIUM 9.1 8.8*   GFR: Estimated Creatinine Clearance: 74.3 mL/min (by C-G formula based on SCr of 0.7 mg/dL). Liver Function Tests: No results for input(s): "AST", "ALT", "ALKPHOS", "BILITOT", "PROT", "ALBUMIN" in the last 168 hours. No results for input(s): "LIPASE", "AMYLASE" in the last 168 hours. No results for input(s): "AMMONIA" in the last 168 hours. Coagulation Profile: Recent Labs  Lab 05/10/22 1601  INR 1.0   Cardiac Enzymes: No results for input(s): "CKTOTAL", "CKMB", "CKMBINDEX", "TROPONINI" in the last 168 hours. BNP (last 3 results) No results for input(s): "PROBNP" in the last 8760 hours. HbA1C: No results for input(s): "HGBA1C" in the last 72 hours. CBG: No results for input(s): "GLUCAP" in the last 168 hours. Lipid Profile: No results for input(s): "CHOL", "HDL", "LDLCALC", "TRIG", "CHOLHDL", "LDLDIRECT" in the last 72 hours. Thyroid Function Tests: No results for input(s): "TSH", "T4TOTAL", "FREET4", "T3FREE", "THYROIDAB" in the last 72 hours. Anemia Panel: No results for input(s): "VITAMINB12", "FOLATE", "FERRITIN", "TIBC", "IRON", "RETICCTPCT" in the last 72 hours. Urine analysis: No  results found for: "COLORURINE", "APPEARANCEUR", "LABSPEC", "PHURINE", "GLUCOSEU", "HGBUR", "BILIRUBINUR", "KETONESUR", "PROTEINUR", "UROBILINOGEN", "NITRITE", "LEUKOCYTESUR" Sepsis Labs: @LABRCNTIP (procalcitonin:4,lacticidven:4)  Recent Results (from the past 240 hour(s))  Resp Panel by RT-PCR (Flu A&B, Covid) Anterior Nasal Swab     Status: None   Collection Time: 05/10/22  4:01 PM   Specimen: Anterior Nasal Swab  Result Value Ref Range Status   SARS Coronavirus 2 by RT PCR NEGATIVE NEGATIVE Final    Comment: (NOTE) SARS-CoV-2 target nucleic acids are NOT DETECTED.  The SARS-CoV-2 RNA is generally detectable in upper respiratory specimens during the acute phase of infection. The lowest concentration of SARS-CoV-2  viral copies this assay can detect is 138 copies/mL. A negative result does not preclude SARS-Cov-2 infection and should not be used as the sole basis for treatment or other patient management decisions. A negative result may occur with  improper specimen collection/handling, submission of specimen other than nasopharyngeal swab, presence of viral mutation(s) within the areas targeted by this assay, and inadequate number of viral copies(<138 copies/mL). A negative result must be combined with clinical observations, patient history, and epidemiological information. The expected result is Negative.  Fact Sheet for Patients:  EntrepreneurPulse.com.au  Fact Sheet for Healthcare Providers:  IncredibleEmployment.be  This test is no t yet approved or cleared by the [place] FDA and  has been authorized for detection and/or diagnosis of SARS-CoV-2 by FDA under an Emergency Use Authorization (EUA). This EUA will remain  in effect (meaning this test can be used) for the duration of the COVID-19 declaration under Section 564(b)(1) of the Act, 21 U.S.C.section 360bbb-3(b)(1), unless the authorization is terminated  or revoked sooner.       Influenza A by PCR NEGATIVE NEGATIVE Final   Influenza B by PCR NEGATIVE NEGATIVE Final    Comment: (NOTE) The Xpert Xpress SARS-CoV-2/FLU/RSV plus assay is intended as an aid in the diagnosis of influenza from Nasopharyngeal swab specimens and should not be used as a sole basis for treatment. Nasal washings and aspirates are unacceptable for Xpert Xpress SARS-CoV-2/FLU/RSV testing.  Fact Sheet for Patients: EntrepreneurPulse.com.au  Fact Sheet for Healthcare Providers: IncredibleEmployment.be  This test is not yet approved or cleared by the [place] FDA and has been authorized for detection and/or diagnosis of SARS-CoV-2 by FDA under an Emergency Use Authorization  (EUA). This EUA will remain in effect (meaning this test can be used) for the duration of the COVID-19 declaration under Section 564(b)(1) of the Act, 21 U.S.C. section 360bbb-3(b)(1), unless the authorization is terminated or revoked.  Performed at [facility], [address], [place], [place] [address]          Radiology Studies: CARDIAC CATHETERIZATION  Result Date: 05/11/2022   1st Mrg lesion is 100% stenosed.   Prox LAD lesion is 30% stenosed.   The left ventricular systolic function is normal.   LV end diastolic pressure is mildly elevated.   The left ventricular ejection fraction is 55-65% by visual estimate. 1.  NSTEMI, late presentation (1 week ) 2.  Occluded OM1 3.  Preserved left ventricular function Recommendations 1.  Medical therapy.  PCI of occluded OM1 be technically challenging, and could jeopardize large dominant left circumflex. 2.  Continue dual antiplatelet therapy 3.  Start metoprolol succinate 25 mg daily 4.  Start isosorbide mononitrate 30 mg daily 5.  Plan for discharge in a.m. 6.  Follow-up 1 week after discharge   DG Chest 2 View  Result Date: 05/10/2022 CLINICAL DATA:  Chest pain, shortness  of breath EXAM: CHEST - 2 VIEW COMPARISON:  04/23/2021, 12/10/2018 FINDINGS: Frontal and lateral views of the chest demonstrate a stable cardiac silhouette. Stable ectasia and atherosclerosis of the thoracic aorta. No airspace disease, effusion, or pneumothorax. Bilateral calcified pleural plaques unchanged. Prior T7 fracture and vertebral augmentation. No acute displaced fracture. IMPRESSION: 1. Stable chest, no acute intrathoracic process. Electronically Signed   By: Randa Ngo M.D.   On: 05/10/2022 15:29            LOS: 1 day      Emeterio Reeve, DO [facility] 05/11/2022, 4:35 PM   Staff may message me via secure chat in [place]  but this may not receive immediate response,  please page for urgent matters!  If 7PM-7AM, please contact  night-coverage [email or web address]  Dictation software was used to generate the above note. Typos may occur and escape review, as with typed/written notes. Please contact Dr Sheppard Coil directly for clarity if needed.

## 2022-05-11 NOTE — ED Notes (Signed)
This RN was notified that patients family would like to speak with supervisor. This RN went into room and introduced self to patient and his wife. Primary RN Courtenay at bedside with the patient. Pt wife states that she is upset that patient was to be admitted and that he has not been placed in a room and that he has been in the ED all night. Pt wife also states that she is upset that pt was left in his clothes and shoes last night. Pt wife states that they should have been told yesterday that there was not any rooms available so they could have left and went to another hospital and that this circumstances are causing the patient stress in addition to his illness. Pt wife states that she wants patient moved to a room now. This RN apologized that pt had to stay in the ED all night and explained that the reason for this was due to bed capacity on the floors. This RN also apologized that pt was left in his clothes and not changed into a gown. Pt wife states that an apology is not going to work and that she wants patient moved to a room now. This RN explained that patient is not able to be moved to an inpatient room at this time. Pt was changed into a gown by Philis Kendall, Therapist, sports.

## 2022-05-11 NOTE — ED Notes (Signed)
Pt with desats noted into the mid to upper 80's. Pt placed on 2L O2. MD notified.

## 2022-05-11 NOTE — ED Notes (Signed)
Heparin drip and bolus double verified with Tammy RN

## 2022-05-11 NOTE — ED Notes (Signed)
Pt updated on reported cath lab time of 1130. Pt and wife deny further questions or needs at this time.

## 2022-05-12 LAB — ECHOCARDIOGRAM COMPLETE
Area-P 1/2: 3.31 cm2
Height: 68 in
S' Lateral: 3.3 cm
Weight: 2320 oz

## 2022-05-12 LAB — BASIC METABOLIC PANEL
Anion gap: 8 (ref 5–15)
BUN: 17 mg/dL (ref 8–23)
CO2: 27 mmol/L (ref 22–32)
Calcium: 8.5 mg/dL — ABNORMAL LOW (ref 8.9–10.3)
Chloride: 104 mmol/L (ref 98–111)
Creatinine, Ser: 0.66 mg/dL (ref 0.61–1.24)
GFR, Estimated: >60 mL/min (ref >60)
Glucose, Bld: 98 mg/dL (ref 70–99)
Potassium: 3.9 mmol/L (ref 3.5–5.1)
Sodium: 139 mmol/L (ref 135–145)

## 2022-05-12 MED ORDER — NICOTINE POLACRILEX 2 MG MT GUM: 2.0000 mg | CHEWING_GUM | OROMUCOSAL | 0 refills | Status: DC | PRN

## 2022-05-12 MED ORDER — CLOPIDOGREL BISULFATE 75 MG PO TABS: 75.0000 mg | ORAL_TABLET | Freq: Every day | ORAL | 0 refills | Status: DC

## 2022-05-12 MED ORDER — ISOSORBIDE MONONITRATE ER 30 MG PO TB24: 30.0000 mg | ORAL_TABLET | Freq: Every day | ORAL | 0 refills | Status: DC

## 2022-05-12 MED ORDER — METOPROLOL SUCCINATE ER 25 MG PO TB24: 25.0000 mg | ORAL_TABLET | Freq: Every day | ORAL | 0 refills | Status: DC

## 2022-05-12 NOTE — Discharge Summary (Signed)
Physician Discharge Summary   Patient: Stephen Leon MRN: [id number]  DOB: October 10, 1945   Admit:     Date of Admission: 05/10/2022 Admitted from: home   Discharge: Date of discharge: 05/12/22 Disposition: Home Condition at discharge: good  CODE STATUS: FULL CODE     Discharge Physician: Emeterio Reeve, DO [facility]     PCP: Tracie Harrier, MD  Recommendations for Outpatient Follow-up:  Follow up with PCP Tracie Harrier, MD in 2-4 weeks Follow up w/ Dr Saralyn Pilar cardiologist in 1 week.  Please follow up on the following pending results: echocardiogram    Discharge Instructions     Amb Referral to Cardiac Rehabilitation   Complete by: As directed    Diagnosis: NSTEMI   After initial evaluation and assessments completed: Virtual Based Care may be provided alone or in conjunction with Phase 2 Cardiac Rehab based on patient barriers.: Yes   Intensive Cardiac Rehabilitation (ICR) [place] location only OR Traditional Cardiac Rehabilitation (TCR) *If criteria for ICR are not met will enroll in TCR [facility] only): No   Diet - low sodium heart healthy   Complete by: As directed    Increase activity slowly   Complete by: As directed          Discharge Diagnoses: Principal Problem:   NSTEMI (non-ST elevated myocardial infarction) ([place]) Active Problems:   Chronic pain syndrome   Tobacco user   Non-small cell carcinoma of lung ([place])   Essential (primary) hypertension   GAD (generalized anxiety disorder)   Chest pressure       Hospital Course: Stephen Leon is a 76 year old male with history of hypertension, neuropathy, hyperlipidemia, who presents emergency department from outpatient cardiology clinic for chief concerns of chest pain. 10/03: Initial vitals in  ED temperature of 98.2, RR 18, HR 69, BP 154/85, SPO2 of 92% on RA. Serum sodium is 137, potassium 3.7, chloride of 101, bicarb 26, BUN of 13, serum creatinine of 0.69,  nonfasting blood glucose is 104. High sensitive troponin is 1906.. WBC 9.7. Hemoglobin is 13.1, platelets of 223. ED treatment: Aspirin 162 mg p.o. one-time dose.  Heparin bolus and gtt. per pharmacy.  Cardiology consult: Continue heparin, DAPT (low-dose ASA plus Brilinta), atorvastatin, cardiac catheterization plan for tomorrow. 10/04: VSS.  Troponin to 1850.  Cardiac cath (+)occluded OM1, preservend LV function.  PCI of OM1 technically challenging and could jeopardize large dominant left circumflex.  Advise continue DAPT, start metoprolol succinate 25 mg daily, isosorbide mononitrate 30 mg daily, plan for discharge in the morning and follow-up in 1 week with cardiology 10/05: no overnight events noted. Cardiology cleared him for discharge and pt feels well today     Consultants:  Cardiology - [facility] clinic, Dr. Saralyn Pilar  Procedures: Cardiac catheterization 05/11/22: 1st Mrg lesion is 100% stenosed. Prox LAD lesion is 30% stenosed.  The left ventricular systolic function is normal. LV end diastolic pressure is mildly elevated. The left ventricular ejection fraction is 55-65% by visual estimate. No PCI was done d/t technical challenge and possibility of risk to large dominant left circumflex.       ASSESSMENT & PLAN:   Principal Problem:   NSTEMI (non-ST elevated myocardial infarction) ([place]) Active Problems:   Chronic pain syndrome   Tobacco user   Non-small cell carcinoma of lung (HCC)   Essential (primary) hypertension   GAD (generalized anxiety disorder)   Chest pressure  NSTEMI (non-ST elevated myocardial infarction) ([place]) Chest pressure presumed secondary to NSTEMI heparin bolus initially,  now off S/p cardiac cath as above  Complete echo pending read Per cardiology: continue DAPT, start metoprolol succinate 25 mg daily, isosorbide mononitrate 30 mg daily plan for discharge 10/05 and follow-up in 1 week with cardiology  GAD (generalized anxiety disorder) Lorazepam 0.5 mg  IV every 6 hours as needed for anxiety, 3 doses ordered  Essential (primary) hypertension Labetalol 5 mg IV every 3 hours as needed for SBP greater than 175, 4 doses ordered  Tobacco user Nicotine gum (Nicorette) as needed ordered per patient's request Patient declined nicotine patch         Discharge Instructions  Allergies as of 05/12/2022   No Known Allergies      Medication List     STOP taking these medications    ibuprofen 800 MG tablet Commonly known as: ADVIL       TAKE these medications    aspirin EC 81 MG tablet Take 81 mg by mouth daily. Swallow whole.   atorvastatin 80 MG tablet Commonly known as: LIPITOR Take 80 mg by mouth daily.   chlorpheniramine-HYDROcodone 10-8 MG/5ML Commonly known as: TUSSIONEX Take 5 mLs by mouth at bedtime as needed.   clopidogrel 75 MG tablet Commonly known as: PLAVIX Take 1 tablet (75 mg total) by mouth daily.   gabapentin 800 MG tablet Commonly known as: NEURONTIN Take 800 mg by mouth 2 (two) times daily.   ipratropium-albuterol 0.5-2.5 (3) MG/3ML Soln Commonly known as: DUONEB Inhale 3 mLs into the lungs every 4 (four) hours as needed (When he get sick and the doctor tells him to take it).   isosorbide mononitrate 30 MG 24 hr tablet Commonly known as: IMDUR Take 1 tablet (30 mg total) by mouth daily.   metoprolol succinate 25 MG 24 hr tablet Commonly known as: TOPROL-XL Take 1 tablet (25 mg total) by mouth daily.   nicotine polacrilex 2 MG gum Commonly known as: NICORETTE Take 1 each (2 mg total) by mouth as needed for smoking cessation.   tamsulosin 0.4 MG Caps capsule Commonly known as: FLOMAX Take 0.4 mg by mouth at bedtime.   Trelegy Ellipta 100-62.5-25 MCG/ACT Aepb Generic drug: Fluticasone-Umeclidin-Vilant Inhale 1 puff into the lungs daily.         Follow-up Information     Paraschos, Jayleen Scaglione, MD. Go in 1 week(s).   Specialty: Cardiology Contact information: [facility] [place] [address] [phone]                 No Known Allergies   Subjective: no chest pain or SOB, pt feelign well and ok to go home    Discharge Exam: BP 116/62 (BP Location: Left Arm)   Pulse 63   Temp 98 F (36.7 C)   Resp 18   Ht _0  (1.727 m)   Wt 65.8 kg   SpO2 95%   BMI 22.05 kg/m  General: Pt is alert, awake, not in acute distress Cardiovascular: RRR, S1/S2 +, no rubs, no gallops Respiratory: CTA bilaterally, no wheezing, no rhonchi Abdominal: Soft, NT, ND, bowel sounds + Extremities: no edema, no cyanosis     The results of significant diagnostics from this hospitalization (including imaging, microbiology, ancillary and laboratory) are listed below for reference.     Microbiology: Recent Results (from the past 240 hour(s))  Resp Panel by RT-PCR (Flu A&B, Covid) Anterior Nasal Swab     Status: None   Collection Time: 05/10/22  4:01 PM   Specimen: Anterior Nasal Swab  Result Value Ref Range Status   SARS Coronavirus 2 by RT PCR NEGATIVE NEGATIVE Final    Comment: (NOTE) SARS-CoV-2 target nucleic acids are NOT DETECTED.  The SARS-CoV-2 RNA is generally detectable in upper respiratory specimens during the acute phase of infection. The lowest concentration of SARS-CoV-2 viral copies this assay can detect is 138 copies/mL. A negative result does not preclude SARS-Cov-2 infection and should not be used as the sole basis for treatment or other patient management decisions. A negative result may occur with  improper specimen collection/handling, submission of specimen other than nasopharyngeal swab, presence of viral mutation(s) within the areas targeted by this assay, and inadequate number of viral copies(<138 copies/mL). A negative result must be combined with clinical observations, patient history, and epidemiological information. The expected result is Negative.  Fact Sheet for Patients:   EntrepreneurPulse.com.au  Fact Sheet for Healthcare Providers:  IncredibleEmployment.be  This test is no t yet approved or cleared by the [place] FDA and  has been authorized for detection and/or diagnosis of SARS-CoV-2 by FDA under an Emergency Use Authorization (EUA). This EUA will remain  in effect (meaning this test can be used) for the duration of the COVID-19 declaration under Section 564(b)(1) of the Act, 21 U.S.C.section 360bbb-3(b)(1), unless the authorization is terminated  or revoked sooner.       Influenza A by PCR NEGATIVE NEGATIVE Final   Influenza B by PCR NEGATIVE NEGATIVE Final    Comment: (NOTE) The Xpert Xpress SARS-CoV-2/FLU/RSV plus assay is intended as an aid in the diagnosis of influenza from Nasopharyngeal swab specimens and should not be used as a sole basis for treatment. Nasal washings and aspirates are unacceptable for Xpert Xpress SARS-CoV-2/FLU/RSV testing.  Fact Sheet for Patients: EntrepreneurPulse.com.au  Fact Sheet for Healthcare Providers: IncredibleEmployment.be  This test is not yet approved or cleared by the [place] FDA and has been authorized for detection and/or diagnosis of SARS-CoV-2 by FDA under an Emergency Use Authorization (EUA). This EUA will remain in effect (meaning this test can be used) for the duration of the COVID-19 declaration under Section 564(b)(1) of the Act, 21 U.S.C. section 360bbb-3(b)(1), unless the authorization is terminated or revoked.  Performed at [facility], [address] [place], [place] [address]      Labs: BNP (last 3 results) No results for input(s): "BNP" in the last 8760 hours. Basic Metabolic Panel: Recent Labs  Lab 05/10/22 1510 05/11/22 0635 05/12/22 0537  NA 137 138 139  K 3.7 4.1 3.9  CL 101 106 104  CO2 _0 GLUCOSE 104* 98 98  BUN _1 CREATININE 0.69 0.70 0.66  CALCIUM  9.1 8.8* 8.5*   Liver Function Tests: No results for input(s): "AST", "ALT", "ALKPHOS", "BILITOT", "PROT", "ALBUMIN" in the last 168 hours. No results for input(s): "LIPASE", "AMYLASE" in the last 168 hours. No results for input(s): "AMMONIA" in the last 168 hours. CBC: Recent Labs  Lab 05/10/22 1510 05/11/22 0635  WBC 9.7 9.3  HGB 13.1 12.9*  HCT 40.1 39.4  MCV 100.0 99.5  PLT 223 212   Cardiac Enzymes: No results for input(s): "CKTOTAL", "CKMB", "CKMBINDEX", "TROPONINI" in the last 168 hours. BNP: Invalid input(s): "POCBNP" CBG: No results for input(s): "GLUCAP" in the last 168 hours. D-Dimer No results for input(s): "DDIMER" in the last 72 hours. Hgb A1c No results for input(s): "HGBA1C" in the last 72 hours. Lipid Profile No results for input(s): "CHOL", "HDL", "LDLCALC", "TRIG", "CHOLHDL", "LDLDIRECT" in  the last 72 hours. Thyroid function studies No results for input(s): "TSH", "T4TOTAL", "T3FREE", "THYROIDAB" in the last 72 hours.  Invalid input(s): "FREET3" Anemia work up No results for input(s): "VITAMINB12", "FOLATE", "FERRITIN", "TIBC", "IRON", "RETICCTPCT" in the last 72 hours. Urinalysis No results found for: "COLORURINE", "APPEARANCEUR", "LABSPEC", "PHURINE", "GLUCOSEU", "HGBUR", "BILIRUBINUR", "KETONESUR", "PROTEINUR", "UROBILINOGEN", "NITRITE", "LEUKOCYTESUR" Sepsis Labs Recent Labs  Lab 05/10/22 1510 05/11/22 0635  WBC 9.7 9.3   Microbiology Recent Results (from the past 240 hour(s))  Resp Panel by RT-PCR (Flu A&B, Covid) Anterior Nasal Swab     Status: None   Collection Time: 05/10/22  4:01 PM   Specimen: Anterior Nasal Swab  Result Value Ref Range Status   SARS Coronavirus 2 by RT PCR NEGATIVE NEGATIVE Final    Comment: (NOTE) SARS-CoV-2 target nucleic acids are NOT DETECTED.  The SARS-CoV-2 RNA is generally detectable in upper respiratory specimens during the acute phase of infection. The lowest concentration of SARS-CoV-2 viral copies this  assay can detect is 138 copies/mL. A negative result does not preclude SARS-Cov-2 infection and should not be used as the sole basis for treatment or other patient management decisions. A negative result may occur with  improper specimen collection/handling, submission of specimen other than nasopharyngeal swab, presence of viral mutation(s) within the areas targeted by this assay, and inadequate number of viral copies(<138 copies/mL). A negative result must be combined with clinical observations, patient history, and epidemiological information. The expected result is Negative.  Fact Sheet for Patients:  EntrepreneurPulse.com.au  Fact Sheet for Healthcare Providers:  IncredibleEmployment.be  This test is no t yet approved or cleared by the [place] FDA and  has been authorized for detection and/or diagnosis of SARS-CoV-2 by FDA under an Emergency Use Authorization (EUA). This EUA will remain  in effect (meaning this test can be used) for the duration of the COVID-19 declaration under Section 564(b)(1) of the Act, 21 U.S.C.section 360bbb-3(b)(1), unless the authorization is terminated  or revoked sooner.       Influenza A by PCR NEGATIVE NEGATIVE Final   Influenza B by PCR NEGATIVE NEGATIVE Final    Comment: (NOTE) The Xpert Xpress SARS-CoV-2/FLU/RSV plus assay is intended as an aid in the diagnosis of influenza from Nasopharyngeal swab specimens and should not be used as a sole basis for treatment. Nasal washings and aspirates are unacceptable for Xpert Xpress SARS-CoV-2/FLU/RSV testing.  Fact Sheet for Patients: EntrepreneurPulse.com.au  Fact Sheet for Healthcare Providers: IncredibleEmployment.be  This test is not yet approved or cleared by the [place] FDA and has been authorized for detection and/or diagnosis of SARS-CoV-2 by FDA under an Emergency Use Authorization (EUA). This EUA will  remain in effect (meaning this test can be used) for the duration of the COVID-19 declaration under Section 564(b)(1) of the Act, 21 U.S.C. section 360bbb-3(b)(1), unless the authorization is terminated or revoked.  Performed at [facility], [address] [place], [place] [address]    Imaging CARDIAC CATHETERIZATION  Result Date: 05/11/2022   1st Mrg lesion is 100% stenosed.   Prox LAD lesion is 30% stenosed.   The left ventricular systolic function is normal.   LV end diastolic pressure is mildly elevated.   The left ventricular ejection fraction is 55-65% by visual estimate. 1.  NSTEMI, late presentation (1 week ) 2.  Occluded OM1 3.  Preserved left ventricular function Recommendations 1.  Medical therapy.  PCI of occluded OM1 be technically challenging, and could jeopardize large dominant left circumflex. 2.  Continue dual antiplatelet therapy 3.  Start metoprolol succinate 25 mg daily 4.  Start isosorbide mononitrate 30 mg daily 5.  Plan for discharge in a.m. 6.  Follow-up 1 week after discharge      Time coordinating discharge: over 30 minutes  SIGNED:  Emeterio Reeve DO [facility]

## 2022-05-12 NOTE — Progress Notes (Signed)
[facility] Cardiology  CARDIOLOGY CONSULT NOTE  Patient ID: Stephen Leon MRN: [id number] DOB/AGE: 12/29/1945 76 y.o.  Admit date: 05/10/2022 Referring Physician [facility] Primary Physician [facility] Primary Cardiologist Paraschos Reason for Consultation NSTEMI  HPI: 76 year old gentleman referred for evaluation of NSTEMI.  She has a history of COPD, status post resection right upper lobe carcinoma 2013.  He has known coronary artery disease, underwent cardiac catheterization 05/19/2020 which revealed 75% stenosis OM 2.  FFR was negative and the patient was treated medically.  The patient was visiting [place] last week, experienced chest pain at rest, evaluated at local emergency room where he reportedly had elevated troponin greater than 2000 but refused to be admitted for further work-up and was discharged on Brilinta.  He returned home yesterday, presented to the office, where he continued to have recurrent episodes of chest pain with minimal levels of exertion such as brushing his teeth.  ECG was nondiagnostic.  The patient was sent over to [facility] ED where high-sensitivity troponin was elevated to 1906.    Interval history:  -S/p LHC with Dr. Saralyn Pilar 10/4 which revealed an occluded OM1 for which PCI was not recommended, medical therapy planned and starting on beta-blocker, Imdur in addition to DAPT.  LV gram with estimated preserved LVEF -Formal echo performed yesterday evening, pending read -denies chest pain, shortness of breath, pain or bleeding from his R wrist.  -eager to go home   Review of systems complete and found to be negative unless listed above     Past Medical History:  Diagnosis Date   Anxiety    Bronchitis    Cancer ([place]) 2016   COPD (chronic obstructive pulmonary disease) ([place])    Cough 07/13/2007   Qualifier: Diagnosis of  By: Wynona Luna    Dyspnea    due to copd   Hypertension    pt denies this and is not on bp meds but vascular note states htn   LUNG NODULE  07/13/2007   Qualifier: Diagnosis of  By: Wynona Luna    Pneumonia 2013   Thoracic aortic aneurysm [facility])    followed by vascular    Past Surgical History:  Procedure Laterality Date   DIAGNOSTIC LAPAROSCOPY     EYE SURGERY Bilateral    HERNIA REPAIR     INTRAVASCULAR PRESSURE WIRE/FFR STUDY N/A 05/19/2020   Procedure: INTRAVASCULAR PRESSURE WIRE/FFR STUDY;  Surgeon: Isaias Cowman, MD;  Location: [place] CV LAB;  Service: Cardiovascular;  Laterality: N/A;   KYPHOPLASTY N/A 12/13/2018   Procedure: KYPHOPLASTY- T7;  Surgeon: Hessie Knows, MD;  Location: [facility] ORS;  Service: Orthopedics;  Laterality: N/A;   LEFT HEART CATH AND CORONARY ANGIOGRAPHY Left 05/19/2020   Procedure: LEFT HEART CATH AND CORONARY ANGIOGRAPHY;  Surgeon: Isaias Cowman, MD;  Location: [place] CV LAB;  Service: Cardiovascular;  Laterality: Left;   LUNG LOBECTOMY Right    upper lung due to asbestos exposure   XI ROBOTIC ASSISTED INGUINAL HERNIA REPAIR WITH MESH Right 07/19/2019   Procedure: XI ROBOTIC ASSISTED INGUINAL HERNIA REPAIR WITH MESH;  Surgeon: Benjamine Sprague, DO;  Location: [facility] ORS;  Service: General;  Laterality: Right;    Medications Prior to Admission  Medication Sig Dispense Refill Last Dose   aspirin EC 81 MG tablet Take 81 mg by mouth daily. Swallow whole.   05/09/2022   atorvastatin (LIPITOR) 80 MG tablet Take 80 mg by mouth daily.   05/09/2022   gabapentin (NEURONTIN) 800 MG tablet Take 800 mg by mouth  2 (two) times daily.    05/10/2022 at 0600   ibuprofen (ADVIL) 800 MG tablet Take 1 tablet (800 mg total) by mouth every 8 (eight) hours as needed for mild pain or moderate pain. (Patient taking differently: Take 800 mg by mouth 2 (two) times daily.) 30 tablet 0 05/10/2022   TRELEGY ELLIPTA 100-62.5-25 MCG/INH AEPB Inhale 1 puff into the lungs daily.   05/09/2022   chlorpheniramine-HYDROcodone (TUSSIONEX) 10-8 MG/5ML Take 5 mLs by mouth at bedtime as needed.       ipratropium-albuterol (DUONEB) 0.5-2.5 (3) MG/3ML SOLN Inhale 3 mLs into the lungs every 4 (four) hours as needed (When he get sick and the doctor tells him to take it).    prn   tamsulosin (FLOMAX) 0.4 MG CAPS capsule Take 0.4 mg by mouth at bedtime.       Social History   Socioeconomic History   Marital status: Married    Spouse name: Not on file   Number of children: Not on file   Years of education: Not on file   Highest education level: Not on file  Occupational History   Not on file  Tobacco Use   Smoking status: Every Day    Packs/day: 0.50    Years: 50.00    Total pack years: 25.00    Types: Cigarettes   Smokeless tobacco: Never  Vaping Use   Vaping Use: Never used  Substance and Sexual Activity   Alcohol use: Yes    Alcohol/week: 12.0 standard drinks of alcohol    Types: 12 Standard drinks or equivalent per week    Comment: Drinks 3-4 beers every night   Drug use: No   Sexual activity: Not on file  Other Topics Concern   Not on file  Social History Narrative   Not on file   Social Determinants of Health   Financial Resource Strain: Not on file  Food Insecurity: No Food Insecurity (05/11/2022)   Hunger Vital Sign    Worried About Running Out of Food in the Last Year: Never true    Ran Out of Food in the Last Year: Never true  Transportation Needs: No Transportation Needs (05/11/2022)   PRAPARE - Hydrologist (Medical): No    Lack of Transportation (Non-Medical): No  Physical Activity: Not on file  Stress: Not on file  Social Connections: Not on file  Intimate Partner Violence: Not At Risk (05/11/2022)   Humiliation, Afraid, Rape, and Kick questionnaire    Fear of Current or Ex-Partner: No    Emotionally Abused: No    Physically Abused: No    Sexually Abused: No    Family History  Problem Relation Age of Onset   COPD Mother       PHYSICAL EXAM General: Pleasant elderly caucasian male, well nourished, in no acute distress.   HEENT:  Normocephalic and atraumatic. Neck:   No JVD.  Lungs: Normal respiratory effort on room air. Decreased breath sounds bilaterally without appreciable crackles or wheezes. Heart: HRRR . Normal S1 and S2 without gallops or murmurs.  Abdomen: non-distended appearing.  Msk: Normal strength and tone for age. Extremities: No clubbing, cyanosis, edema. R wrist with trace ecchymosis without tenderness to palpation, gauze and tegaderm in place Neuro: Alert and oriented x3 Psych:  mood appropriate for situation.    Labs:   Lab Results  Component Value Date   WBC 9.3 05/11/2022   HGB 12.9 (L) 05/11/2022   HCT 39.4 05/11/2022   MCV  99.5 05/11/2022   PLT 212 05/11/2022    Recent Labs  Lab 05/12/22 0537  NA 139  K 3.9  CL 104  CO2 27  BUN 17  CREATININE 0.66  CALCIUM 8.5*  GLUCOSE 98    Lab Results  Component Value Date   TROPONINI <0.03 12/10/2018    Lab Results  Component Value Date   CHOL 191 07/06/2007   Lab Results  Component Value Date   HDL 59.5 07/06/2007   Lab Results  Component Value Date   LDLCALC 99 07/06/2007   Lab Results  Component Value Date   TRIG 165 (H) 07/06/2007   Lab Results  Component Value Date   CHOLHDL 3.2 CALC 07/06/2007   No results found for: "LDLDIRECT"    Radiology: CARDIAC CATHETERIZATION  Result Date: 05/11/2022   1st Mrg lesion is 100% stenosed.   Prox LAD lesion is 30% stenosed.   The left ventricular systolic function is normal.   LV end diastolic pressure is mildly elevated.   The left ventricular ejection fraction is 55-65% by visual estimate. 1.  NSTEMI, late presentation (1 week ) 2.  Occluded OM1 3.  Preserved left ventricular function Recommendations 1.  Medical therapy.  PCI of occluded OM1 be technically challenging, and could jeopardize large dominant left circumflex. 2.  Continue dual antiplatelet therapy 3.  Start metoprolol succinate 25 mg daily 4.  Start isosorbide mononitrate 30 mg daily 5.  Plan for discharge in  a.m. 6.  Follow-up 1 week after discharge   DG Chest 2 View  Result Date: 05/10/2022 CLINICAL DATA:  Chest pain, shortness of breath EXAM: CHEST - 2 VIEW COMPARISON:  04/23/2021, 12/10/2018 FINDINGS: Frontal and lateral views of the chest demonstrate a stable cardiac silhouette. Stable ectasia and atherosclerosis of the thoracic aorta. No airspace disease, effusion, or pneumothorax. Bilateral calcified pleural plaques unchanged. Prior T7 fracture and vertebral augmentation. No acute displaced fracture. IMPRESSION: 1. Stable chest, no acute intrathoracic process. Electronically Signed   By: Randa Ngo M.D.   On: 05/10/2022 15:29    EKG: Sinus rhythm, RVH, without ischemic ST-T wave changes  Telemetry: Sinus rhythm 60s to 70s  Data reviewed by me: Hospitalist progress note, nursing notes, CBC, BMP, troponins, ordered echo, telemetry, vitals, chest x-ray  ASSESSMENT AND PLAN:   1.  NSTEMI, high-sensitivity troponin 1906, with persistent chest pain with minimal of exertion, chest pain-free on admission 2.  One-vessel CAD with 100% OM1 not amenable to PCI, 30% proximal LAD with a mated LVEF 55-65% by LV gram 3.  COPD, 4.  Tobacco abuse, 60 pack year history, currently smoking 1-1.5 PPD  Recommendations  1.  Agree with current therapy 2.  S/p heparin infusion 3.  Continue DAPT with low-dose aspirin and Brilinta 4.  Continue high intensity atorvastatin 5.  Continue metoprolol XL 25 mg once daily 6.  Continue isosorbide 30 mg once daily 7.  Cardiac rehab at discharge. 8.  Discussed smoking cessation in detail with patient, he is somewhat motivated to quit. Considering resuming chantix.  9.  Okay for discharge today from a cardiac standpoint, follow-up with Dr. Saralyn Pilar in office in 1 to 2 weeks. Follow echo results at this visit.   This patient's plan of care was discussed and created with Dr. Saralyn Pilar and he is in agreement.    Signed: Alanson Puls Analiya Porco PA-C 05/12/2022, 8:45  AM

## 2022-05-12 NOTE — Progress Notes (Signed)
  Transition of Care [facility]) Screening Note   Patient Details  Name: Stephen Leon Date of Birth: August 08, 1946   Transition of Care [facility]) CM/SW Contact:    Alberteen Sam, LCSW Phone Number: 05/12/2022, 11:20 AM    Transition of Care Department [facility]) has reviewed patient and no TOC needs have been identified at this time. We will continue to monitor patient advancement through interdisciplinary progression rounds. If new patient transition needs arise, please place a TOC consult.  [place], [place]

## 2022-05-13 ENCOUNTER — Encounter: Payer: Self-pay | Admitting: Cardiology

## 2022-05-13 LAB — LIPOPROTEIN A (LPA): Lipoprotein (a): 20.2 nmol/L (ref <75.0)

## 2022-05-17 DIAGNOSIS — I2510 Atherosclerotic heart disease of native coronary artery without angina pectoris: Secondary | ICD-10-CM | POA: Diagnosis not present

## 2022-05-17 DIAGNOSIS — J441 Chronic obstructive pulmonary disease with (acute) exacerbation: Secondary | ICD-10-CM | POA: Diagnosis not present

## 2022-05-23 DIAGNOSIS — J418 Mixed simple and mucopurulent chronic bronchitis: Secondary | ICD-10-CM | POA: Diagnosis not present

## 2022-05-23 DIAGNOSIS — I2510 Atherosclerotic heart disease of native coronary artery without angina pectoris: Secondary | ICD-10-CM | POA: Diagnosis not present

## 2022-05-23 DIAGNOSIS — Z9889 Other specified postprocedural states: Secondary | ICD-10-CM | POA: Insufficient documentation

## 2022-05-23 DIAGNOSIS — I10 Essential (primary) hypertension: Secondary | ICD-10-CM | POA: Diagnosis not present

## 2022-05-23 DIAGNOSIS — R0602 Shortness of breath: Secondary | ICD-10-CM | POA: Diagnosis not present

## 2022-05-26 DIAGNOSIS — R053 Chronic cough: Secondary | ICD-10-CM | POA: Diagnosis not present

## 2022-05-26 DIAGNOSIS — I2510 Atherosclerotic heart disease of native coronary artery without angina pectoris: Secondary | ICD-10-CM | POA: Diagnosis not present

## 2022-05-26 DIAGNOSIS — Z720 Tobacco use: Secondary | ICD-10-CM | POA: Diagnosis not present

## 2022-05-26 DIAGNOSIS — D649 Anemia, unspecified: Secondary | ICD-10-CM | POA: Diagnosis not present

## 2022-05-26 DIAGNOSIS — G8912 Acute post-thoracotomy pain: Secondary | ICD-10-CM | POA: Diagnosis not present

## 2022-05-26 DIAGNOSIS — J432 Centrilobular emphysema: Secondary | ICD-10-CM | POA: Diagnosis not present

## 2022-05-26 DIAGNOSIS — I700 Atherosclerosis of aorta: Secondary | ICD-10-CM | POA: Diagnosis not present

## 2022-05-26 DIAGNOSIS — Z902 Acquired absence of lung [part of]: Secondary | ICD-10-CM | POA: Diagnosis not present

## 2022-05-26 DIAGNOSIS — C801 Malignant (primary) neoplasm, unspecified: Secondary | ICD-10-CM | POA: Diagnosis not present

## 2022-06-02 DIAGNOSIS — F17210 Nicotine dependence, cigarettes, uncomplicated: Secondary | ICD-10-CM | POA: Diagnosis not present

## 2022-06-02 DIAGNOSIS — J440 Chronic obstructive pulmonary disease with acute lower respiratory infection: Secondary | ICD-10-CM | POA: Diagnosis not present

## 2022-06-02 DIAGNOSIS — J470 Bronchiectasis with acute lower respiratory infection: Secondary | ICD-10-CM | POA: Diagnosis not present

## 2022-06-02 DIAGNOSIS — F4321 Adjustment disorder with depressed mood: Secondary | ICD-10-CM | POA: Diagnosis not present

## 2022-06-02 DIAGNOSIS — D649 Anemia, unspecified: Secondary | ICD-10-CM | POA: Diagnosis not present

## 2022-06-02 DIAGNOSIS — G8912 Acute post-thoracotomy pain: Secondary | ICD-10-CM | POA: Diagnosis not present

## 2022-06-02 DIAGNOSIS — Z0000 Encounter for general adult medical examination without abnormal findings: Secondary | ICD-10-CM | POA: Diagnosis not present

## 2022-06-02 DIAGNOSIS — I2510 Atherosclerotic heart disease of native coronary artery without angina pectoris: Secondary | ICD-10-CM | POA: Diagnosis not present

## 2022-06-02 DIAGNOSIS — Z85118 Personal history of other malignant neoplasm of bronchus and lung: Secondary | ICD-10-CM | POA: Diagnosis not present

## 2022-06-02 DIAGNOSIS — F419 Anxiety disorder, unspecified: Secondary | ICD-10-CM | POA: Diagnosis not present

## 2022-07-20 DIAGNOSIS — D649 Anemia, unspecified: Secondary | ICD-10-CM | POA: Diagnosis not present

## 2022-07-20 DIAGNOSIS — R195 Other fecal abnormalities: Secondary | ICD-10-CM | POA: Diagnosis not present

## 2022-08-09 DIAGNOSIS — I2510 Atherosclerotic heart disease of native coronary artery without angina pectoris: Secondary | ICD-10-CM | POA: Diagnosis not present

## 2022-08-09 DIAGNOSIS — I10 Essential (primary) hypertension: Secondary | ICD-10-CM | POA: Diagnosis not present

## 2022-08-09 DIAGNOSIS — J441 Chronic obstructive pulmonary disease with (acute) exacerbation: Secondary | ICD-10-CM | POA: Diagnosis not present

## 2022-08-29 DIAGNOSIS — L270 Generalized skin eruption due to drugs and medicaments taken internally: Secondary | ICD-10-CM | POA: Insufficient documentation

## 2022-08-29 DIAGNOSIS — R0602 Shortness of breath: Secondary | ICD-10-CM | POA: Diagnosis not present

## 2022-08-29 DIAGNOSIS — I25118 Atherosclerotic heart disease of native coronary artery with other forms of angina pectoris: Secondary | ICD-10-CM | POA: Diagnosis not present

## 2022-08-29 DIAGNOSIS — Z720 Tobacco use: Secondary | ICD-10-CM | POA: Diagnosis not present

## 2022-08-29 DIAGNOSIS — I712 Thoracic aortic aneurysm, without rupture, unspecified: Secondary | ICD-10-CM | POA: Diagnosis not present

## 2022-10-14 DIAGNOSIS — C3490 Malignant neoplasm of unspecified part of unspecified bronchus or lung: Secondary | ICD-10-CM | POA: Diagnosis not present

## 2022-10-14 DIAGNOSIS — F411 Generalized anxiety disorder: Secondary | ICD-10-CM | POA: Diagnosis not present

## 2022-10-14 DIAGNOSIS — I2510 Atherosclerotic heart disease of native coronary artery without angina pectoris: Secondary | ICD-10-CM | POA: Diagnosis not present

## 2022-10-14 DIAGNOSIS — Z0000 Encounter for general adult medical examination without abnormal findings: Secondary | ICD-10-CM | POA: Diagnosis not present

## 2022-10-14 DIAGNOSIS — R0602 Shortness of breath: Secondary | ICD-10-CM | POA: Diagnosis not present

## 2022-10-14 DIAGNOSIS — R7309 Other abnormal glucose: Secondary | ICD-10-CM | POA: Diagnosis not present

## 2022-10-21 DIAGNOSIS — I2510 Atherosclerotic heart disease of native coronary artery without angina pectoris: Secondary | ICD-10-CM | POA: Diagnosis not present

## 2022-10-21 DIAGNOSIS — J440 Chronic obstructive pulmonary disease with acute lower respiratory infection: Secondary | ICD-10-CM | POA: Diagnosis not present

## 2022-10-21 DIAGNOSIS — I700 Atherosclerosis of aorta: Secondary | ICD-10-CM | POA: Diagnosis not present

## 2022-10-21 DIAGNOSIS — Z85118 Personal history of other malignant neoplasm of bronchus and lung: Secondary | ICD-10-CM | POA: Diagnosis not present

## 2022-10-21 DIAGNOSIS — Z1331 Encounter for screening for depression: Secondary | ICD-10-CM | POA: Diagnosis not present

## 2022-10-21 DIAGNOSIS — F419 Anxiety disorder, unspecified: Secondary | ICD-10-CM | POA: Diagnosis not present

## 2022-10-21 DIAGNOSIS — G894 Chronic pain syndrome: Secondary | ICD-10-CM | POA: Diagnosis not present

## 2022-10-21 DIAGNOSIS — Z0000 Encounter for general adult medical examination without abnormal findings: Secondary | ICD-10-CM | POA: Diagnosis not present

## 2022-10-21 DIAGNOSIS — F17210 Nicotine dependence, cigarettes, uncomplicated: Secondary | ICD-10-CM | POA: Diagnosis not present

## 2022-10-26 NOTE — H&P (Signed)
Pre-Procedure H&P   Patient ID: Stephen Leon is a 77 y.o. male.  Gastroenterology Provider: Annamaria Helling, DO  Referring Provider: Laurine Blazer, PA PCP: Tracie Harrier, MD  Date: 10/27/2022  HPI [name] is a 77 y.o. male who presents today for Esophagogastroduodenoscopy and Colonoscopy for Positive FIT testing .  Patient was found to have positive FIT testing.  He reports regular bowel movements daily without melena hematochezia diarrhea or constipation.  No abdominal pain.  Appetite and weight have been stable.  Denies any nausea or vomiting.  Smokes half pack of tobacco per day.  4 beers per day. No family history of colorectal cancer or colon polyps Previously taking 100 mg of ibuprofen 3 times a day through October 2023 but has since stopped  History of NSTEMI in October 2023.  History of lung cancer status post lobectomy in 2013..  Most recent labs hemoglobin 14.5 MCV 101.6 platelets 289,000 iron saturation 22% ferritin 272 TIBC 329 B12 within normal limits  Never previously undergone EGD or colonoscopy   Past Medical History:  Diagnosis Date   Anxiety    Bronchitis    Cancer ([place]) 2016   COPD (chronic obstructive pulmonary disease) ([place])    Cough 07/13/2007   Qualifier: Diagnosis of  By: Wynona Luna    Dyspnea    due to copd   Hypertension    pt denies this and is not on bp meds but vascular note states htn   LUNG NODULE 07/13/2007   Qualifier: Diagnosis of  By: Wynona Luna    Pneumonia 2013   Thoracic aortic aneurysm [facility])    followed by vascular    Past Surgical History:  Procedure Laterality Date   DIAGNOSTIC LAPAROSCOPY     EYE SURGERY Bilateral    HERNIA REPAIR     INTRAVASCULAR PRESSURE WIRE/FFR STUDY N/A 05/19/2020   Procedure: INTRAVASCULAR PRESSURE WIRE/FFR STUDY;  Surgeon: Isaias Cowman, MD;  Location: [place] [facility];  Service: Cardiovascular;  Laterality: N/A;   KYPHOPLASTY N/A  12/13/2018   Procedure: KYPHOPLASTY- T7;  Surgeon: Hessie Knows, MD;  Location: [facility] OR[facility];  Service: Orthopedics;  Laterality: N/A;   LEFT HEART CATH N/A 05/11/2022   Procedure: Left Heart Cath;  Surgeon: Isaias Cowman, MD;  Location: [place] [facility];  Service: Cardiovascular;  Laterality: N/A;   LEFT HEART CATH AND CORONARY ANGIOGRAPHY Left 05/19/2020   Procedure: LEFT HEART CATH AND CORONARY ANGIOGRAPHY;  Surgeon: Isaias Cowman, MD;  Location: [place] [facility];  Service: Cardiovascular;  Laterality: Left;   LUNG LOBECTOMY Right    upper lung due to asbestos exposure   XI ROBOTIC ASSISTED INGUINAL HERNIA REPAIR WITH MESH Right 07/19/2019   Procedure: XI ROBOTIC ASSISTED INGUINAL HERNIA REPAIR WITH MESH;  Surgeon: Benjamine Sprague, DO;  Location: [facility] ORS;  Service: General;  Laterality: Right;    Family History No h/o GI disease or malignancy  Review of Systems  Constitutional:  Negative for activity change, appetite change, chills, diaphoresis, fatigue, fever and unexpected weight change.  HENT:  Negative for trouble swallowing and voice change.   Respiratory:  Negative for shortness of breath and wheezing.   Cardiovascular:  Negative for chest pain, palpitations and leg swelling.  Gastrointestinal:  Negative for abdominal distention, abdominal pain, anal bleeding, blood in stool, constipation, diarrhea, nausea and vomiting.  Musculoskeletal:  Negative for arthralgias and myalgias.  Skin:  Negative for color change and pallor.  Neurological:  Negative for dizziness,  syncope and weakness.  Psychiatric/Behavioral:  Negative for confusion. The patient is not nervous/anxious.   All other systems reviewed and are negative.    Medications No current facility-administered medications on file prior to encounter.   Current Outpatient Medications on File Prior to Encounter  Medication Sig Dispense Refill   gabapentin (NEURONTIN) 800 MG tablet Take 800 mg by mouth 2 (two)  times daily.      ibuprofen (ADVIL) 800 MG tablet Take 800 mg by mouth every 8 (eight) hours as needed for mild pain.     aspirin EC 81 MG tablet Take 81 mg by mouth daily. Swallow whole.     atorvastatin (LIPITOR) 80 MG tablet Take 80 mg by mouth daily. (Patient not taking: Reported on 10/27/2022)     chlorpheniramine-HYDROcodone (TUSSIONEX) 10-8 MG/5ML Take 5 mLs by mouth at bedtime as needed.     clopidogrel (PLAVIX) 75 MG tablet Take 1 tablet (75 mg total) by mouth daily. (Patient not taking: Reported on 10/27/2022) 30 tablet 0   ipratropium-albuterol (DUONEB) 0.5-2.5 (3) MG/3ML SOLN Inhale 3 mLs into the lungs every 4 (four) hours as needed (When he get sick and the doctor tells him to take it).  (Patient not taking: Reported on 10/27/2022)     isosorbide mononitrate (IMDUR) 30 MG 24 hr tablet Take 1 tablet (30 mg total) by mouth daily. (Patient not taking: Reported on 10/27/2022) 30 tablet 0   metoprolol succinate (TOPROL-XL) 25 MG 24 hr tablet Take 1 tablet (25 mg total) by mouth daily. (Patient not taking: Reported on 10/27/2022) 30 tablet 0   nicotine polacrilex (NICORETTE) 2 MG gum Take 1 each (2 mg total) by mouth as needed for smoking cessation. (Patient not taking: Reported on 10/27/2022) 100 tablet 0   tamsulosin (FLOMAX) 0.4 MG CAPS capsule Take 0.4 mg by mouth at bedtime.     TRELEGY ELLIPTA 100-62.5-25 MCG/INH AEPB Inhale 1 puff into the lungs daily.      Pertinent medications related to GI and procedure were reviewed by me with the patient prior to the procedure   Current Facility-Administered Medications:    0.9 %  sodium chloride infusion, , Intravenous, Continuous, Annamaria Helling, DO      No Known Allergies Allergies were reviewed by me prior to the procedure  Objective   Body mass index is 21.46 kg/m. Vitals:   10/27/22 0703  BP: 117/75  Pulse: 68  Resp: 18  Temp: (!) 97.1 F (36.2 C)  TempSrc: Temporal  SpO2: 95%  Weight: 62.1 kg  Height: 5\' 7"  (1.702 m)      Physical Exam Vitals and nursing note reviewed.  Constitutional:      General: He is not in acute distress.    Appearance: Normal appearance. He is not ill-appearing, toxic-appearing or diaphoretic.  HENT:     Head: Normocephalic and atraumatic.     Nose: Nose normal.     Mouth/Throat:     Mouth: Mucous membranes are moist.     Pharynx: Oropharynx is clear.  Eyes:     General: No scleral icterus.    Extraocular Movements: Extraocular movements intact.  Cardiovascular:     Rate and Rhythm: Normal rate and regular rhythm.     Heart sounds: Normal heart sounds. No murmur heard.    No friction rub. No gallop.  Pulmonary:     Effort: Pulmonary effort is normal. No respiratory distress.     Breath sounds: Normal breath sounds. No wheezing, rhonchi or rales.  Abdominal:  General: Bowel sounds are normal. There is no distension.     Palpations: Abdomen is soft.     Tenderness: There is no abdominal tenderness. There is no guarding or rebound.  Musculoskeletal:     Cervical back: Neck supple.     Right lower leg: No edema.     Left lower leg: No edema.  Skin:    General: Skin is warm and dry.     Coloration: Skin is not jaundiced or pale.  Neurological:     General: No focal deficit present.     Mental Status: He is alert and oriented to person, place, and time. Mental status is at baseline.  Psychiatric:        Mood and Affect: Mood normal.        Behavior: Behavior normal.        Thought Content: Thought content normal.        Judgment: Judgment normal.      Assessment:  [name] is a 77 y.o. male  who presents today for Esophagogastroduodenoscopy and Colonoscopy for Positive FIT testing .  Plan:  Esophagogastroduodenoscopy and Colonoscopy with possible intervention today  Esophagogastroduodenoscopy and Colonoscopy with possible biopsy, control of bleeding, polypectomy, and interventions as necessary has been discussed with the patient/patient  representative. Informed consent was obtained from the patient/patient representative after explaining the indication, nature, and risks of the procedure including but not limited to death, bleeding, perforation, missed neoplasm/lesions, cardiorespiratory compromise, and reaction to medications. Opportunity for questions was given and appropriate answers were provided. Patient/patient representative has verbalized understanding is amenable to undergoing the procedure.   Annamaria Helling, DO  [facility]  Portions of the record may have been created with voice recognition software. Occasional wrong-word or 'sound-a-like' substitutions may have occurred due to the inherent limitations of voice recognition software.  Read the chart carefully and recognize, using context, where substitutions may have occurred.

## 2022-10-27 ENCOUNTER — Ambulatory Visit
Admission: RE | Admit: 2022-10-27 | Discharge: 2022-10-27 | Disposition: A | Discharge status: Home / Self Care | Payer: Medicare HMO | Source: Ambulatory Visit | Attending: Gastroenterology | Admitting: Gastroenterology

## 2022-10-27 ENCOUNTER — Ambulatory Visit: Payer: Medicare HMO | Admitting: Anesthesiology

## 2022-10-27 ENCOUNTER — Encounter
Admission: RE | Disposition: A | Discharge status: Home / Self Care | Payer: Self-pay | Source: Ambulatory Visit | Attending: Gastroenterology

## 2022-10-27 ENCOUNTER — Encounter: Payer: Self-pay | Admitting: Gastroenterology

## 2022-10-27 DIAGNOSIS — D128 Benign neoplasm of rectum: Secondary | ICD-10-CM | POA: Diagnosis not present

## 2022-10-27 DIAGNOSIS — K2289 Other specified disease of esophagus: Secondary | ICD-10-CM | POA: Diagnosis not present

## 2022-10-27 DIAGNOSIS — K5730 Diverticulosis of large intestine without perforation or abscess without bleeding: Secondary | ICD-10-CM | POA: Insufficient documentation

## 2022-10-27 DIAGNOSIS — K3189 Other diseases of stomach and duodenum: Secondary | ICD-10-CM | POA: Diagnosis not present

## 2022-10-27 DIAGNOSIS — D124 Benign neoplasm of descending colon: Secondary | ICD-10-CM | POA: Insufficient documentation

## 2022-10-27 DIAGNOSIS — K640 First degree hemorrhoids: Secondary | ICD-10-CM | POA: Insufficient documentation

## 2022-10-27 DIAGNOSIS — K621 Rectal polyp: Secondary | ICD-10-CM | POA: Diagnosis not present

## 2022-10-27 DIAGNOSIS — K259 Gastric ulcer, unspecified as acute or chronic, without hemorrhage or perforation: Secondary | ICD-10-CM | POA: Diagnosis not present

## 2022-10-27 DIAGNOSIS — J449 Chronic obstructive pulmonary disease, unspecified: Secondary | ICD-10-CM | POA: Diagnosis not present

## 2022-10-27 DIAGNOSIS — I252 Old myocardial infarction: Secondary | ICD-10-CM | POA: Insufficient documentation

## 2022-10-27 DIAGNOSIS — K2270 Barrett's esophagus without dysplasia: Secondary | ICD-10-CM | POA: Diagnosis not present

## 2022-10-27 DIAGNOSIS — Z1211 Encounter for screening for malignant neoplasm of colon: Secondary | ICD-10-CM | POA: Diagnosis not present

## 2022-10-27 DIAGNOSIS — D120 Benign neoplasm of cecum: Secondary | ICD-10-CM | POA: Insufficient documentation

## 2022-10-27 DIAGNOSIS — R195 Other fecal abnormalities: Secondary | ICD-10-CM | POA: Diagnosis not present

## 2022-10-27 DIAGNOSIS — Z79899 Other long term (current) drug therapy: Secondary | ICD-10-CM | POA: Insufficient documentation

## 2022-10-27 DIAGNOSIS — Z13810 Encounter for screening for upper gastrointestinal disorder: Secondary | ICD-10-CM | POA: Diagnosis present

## 2022-10-27 DIAGNOSIS — Z902 Acquired absence of lung [part of]: Secondary | ICD-10-CM | POA: Insufficient documentation

## 2022-10-27 DIAGNOSIS — D123 Benign neoplasm of transverse colon: Secondary | ICD-10-CM | POA: Diagnosis not present

## 2022-10-27 DIAGNOSIS — K2950 Unspecified chronic gastritis without bleeding: Secondary | ICD-10-CM | POA: Diagnosis not present

## 2022-10-27 DIAGNOSIS — F17210 Nicotine dependence, cigarettes, uncomplicated: Secondary | ICD-10-CM | POA: Diagnosis not present

## 2022-10-27 DIAGNOSIS — Z09 Encounter for follow-up examination after completed treatment for conditions other than malignant neoplasm: Secondary | ICD-10-CM | POA: Diagnosis not present

## 2022-10-27 DIAGNOSIS — I10 Essential (primary) hypertension: Secondary | ICD-10-CM | POA: Insufficient documentation

## 2022-10-27 DIAGNOSIS — D126 Benign neoplasm of colon, unspecified: Secondary | ICD-10-CM | POA: Diagnosis not present

## 2022-10-27 DIAGNOSIS — K5520 Angiodysplasia of colon without hemorrhage: Secondary | ICD-10-CM | POA: Diagnosis not present

## 2022-10-27 HISTORY — PX: ESOPHAGOGASTRODUODENOSCOPY: SHX5428

## 2022-10-27 HISTORY — PX: COLONOSCOPY: SHX5424

## 2022-10-27 SURGERY — COLONOSCOPY
Anesthesia: General

## 2022-10-27 MED ORDER — PROPOFOL 500 MG/50ML IV EMUL
INTRAVENOUS | Status: DC | PRN
  Administered 2022-10-27: 120 ug/kg/min via INTRAVENOUS

## 2022-10-27 MED ORDER — EPHEDRINE 5 MG/ML INJ
INTRAVENOUS | Status: AC
  Filled 2022-10-27: qty 5

## 2022-10-27 MED ORDER — LIDOCAINE HCL (CARDIAC) PF 100 MG/5ML IV SOSY
PREFILLED_SYRINGE | INTRAVENOUS | Status: DC | PRN
  Administered 2022-10-27: 60 mg via INTRAVENOUS

## 2022-10-27 MED ORDER — PROPOFOL 1000 MG/100ML IV EMUL
INTRAVENOUS | Status: AC
  Filled 2022-10-27: qty 100

## 2022-10-27 MED ORDER — LIDOCAINE HCL (PF) 2 % IJ SOLN
INTRAMUSCULAR | Status: AC
  Filled 2022-10-27: qty 5

## 2022-10-27 MED ORDER — PHENYLEPHRINE 80 MCG/ML (10ML) SYRINGE FOR IV PUSH (FOR BLOOD PRESSURE SUPPORT)
PREFILLED_SYRINGE | INTRAVENOUS | Status: AC
  Filled 2022-10-27: qty 10

## 2022-10-27 MED ORDER — EPHEDRINE SULFATE (PRESSORS) 50 MG/ML IJ SOLN
INTRAMUSCULAR | Status: DC | PRN
  Administered 2022-10-27 (×2): 10 mg via INTRAVENOUS

## 2022-10-27 MED ORDER — SODIUM CHLORIDE 0.9 % IV SOLN: INTRAVENOUS | Status: DC

## 2022-10-27 MED ORDER — PHENYLEPHRINE HCL (PRESSORS) 10 MG/ML IV SOLN
INTRAVENOUS | Status: DC | PRN
  Administered 2022-10-27: 50 ug via INTRAVENOUS
  Administered 2022-10-27 (×3): 100 ug via INTRAVENOUS

## 2022-10-27 NOTE — Anesthesia Preprocedure Evaluation (Signed)
Anesthesia Evaluation  Patient identified by MRN, date of birth, ID band Patient awake    Reviewed: Allergy & Precautions, NPO status , Patient's Chart, lab work & pertinent test results  History of Anesthesia Complications Negative for: history of anesthetic complications  Airway Mallampati: II  TM Distance: >3 FB Neck ROM: Full    Dental  (+) Poor Dentition, Dental Advidsory Given   Pulmonary neg shortness of breath, neg sleep apnea, COPD,  COPD inhaler, Current Smoker and Patient abstained from smoking.   breath sounds clear to auscultation- rhonchi (-) wheezing      Cardiovascular hypertension, Pt. on medications + Past MI  (-) CAD, (-) Cardiac Stents and (-) CABG  Rhythm:Regular Rate:Normal - Systolic murmurs and - Diastolic murmurs    Neuro/Psych neg Seizures PSYCHIATRIC DISORDERS Anxiety     negative neurological ROS     GI/Hepatic negative GI ROS, Neg liver ROS,,,  Endo/Other  negative endocrine ROSneg diabetes    Renal/GU negative Renal ROS     Musculoskeletal negative musculoskeletal ROS (+)    Abdominal  (+) - obese  Peds  Hematology negative hematology ROS (+)   Anesthesia Other Findings Past Medical History: No date: Anxiety No date: Bronchitis 2016: Cancer ([place]) No date: COPD (chronic obstructive pulmonary disease) ([place]) 07/13/2007: Cough     Comment:  Qualifier: Diagnosis of  By: Wynona Luna  No date: Dyspnea     Comment:  due to copd No date: Hypertension     Comment:  pt denies this and is not on bp meds but vascular note               states htn 07/13/2007: LUNG NODULE     Comment:  Qualifier: Diagnosis of  By: Wynona Luna  2013: Pneumonia No date: Thoracic aortic aneurysm ([place])     Comment:  followed by vascular   Reproductive/Obstetrics                             Anesthesia Physical Anesthesia Plan  ASA: 3  Anesthesia Plan: General   Post-op  Pain Management: Minimal or no pain anticipated   Induction: Intravenous  PONV Risk Score and Plan: 0 and Propofol infusion and TIVA  Airway Management Planned: Nasal Cannula and Natural Airway  Additional Equipment: None  Intra-op Plan:   Post-operative Plan: Extubation in OR  Informed Consent: I have reviewed the patients History and Physical, chart, labs and discussed the procedure including the risks, benefits and alternatives for the proposed anesthesia with the patient or authorized representative who has indicated his/her understanding and acceptance.     Dental advisory given  Plan Discussed with: CRNA and Anesthesiologist  Anesthesia Plan Comments: (Discussed risks of anesthesia with patient, including possibility of difficulty with spontaneous ventilation under anesthesia necessitating airway intervention, PONV, and rare risks such as cardiac or respiratory or neurological events, and allergic reactions. Discussed the role of CRNA in patient's perioperative care. Patient understands.)        Anesthesia Quick Evaluation

## 2022-10-27 NOTE — Op Note (Addendum)
[facility]astroenter[facility] Patient Name: Stephen Leon Procedure Date: 10/27/2022 7:35 AM MRN: [id number] Account #: [id number] Date of Birth: Jun 15, 1946 Admit Type: Outpatient Age: 77 Room: [facility] ENDO ROOM 1 Gender: Male Note Status: Finalized Instrument Name: Upper Endoscope [id number] Procedure:             Upper GI endoscopy Indications:           Screening for Barrett's esophagus, Screening for                         Barrett's esophagus in patient at risk for this                         condition Providers:             Annamaria Helling DO, DO Referring MD:          Tracie Harrier, MD (Referring MD) Medicines:             Monitored Anesthesia Care Complications:         No immediate complications. Estimated blood loss:                         Minimal. Procedure:             Pre-Anesthesia Assessment:                        - Prior to the procedure, a History and Physical was                         performed, and patient medications and allergies were                         reviewed. The patient is competent. The risks and                         benefits of the procedure and the sedation options and                         risks were discussed with the patient. All questions                         were answered and informed consent was obtained.                         Patient identification and proposed procedure were                         verified by the physician, the nurse, the anesthetist                         and the technician in the endoscopy suite. Mental                         Status Examination: alert and oriented. Airway                         Examination: normal oropharyngeal airway and neck  mobility. Respiratory Examination: clear to                         auscultation. CV Examination: RRR, no murmurs, no S3                         or S4. Prophylactic Antibiotics: The patient does not                          require prophylactic antibiotics. Prior                         Anticoagulants: The patient has taken no anticoagulant                         or antiplatelet agents. ASA Grade Assessment: III - A                         patient with severe systemic disease. After reviewing                         the risks and benefits, the patient was deemed in                         satisfactory condition to undergo the procedure. The                         anesthesia plan was to use monitored anesthesia care                         (MAC). Immediately prior to administration of                         medications, the patient was re-assessed for adequacy                         to receive sedatives. The heart rate, respiratory                         rate, oxygen saturations, blood pressure, adequacy of                         pulmonary ventilation, and response to care were                         monitored throughout the procedure. The physical                         status of the patient was re-assessed after the                         procedure.                        After obtaining informed consent, the endoscope was                         passed under direct vision. Throughout the procedure,  the patient's blood pressure, pulse, and oxygen                         saturations were monitored continuously. The Endoscope                         was introduced through the mouth, and advanced to the                         second part of duodenum. The upper GI endoscopy was                         accomplished without difficulty. The patient tolerated                         the procedure well. Findings:      The duodenal bulb, first portion of the duodenum and second portion of       the duodenum were normal. Estimated blood loss: none.      Two non-bleeding cratered gastric ulcers with a clean ulcer base       (Forrest Class III) were found in the gastric  antrum. The largest lesion       was 6 mm in largest dimension. Biopsies were taken with a cold forceps       for histology. Estimated blood loss was minimal.      Localized moderate inflammation characterized by erythema was found in       the gastric antrum. Biopsies were taken with a cold forceps for       Helicobacter pylori testing. Estimated blood loss was minimal.      The exam of the stomach was otherwise normal.      The Z-line was irregular.      There were esophageal mucosal changes classified as Barrett's stage       C2-M4 per Prague criteria present in the lower third of the esophagus.       The maximum longitudinal extent of these mucosal changes was 4 cm in       length. Mucosa was biopsied with a cold forceps for histology. A total       of 2 specimen bottles were sent to pathology. taken in 4 quadrant       fashion. Estimated blood loss was minimal.      Esophagogastric landmarks were identified: the gastroesophageal junction       was found at 42 cm from the incisors.      Frequent coughing during procedure made it difficult to accurately       measure total length. Estimated blood loss was minimal.      The exam of the esophagus was otherwise normal. Impression:            - Normal duodenal bulb, first portion of the duodenum                         and second portion of the duodenum.                        - Non-bleeding gastric ulcers with a clean ulcer base                         (Forrest Class III).  Biopsied.                        - Gastritis. Biopsied.                        - Z-line irregular.                        - Esophageal mucosal changes classified as Barrett's                         stage C2-M4 per Prague criteria. Biopsied.                        - Esophagogastric landmarks identified. Recommendation:        - Patient has a contact number available for                         emergencies. The signs and symptoms of potential                          delayed complications were discussed with the patient.                         Return to normal activities tomorrow. Written                         discharge instructions were provided to the patient.                        - Discharge patient to home.                        - Resume previous diet.                        - Continue present medications.                        - No aspirin, ibuprofen, naproxen, or other                         non-steroidal anti-inflammatory drugs for 5 days after                         biopsy.                        - Await pathology results.                        - Repeat upper endoscopy for surveillance based on                         pathology results.                        - Return to GI office as previously scheduled.                        - The findings and recommendations were discussed with  the patient.                        - proceed with colonoscopy                        - Use Prilosec (omeprazole) 40 mg PO BID for 8 weeks. Procedure Code(s):     --- Professional ---                        [id number], Esophagogastroduodenoscopy, flexible,                         transoral; with biopsy, single or multiple Diagnosis Code(s):     --- Professional ---                        K22.70, Barrett's esophagus without dysplasia                        K25.9, Gastric ulcer, unspecified as acute or chronic,                         without hemorrhage or perforation                        K29.70, Gastritis, unspecified, without bleeding                        K22.89, Other specified disease of esophagus                        Z13.810, Encounter for screening for upper                         gastrointestinal disorder CPT copyright 2022 American Medical Association. All rights reserved. The codes documented in this report are preliminary and upon coder review may  be revised to meet current compliance requirements. Attending  Participation:      I personally performed the entire procedure. Volney American, DO Annamaria Helling DO, DO 10/27/2022 7:53:52 AM This report has been signed electronically. Number of Addenda: 0 Note Initiated On: 10/27/2022 7:35 AM Estimated Blood Loss:  Estimated blood loss was minimal.      [facility]

## 2022-10-27 NOTE — Anesthesia Postprocedure Evaluation (Signed)
Anesthesia Post Note  Patient: Marlik Hasting  Procedure(s) Performed: COLONOSCOPY ESOPHAGOGASTRODUODENOSCOPY (EGD)  Patient location during evaluation: Endoscopy Anesthesia Type: General Level of consciousness: awake and alert Pain management: pain level controlled Vital Signs Assessment: post-procedure vital signs reviewed and stable Respiratory status: spontaneous breathing, nonlabored ventilation, respiratory function stable and patient connected to nasal cannula oxygen Cardiovascular status: blood pressure returned to baseline and stable Postop Assessment: no apparent nausea or vomiting Anesthetic complications: no  There were no known notable events for this encounter.   Last Vitals:  Vitals:   10/27/22 0846 10/27/22 0856  BP: (!) 132/96 129/81  Pulse: 77 73  Resp: (!) 21 20  Temp:    SpO2: 92% 90%    Last Pain:  Vitals:   10/27/22 0856  TempSrc:   PainSc: 0-No pain                 Dimas Millin

## 2022-10-27 NOTE — Op Note (Signed)
[facility] Patient Name: Stephen Leon Procedure Date: 10/27/2022 7:34 AM MRN: [id number] Account #: [id number] Date of Birth: 07/20/1946 Admit Type: Outpatient Age: 77 Room: [facility] ENDO ROOM 1 Gender: Male Note Status: Finalized Instrument Name: Peds Colonoscope [id number] Procedure:             Colonoscopy Indications:           Positive fecal immunochemical test Providers:             Annamaria Helling DO, DO Referring MD:          Tracie Harrier, MD (Referring MD) Medicines:             Monitored Anesthesia Care Complications:         No immediate complications. Estimated blood loss:                         Minimal. Procedure:             Pre-Anesthesia Assessment:                        - Prior to the procedure, a History and Physical was                         performed, and patient medications and allergies were                         reviewed. The patient is competent. The risks and                         benefits of the procedure and the sedation options and                         risks were discussed with the patient. All questions                         were answered and informed consent was obtained.                         Patient identification and proposed procedure were                         verified by the physician, the nurse, the anesthetist                         and the technician in the endoscopy suite. Mental                         Status Examination: alert and oriented. Airway                         Examination: normal oropharyngeal airway and neck                         mobility. Respiratory Examination: clear to                         auscultation. CV Examination: RRR, no murmurs, no S3  or S4. Prophylactic Antibiotics: The patient does not                         require prophylactic antibiotics. Prior                         Anticoagulants: The patient has taken no  anticoagulant                         or antiplatelet agents. ASA Grade Assessment: III - A                         patient with severe systemic disease. After reviewing                         the risks and benefits, the patient was deemed in                         satisfactory condition to undergo the procedure. The                         anesthesia plan was to use monitored anesthesia care                         (MAC). Immediately prior to administration of                         medications, the patient was re-assessed for adequacy                         to receive sedatives. The heart rate, respiratory                         rate, oxygen saturations, blood pressure, adequacy of                         pulmonary ventilation, and response to care were                         monitored throughout the procedure. The physical                         status of the patient was re-assessed after the                         procedure.                        After obtaining informed consent, the colonoscope was                         passed under direct vision. Throughout the procedure,                         the patient's blood pressure, pulse, and oxygen                         saturations were monitored continuously. The  Colonoscope was introduced through the anus and                         advanced to the the cecum, identified by appendiceal                         orifice and ileocecal valve. The colonoscopy was                         somewhat difficult due to unsatisfactory bowel prep,                         spasm. Successful completion of the procedure was                         aided by managing the patient's medical instability.                         The patient tolerated the procedure well. The quality                         of the bowel preparation was evaluated using the BBPS                         [facility] Bowel Preparation Scale) with scores  of: Right                         Colon = 2 (minor amount of residual staining, small                         fragments of stool and/or opaque liquid, but mucosa                         seen well), Transverse Colon = 2 (minor amount of                         residual staining, small fragments of stool and/or                         opaque liquid, but mucosa seen well) and Left Colon =                         1 (portion of mucosa seen, but other areas not well                         seen due to staining, residual stool and/or opaque                         liquid). The total BBPS score equals 5. The quality of                         the bowel preparation was inadequate. The ileocecal                         valve, appendiceal orifice, and rectum were  photographed. Findings:      The perianal and digital rectal examinations were normal. Pertinent       negatives include normal sphincter tone.      Non-bleeding internal hemorrhoids were found during retroflexion. The       hemorrhoids were Grade I (internal hemorrhoids that do not prolapse).       Estimated blood loss: none.      Multiple small-mouthed diverticula were found in the left colon.       Estimated blood loss: none.      A 12 to 14 mm polyp was found in the descending colon. The polyp was       sessile. The polyp was removed with a hot snare. Resection and retrieval       were complete. Estimated blood loss was minimal.      Three sessile polyps were found in the rectum (2) and cecum. The polyps       were 1 to 2 mm in size. These polyps were removed with a jumbo cold       forceps. Resection and retrieval were complete. Estimated blood loss was       minimal. To prevent bleeding after the polypectomy, one hemostatic clip       was successfully placed (MR conditional). There was no bleeding at the       end of the procedure. Clip placed on cecal polypectomy site      Four sessile polyps were found in  the rectum (2) and transverse colon       (2). The polyps were 4 to 8 mm in size. These polyps were removed with a       cold snare. Resection and retrieval were complete. Estimated blood loss       was minimal.      The exam was otherwise without abnormality on direct and retroflexion       views. Impression:            - Preparation of the colon was inadequate.                        - Non-bleeding internal hemorrhoids.                        - Diverticulosis in the left colon.                        - One 12 to 14 mm polyp in the descending colon,                         removed with a hot snare. Resected and retrieved.                        - Three 1 to 2 mm polyps in the rectum and in the                         cecum, removed with a jumbo cold forceps. Resected and                         retrieved. Clip (MR conditional) was placed.                        - Four 4 to 8 mm  polyps in the rectum and in the                         transverse colon, removed with a cold snare. Resected                         and retrieved.                        - The examination was otherwise normal on direct and                         retroflexion views. Recommendation:        - Patient has a contact number available for                         emergencies. The signs and symptoms of potential                         delayed complications were discussed with the patient.                         Return to normal activities tomorrow. Written                         discharge instructions were provided to the patient.                        - Discharge patient to home.                        - Resume previous diet.                        - Continue present medications.                        - No aspirin, ibuprofen, naproxen, or other                         non-steroidal anti-inflammatory drugs for 5 days after                         polyp removal.                        - Await pathology results.                         - Repeat colonoscopy in 3 months because the bowel                         preparation was poor.                        - Return to GI office as previously scheduled.                        - The findings and recommendations were discussed with  the patient. Procedure Code(s):     --- Professional ---                        [phone], Colonoscopy, flexible; with removal of                         tumor(s), polyp(s), or other lesion(s) by snare                         technique                        45380, 14, Colonoscopy, flexible; with biopsy, single                         or multiple Diagnosis Code(s):     --- Professional ---                        K64.0, First degree hemorrhoids                        D12.4, Benign neoplasm of descending colon                        D12.0, Benign neoplasm of cecum                        D12.8, Benign neoplasm of rectum                        D12.3, Benign neoplasm of transverse colon (hepatic                         flexure or splenic flexure)                        R19.5, Other fecal abnormalities                        K57.30, Diverticulosis of large intestine without                         perforation or abscess without bleeding CPT copyright 2022 American Medical Association. All rights reserved. The codes documented in this report are preliminary and upon coder review may  be revised to meet current compliance requirements. Attending Participation:      I personally performed the entire procedure. Volney American, DO Annamaria Helling DO, DO 10/27/2022 8:45:05 AM This report has been signed electronically. Number of Addenda: 0 Note Initiated On: 10/27/2022 7:34 AM Scope Withdrawal Time: 0 hours 17 minutes 55 seconds  Total Procedure Duration: 0 hours 33 minutes 36 seconds  Estimated Blood Loss:  Estimated blood loss was minimal.      [facility]

## 2022-10-27 NOTE — Transfer of Care (Signed)
Immediate Anesthesia Transfer of Care Note  Patient: Stephen Leon  Procedure(s) Performed: COLONOSCOPY ESOPHAGOGASTRODUODENOSCOPY (EGD)  Patient Location: PACU  Anesthesia Type:General  Level of Consciousness: awake and sedated  Airway & Oxygen Therapy: Patient Spontanous Breathing and Patient connected to nasal cannula oxygen  Post-op Assessment: Report given to RN and Post -op Vital signs reviewed and stable  Post vital signs: Reviewed and stable  Last Vitals:  Vitals Value Taken Time  BP 107/68 10/27/22 0836  Temp 36 C 10/27/22 0836  Pulse 81 10/27/22 0836  Resp 21 10/27/22 0836  SpO2 92 % 10/27/22 0836    Last Pain:  Vitals:   10/27/22 0836  TempSrc: Temporal  PainSc: 0-No pain         Complications: There were no known notable events for this encounter.

## 2022-10-27 NOTE — Interval H&P Note (Signed)
History and Physical Interval Note: Preprocedure H&P from 10/27/22  was reviewed and there was no interval change after seeing and examining the patient.  Written consent was obtained from the patient after discussion of risks, benefits, and alternatives. Patient has consented to proceed with Esophagogastroduodenoscopy and Colonoscopy with possible intervention   10/27/2022 7:32 AM  Stephen Leon  has presented today for surgery, with the diagnosis of Positive FIT (fecal immunochemical test) (R19.5).  The various methods of treatment have been discussed with the patient and family. After consideration of risks, benefits and other options for treatment, the patient has consented to  Procedure(s): COLONOSCOPY (N/A) ESOPHAGOGASTRODUODENOSCOPY (EGD) (N/A) as a surgical intervention.  The patient's history has been reviewed, patient examined, no change in status, stable for surgery.  I have reviewed the patient's chart and labs.  Questions were answered to the patient's satisfaction.     Annamaria Helling

## 2022-10-28 ENCOUNTER — Encounter: Payer: Self-pay | Admitting: Gastroenterology

## 2022-10-28 LAB — SURGICAL PATHOLOGY

## 2022-11-08 ENCOUNTER — Other Ambulatory Visit: Payer: Self-pay | Admitting: *Deleted

## 2022-11-08 DIAGNOSIS — Z87891 Personal history of nicotine dependence: Secondary | ICD-10-CM

## 2022-11-08 DIAGNOSIS — Z122 Encounter for screening for malignant neoplasm of respiratory organs: Secondary | ICD-10-CM

## 2022-11-08 DIAGNOSIS — F17210 Nicotine dependence, cigarettes, uncomplicated: Secondary | ICD-10-CM

## 2022-11-24 ENCOUNTER — Encounter: Payer: Self-pay | Admitting: Physician Assistant

## 2022-11-24 ENCOUNTER — Ambulatory Visit (INDEPENDENT_AMBULATORY_CARE_PROVIDER_SITE_OTHER): Payer: Medicare HMO | Admitting: Physician Assistant

## 2022-11-24 DIAGNOSIS — F17210 Nicotine dependence, cigarettes, uncomplicated: Secondary | ICD-10-CM | POA: Diagnosis not present

## 2022-11-24 NOTE — Patient Instructions (Signed)
Thank you for participating in the [place] [facility] Lung Cancer Screening Program. It was our pleasure to meet you today. We will call you with the results of your scan within the next few days. Your scan will be assigned a Lung RADS category score by the physicians reading the scans.  This Lung RADS score determines follow up scanning.  See below for description of categories, and follow up screening recommendations. We will be in touch to schedule your follow up screening annually or based on recommendations of our providers. We will fax a copy of your scan results to your Primary Care Physician, or the physician who referred you to the program, to ensure they have the results. Please call the office if you have any questions or concerns regarding your scanning experience or results.  Our office number is [phone]. Please speak with Denise Phelps, RN. , or  Denise Buckner RN, They are  our Lung Cancer Screening RN.'s If They are unavailable when you call, Please leave a message on the voice mail. We will return your call at our earliest convenience.This voice mail is monitored several times a day.  Remember, if your scan is normal, we will scan you annually as long as you continue to meet the criteria for the program. (Age 50-80, Current smoker or smoker who has quit within the last 15 years). If you are a smoker, remember, quitting is the single most powerful action that you can take to decrease your risk of lung cancer and other pulmonary, breathing related problems. We know quitting is hard, and we are here to help.  Please let us know if there is anything we can do to help you meet your goal of quitting. If you are a former smoker, congratulations. We are proud of you! Remain smoke free! Remember you can refer friends or family members through the number above.  We will screen them to make sure they meet criteria for the program. Thank you for helping us take better care of you by  participating in Lung Screening.  You can receive free nicotine replacement therapy ( patches, gum or mints) by calling 1-800-QUIT NOW. Please call so we can get you on the path to becoming  a non-smoker. I know it is hard, but you can do this!  Lung RADS Categories:  Lung RADS 1: no nodules or definitely non-concerning nodules.  Recommendation is for a repeat annual scan in 12 months.  Lung RADS 2:  nodules that are non-concerning in appearance and behavior with a very low likelihood of becoming an active cancer. Recommendation is for a repeat annual scan in 12 months.  Lung RADS 3: nodules that are probably non-concerning , includes nodules with a low likelihood of becoming an active cancer.  Recommendation is for a 6-month repeat screening scan. Often noted after an upper respiratory illness. We will be in touch to make sure you have no questions, and to schedule your 6-month scan.  Lung RADS 4 A: nodules with concerning findings, recommendation is most often for a follow up scan in 3 months or additional testing based on our provider's assessment of the scan. We will be in touch to make sure you have no questions and to schedule the recommended 3 month follow up scan.  Lung RADS 4 B:  indicates findings that are concerning. We will be in touch with you to schedule additional diagnostic testing based on our provider's  assessment of the scan.  Other options for assistance in smoking cessation (   As covered by your insurance benefits)  Hypnosis for smoking cessation  [employer] [phone]  Acupuncture for smoking cessation  [facility] [phone]   

## 2022-11-24 NOTE — Progress Notes (Signed)
Virtual Visit via Telephone Note  I connected with Stephen Leon on 11/24/22 at  9:00 AM EDT by telephone and verified that I am speaking with the correct person using two identifiers.  Location: Patient: home Provider: working virtually from home   I discussed the limitations, risks, security and privacy concerns of performing an evaluation and management service by telephone and the availability of in person appointments. I also discussed with the patient that there may be a patient responsible charge related to this service. The patient expressed understanding and agreed to proceed.      Shared Decision Making Visit Lung Cancer Screening Program [phone])   Eligibility: Age 77 y.o. Pack Years Smoking History Calculation 23 (# packs/per year x # years smoked) Recent History of coughing up blood  no Unexplained weight loss? no ( >Than 15 pounds within the last 6 months ) Prior History Lung / other cancer no (Diagnosis within the last 5 years already requiring surveillance chest CT Scans). Smoking Status Current Smoker Former Smokers: Years since quit: n/a  Quit Date: n/a  Visit Components: Discussion included one or more decision making aids. yes Discussion included risk/benefits of screening. yes Discussion included potential follow up diagnostic testing for abnormal scans. yes Discussion included meaning and risk of over diagnosis. yes Discussion included meaning and risk of False Positives. yes Discussion included meaning of total radiation exposure. yes  Counseling Included: Importance of adherence to annual lung cancer LDCT screening. yes Impact of comorbidities on ability to participate in the program. yes Ability and willingness to under diagnostic treatment. yes  Smoking Cessation Counseling: Current Smokers:  Discussed importance of smoking cessation. yes Information about tobacco cessation classes and interventions provided to patient. yes Patient  provided with "ticket" for LDCT Scan. N/a Symptomatic Patient. no Diagnosis Code: Tobacco Use Z72.0 Asymptomatic Patient yes  Counseling (Intermediate counseling: > three minutes counseling) U0454 Diagnosis Code: Personal History of Nicotine Dependence. U98.119 Information about tobacco cessation classes and interventions provided to patient. Yes Written Order for Lung Cancer Screening with LDCT placed in Epic. Yes (CT Chest Lung Cancer Screening Low Dose W/O CM) [id number] Z12.2-Screening of respiratory organs Z87.891-Personal history of nicotine dependence    I have spent 25 minutes of face to face/ virtual visit   time with the patient discussing the risks and benefits of lung cancer screening. We viewed / discussed a power point together that explained in detail the above noted topics. We paused at intervals to allow for questions to be asked and answered to ensure understanding.We discussed that the single most powerful action that he can take to decrease his risk of developing lung cancer is to quit smoking. We discussed whether or not he is ready to commit to setting a quit date. We discussed options for tools to aid in quitting smoking including nicotine replacement therapy, non-nicotine medications, support groups, Quit Smart classes, and behavior modification. We discussed that often times setting smaller, more achievable goals, such as eliminating 1 cigarette a day for a week and then 2 cigarettes a day for a week can be helpful in slowly decreasing the number of cigarettes smoked. This allows for a sense of accomplishment as well as providing a clinical benefit. I provided  him  with smoking cessation  information  with contact information for community resources, classes, free nicotine replacement therapy, and access to mobile apps, text messaging, and on-line smoking cessation help. I have also provided  him  the office contact information in the event  he needs to contact me, or the  screening staff. We discussed the time and location of the scan, and that either Abigail Miyamoto RN, Karlton Lemon, RN  or I will call / send a letter with the results within 24-72 hours of receiving them. The patient verbalized understanding of all of  the above and had no further questions upon leaving the office. They have my contact information in the event they have any further questions.  I spent three minutes counseling on smoking cessation and the health risks of continued tobacco abuse.  I explained to the patient that there has been a high incidence of coronary artery disease noted on these exams. I explained that this is a non-gated exam therefore degree or severity cannot be determined. This patient is on statin therapy. I have asked the patient to follow-up with their PCP regarding any incidental finding of coronary artery disease and management with diet or medication as their PCP  feels is clinically indicated. The patient verbalized understanding of the above and had no further questions upon completion of the visit.      Darcella Gasman Alexes Menchaca, PA-C

## 2022-11-25 ENCOUNTER — Ambulatory Visit
Admission: RE | Admit: 2022-11-25 | Discharge: 2022-11-25 | Disposition: A | Discharge status: Home / Self Care | Payer: Medicare HMO | Source: Ambulatory Visit | Attending: Acute Care | Admitting: Acute Care

## 2022-11-25 DIAGNOSIS — F17210 Nicotine dependence, cigarettes, uncomplicated: Secondary | ICD-10-CM | POA: Diagnosis not present

## 2022-11-25 DIAGNOSIS — Z87891 Personal history of nicotine dependence: Secondary | ICD-10-CM | POA: Insufficient documentation

## 2022-11-25 DIAGNOSIS — Z122 Encounter for screening for malignant neoplasm of respiratory organs: Secondary | ICD-10-CM | POA: Insufficient documentation

## 2022-11-29 ENCOUNTER — Telehealth: Payer: Self-pay | Admitting: Acute Care

## 2022-11-29 DIAGNOSIS — R079 Chest pain, unspecified: Secondary | ICD-10-CM | POA: Diagnosis not present

## 2022-11-29 DIAGNOSIS — I2510 Atherosclerotic heart disease of native coronary artery without angina pectoris: Secondary | ICD-10-CM | POA: Diagnosis not present

## 2022-11-29 DIAGNOSIS — I25118 Atherosclerotic heart disease of native coronary artery with other forms of angina pectoris: Secondary | ICD-10-CM | POA: Diagnosis not present

## 2022-11-29 DIAGNOSIS — G8912 Acute post-thoracotomy pain: Secondary | ICD-10-CM | POA: Diagnosis not present

## 2022-11-29 DIAGNOSIS — I10 Essential (primary) hypertension: Secondary | ICD-10-CM | POA: Diagnosis not present

## 2022-11-29 DIAGNOSIS — Z720 Tobacco use: Secondary | ICD-10-CM | POA: Diagnosis not present

## 2022-11-29 NOTE — Telephone Encounter (Signed)
Spoke with pt . He is scheduled to see Dr Aundria Rud on 12/01/22 10:30.

## 2022-11-29 NOTE — Telephone Encounter (Signed)
Tiffany calling with call report. For CT scan.Tiffany phone number is [phone]. 

## 2022-11-29 NOTE — Telephone Encounter (Signed)
Called and spoke with French Ana at [facility]. Confirmed that we are able to see patient's lung cancer screening CT in his chart. Below is a copy of the impression:   IMPRESSION: 1. Lung-RADS 4B, suspicious. Additional imaging evaluation or consultation with Pulmonology or Thoracic Surgery recommended. Widespread patchy nodular foci of consolidation throughout the basilar right lower lobe, new from 04/23/2021 chest CT, measuring up to 26.4 mm in volume derived mean diameter, potentially bronchopneumonia. Follow up low-dose chest CT without contrast in 3 months (please use the following order, "CT CHEST LCS NODULE FOLLOW-UP W/O CM") is recommended. 2. Chronic calcified bilateral pleural plaques, compatible with asbestos related pleural disease. Trace right pleural effusion. 3. Right upper lobectomy. Complete right middle lobe atelectasis with occluded proximal right middle lobe bronchus. 4. Two-vessel coronary atherosclerosis. 5. Diffuse osteopenia. 6. Aortic Atherosclerosis (ICD10-I70.0) and Emphysema (ICD10-J43.9).  Will route to Maralyn Sago and lung cancer screening pool.

## 2022-12-01 ENCOUNTER — Ambulatory Visit (INDEPENDENT_AMBULATORY_CARE_PROVIDER_SITE_OTHER): Payer: Medicare HMO | Admitting: Student in an Organized Health Care Education/Training Program

## 2022-12-01 ENCOUNTER — Encounter: Payer: Self-pay | Admitting: Student in an Organized Health Care Education/Training Program

## 2022-12-01 VITALS — BP 118/60 | HR 75 | Temp 98.0°F | Ht 68.0 in | Wt 136.4 lb

## 2022-12-01 DIAGNOSIS — Z122 Encounter for screening for malignant neoplasm of respiratory organs: Secondary | ICD-10-CM | POA: Diagnosis not present

## 2022-12-01 DIAGNOSIS — L02412 Cutaneous abscess of left axilla: Secondary | ICD-10-CM | POA: Diagnosis not present

## 2022-12-01 DIAGNOSIS — R911 Solitary pulmonary nodule: Secondary | ICD-10-CM

## 2022-12-01 MED ORDER — AMOXICILLIN-POT CLAVULANATE 875-125 MG PO TABS: 1.0000 | ORAL_TABLET | Freq: Two times a day (BID) | ORAL | 0 refills | Status: AC

## 2022-12-01 MED ORDER — SODIUM CHLORIDE 3 % IN NEBU: INHALATION_SOLUTION | Freq: Two times a day (BID) | RESPIRATORY_TRACT | 12 refills | Status: AC

## 2022-12-01 NOTE — Progress Notes (Signed)
Assessment & Plan:   1. Screening for malignant neoplasm of respiratory organ 2. Lung nodule 3. Tree-in-bud opacities 4. RML collapse 5. Bronchiectasis  [name] is presenting for the evaluation of areas of patchy consolidation noted in the RLL on his chest CT, with associated tree-in-bud opacities in the RLL and partial collapse of the RLL. Patient is also noted to have bronchiectasis on the CT.  The findings on the CT most likely represent an infectious etiology, with the differential including pneumonia, a viral infection, and an exacerbation of bronchiectasis. Malignancy is low on my differential, thou he might require flexible bronchoscopy to rule this out should the findings on the CT not resolve.  For management, I will prescribe [name] another course of antibiotics, and supplement it with a pulmonary toilet regimen to include hypertonic saline, nebulized albuterol, and an acapella device. He will use this twice daily and I will see him in short term follow up to assess his symptom burden. I will obtain a repeat chest CT in 2 months to re-evaluate the findings on the CT. Should they not resolve, we will need to proceed with flexible bronchoscopy for airway survey and bronchoalveolar lavage for cell count and culture. This will also allow [place] to evaluate the RML for any endobronchial lesions.  - amoxicillin-clavulanate (AUGMENTIN) 875-125 MG tablet; Take 1 tablet by mouth 2 (two) times daily for 7 days.  Dispense: 14 tablet; Refill: 0 - CT CHEST LCS NODULE F/U LOW DOSE WO CONTRAST; Future   Return in about 3 months (around 03/02/2023).  I spent 60 minutes caring for this patient today, including preparing to see the patient, obtaining a medical history , reviewing a separately obtained history, performing a medically appropriate examination and/or evaluation, counseling and educating the patient/family/caregiver, ordering medications, tests, or procedures, documenting clinical  information in the electronic health record, and independently interpreting results (not separately reported/billed) and communicating results to the patient/family/caregiver  Raechel Chute, MD [facility] 12/01/2022 4:38 PM    End of visit medications:  Meds ordered this encounter  Medications   amoxicillin-clavulanate (AUGMENTIN) 875-125 MG tablet    Sig: Take 1 tablet by mouth 2 (two) times daily for 7 days.    Dispense:  14 tablet    Refill:  0     Current Outpatient Medications:    amoxicillin-clavulanate (AUGMENTIN) 875-125 MG tablet, Take 1 tablet by mouth 2 (two) times daily for 7 days., Disp: 14 tablet, Rfl: 0   chlorpheniramine-HYDROcodone (TUSSIONEX) 10-8 MG/5ML, Take 5 mLs by mouth at bedtime as needed., Disp: , Rfl:    gabapentin (NEURONTIN) 800 MG tablet, Take 800 mg by mouth 2 (two) times daily. , Disp: , Rfl:    ibuprofen (ADVIL) 800 MG tablet, Take 800 mg by mouth every 8 (eight) hours as needed for mild pain., Disp: , Rfl:    tamsulosin (FLOMAX) 0.4 MG CAPS capsule, Take 0.4 mg by mouth at bedtime., Disp: , Rfl:    TRELEGY ELLIPTA 100-62.5-25 MCG/INH AEPB, Inhale 1 puff into the lungs daily., Disp: , Rfl:    aspirin EC 81 MG tablet, Take 81 mg by mouth daily. Swallow whole. (Patient not taking: Reported on 12/01/2022), Disp: , Rfl:    atorvastatin (LIPITOR) 80 MG tablet, Take 80 mg by mouth daily. (Patient not taking: Reported on 12/01/2022), Disp: , Rfl:    clopidogrel (PLAVIX) 75 MG tablet, Take 1 tablet (75 mg total) by mouth daily. (Patient not taking: Reported on 12/01/2022), Disp: 30  tablet, Rfl: 0   ipratropium-albuterol (DUONEB) 0.5-2.5 (3) MG/3ML SOLN, Inhale 3 mLs into the lungs every 4 (four) hours as needed (When he get sick and the doctor tells him to take it). (Patient not taking: Reported on 12/01/2022), Disp: , Rfl:    isosorbide mononitrate (IMDUR) 30 MG 24 hr tablet, Take 1 tablet (30 mg total) by mouth daily. (Patient not taking:  Reported on 12/01/2022), Disp: 30 tablet, Rfl: 0   metoprolol succinate (TOPROL-XL) 25 MG 24 hr tablet, Take 1 tablet (25 mg total) by mouth daily. (Patient not taking: Reported on 12/01/2022), Disp: 30 tablet, Rfl: 0   nicotine polacrilex (NICORETTE) 2 MG gum, Take 1 each (2 mg total) by mouth as needed for smoking cessation. (Patient not taking: Reported on 12/01/2022), Disp: 100 tablet, Rfl: 0   Subjective:   PATIENT ID: Stephen Leon GENDER: male DOB: 09/25/1945, MRN: [id number]  Chief Complaint  Patient presents with   pulmonary consult    CT 11/28/2022-SOB with exertion, prod with green to sputum and wheezing.     HPI  [name] is a pleasant 77 year old male presenting to clinic for the evaluation of pulmonary nodules noted on chest CT.  [name] was enrolled in our lung cancer screening program and had his first CT on 11/28/2022. This initial screening CT was notable for an area of patchy nodular foci of consolidation in the right lower lobe. On my review, this is also associated with some tree-in-bud nodularity in the RLL. Furthermore, there are bilateral calcified pleural plaques noted on the chest CT. In addition to that, there is atelectasis in the RML.  Patient reports that he has a history of COPD and is managed with Trelegy for it. He is has around 2 to 3 COPD exacerbations a year, and receives antibiotics and steroids for them. His last episode was earlier in April, where he was prescribed a course of prednisone (tapered over 9 days). He was having increased cough with increased sputum production, with no fever or chills. This was typical of his COPD exacerbations. Today, he reports exertional dyspnea that is chronic, as well as a cough. Cough is productive of white and yellowish sputum, and is improved compared to prior.  [name] is originally from [place], and served in the [employer] for many years. 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 He is a current smoker, and has decreased his smoking to 5 cigarettes a day. He previously lived for a few years in the [place] of [place] working on [employer] there. He moved to the [place] in the late 80's.  Other past medical history includes a diagnosis of CAD, medically managed on DAPT, and followed by Dr. Darrold Junker. 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 Patient was previously seen by Dr. Meredeth Ide in 2021 for COPD   Ancillary information including prior medications, full medical/surgical/family/social histories, and PFTs (when available) are listed below and have been reviewed.   Review of Systems  Constitutional:  Negative for chills, fever and weight loss.  Respiratory:  Positive for cough, sputum production and shortness of breath.   Cardiovascular:  Negative for chest pain.     Objective:   Vitals:   12/01/22 1029  BP: 118/60  Pulse: 75  Temp: 98 F (36.7 C)  TempSrc: Temporal  SpO2: 94%  Weight: 136 lb 6.4 oz (61.9 kg)  Height: 5\' 8"  (1.727 m)  94% on RA  BMI Readings from Last 3 Encounters:  12/01/22 20.74 kg/m  10/27/22 21.46 kg/m  05/11/22 22.05 kg/m   Wt Readings from Last 3 Encounters:  12/01/22 136 lb 6.4 oz (61.9 kg)  10/27/22 137 lb (62.1 kg)  05/11/22 145 lb (65.8 kg)    Physical Exam Constitutional:      Appearance: Normal appearance.  Cardiovascular:     Rate and Rhythm: Normal rate and regular rhythm.     Pulses: Normal pulses.     Heart sounds: No murmur heard. Pulmonary:     Effort: Pulmonary effort is normal.     Breath sounds: Rales present. No wheezing.  Abdominal:     Palpations: Abdomen is soft.  Neurological:     General: No focal deficit present.     Mental Status: He is alert and oriented to person, place, and time. Mental status is at baseline.       Ancillary Information    Past Medical History:  Diagnosis Date   Anxiety     Bronchitis    Cancer 2016   COPD (chronic obstructive pulmonary disease)    Cough 07/13/2007   Qualifier: Diagnosis of  By: Nena Jordan    Dyspnea    due to copd   Hypertension    pt denies this and is not on bp meds but vascular note states htn   LUNG NODULE 07/13/2007   Qualifier: Diagnosis of  By: Nena Jordan    Pneumonia 2013   Thoracic aortic aneurysm    followed by vascular     Family History  Problem Relation Age of Onset   COPD Mother      Past Surgical History:  Procedure Laterality Date   COLONOSCOPY N/A 10/27/2022   Procedure: COLONOSCOPY;  Surgeon: Jaynie Collins, DO;  Location[facility] ENDOSCOPY;  Service: Gastroenterology;  Laterality: N/A;   CORONARY PRESSURE/FFR STUDY N/A 05/19/2020   Procedure: INTRAVASCULAR PRESSURE WIRE/FFR STUDY;  Surgeon: Marcina Millard, MD;  Location: [facility] INVASIVE CV LAB;  Service: Cardiovascular;  Laterality: N/A;   DIAGNOSTIC LAPAROSCOPY     ESOPHAGOGASTRODUODENOSCOPY N/A 10/27/2022   Procedure: ESOPHAGOGASTRODUODENOSCOPY (EGD);  Surgeon: Jaynie Collins, DO;  Location[facility] ENDOSCOPY;  Service: Gastroenterology;  Laterality: N/A;   EYE SURGERY Bilateral    HERNIA REPAIR     KYPHOPLASTY N/A 12/13/2018   Procedure: KYPHOPLASTY- T7;  Surgeon: Kennedy Bucker, MD;  Location: [facility] ORS;  Service: Orthopedics;  Laterality: N/A;   LEFT HEART CATH N/A 05/11/2022   Procedure: Left Heart Cath;  Surgeon: Marcina Millard, MD;  Location: [facility] INVASIVE CV LAB;  Service: Cardiovascular;  Laterality: N/A;   LEFT HEART CATH AND CORONARY ANGIOGRAPHY Left 05/19/2020   Procedure: LEFT HEART CATH AND CORONARY ANGIOGRAPHY;  Surgeon: Marcina Millard, MD;  Location: [facility] INVASIVE CV LAB;  Service: Cardiovascular;  Laterality: Left;   LUNG LOBECTOMY Right    upper lung due to asbestos exposure   XI ROBOTIC ASSISTED INGUINAL HERNIA REPAIR WITH MESH Right 07/19/2019   Procedure: XI ROBOTIC ASSISTED INGUINAL HERNIA REPAIR WITH  MESH;  Surgeon: Sung Amabile, DO;  Location: [facility] ORS;  Service: General;  Laterality: Right;    Social History   Socioeconomic History   Marital status: Married    Spouse name: Not on file   Number of children: Not on file   Years of education: Not on file   Highest education level: Not on file  Occupational History   Not on file  Tobacco Use  Smoking status: Every Day    Packs/day: 0.50    Years: 60.00    Additional pack years: 0.00    Total pack years: 30.00    Types: Cigarettes   Smokeless tobacco: Never   Tobacco comments:    6 cigarettes daily-12/01/2022  Vaping Use   Vaping Use: Never used  Substance and Sexual Activity   Alcohol use: Yes    Alcohol/week: 12.0 standard drinks of alcohol    Types: 12 Standard drinks or equivalent per week    Comment: Drinks 3-4 beers every night   Drug use: No   Sexual activity: Not on file  Other Topics Concern   Not on file  Social History Narrative   Not on file   Social Determinants of Health   Financial Resource Strain: Not on file  Food Insecurity: No Food Insecurity (05/11/2022)   Hunger Vital Sign    Worried About Running Out of Food in the Last Year: Never true    Ran Out of Food in the Last Year: Never true  Transportation Needs: No Transportation Needs (05/11/2022)   PRAPARE - Administrator, Civil Service (Medical): No    Lack of Transportation (Non-Medical): No  Physical Activity: Not on file  Stress: Not on file  Social Connections: Not on file  Intimate Partner Violence: Not At Risk (05/11/2022)   Humiliation, Afraid, Rape, and Kick questionnaire    Fear of Current or Ex-Partner: No    Emotionally Abused: No    Physically Abused: No    Sexually Abused: No     Allergies  Allergen Reactions   Codeine Nausea And Vomiting and Other (See Comments)     CBC    Component Value Date/Time   WBC 9.3 05/11/2022 0635   RBC 3.96 (L) 05/11/2022 0635   HGB 12.9 (L) 05/11/2022 0635   HCT 39.4  05/11/2022 0635   PLT 212 05/11/2022 0635   MCV 99.5 05/11/2022 0635   MCH 32.6 05/11/2022 0635   MCHC 32.7 05/11/2022 0635   RDW 14.9 05/11/2022 0635   LYMPHSABS 1.7 12/10/2018 0928   MONOABS 0.8 12/10/2018 0928   EOSABS 0.2 12/10/2018 0928   BASOSABS 0.1 12/10/2018 0928    Pulmonary Functions Testing Results:     No data to display          Outpatient Medications Prior to Visit  Medication Sig Dispense Refill   chlorpheniramine-HYDROcodone (TUSSIONEX) 10-8 MG/5ML Take 5 mLs by mouth at bedtime as needed.     gabapentin (NEURONTIN) 800 MG tablet Take 800 mg by mouth 2 (two) times daily.      ibuprofen (ADVIL) 800 MG tablet Take 800 mg by mouth every 8 (eight) hours as needed for mild pain.     tamsulosin (FLOMAX) 0.4 MG CAPS capsule Take 0.4 mg by mouth at bedtime.     TRELEGY ELLIPTA 100-62.5-25 MCG/INH AEPB Inhale 1 puff into the lungs daily.     aspirin EC 81 MG tablet Take 81 mg by mouth daily. Swallow whole. (Patient not taking: Reported on 12/01/2022)     atorvastatin (LIPITOR) 80 MG tablet Take 80 mg by mouth daily. (Patient not taking: Reported on 12/01/2022)     clopidogrel (PLAVIX) 75 MG tablet Take 1 tablet (75 mg total) by mouth daily. (Patient not taking: Reported on 12/01/2022) 30 tablet 0   ipratropium-albuterol (DUONEB) 0.5-2.5 (3) MG/3ML SOLN Inhale 3 mLs into the lungs every 4 (four) hours as needed (When he get sick and the doctor  tells him to take it). (Patient not taking: Reported on 12/01/2022)     isosorbide mononitrate (IMDUR) 30 MG 24 hr tablet Take 1 tablet (30 mg total) by mouth daily. (Patient not taking: Reported on 12/01/2022) 30 tablet 0   metoprolol succinate (TOPROL-XL) 25 MG 24 hr tablet Take 1 tablet (25 mg total) by mouth daily. (Patient not taking: Reported on 12/01/2022) 30 tablet 0   nicotine polacrilex (NICORETTE) 2 MG gum Take 1 each (2 mg total) by mouth as needed for smoking cessation. (Patient not taking: Reported on 12/01/2022) 100 tablet 0    No facility-administered medications prior to visit.

## 2023-01-16 ENCOUNTER — Ambulatory Visit
Admission: RE | Admit: 2023-01-16 | Discharge: 2023-01-16 | Disposition: A | Discharge status: Home / Self Care | Payer: Medicare HMO | Source: Ambulatory Visit | Attending: Student in an Organized Health Care Education/Training Program | Admitting: Student in an Organized Health Care Education/Training Program

## 2023-01-16 DIAGNOSIS — J439 Emphysema, unspecified: Secondary | ICD-10-CM | POA: Diagnosis not present

## 2023-01-16 DIAGNOSIS — R911 Solitary pulmonary nodule: Secondary | ICD-10-CM | POA: Insufficient documentation

## 2023-01-23 ENCOUNTER — Other Ambulatory Visit: Payer: Self-pay

## 2023-01-23 DIAGNOSIS — F17210 Nicotine dependence, cigarettes, uncomplicated: Secondary | ICD-10-CM

## 2023-01-23 DIAGNOSIS — Z87891 Personal history of nicotine dependence: Secondary | ICD-10-CM

## 2023-01-23 DIAGNOSIS — Z122 Encounter for screening for malignant neoplasm of respiratory organs: Secondary | ICD-10-CM

## 2023-02-06 ENCOUNTER — Telehealth: Payer: Self-pay | Admitting: Student in an Organized Health Care Education/Training Program

## 2023-02-06 NOTE — Telephone Encounter (Signed)
Pt. Calling back to speak with nurse 

## 2023-02-06 NOTE — Telephone Encounter (Signed)
I have notified the patient of his CT results.  Nothing further needed.

## 2023-02-14 DIAGNOSIS — M792 Neuralgia and neuritis, unspecified: Secondary | ICD-10-CM | POA: Diagnosis not present

## 2023-02-14 DIAGNOSIS — Z720 Tobacco use: Secondary | ICD-10-CM | POA: Diagnosis not present

## 2023-02-14 DIAGNOSIS — J432 Centrilobular emphysema: Secondary | ICD-10-CM | POA: Diagnosis not present

## 2023-02-14 DIAGNOSIS — C3490 Malignant neoplasm of unspecified part of unspecified bronchus or lung: Secondary | ICD-10-CM | POA: Diagnosis not present

## 2023-02-14 DIAGNOSIS — I700 Atherosclerosis of aorta: Secondary | ICD-10-CM | POA: Diagnosis not present

## 2023-02-14 DIAGNOSIS — F411 Generalized anxiety disorder: Secondary | ICD-10-CM | POA: Diagnosis not present

## 2023-02-14 DIAGNOSIS — R7309 Other abnormal glucose: Secondary | ICD-10-CM | POA: Diagnosis not present

## 2023-02-14 DIAGNOSIS — G894 Chronic pain syndrome: Secondary | ICD-10-CM | POA: Diagnosis not present

## 2023-02-14 DIAGNOSIS — I2510 Atherosclerotic heart disease of native coronary artery without angina pectoris: Secondary | ICD-10-CM | POA: Diagnosis not present

## 2023-02-20 DIAGNOSIS — J440 Chronic obstructive pulmonary disease with acute lower respiratory infection: Secondary | ICD-10-CM | POA: Diagnosis not present

## 2023-02-20 DIAGNOSIS — G8912 Acute post-thoracotomy pain: Secondary | ICD-10-CM | POA: Diagnosis not present

## 2023-02-20 DIAGNOSIS — F1090 Alcohol use, unspecified, uncomplicated: Secondary | ICD-10-CM | POA: Diagnosis not present

## 2023-02-20 DIAGNOSIS — J209 Acute bronchitis, unspecified: Secondary | ICD-10-CM | POA: Diagnosis not present

## 2023-02-20 DIAGNOSIS — Z902 Acquired absence of lung [part of]: Secondary | ICD-10-CM | POA: Diagnosis not present

## 2023-02-20 DIAGNOSIS — F4321 Adjustment disorder with depressed mood: Secondary | ICD-10-CM | POA: Diagnosis not present

## 2023-02-20 DIAGNOSIS — F17210 Nicotine dependence, cigarettes, uncomplicated: Secondary | ICD-10-CM | POA: Diagnosis not present

## 2023-02-20 DIAGNOSIS — Z85118 Personal history of other malignant neoplasm of bronchus and lung: Secondary | ICD-10-CM | POA: Diagnosis not present

## 2023-02-20 DIAGNOSIS — J479 Bronchiectasis, uncomplicated: Secondary | ICD-10-CM | POA: Diagnosis not present

## 2023-02-28 DIAGNOSIS — I214 Non-ST elevation (NSTEMI) myocardial infarction: Secondary | ICD-10-CM | POA: Diagnosis not present

## 2023-03-02 ENCOUNTER — Ambulatory Visit: Payer: Medicare HMO | Admitting: Student in an Organized Health Care Education/Training Program

## 2023-03-14 ENCOUNTER — Encounter: Payer: Self-pay | Admitting: Student in an Organized Health Care Education/Training Program

## 2023-03-14 ENCOUNTER — Ambulatory Visit: Payer: Medicare HMO | Admitting: Student in an Organized Health Care Education/Training Program

## 2023-03-14 VITALS — BP 118/72 | HR 73 | Temp 97.9°F | Ht 68.0 in | Wt 132.6 lb

## 2023-03-14 DIAGNOSIS — C3491 Malignant neoplasm of unspecified part of right bronchus or lung: Secondary | ICD-10-CM

## 2023-03-14 DIAGNOSIS — J9811 Atelectasis: Secondary | ICD-10-CM | POA: Diagnosis not present

## 2023-03-14 DIAGNOSIS — J449 Chronic obstructive pulmonary disease, unspecified: Secondary | ICD-10-CM

## 2023-03-14 NOTE — Progress Notes (Signed)
Assessment & Plan:   1. Screening for malignant neoplasm of respiratory organ 2. Lung nodule 3. Tree-in-bud opacities 4. RML collapse 5. Bronchiectasis   [name] presented for the evaluation of areas of patchy consolidation noted in the RLL on his chest CT, with associated tree-in-bud opacities in the RLL and partial collapse of the RLL. Patient is also noted to have bronchiectasis on the CT.  These findings have resolved following a course of antibiotics and pulmonary clearance regimen (hypertonic saline, albuterol, flutter device) with re-expansion of the RML.  Close evaluation of the chest CT shows a possible endobronchial component in the RLL that was present on previous imaging two years ago. There's also narrowing of the RML that appears to be more pronounced compared to April 01, 2021. I suspect there might be a component of mucus plugging, malacia, or bronchial stenosis.    I have asked that [name] continue his Trelegy as well as his pulmonary toilet regimen to include hypertonic saline, nebulized albuterol, and an acapella device. I will see him for follow up in 1 month at which point we will discuss flexible bronchoscopy to evaluate the airway and assess for any endobronchial components. We did discuss that should there be malacia or stenosis, not much could be offered aside from continued bronchial hygiene.  -continue lung cancer screening as before -continue Trelegy once dialy -continue pulmonary clearance regimen with nebulized hypertonic saline -will consider flexible bronchoscopy on follow up   Return in about 4 weeks (around 04/11/2023).  I spent 30 minutes caring for this patient today, including preparing to see the patient, obtaining a medical history , reviewing a separately obtained history, performing a medically appropriate examination and/or evaluation, counseling and educating the patient/family/caregiver, ordering medications, tests, or procedures, documenting  clinical information in the electronic health record, and independently interpreting results (not separately reported/billed) and communicating results to the patient/family/caregiver  Raechel Chute, MD [facility] 03/14/2023 11:09 AM    End of visit medications:  No orders of the defined types were placed in this encounter.    Current Outpatient Medications:    aspirin EC 81 MG tablet, Take 81 mg by mouth daily. Swallow whole., Disp: , Rfl:    gabapentin (NEURONTIN) 800 MG tablet, Take 800 mg by mouth 2 (two) times daily. , Disp: , Rfl:    ibuprofen (ADVIL) 800 MG tablet, Take 800 mg by mouth every 8 (eight) hours as needed for mild pain., Disp: , Rfl:    ipratropium-albuterol (DUONEB) 0.5-2.5 (3) MG/3ML SOLN, Inhale 3 mLs into the lungs every 4 (four) hours as needed (When he get sick and the doctor tells him to take it)., Disp: , Rfl:    metoprolol succinate (TOPROL-XL) 25 MG 24 hr tablet, Take 1 tablet (25 mg total) by mouth daily., Disp: 30 tablet, Rfl: 0   sodium chloride HYPERTONIC 3 % nebulizer solution, Take by nebulization in the morning and at bedtime. Use twice daily, followed by albuterol, followed by flutter device, Disp: 750 mL, Rfl: 12   TRELEGY ELLIPTA 100-62.5-25 MCG/INH AEPB, Inhale 1 puff into the lungs daily., Disp: , Rfl:    atorvastatin (LIPITOR) 80 MG tablet, Take 80 mg by mouth daily. (Patient not taking: Reported on 03/14/2023), Disp: , Rfl:    chlorpheniramine-HYDROcodone (TUSSIONEX) 10-8 MG/5ML, Take 5 mLs by mouth at bedtime as needed. (Patient not taking: Reported on 03/14/2023), Disp: , Rfl:    clopidogrel (PLAVIX) 75 MG tablet, Take 1 tablet (75 mg total) by  mouth daily. (Patient not taking: Reported on 03/14/2023), Disp: 30 tablet, Rfl: 0   isosorbide mononitrate (IMDUR) 30 MG 24 hr tablet, Take 1 tablet (30 mg total) by mouth daily. (Patient not taking: Reported on 03/14/2023), Disp: 30 tablet, Rfl: 0   nicotine polacrilex (NICORETTE) 2 MG gum,  Take 1 each (2 mg total) by mouth as needed for smoking cessation. (Patient not taking: Reported on 03/14/2023), Disp: 100 tablet, Rfl: 0   tamsulosin (FLOMAX) 0.4 MG CAPS capsule, Take 0.4 mg by mouth at bedtime. (Patient not taking: Reported on 03/14/2023), Disp: , Rfl:    Subjective:   PATIENT ID: Stephen Leon GENDER: male DOB: 1946-06-02, MRN: [id number]  Chief Complaint  Patient presents with   Follow-up    CT 01/2023- occ SOB with exertion and non prod cough.    HPI  [name] is a pleasant 77 year old male presenting to clinic for follow up.   [name] was enrolled in our lung cancer screening program and had his first CT on 11/28/2022. This initial screening CT was notable for an area of patchy nodular foci of consolidation in the right lower lobe. On my review, this is also associated with some tree-in-bud nodularity in the RLL. Furthermore, there are bilateral calcified pleural plaques noted on the chest CT. In addition to that, there is atelectasis in the RML.  Following this, we initiated a course of antibiotics and a pulmonary clearance regimen that included hypertonic saline, albuterol, and an acapella device. Repeat chest imaging shows re-expansion of the RML. On my review, there is narrowing of the RML bronchus but the RML is otherwise re-expanded.  He's felt much better overall, though he continues to struggle with exertional dyspnea. The cough is improved, and he denies any chest pain, chest tightness, fevers, or chills.   Patient reports that he has a history of COPD managed with Trelegy. He is has around 2 to 3 COPD exacerbations a year, and receives antibiotics and steroids for them.   [name] is originally from [place], and served in the [employer] for many years. 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 He is a current smoker, and has decreased his smoking to 5 cigarettes a day. He  previously lived for a few years in the [place] of [place] working on [employer] there. He moved to the [place] in the late 80's.   Other past medical history includes a diagnosis of CAD, medically managed on DAPT, and followed by Dr. Darrold Junker. 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 Patient was previously seen by Dr. Meredeth Ide in 2021 for COPD   Ancillary information including prior medications, full medical/surgical/family/social histories, and PFTs (when available) are listed below and have been reviewed.   Review of Systems  Constitutional:  Negative for chills, fever and weight loss.  Respiratory:  Positive for cough, sputum production and shortness of breath.   Cardiovascular:  Negative for chest pain.     Objective:   Vitals:   03/14/23 1048  BP: 118/72  Pulse: 73  Temp: 97.9 F (36.6 C)  TempSrc: Temporal  SpO2: 95%  Weight: 132 lb 9.6 oz (60.1 kg)  Height: 5\' 8"  (1.727 m)   95% on RA  BMI Readings from Last 3 Encounters:  03/14/23 20.16 kg/m  12/01/22 20.74 kg/m  10/27/22 21.46 kg/m   Wt Readings from Last 3 Encounters:  03/14/23 132 lb 9.6 oz (  60.1 kg)  12/01/22 136 lb 6.4 oz (61.9 kg)  10/27/22 137 lb (62.1 kg)    Physical Exam Constitutional:      Appearance: Normal appearance.  Cardiovascular:     Rate and Rhythm: Normal rate and regular rhythm.     Pulses: Normal pulses.     Heart sounds: No murmur heard. Pulmonary:     Effort: Pulmonary effort is normal.     Breath sounds: Rales present. No wheezing.  Abdominal:     Palpations: Abdomen is soft.  Neurological:     General: No focal deficit present.     Mental Status: He is alert and oriented to person, place, and time. Mental status is at baseline.       Ancillary Information    Past Medical History:  Diagnosis Date   Anxiety    Bronchitis    Cancer (HCC) 2016   COPD (chronic obstructive pulmonary disease) (HCC)    Cough 07/13/2007   Qualifier:  Diagnosis of  By: Nena Jordan    Dyspnea    due to copd   Hypertension    pt denies this and is not on bp meds but vascular note states htn   LUNG NODULE 07/13/2007   Qualifier: Diagnosis of  By: Nena Jordan    Pneumonia 2013   Thoracic aortic aneurysm [facility])    followed by vascular     Family History  Problem Relation Age of Onset   COPD Mother      Past Surgical History:  Procedure Laterality Date   COLONOSCOPY N/A 10/27/2022   Procedure: COLONOSCOPY;  Surgeon: Jaynie Collins, DO;  Location: [facility] ENDOSCOPY;  Service: Gastroenterology;  Laterality: N/A;   CORONARY PRESSURE/FFR STUDY N/A 05/19/2020   Procedure: INTRAVASCULAR PRESSURE WIRE/FFR STUDY;  Surgeon: Marcina Millard, MD;  Location: [facility] INVASIVE CV LAB;  Service: Cardiovascular;  Laterality: N/A;   DIAGNOSTIC LAPAROSCOPY     ESOPHAGOGASTRODUODENOSCOPY N/A 10/27/2022   Procedure: ESOPHAGOGASTRODUODENOSCOPY (EGD);  Surgeon: Jaynie Collins, DO;  Location: [facility] ENDOSCOPY;  Service: Gastroenterology;  Laterality: N/A;   EYE SURGERY Bilateral    HERNIA REPAIR     KYPHOPLASTY N/A 12/13/2018   Procedure: KYPHOPLASTY- T7;  Surgeon: Kennedy Bucker, MD;  Location: [facility] ORS;  Service: Orthopedics;  Laterality: N/A;   LEFT HEART CATH N/A 05/11/2022   Procedure: Left Heart Cath;  Surgeon: Marcina Millard, MD;  Location: [facility] INVASIVE CV LAB;  Service: Cardiovascular;  Laterality: N/A;   LEFT HEART CATH AND CORONARY ANGIOGRAPHY Left 05/19/2020   Procedure: LEFT HEART CATH AND CORONARY ANGIOGRAPHY;  Surgeon: Marcina Millard, MD;  Location: [facility] INVASIVE CV LAB;  Service: Cardiovascular;  Laterality: Left;   LUNG LOBECTOMY Right    upper lung due to asbestos exposure   XI ROBOTIC ASSISTED INGUINAL HERNIA REPAIR WITH MESH Right 07/19/2019   Procedure: XI ROBOTIC ASSISTED INGUINAL HERNIA REPAIR WITH MESH;  Surgeon: Sung Amabile, DO;  Location: [facility] ORS;  Service: General;  Laterality: Right;    Social  History   Socioeconomic History   Marital status: Married    Spouse name: Not on file   Number of children: Not on file   Years of education: Not on file   Highest education level: Not on file  Occupational History   Not on file  Tobacco Use   Smoking status: Every Day    Current packs/day: 0.50    Average packs/day: 0.5 packs/day for 60.0 years (30.0 ttl pk-yrs)    Types: Cigarettes  Smokeless tobacco: Never   Tobacco comments:    5-10cigarettes daily-03/14/2023  Vaping Use   Vaping status: Never Used  Substance and Sexual Activity   Alcohol use: Yes    Alcohol/week: 12.0 standard drinks of alcohol    Types: 12 Standard drinks or equivalent per week    Comment: Drinks 3-4 beers every night   Drug use: No   Sexual activity: Not on file  Other Topics Concern   Not on file  Social History Narrative   Not on file   Social Determinants of Health   Financial Resource Strain: Low Risk  (02/20/2023)   Received from [facility]   Overall Financial Resource Strain (CARDIA)    Difficulty of Paying Living Expenses: Not hard at all  Food Insecurity: No Food Insecurity (02/20/2023)   Received from [facility]   Hunger Vital Sign    Worried About Running Out of Food in the Last Year: Never true    Ran Out of Food in the Last Year: Never true  Transportation Needs: No Transportation Needs (02/20/2023)   Received from [facility] - Transportation    In the past 12 months, has lack of transportation kept you from medical appointments or from getting medications?: No    Lack of Transportation (Non-Medical): No  Physical Activity: Not on file  Stress: Not on file  Social Connections: Unknown (12/20/2021)   Received from [facility]   Social Network    Social Network: Not on file  Intimate Partner Violence: Not At Risk (05/11/2022)   Humiliation, Afraid, Rape, and Kick questionnaire    Fear of Current or Ex-Partner: No     Emotionally Abused: No    Physically Abused: No    Sexually Abused: No     Allergies  Allergen Reactions   Codeine Nausea And Vomiting and Other (See Comments)     CBC    Component Value Date/Time   WBC 9.3 05/11/2022 0635   RBC 3.96 (L) 05/11/2022 0635   HGB 12.9 (L) 05/11/2022 0635   HCT 39.4 05/11/2022 0635   PLT 212 05/11/2022 0635   MCV 99.5 05/11/2022 0635   MCH 32.6 05/11/2022 0635   MCHC 32.7 05/11/2022 0635   RDW 14.9 05/11/2022 0635   LYMPHSABS 1.7 12/10/2018 0928   MONOABS 0.8 12/10/2018 0928   EOSABS 0.2 12/10/2018 0928   BASOSABS 0.1 12/10/2018 0160    Pulmonary Functions Testing Results:     No data to display          Outpatient Medications Prior to Visit  Medication Sig Dispense Refill   aspirin EC 81 MG tablet Take 81 mg by mouth daily. Swallow whole.     gabapentin (NEURONTIN) 800 MG tablet Take 800 mg by mouth 2 (two) times daily.      ibuprofen (ADVIL) 800 MG tablet Take 800 mg by mouth every 8 (eight) hours as needed for mild pain.     ipratropium-albuterol (DUONEB) 0.5-2.5 (3) MG/3ML SOLN Inhale 3 mLs into the lungs every 4 (four) hours as needed (When he get sick and the doctor tells him to take it).     metoprolol succinate (TOPROL-XL) 25 MG 24 hr tablet Take 1 tablet (25 mg total) by mouth daily. 30 tablet 0   sodium chloride HYPERTONIC 3 % nebulizer solution Take by nebulization in the morning and at bedtime. Use twice daily, followed by albuterol, followed by flutter device 750 mL 12   TRELEGY  ELLIPTA 100-62.5-25 MCG/INH AEPB Inhale 1 puff into the lungs daily.     atorvastatin (LIPITOR) 80 MG tablet Take 80 mg by mouth daily. (Patient not taking: Reported on 03/14/2023)     chlorpheniramine-HYDROcodone (TUSSIONEX) 10-8 MG/5ML Take 5 mLs by mouth at bedtime as needed. (Patient not taking: Reported on 03/14/2023)     clopidogrel (PLAVIX) 75 MG tablet Take 1 tablet (75 mg total) by mouth daily. (Patient not taking: Reported on 03/14/2023) 30  tablet 0   isosorbide mononitrate (IMDUR) 30 MG 24 hr tablet Take 1 tablet (30 mg total) by mouth daily. (Patient not taking: Reported on 03/14/2023) 30 tablet 0   nicotine polacrilex (NICORETTE) 2 MG gum Take 1 each (2 mg total) by mouth as needed for smoking cessation. (Patient not taking: Reported on 03/14/2023) 100 tablet 0   tamsulosin (FLOMAX) 0.4 MG CAPS capsule Take 0.4 mg by mouth at bedtime. (Patient not taking: Reported on 03/14/2023)     No facility-administered medications prior to visit.

## 2023-03-23 DIAGNOSIS — Z86010 Personal history of colonic polyps: Secondary | ICD-10-CM | POA: Diagnosis not present

## 2023-03-23 DIAGNOSIS — K219 Gastro-esophageal reflux disease without esophagitis: Secondary | ICD-10-CM | POA: Diagnosis not present

## 2023-03-23 DIAGNOSIS — K259 Gastric ulcer, unspecified as acute or chronic, without hemorrhage or perforation: Secondary | ICD-10-CM | POA: Diagnosis not present

## 2023-04-20 ENCOUNTER — Ambulatory Visit
Admission: RE | Admit: 2023-04-20 | Discharge: 2023-04-20 | Disposition: A | Discharge status: Home / Self Care | Payer: Medicare HMO | Attending: Student in an Organized Health Care Education/Training Program | Admitting: Student in an Organized Health Care Education/Training Program

## 2023-04-20 ENCOUNTER — Ambulatory Visit: Payer: Medicare HMO | Admitting: Student in an Organized Health Care Education/Training Program

## 2023-04-20 ENCOUNTER — Encounter: Payer: Self-pay | Admitting: Student in an Organized Health Care Education/Training Program

## 2023-04-20 ENCOUNTER — Ambulatory Visit
Admission: RE | Admit: 2023-04-20 | Discharge: 2023-04-20 | Disposition: A | Discharge status: Home / Self Care | Payer: Medicare HMO | Source: Ambulatory Visit | Attending: Student in an Organized Health Care Education/Training Program | Admitting: Student in an Organized Health Care Education/Training Program

## 2023-04-20 VITALS — BP 120/70 | HR 68 | Temp 97.6°F | Ht 68.0 in | Wt 134.2 lb

## 2023-04-20 DIAGNOSIS — J449 Chronic obstructive pulmonary disease, unspecified: Secondary | ICD-10-CM | POA: Diagnosis not present

## 2023-04-20 DIAGNOSIS — R0602 Shortness of breath: Secondary | ICD-10-CM

## 2023-04-20 DIAGNOSIS — J479 Bronchiectasis, uncomplicated: Secondary | ICD-10-CM

## 2023-04-20 DIAGNOSIS — Z23 Encounter for immunization: Secondary | ICD-10-CM

## 2023-04-20 DIAGNOSIS — J929 Pleural plaque without asbestos: Secondary | ICD-10-CM | POA: Diagnosis not present

## 2023-04-20 MED ORDER — LEVOFLOXACIN 750 MG PO TABS: 750.0000 mg | ORAL_TABLET | Freq: Every day | ORAL | 0 refills | Status: DC

## 2023-04-20 NOTE — Progress Notes (Signed)
Assessment & Plan:   #Shortness of Breath #RML collapse #Bronchiectasis #COPD   [name] presented for the evaluation of areas of patchy consolidation noted in the RLL on his chest CT, with associated tree-in-bud opacities in the RLL and partial collapse of the RLL. Patient is also noted to have bronchiectasis on the CT. These findings resolved following a course of antibiotics and pulmonary clearance regimen (hypertonic saline, albuterol, flutter device) with re-expansion of the RML.   Close evaluation of the chest CT shows a possible endobronchial component in the RLL that was present on previous imaging two years ago. There's also narrowing of the RML that appears to be more pronounced compared to 2021/05/07. I suspect there might be a component of mucus plugging, malacia, or bronchial stenosis. We discussed this today. Given high likelihood of stenosis and stability of findings over multiple repeat images, I will hold off on bronchoscopy at the time being. I will review his follow up CT and re-consider bronchoscopy at that point.    I have asked that [name] continue his Trelegy as well as his pulmonary toilet regimen to include hypertonic saline, nebulized albuterol, and an acapella device. I will also obtain a chest xray today and order a short course of antibiotics given increase in symptoms.   -continue lung cancer screening as before -continue Trelegy once dialy -continue pulmonary clearance regimen with nebulized hypertonic saline - DG Chest 2 View; Future - levofloxacin (LEVAQUIN) 750 MG tablet; Take 1 tablet (750 mg total) by mouth daily.  Dispense: 5 tablet; Refill: 0  #Need for influenza vaccination  - Flu Vaccine Trivalent High Dose (Fluad)   Return in about 10 months (around 02/17/2024).  I spent 30 minutes caring for this patient today, including preparing to see the patient, obtaining a medical history , reviewing a separately obtained history, performing a medically  appropriate examination and/or evaluation, counseling and educating the patient/family/caregiver, ordering medications, tests, or procedures, and documenting clinical information in the electronic health record  Raechel Chute, MD [facility] 04/20/2023 2:38 PM    End of visit medications:  Meds ordered this encounter  Medications   levofloxacin (LEVAQUIN) 750 MG tablet    Sig: Take 1 tablet (750 mg total) by mouth daily.    Dispense:  5 tablet    Refill:  0     Current Outpatient Medications:    aspirin EC 81 MG tablet, Take 81 mg by mouth daily. Swallow whole., Disp: , Rfl:    gabapentin (NEURONTIN) 800 MG tablet, Take 800 mg by mouth 2 (two) times daily. , Disp: , Rfl:    ibuprofen (ADVIL) 800 MG tablet, Take 800 mg by mouth every 8 (eight) hours as needed for mild pain., Disp: , Rfl:    ipratropium-albuterol (DUONEB) 0.5-2.5 (3) MG/3ML SOLN, Inhale 3 mLs into the lungs every 4 (four) hours as needed (When he get sick and the doctor tells him to take it)., Disp: , Rfl:    isosorbide mononitrate (IMDUR) 30 MG 24 hr tablet, Take 1 tablet (30 mg total) by mouth daily., Disp: 30 tablet, Rfl: 0   levofloxacin (LEVAQUIN) 750 MG tablet, Take 1 tablet (750 mg total) by mouth daily., Disp: 5 tablet, Rfl: 0   tamsulosin (FLOMAX) 0.4 MG CAPS capsule, Take 0.4 mg by mouth at bedtime., Disp: , Rfl:    TRELEGY ELLIPTA 100-62.5-25 MCG/INH AEPB, Inhale 1 puff into the lungs daily., Disp: , Rfl:    atorvastatin (LIPITOR) 80 MG tablet, Take  80 mg by mouth daily. (Patient not taking: Reported on 03/14/2023), Disp: , Rfl:    chlorpheniramine-HYDROcodone (TUSSIONEX) 10-8 MG/5ML, Take 5 mLs by mouth at bedtime as needed. (Patient not taking: Reported on 03/14/2023), Disp: , Rfl:    clopidogrel (PLAVIX) 75 MG tablet, Take 1 tablet (75 mg total) by mouth daily. (Patient not taking: Reported on 03/14/2023), Disp: 30 tablet, Rfl: 0   metoprolol succinate (TOPROL-XL) 25 MG 24 hr tablet, Take 1  tablet (25 mg total) by mouth daily. (Patient not taking: Reported on 04/20/2023), Disp: 30 tablet, Rfl: 0   nicotine polacrilex (NICORETTE) 2 MG gum, Take 1 each (2 mg total) by mouth as needed for smoking cessation. (Patient not taking: Reported on 03/14/2023), Disp: 100 tablet, Rfl: 0   omeprazole (PRILOSEC) 40 MG capsule, Take 40 mg by mouth daily. (Patient not taking: Reported on 04/20/2023), Disp: , Rfl:    sodium chloride HYPERTONIC 3 % nebulizer solution, Take by nebulization in the morning and at bedtime. Use twice daily, followed by albuterol, followed by flutter device (Patient not taking: Reported on 04/20/2023), Disp: 750 mL, Rfl: 12   Subjective:   PATIENT ID: [name] GENDER: male DOB: 31-Mar-1946, MRN: [id number]  Chief Complaint  Patient presents with   Follow-up    Patient reports cough, SOB and wheezing.     HPI  [name] is a pleasant 77 year old male presenting to clinic for follow up.   [name] was enrolled in our lung cancer screening program and had his first CT on 11/28/2022. This initial screening CT was notable for an area of patchy nodular foci of consolidation in the right lower lobe. On my review, this is also associated with some tree-in-bud nodularity in the RLL. Furthermore, there are bilateral calcified pleural plaques noted on the chest CT. In addition to that, there is atelectasis in the RML.   Following this, we initiated a course of antibiotics and a pulmonary clearance regimen that included hypertonic saline, albuterol, and an acapella device. Repeat chest imaging shows re-expansion of the RML. On my review, there is narrowing of the RML bronchus but the RML is otherwise re-expanded. RML narrowing was present in 2022 but appears slightly worse on the most recent image.  He presents for follow up today. He is compliant with Trelegy. He continues to experience exertional dyspnea. His cough remains improved, albeit persistent. He denies any  other symptoms such as wheezing, chest pain, fevers, or chills.   Patient reports that he has a history of COPD managed with Trelegy. He has around 2 to 3 COPD exacerbations a year, and receives antibiotics and steroids for them.   [name] is originally from [place], and served in the [employer] for many years. 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 He is a current smoker, and has decreased his smoking to 5 cigarettes a day. He previously lived for a few years in the [place] of [place] working on [employer] there. He moved to the [place] in the late 80's.   Other past medical history includes a diagnosis of CAD, medically managed on DAPT, and followed by Dr. Darrold Junker. 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 Patient was previously seen by Dr. Meredeth Ide in 2021 for COPD   Ancillary information including prior medications, full medical/surgical/family/social histories, and PFTs (when available) are listed below and have been reviewed.   Review of Systems  Constitutional:  Negative for chills, fever and weight loss.  Respiratory:  Positive for cough and shortness of breath. Negative for sputum production.   Cardiovascular:  Negative for chest pain.     Objective:   Vitals:   04/20/23 1416  BP: 120/70  Pulse: 68  Temp: 97.6 F (36.4 C)  TempSrc: Temporal  SpO2: 94%  Weight: 134 lb 3.2 oz (60.9 kg)  Height: 5\' 8"  (1.727 m)   94% on RA BMI Readings from Last 3 Encounters:  04/20/23 20.41 kg/m  03/14/23 20.16 kg/m  12/01/22 20.74 kg/m   Wt Readings from Last 3 Encounters:  04/20/23 134 lb 3.2 oz (60.9 kg)  03/14/23 132 lb 9.6 oz (60.1 kg)  12/01/22 136 lb 6.4 oz (61.9 kg)    Physical Exam Constitutional:      Appearance: Normal appearance.  Cardiovascular:     Rate and Rhythm: Normal rate and regular rhythm.     Pulses: Normal pulses.     Heart sounds: No murmur  heard. Pulmonary:     Effort: Pulmonary effort is normal.     Breath sounds: No wheezing or rales.  Abdominal:     Palpations: Abdomen is soft.  Neurological:     General: No focal deficit present.     Mental Status: He is alert and oriented to person, place, and time. Mental status is at baseline.       Ancillary Information    Past Medical History:  Diagnosis Date   Anxiety    Bronchitis    Cancer (HCC) 2016   COPD (chronic obstructive pulmonary disease) (HCC)    Cough 07/13/2007   Qualifier: Diagnosis of  By: Nena Jordan    Dyspnea    due to copd   Hypertension    pt denies this and is not on bp meds but vascular note states htn   LUNG NODULE 07/13/2007   Qualifier: Diagnosis of  By: Nena Jordan    Pneumonia 2013   Thoracic aortic aneurysm [facility])    followed by vascular     Family History  Problem Relation Age of Onset   COPD Mother      Past Surgical History:  Procedure Laterality Date   COLONOSCOPY N/A 10/27/2022   Procedure: COLONOSCOPY;  Surgeon: Jaynie Collins, DO;  Location: [facility] ENDOSCOPY;  Service: Gastroenterology;  Laterality: N/A;   CORONARY PRESSURE/FFR STUDY N/A 05/19/2020   Procedure: INTRAVASCULAR PRESSURE WIRE/FFR STUDY;  Surgeon: Marcina Millard, MD;  Location: [facility] INVASIVE CV LAB;  Service: Cardiovascular;  Laterality: N/A;   DIAGNOSTIC LAPAROSCOPY     ESOPHAGOGASTRODUODENOSCOPY N/A 10/27/2022   Procedure: ESOPHAGOGASTRODUODENOSCOPY (EGD);  Surgeon: Jaynie Collins, DO;  Location: [facility] ENDOSCOPY;  Service: Gastroenterology;  Laterality: N/A;   EYE SURGERY Bilateral    HERNIA REPAIR     KYPHOPLASTY N/A 12/13/2018   Procedure: KYPHOPLASTY- T7;  Surgeon: Kennedy Bucker, MD;  Location: [facility] ORS;  Service: Orthopedics;  Laterality: N/A;   LEFT HEART CATH N/A 05/11/2022   Procedure: Left Heart Cath;  Surgeon: Marcina Millard, MD;  Location: [facility] INVASIVE CV LAB;  Service: Cardiovascular;  Laterality: N/A;   LEFT HEART  CATH AND CORONARY ANGIOGRAPHY Left 05/19/2020   Procedure: LEFT HEART CATH AND CORONARY ANGIOGRAPHY;  Surgeon: Marcina Millard, MD;  Location: [facility] INVASIVE CV LAB;  Service: Cardiovascular;  Laterality: Left;   LUNG LOBECTOMY Right    upper lung due to asbestos exposure   XI ROBOTIC ASSISTED INGUINAL HERNIA REPAIR WITH MESH Right 07/19/2019  Procedure: XI ROBOTIC ASSISTED INGUINAL HERNIA REPAIR WITH MESH;  Surgeon: Sung Amabile, DO;  Location: [facility] ORS;  Service: General;  Laterality: Right;    Social History   Socioeconomic History   Marital status: Married    Spouse name: Not on file   Number of children: Not on file   Years of education: Not on file   Highest education level: Not on file  Occupational History   Not on file  Tobacco Use   Smoking status: Every Day    Current packs/day: 0.50    Average packs/day: 0.5 packs/day for 60.0 years (30.0 ttl pk-yrs)    Types: Cigarettes   Smokeless tobacco: Never   Tobacco comments:    5-10cigarettes daily-03/14/2023  Vaping Use   Vaping status: Never Used  Substance and Sexual Activity   Alcohol use: Yes    Alcohol/week: 12.0 standard drinks of alcohol    Types: 12 Standard drinks or equivalent per week    Comment: Drinks 3-4 beers every night   Drug use: No   Sexual activity: Not on file  Other Topics Concern   Not on file  Social History Narrative   Not on file   Social Determinants of Health   Financial Resource Strain: Low Risk  (02/20/2023)   Received from [facility]   Overall Financial Resource Strain (CARDIA)    Difficulty of Paying Living Expenses: Not hard at all  Food Insecurity: No Food Insecurity (02/20/2023)   [facility]   Hunger Vital Sign    Worried About Running Out of Food in the Last Year: Never true    Ran Out of Food in the Last Year: Never true  Transportation Needs: No Transportation Needs (02/20/2023)   Received from [facility] - Transportation    In the past 12 months, has lack of transportation kept you from medical appointments or from getting medications?: No    Lack of Transportation (Non-Medical): No  Physical Activity: Not on file  Stress: Not on file  Social Connections: Unknown (12/20/2021)   [facility]   Social Network    Social Network: Not on file  Intimate Partner Violence: Not At Risk (05/11/2022)   Humiliation, Afraid, Rape, and Kick questionnaire    Fear of Current or Ex-Partner: No    Emotionally Abused: No    Physically Abused: No    Sexually Abused: No     Allergies  Allergen Reactions   Codeine Nausea And Vomiting and Other (See Comments)     CBC    Component Value Date/Time   WBC 9.3 05/11/2022 0635   RBC 3.96 (L) 05/11/2022 0635   HGB 12.9 (L) 05/11/2022 0635   HCT 39.4 05/11/2022 0635   PLT 212 05/11/2022 0635   MCV 99.5 05/11/2022 0635   MCH 32.6 05/11/2022 0635   MCHC 32.7 05/11/2022 0635   RDW 14.9 05/11/2022 0635   LYMPHSABS 1.7 12/10/2018 0928   MONOABS 0.8 12/10/2018 0928   EOSABS 0.2 12/10/2018 0928   BASOSABS 0.1 12/10/2018 2956    Pulmonary Functions Testing Results:     No data to display          Outpatient Medications Prior to Visit  Medication Sig Dispense Refill   aspirin EC 81 MG tablet Take 81 mg by mouth daily. Swallow whole.     gabapentin (NEURONTIN) 800 MG tablet Take 800 mg by mouth 2 (two) times daily.  ibuprofen (ADVIL) 800 MG tablet Take 800 mg by mouth every 8 (eight) hours as needed for mild pain.     ipratropium-albuterol (DUONEB) 0.5-2.5 (3) MG/3ML SOLN Inhale 3 mLs into the lungs every 4 (four) hours as needed (When he get sick and the doctor tells him to take it).     isosorbide mononitrate (IMDUR) 30 MG 24 hr tablet Take 1 tablet (30 mg total) by mouth daily. 30 tablet 0   tamsulosin (FLOMAX) 0.4 MG CAPS capsule Take 0.4 mg by mouth at bedtime.     TRELEGY ELLIPTA 100-62.5-25  MCG/INH AEPB Inhale 1 puff into the lungs daily.     atorvastatin (LIPITOR) 80 MG tablet Take 80 mg by mouth daily. (Patient not taking: Reported on 03/14/2023)     chlorpheniramine-HYDROcodone (TUSSIONEX) 10-8 MG/5ML Take 5 mLs by mouth at bedtime as needed. (Patient not taking: Reported on 03/14/2023)     clopidogrel (PLAVIX) 75 MG tablet Take 1 tablet (75 mg total) by mouth daily. (Patient not taking: Reported on 03/14/2023) 30 tablet 0   metoprolol succinate (TOPROL-XL) 25 MG 24 hr tablet Take 1 tablet (25 mg total) by mouth daily. (Patient not taking: Reported on 04/20/2023) 30 tablet 0   nicotine polacrilex (NICORETTE) 2 MG gum Take 1 each (2 mg total) by mouth as needed for smoking cessation. (Patient not taking: Reported on 03/14/2023) 100 tablet 0   omeprazole (PRILOSEC) 40 MG capsule Take 40 mg by mouth daily. (Patient not taking: Reported on 04/20/2023)     sodium chloride HYPERTONIC 3 % nebulizer solution Take by nebulization in the morning and at bedtime. Use twice daily, followed by albuterol, followed by flutter device (Patient not taking: Reported on 04/20/2023) 750 mL 12   No facility-administered medications prior to visit.

## 2023-04-28 ENCOUNTER — Other Ambulatory Visit: Payer: Self-pay | Admitting: Otolaryngology

## 2023-04-28 DIAGNOSIS — R221 Localized swelling, mass and lump, neck: Secondary | ICD-10-CM

## 2023-04-28 DIAGNOSIS — K219 Gastro-esophageal reflux disease without esophagitis: Secondary | ICD-10-CM | POA: Diagnosis not present

## 2023-05-02 ENCOUNTER — Ambulatory Visit
Admission: RE | Admit: 2023-05-02 | Discharge: 2023-05-02 | Disposition: A | Discharge status: Home / Self Care | Payer: Medicare HMO | Source: Ambulatory Visit | Attending: Otolaryngology | Admitting: Otolaryngology

## 2023-05-02 DIAGNOSIS — R221 Localized swelling, mass and lump, neck: Secondary | ICD-10-CM

## 2023-05-02 DIAGNOSIS — J383 Other diseases of vocal cords: Secondary | ICD-10-CM | POA: Diagnosis not present

## 2023-05-02 DIAGNOSIS — R490 Dysphonia: Secondary | ICD-10-CM | POA: Diagnosis not present

## 2023-05-02 MED ORDER — IOPAMIDOL (ISOVUE-300) INJECTION 61%
75.0000 mL | Freq: Once | INTRAVENOUS | Status: AC | PRN
  Administered 2023-05-02: 75 mL via INTRAVENOUS

## 2023-05-08 ENCOUNTER — Telehealth: Payer: Self-pay | Admitting: Student in an Organized Health Care Education/Training Program

## 2023-05-08 NOTE — Telephone Encounter (Signed)
Wife states pt has been feeling poorly. Pulse ox got down to 80%today. States he is sob and unable to speak due to lack of air is asking what they should do. Is also looking for the results from 05/02/23 testing

## 2023-05-08 NOTE — Telephone Encounter (Signed)
Patient is aware of recommendations and voiced his understanding.  He stated that he would go to the ED first thing in the morning.  I suggested that he go to the ED tonight if spo2 drops again. He voiced his understanding.   Routing to Dr. Aundria Rud as an Lorain Childes.

## 2023-05-08 NOTE — Telephone Encounter (Signed)
Called and spoke to patient.  He reports of prod cough with white sputum and increased SOB x2d Denied f/c/s or additional sx.  He does not wear supplemental oxygen. Spo2 has dropped as low as 80% on roomair yesterday.  Spo2 92% during our conversation.  No recent covid test.  He is using Duoneb BID and trelegy once daily.   Dr. Aundria Rud, please advise. Thanks

## 2023-05-09 ENCOUNTER — Other Ambulatory Visit
Admission: RE | Admit: 2023-05-09 | Discharge: 2023-05-09 | Disposition: A | Discharge status: Home / Self Care | Payer: Medicare HMO | Source: Ambulatory Visit | Attending: Student | Admitting: Student

## 2023-05-09 DIAGNOSIS — R051 Acute cough: Secondary | ICD-10-CM | POA: Insufficient documentation

## 2023-05-09 DIAGNOSIS — J449 Chronic obstructive pulmonary disease, unspecified: Secondary | ICD-10-CM | POA: Diagnosis not present

## 2023-05-09 DIAGNOSIS — R0602 Shortness of breath: Secondary | ICD-10-CM | POA: Diagnosis not present

## 2023-05-09 DIAGNOSIS — I10 Essential (primary) hypertension: Secondary | ICD-10-CM | POA: Diagnosis not present

## 2023-05-09 DIAGNOSIS — J209 Acute bronchitis, unspecified: Secondary | ICD-10-CM | POA: Diagnosis not present

## 2023-05-09 DIAGNOSIS — Z03818 Encounter for observation for suspected exposure to other biological agents ruled out: Secondary | ICD-10-CM | POA: Diagnosis not present

## 2023-05-09 DIAGNOSIS — C3490 Malignant neoplasm of unspecified part of unspecified bronchus or lung: Secondary | ICD-10-CM | POA: Diagnosis not present

## 2023-05-09 LAB — BRAIN NATRIURETIC PEPTIDE: B Natriuretic Peptide: 141.1 pg/mL — ABNORMAL HIGH (ref 0.0–100.0)

## 2023-05-18 DIAGNOSIS — F419 Anxiety disorder, unspecified: Secondary | ICD-10-CM | POA: Diagnosis not present

## 2023-05-18 DIAGNOSIS — J470 Bronchiectasis with acute lower respiratory infection: Secondary | ICD-10-CM | POA: Diagnosis not present

## 2023-05-18 DIAGNOSIS — Z85118 Personal history of other malignant neoplasm of bronchus and lung: Secondary | ICD-10-CM | POA: Diagnosis not present

## 2023-05-18 DIAGNOSIS — Z0000 Encounter for general adult medical examination without abnormal findings: Secondary | ICD-10-CM | POA: Diagnosis not present

## 2023-05-18 DIAGNOSIS — J209 Acute bronchitis, unspecified: Secondary | ICD-10-CM | POA: Diagnosis not present

## 2023-05-18 DIAGNOSIS — I2510 Atherosclerotic heart disease of native coronary artery without angina pectoris: Secondary | ICD-10-CM | POA: Diagnosis not present

## 2023-05-18 DIAGNOSIS — G8912 Acute post-thoracotomy pain: Secondary | ICD-10-CM | POA: Diagnosis not present

## 2023-05-18 DIAGNOSIS — F17210 Nicotine dependence, cigarettes, uncomplicated: Secondary | ICD-10-CM | POA: Diagnosis not present

## 2023-05-18 DIAGNOSIS — F4321 Adjustment disorder with depressed mood: Secondary | ICD-10-CM | POA: Diagnosis not present

## 2023-05-23 DIAGNOSIS — I2510 Atherosclerotic heart disease of native coronary artery without angina pectoris: Secondary | ICD-10-CM | POA: Diagnosis not present

## 2023-05-23 DIAGNOSIS — Z902 Acquired absence of lung [part of]: Secondary | ICD-10-CM | POA: Diagnosis not present

## 2023-05-23 DIAGNOSIS — I214 Non-ST elevation (NSTEMI) myocardial infarction: Secondary | ICD-10-CM | POA: Diagnosis not present

## 2023-05-23 DIAGNOSIS — I10 Essential (primary) hypertension: Secondary | ICD-10-CM | POA: Diagnosis not present

## 2023-05-23 DIAGNOSIS — Z720 Tobacco use: Secondary | ICD-10-CM | POA: Diagnosis not present

## 2023-05-23 DIAGNOSIS — R0602 Shortness of breath: Secondary | ICD-10-CM | POA: Diagnosis not present

## 2023-05-23 DIAGNOSIS — Z9889 Other specified postprocedural states: Secondary | ICD-10-CM | POA: Diagnosis not present

## 2023-06-09 DIAGNOSIS — K115 Sialolithiasis: Secondary | ICD-10-CM | POA: Diagnosis not present

## 2023-06-09 DIAGNOSIS — R490 Dysphonia: Secondary | ICD-10-CM | POA: Diagnosis not present

## 2023-06-09 DIAGNOSIS — K219 Gastro-esophageal reflux disease without esophagitis: Secondary | ICD-10-CM | POA: Diagnosis not present

## 2023-06-22 ENCOUNTER — Encounter: Payer: Self-pay | Admitting: Gastroenterology

## 2023-07-04 ENCOUNTER — Encounter: Payer: Self-pay | Admitting: Gastroenterology

## 2023-07-12 NOTE — H&P (Signed)
Pre-Procedure H&P   Patient ID: Stephen Leon is a 77 y.o. male.  Gastroenterology Provider: Jaynie Collins, DO  Referring Provider: Tawni Pummel, PA PCP: Barbette Reichmann, MD  Date: 07/12/2023  HPI [name] is a 77 y.o. male who presents today for Colonoscopy for FIT positive, personal history of colon polyps .  Patient underwent colonoscopy and EGD in March 2024 after FIT positive in 07/2022.  Bowels have been moving regularly without melena or hematochezia. EGD demonstrated gastric ulcers and Barrett's esophagus without dysplasia.  Ulcers related to heavy NSAID use.  He has cut down from 3 times a day to once daily 800 mg ibuprofen.  Biopsies negative for H. Pylori.  Colonoscopy demonstrated poor prep with left-sided diverticulosis.  A 12 to 14 mm sessile serrated polyp was removed from the descending colon.  He had at least 3 adenomatous polyps in the transverse colon and rectum.  Prep was inadequate at that time and he is undergoing repeat colonoscopy. 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   Past Medical History:  Diagnosis Date   Anxiety    Bronchitis    Cancer (HCC) 2016   COPD (chronic obstructive pulmonary disease) (HCC)    Cough 07/13/2007   Qualifier: Diagnosis of  By: Nena Jordan    Dyspnea    due to copd   Hypertension    pt denies this and is not on bp meds but vascular note states htn   LUNG NODULE 07/13/2007   Qualifier: Diagnosis of  By: Nena Jordan    Pneumonia 2013   Thoracic aortic aneurysm [facility])    followed by vascular    Past Surgical History:  Procedure Laterality Date   COLONOSCOPY N/A 10/27/2022   Procedure: COLONOSCOPY;  Surgeon: Jaynie Collins, DO;  Location: [facility] ENDOSCOPY;  Service: Gastroenterology;  Laterality: N/A;   CORONARY PRESSURE/FFR STUDY N/A 05/19/2020    Procedure: INTRAVASCULAR PRESSURE WIRE/FFR STUDY;  Surgeon: Marcina Millard, MD;  Location: [facility] INVASIVE CV LAB;  Service: Cardiovascular;  Laterality: N/A;   DIAGNOSTIC LAPAROSCOPY     ESOPHAGOGASTRODUODENOSCOPY N/A 10/27/2022   Procedure: ESOPHAGOGASTRODUODENOSCOPY (EGD);  Surgeon: Jaynie Collins, DO;  Location: [facility] ENDOSCOPY;  Service: Gastroenterology;  Laterality: N/A;   EYE SURGERY Bilateral    HERNIA REPAIR     KYPHOPLASTY N/A 12/13/2018   Procedure: KYPHOPLASTY- T7;  Surgeon: Kennedy Bucker, MD;  Location: [facility] ORS;  Service: Orthopedics;  Laterality: N/A;   LEFT HEART CATH N/A 05/11/2022   Procedure: Left Heart Cath;  Surgeon: Marcina Millard, MD;  Location: [facility] INVASIVE CV LAB;  Service: Cardiovascular;  Laterality: N/A;   LEFT HEART CATH AND CORONARY ANGIOGRAPHY Left 05/19/2020   Procedure: LEFT HEART CATH AND CORONARY ANGIOGRAPHY;  Surgeon: Marcina Millard, MD;  Location: [facility] INVASIVE CV LAB;  Service: Cardiovascular;  Laterality: Left;   LUNG LOBECTOMY Right    upper lung due to asbestos exposure   XI ROBOTIC ASSISTED INGUINAL HERNIA REPAIR WITH MESH Right 07/19/2019   Procedure: XI ROBOTIC ASSISTED INGUINAL HERNIA REPAIR WITH MESH;  Surgeon: Sung Amabile, DO;  Location: [facility] ORS;  Service: General;  Laterality: Right;    Family History No h/o GI disease or malignancy  Review of Systems  Constitutional:  Negative for activity change, appetite change, chills, diaphoresis, fatigue, fever and unexpected weight change.  HENT:  Negative for trouble swallowing  and voice change.   Respiratory:  Negative for shortness of breath and wheezing.   Cardiovascular:  Negative for chest pain, palpitations and leg swelling.  Gastrointestinal:  Negative for abdominal distention, abdominal pain, anal bleeding, blood in stool, constipation, diarrhea, nausea and vomiting.  Musculoskeletal:  Negative for arthralgias and myalgias.  Skin:  Negative for color change and pallor.   Neurological:  Negative for dizziness, syncope and weakness.  Psychiatric/Behavioral:  Negative for confusion. The patient is not nervous/anxious.   All other systems reviewed and are negative.    Medications No current facility-administered medications on file prior to encounter.   Current Outpatient Medications on File Prior to Encounter  Medication Sig Dispense Refill   aspirin EC 81 MG tablet Take 81 mg by mouth daily. Swallow whole.     atorvastatin (LIPITOR) 80 MG tablet Take 80 mg by mouth daily. (Patient not taking: Reported on 03/14/2023)     chlorpheniramine-HYDROcodone (TUSSIONEX) 10-8 MG/5ML Take 5 mLs by mouth at bedtime as needed. (Patient not taking: Reported on 03/14/2023)     clopidogrel (PLAVIX) 75 MG tablet Take 1 tablet (75 mg total) by mouth daily. (Patient not taking: Reported on 03/14/2023) 30 tablet 0   gabapentin (NEURONTIN) 800 MG tablet Take 800 mg by mouth 2 (two) times daily.      ibuprofen (ADVIL) 800 MG tablet Take 800 mg by mouth every 8 (eight) hours as needed for mild pain.     ipratropium-albuterol (DUONEB) 0.5-2.5 (3) MG/3ML SOLN Inhale 3 mLs into the lungs every 4 (four) hours as needed (When he get sick and the doctor tells him to take it).     isosorbide mononitrate (IMDUR) 30 MG 24 hr tablet Take 1 tablet (30 mg total) by mouth daily. 30 tablet 0   metoprolol succinate (TOPROL-XL) 25 MG 24 hr tablet Take 1 tablet (25 mg total) by mouth daily. (Patient not taking: Reported on 04/20/2023) 30 tablet 0   nicotine polacrilex (NICORETTE) 2 MG gum Take 1 each (2 mg total) by mouth as needed for smoking cessation. (Patient not taking: Reported on 03/14/2023) 100 tablet 0   sodium chloride HYPERTONIC 3 % nebulizer solution Take by nebulization in the morning and at bedtime. Use twice daily, followed by albuterol, followed by flutter device (Patient not taking: Reported on 04/20/2023) 750 mL 12   tamsulosin (FLOMAX) 0.4 MG CAPS capsule Take 0.4 mg by mouth at bedtime.      TRELEGY ELLIPTA 100-62.5-25 MCG/INH AEPB Inhale 1 puff into the lungs daily.      Pertinent medications related to GI and procedure were reviewed by me with the patient prior to the procedure  No current facility-administered medications for this encounter.  Current Outpatient Medications:    aspirin EC 81 MG tablet, Take 81 mg by mouth daily. Swallow whole., Disp: , Rfl:    atorvastatin (LIPITOR) 80 MG tablet, Take 80 mg by mouth daily. (Patient not taking: Reported on 03/14/2023), Disp: , Rfl:    chlorpheniramine-HYDROcodone (TUSSIONEX) 10-8 MG/5ML, Take 5 mLs by mouth at bedtime as needed. (Patient not taking: Reported on 03/14/2023), Disp: , Rfl:    clopidogrel (PLAVIX) 75 MG tablet, Take 1 tablet (75 mg total) by mouth daily. (Patient not taking: Reported on 03/14/2023), Disp: 30 tablet, Rfl: 0   gabapentin (NEURONTIN) 800 MG tablet, Take 800 mg by mouth 2 (two) times daily. , Disp: , Rfl:    ibuprofen (ADVIL) 800 MG tablet, Take 800 mg by mouth every 8 (eight) hours as needed  for mild pain., Disp: , Rfl:    ipratropium-albuterol (DUONEB) 0.5-2.5 (3) MG/3ML SOLN, Inhale 3 mLs into the lungs every 4 (four) hours as needed (When he get sick and the doctor tells him to take it)., Disp: , Rfl:    isosorbide mononitrate (IMDUR) 30 MG 24 hr tablet, Take 1 tablet (30 mg total) by mouth daily., Disp: 30 tablet, Rfl: 0   levofloxacin (LEVAQUIN) 750 MG tablet, Take 1 tablet (750 mg total) by mouth daily., Disp: 5 tablet, Rfl: 0   metoprolol succinate (TOPROL-XL) 25 MG 24 hr tablet, Take 1 tablet (25 mg total) by mouth daily. (Patient not taking: Reported on 04/20/2023), Disp: 30 tablet, Rfl: 0   nicotine polacrilex (NICORETTE) 2 MG gum, Take 1 each (2 mg total) by mouth as needed for smoking cessation. (Patient not taking: Reported on 03/14/2023), Disp: 100 tablet, Rfl: 0   omeprazole (PRILOSEC) 40 MG capsule, Take 40 mg by mouth daily. (Patient not taking: Reported on 04/20/2023), Disp: , Rfl:    sodium  chloride HYPERTONIC 3 % nebulizer solution, Take by nebulization in the morning and at bedtime. Use twice daily, followed by albuterol, followed by flutter device (Patient not taking: Reported on 04/20/2023), Disp: 750 mL, Rfl: 12   tamsulosin (FLOMAX) 0.4 MG CAPS capsule, Take 0.4 mg by mouth at bedtime., Disp: , Rfl:    TRELEGY ELLIPTA 100-62.5-25 MCG/INH AEPB, Inhale 1 puff into the lungs daily., Disp: , Rfl:       Allergies  Allergen Reactions   Codeine Nausea And Vomiting and Other (See Comments)   Allergies were reviewed by me prior to the procedure  Objective   There is no height or weight on file to calculate BMI. There were no vitals filed for this visit.  *** Physical Exam Vitals and nursing note reviewed.  Constitutional:      General: He is not in acute distress.    Appearance: Normal appearance. He is not ill-appearing, toxic-appearing or diaphoretic.  HENT:     Head: Normocephalic and atraumatic.     Nose: Nose normal.     Mouth/Throat:     Mouth: Mucous membranes are moist.     Pharynx: Oropharynx is clear.  Eyes:     General: No scleral icterus.    Extraocular Movements: Extraocular movements intact.  Cardiovascular:     Rate and Rhythm: Normal rate and regular rhythm.     Heart sounds: Normal heart sounds. No murmur heard.    No friction rub. No gallop.  Pulmonary:     Effort: No respiratory distress.     Breath sounds: Normal breath sounds. No wheezing, rhonchi or rales.  Abdominal:     General: Bowel sounds are normal. There is no distension.     Palpations: Abdomen is soft.     Tenderness: There is no abdominal tenderness. There is no guarding or rebound.  Musculoskeletal:     Cervical back: Neck supple.     Right lower leg: No edema.     Left lower leg: No edema.  Skin:    General: Skin is warm and dry.     Coloration: Skin is not jaundiced or pale.  Neurological:     General: No focal deficit present.     Mental Status: He is alert and oriented  to person, place, and time. Mental status is at baseline.  Psychiatric:        Mood and Affect: Mood normal.        Behavior: Behavior normal.  Thought Content: Thought content normal.        Judgment: Judgment normal.      Assessment:  [name] is a 77 y.o. male  who presents today for Colonoscopy for FIT positive, personal history of colon polyps .  Plan:  Colonoscopy with possible intervention today  Colonoscopy with possible biopsy, control of bleeding, polypectomy, and interventions as necessary has been discussed with the patient/patient representative. Informed consent was obtained from the patient/patient representative after explaining the indication, nature, and risks of the procedure including but not limited to death, bleeding, perforation, missed neoplasm/lesions, cardiorespiratory compromise, and reaction to medications. Opportunity for questions was given and appropriate answers were provided. Patient/patient representative has verbalized understanding is amenable to undergoing the procedure.   Jaynie Collins, DO  [facility] Gastroenterology  Portions of the record may have been created with voice recognition software. Occasional wrong-word or 'sound-a-like' substitutions may have occurred due to the inherent limitations of voice recognition software.  Read the chart carefully and recognize, using context, where substitutions may have occurred.

## 2023-07-13 ENCOUNTER — Other Ambulatory Visit: Payer: Self-pay

## 2023-07-13 ENCOUNTER — Ambulatory Visit
Admission: RE | Admit: 2023-07-13 | Discharge: 2023-07-13 | Disposition: A | Discharge status: Home / Self Care | Payer: Medicare HMO | Attending: Gastroenterology | Admitting: Gastroenterology

## 2023-07-13 ENCOUNTER — Ambulatory Visit: Payer: Medicare HMO | Admitting: Certified Registered Nurse Anesthetist

## 2023-07-13 ENCOUNTER — Encounter: Payer: Self-pay | Admitting: Gastroenterology

## 2023-07-13 ENCOUNTER — Encounter
Admission: RE | Disposition: A | Discharge status: Home / Self Care | Payer: Self-pay | Source: Home / Self Care | Attending: Gastroenterology

## 2023-07-13 DIAGNOSIS — K5730 Diverticulosis of large intestine without perforation or abscess without bleeding: Secondary | ICD-10-CM | POA: Insufficient documentation

## 2023-07-13 DIAGNOSIS — K635 Polyp of colon: Secondary | ICD-10-CM | POA: Diagnosis not present

## 2023-07-13 DIAGNOSIS — Z1211 Encounter for screening for malignant neoplasm of colon: Secondary | ICD-10-CM | POA: Insufficient documentation

## 2023-07-13 DIAGNOSIS — I252 Old myocardial infarction: Secondary | ICD-10-CM | POA: Diagnosis not present

## 2023-07-13 DIAGNOSIS — Z79899 Other long term (current) drug therapy: Secondary | ICD-10-CM | POA: Diagnosis not present

## 2023-07-13 DIAGNOSIS — K640 First degree hemorrhoids: Secondary | ICD-10-CM | POA: Insufficient documentation

## 2023-07-13 DIAGNOSIS — K621 Rectal polyp: Secondary | ICD-10-CM | POA: Diagnosis not present

## 2023-07-13 DIAGNOSIS — F17210 Nicotine dependence, cigarettes, uncomplicated: Secondary | ICD-10-CM | POA: Diagnosis not present

## 2023-07-13 DIAGNOSIS — J449 Chronic obstructive pulmonary disease, unspecified: Secondary | ICD-10-CM | POA: Insufficient documentation

## 2023-07-13 DIAGNOSIS — R195 Other fecal abnormalities: Secondary | ICD-10-CM | POA: Diagnosis not present

## 2023-07-13 DIAGNOSIS — K649 Unspecified hemorrhoids: Secondary | ICD-10-CM | POA: Diagnosis not present

## 2023-07-13 DIAGNOSIS — I10 Essential (primary) hypertension: Secondary | ICD-10-CM | POA: Insufficient documentation

## 2023-07-13 DIAGNOSIS — Z860100 Personal history of colon polyps, unspecified: Secondary | ICD-10-CM | POA: Diagnosis not present

## 2023-07-13 DIAGNOSIS — F17200 Nicotine dependence, unspecified, uncomplicated: Secondary | ICD-10-CM | POA: Insufficient documentation

## 2023-07-13 HISTORY — PX: COLONOSCOPY WITH PROPOFOL: SHX5780

## 2023-07-13 HISTORY — PX: BIOPSY: SHX5522

## 2023-07-13 SURGERY — COLONOSCOPY WITH PROPOFOL
Anesthesia: General

## 2023-07-13 MED ORDER — PROPOFOL 1000 MG/100ML IV EMUL
INTRAVENOUS | Status: AC
  Filled 2023-07-13: qty 100

## 2023-07-13 MED ORDER — PROPOFOL 10 MG/ML IV BOLUS
INTRAVENOUS | Status: DC | PRN
  Administered 2023-07-13: 150 ug/kg/min via INTRAVENOUS
  Administered 2023-07-13: 80 mg via INTRAVENOUS

## 2023-07-13 MED ORDER — GLYCOPYRROLATE 0.2 MG/ML IJ SOLN
INTRAMUSCULAR | Status: AC
  Filled 2023-07-13: qty 1

## 2023-07-13 MED ORDER — PHENYLEPHRINE 80 MCG/ML (10ML) SYRINGE FOR IV PUSH (FOR BLOOD PRESSURE SUPPORT)
PREFILLED_SYRINGE | INTRAVENOUS | Status: DC | PRN
  Administered 2023-07-13: 160 ug via INTRAVENOUS

## 2023-07-13 MED ORDER — SODIUM CHLORIDE 0.9 % IV SOLN: INTRAVENOUS | Status: DC

## 2023-07-13 MED ORDER — LIDOCAINE HCL (PF) 2 % IJ SOLN
INTRAMUSCULAR | Status: AC
  Filled 2023-07-13: qty 5

## 2023-07-13 MED ORDER — LIDOCAINE HCL (CARDIAC) PF 100 MG/5ML IV SOSY
PREFILLED_SYRINGE | INTRAVENOUS | Status: DC | PRN
  Administered 2023-07-13: 50 mg via INTRAVENOUS

## 2023-07-13 NOTE — Anesthesia Postprocedure Evaluation (Signed)
Anesthesia Post Note  Patient: Stephen Leon  Procedure(s) Performed: COLONOSCOPY WITH PROPOFOL BIOPSY  Patient location during evaluation: PACU Anesthesia Type: General Level of consciousness: awake and alert Pain management: pain level controlled Vital Signs Assessment: post-procedure vital signs reviewed and stable Respiratory status: spontaneous breathing, nonlabored ventilation, respiratory function stable and patient connected to nasal cannula oxygen Cardiovascular status: blood pressure returned to baseline and stable Postop Assessment: no apparent nausea or vomiting Anesthetic complications: no  No notable events documented.   Last Vitals:  Vitals:   07/13/23 0805 07/13/23 0815  BP: 108/69 116/71  Pulse: 74 70  Resp:  (!) 21  Temp: (!) 36.1 C   SpO2: 94% 96%    Last Pain:  Vitals:   07/13/23 0815  TempSrc:   PainSc: 0-No pain                 Stephanie Coup

## 2023-07-13 NOTE — Anesthesia Procedure Notes (Signed)
Date/Time: 07/13/2023 7:45 AM  Performed by: Ginger Carne, CRNAPre-anesthesia Checklist: Patient identified, Emergency Drugs available, Suction available, Patient being monitored and Timeout performed Patient Re-evaluated:Patient Re-evaluated prior to induction Oxygen Delivery Method: Nasal cannula Preoxygenation: Pre-oxygenation with 100% oxygen Induction Type: IV induction

## 2023-07-13 NOTE — Op Note (Signed)
[facility] Gastroenterology Patient Name: Stephen Leon Procedure Date: 07/13/2023 7:34 AM MRN: [id number] Account #: [id number] Date of Birth: June 10, 1946 Admit Type: Outpatient Age: 77 Room: [facility] ENDO ROOM 1 Gender: Male Note Status: Finalized Instrument Name: Peds Colonoscope [id number] Procedure:             Colonoscopy Indications:           High risk colon cancer surveillance: Personal history                         of colonic polyps Providers:             Jaynie Collins DO, DO Medicines:             Monitored Anesthesia Care Complications:         No immediate complications. Estimated blood loss:                         Minimal. Procedure:             Pre-Anesthesia Assessment:                        - Prior to the procedure, a History and Physical was                         performed, and patient medications and allergies were                         reviewed. The patient is competent. The risks and                         benefits of the procedure and the sedation options and                         risks were discussed with the patient. All questions                         were answered and informed consent was obtained.                         Patient identification and proposed procedure were                         verified by the physician, the nurse, the anesthetist                         and the technician in the endoscopy suite. Mental                         Status Examination: alert and oriented. Airway                         Examination: normal oropharyngeal airway and neck                         mobility. Respiratory Examination: clear to                         auscultation. CV Examination: RRR, no murmurs, no S3  or S4. Prophylactic Antibiotics: The patient does not                         require prophylactic antibiotics. Prior                         Anticoagulants: The patient has taken no  anticoagulant                         or antiplatelet agents. ASA Grade Assessment: III - A                         patient with severe systemic disease. After reviewing                         the risks and benefits, the patient was deemed in                         satisfactory condition to undergo the procedure. The                         anesthesia plan was to use monitored anesthesia care                         (MAC). Immediately prior to administration of                         medications, the patient was re-assessed for adequacy                         to receive sedatives. The heart rate, respiratory                         rate, oxygen saturations, blood pressure, adequacy of                         pulmonary ventilation, and response to care were                         monitored throughout the procedure. The physical                         status of the patient was re-assessed after the                         procedure.                        After obtaining informed consent, the colonoscope was                         passed under direct vision. Throughout the procedure,                         the patient's blood pressure, pulse, and oxygen                         saturations were monitored continuously. The  Colonoscope was introduced through the anus and                         advanced to the the terminal ileum, with                         identification of the appendiceal orifice and IC                         valve. The colonoscopy was somewhat difficult due to                         frequent colon spasm and coughing. The patient                         tolerated the procedure well. The quality of the bowel                         preparation was evaluated using the BBPS [facility] Bowel                         Preparation Scale) with scores of: Right Colon = 2                         (minor amount of residual staining, small fragments of                          stool and/or opaque liquid, but mucosa seen well),                         Transverse Colon = 2 (minor amount of residual                         staining, small fragments of stool and/or opaque                         liquid, but mucosa seen well) and Left Colon = 2                         (minor amount of residual staining, small fragments of                         stool and/or opaque liquid, but mucosa seen well). The                         total BBPS score equals 6. The quality of the bowel                         preparation was good. The terminal ileum, ileocecal                         valve, appendiceal orifice, and rectum were                         photographed. Findings:      The perianal and digital rectal examinations were normal. Pertinent       negatives include  normal sphincter tone.      The terminal ileum appeared normal. Estimated blood loss: none.      Retroflexion in the right colon was performed.      Multiple small-mouthed diverticula were found in the recto-sigmoid colon       and sigmoid colon. Estimated blood loss: none.      Non-bleeding internal hemorrhoids were found during retroflexion. The       hemorrhoids were Grade I (internal hemorrhoids that do not prolapse).       Estimated blood loss: none.      Two sessile polyps were found in the rectum and ascending colon. The       polyps were 1 to 2 mm in size. These polyps were removed with a jumbo       cold forceps. Resection and retrieval were complete. Estimated blood       loss was minimal.      The exam was otherwise without abnormality on direct and retroflexion       views. Impression:            - The examined portion of the ileum was normal.                        - Diverticulosis in the recto-sigmoid colon and in the                         sigmoid colon.                        - Non-bleeding internal hemorrhoids.                        - Two 1 to 2 mm polyps in the rectum and  in the                         ascending colon, removed with a jumbo cold forceps.                         Resected and retrieved.                        - The examination was otherwise normal on direct and                         retroflexion views. Recommendation:        - Patient has a contact number available for                         emergencies. The signs and symptoms of potential                         delayed complications were discussed with the patient.                         Return to normal activities tomorrow. Written                         discharge instructions were provided to the patient.                        -  Discharge patient to home.                        - Resume previous diet.                        - Continue present medications.                        - Await pathology results.                        - Repeat colonoscopy in 3 years for surveillance based                         on pathology results.                        - Return to referring physician as previously                         scheduled.                        - The findings and recommendations were discussed with                         the patient. Procedure Code(s):     --- Professional ---                        [phone], Colonoscopy, flexible; with biopsy, single or                         multiple Diagnosis Code(s):     --- Professional ---                        Z86.010, Personal history of colonic polyps                        K64.0, First degree hemorrhoids                        D12.8, Benign neoplasm of rectum                        D12.2, Benign neoplasm of ascending colon                        K57.30, Diverticulosis of large intestine without                         perforation or abscess without bleeding CPT copyright 2022 American Medical Association. All rights reserved. The codes documented in this report are preliminary and upon coder review may  be revised to meet current  compliance requirements. Attending Participation:      I personally performed the entire procedure. Elfredia Nevins, DO Jaynie Collins DO, DO 07/13/2023 8:06:21 AM This report has been signed electronically. Number of Addenda: 0 Note Initiated On: 07/13/2023 7:34 AM Scope Withdrawal Time: 0 hours 17 minutes 30 seconds  Total Procedure Duration: 0 hours 21 minutes 57 seconds  Estimated Blood Loss:  Estimated blood loss was minimal.  [facility]

## 2023-07-13 NOTE — Transfer of Care (Signed)
Immediate Anesthesia Transfer of Care Note  Patient: Stephen Leon  Procedure(s) Performed: COLONOSCOPY WITH PROPOFOL BIOPSY  Patient Location: Endoscopy Unit  Anesthesia Type:General  Level of Consciousness: awake, alert , and oriented  Airway & Oxygen Therapy: Patient Spontanous Breathing  Post-op Assessment: Report given to RN and Post -op Vital signs reviewed and stable  Post vital signs: Reviewed and stable  Last Vitals:  Vitals Value Taken Time  BP 108/69 07/13/23 0805  Temp 36.1 C 07/13/23 0805  Pulse 76 07/13/23 0806  Resp 21 07/13/23 0806  SpO2 94 % 07/13/23 0806  Vitals shown include unfiled device data.  Last Pain:  Vitals:   07/13/23 0805  TempSrc: Temporal         Complications: No notable events documented.

## 2023-07-13 NOTE — Anesthesia Preprocedure Evaluation (Signed)
Anesthesia Evaluation  Patient identified by MRN, date of birth, ID band Patient awake    Reviewed: Allergy & Precautions, NPO status , Patient's Chart, lab work & pertinent test results  History of Anesthesia Complications Negative for: history of anesthetic complications  Airway Mallampati: II  TM Distance: >3 FB Neck ROM: Full    Dental  (+) Poor Dentition, Dental Advidsory Given   Pulmonary neg shortness of breath, neg sleep apnea, COPD,  COPD inhaler, Current Smoker and Patient abstained from smoking.   breath sounds clear to auscultation- rhonchi (-) wheezing      Cardiovascular hypertension, Pt. on medications + Past MI  (-) CAD, (-) Cardiac Stents and (-) CABG  Rhythm:Regular Rate:Normal - Systolic murmurs and - Diastolic murmurs    Neuro/Psych neg Seizures PSYCHIATRIC DISORDERS Anxiety     negative neurological ROS     GI/Hepatic negative GI ROS, Neg liver ROS,,,  Endo/Other  negative endocrine ROSneg diabetes    Renal/GU negative Renal ROS     Musculoskeletal negative musculoskeletal ROS (+)    Abdominal  (+) - obese  Peds  Hematology negative hematology ROS (+)   Anesthesia Other Findings Past Medical History: No date: Anxiety No date: Bronchitis 2016: Cancer ([place]) No date: COPD (chronic obstructive pulmonary disease) ([place]) 07/13/2007: Cough     Comment:  Qualifier: Diagnosis of  By: Wynona Luna  No date: Dyspnea     Comment:  due to copd No date: Hypertension     Comment:  pt denies this and is not on bp meds but vascular note               states htn 07/13/2007: LUNG NODULE     Comment:  Qualifier: Diagnosis of  By: Wynona Luna  2013: Pneumonia No date: Thoracic aortic aneurysm ([place])     Comment:  followed by vascular   Reproductive/Obstetrics                             Anesthesia Physical Anesthesia Plan  ASA: 3  Anesthesia Plan: General   Post-op  Pain Management: Minimal or no pain anticipated   Induction: Intravenous  PONV Risk Score and Plan: 0 and Propofol infusion and TIVA  Airway Management Planned: Nasal Cannula and Natural Airway  Additional Equipment: None  Intra-op Plan:   Post-operative Plan: Extubation in OR  Informed Consent: I have reviewed the patients History and Physical, chart, labs and discussed the procedure including the risks, benefits and alternatives for the proposed anesthesia with the patient or authorized representative who has indicated his/her understanding and acceptance.     Dental advisory given  Plan Discussed with: CRNA and Anesthesiologist  Anesthesia Plan Comments: (Discussed risks of anesthesia with patient, including possibility of difficulty with spontaneous ventilation under anesthesia necessitating airway intervention, PONV, and rare risks such as cardiac or respiratory or neurological events, and allergic reactions. Discussed the role of CRNA in patient's perioperative care. Patient understands.)        Anesthesia Quick Evaluation

## 2023-07-13 NOTE — Interval H&P Note (Signed)
History and Physical Interval Note: Preprocedure H&P from 07/13/23  was reviewed and there was no interval change after seeing and examining the patient.  Written consent was obtained from the patient after discussion of risks, benefits, and alternatives. Patient has consented to proceed with Colonoscopy with possible intervention   07/13/2023 7:28 AM  Stephen Leon  has presented today for surgery, with the diagnosis of V12.72 (ICD-9-CM) - Z86.010 (ICD-10-CM) - Personal history of colonic polyps.  The various methods of treatment have been discussed with the patient and family. After consideration of risks, benefits and other options for treatment, the patient has consented to  Procedure(s): COLONOSCOPY WITH PROPOFOL (N/A) as a surgical intervention.  The patient's history has been reviewed, patient examined, no change in status, stable for surgery.  I have reviewed the patient's chart and labs.  Questions were answered to the patient's satisfaction.     Jaynie Collins

## 2023-07-14 ENCOUNTER — Encounter: Payer: Self-pay | Admitting: Gastroenterology

## 2023-07-14 LAB — SURGICAL PATHOLOGY

## 2023-08-19 ENCOUNTER — Encounter: Payer: Self-pay | Admitting: Physician Assistant

## 2023-08-19 ENCOUNTER — Ambulatory Visit (INDEPENDENT_AMBULATORY_CARE_PROVIDER_SITE_OTHER): Payer: Medicare HMO

## 2023-08-19 ENCOUNTER — Ambulatory Visit
Admission: EM | Admit: 2023-08-19 | Discharge: 2023-08-19 | Disposition: A | Discharge status: Home / Self Care | Payer: Medicare HMO | Attending: Physician Assistant | Admitting: Physician Assistant

## 2023-08-19 ENCOUNTER — Other Ambulatory Visit: Payer: Self-pay

## 2023-08-19 DIAGNOSIS — M545 Low back pain, unspecified: Secondary | ICD-10-CM

## 2023-08-19 DIAGNOSIS — S32030A Wedge compression fracture of third lumbar vertebra, initial encounter for closed fracture: Secondary | ICD-10-CM

## 2023-08-19 DIAGNOSIS — M4856XA Collapsed vertebra, not elsewhere classified, lumbar region, initial encounter for fracture: Secondary | ICD-10-CM | POA: Diagnosis not present

## 2023-08-19 DIAGNOSIS — M8580 Other specified disorders of bone density and structure, unspecified site: Secondary | ICD-10-CM | POA: Diagnosis not present

## 2023-08-19 DIAGNOSIS — I700 Atherosclerosis of aorta: Secondary | ICD-10-CM | POA: Diagnosis not present

## 2023-08-19 MED ORDER — HYDROCODONE-ACETAMINOPHEN 5-325 MG PO TABS: 0.5000 | ORAL_TABLET | Freq: Two times a day (BID) | ORAL | 0 refills | Status: AC | PRN

## 2023-08-19 NOTE — ED Provider Notes (Signed)
 [facility]    CSN: [id number] Arrival date & time: 08/19/23  1020      History   Chief Complaint Chief Complaint  Patient presents with   Back Pain    HPI Stephen Leon is a 78 y.o. male.   Patient presents today with a 1 day history of lower back pain.  He reports that yesterday he went to lift the concrete lid off of his well when he felt his lower back give out which caused him to fall.  He then had to crawl to another surface so that he could stand up.  He has been taking ibuprofen  and gabapentin  with minimal improvement of symptoms.  He reports pain is a dull ache and minimal when he is sitting but significantly worsens when he changes position from sitting to standing.  Denies any bowel/bladder incontinence, lower extremity weakness, saddle anesthesia.  Pain is rated 8 on a 0-10 pain scale, described as pressure with periodic sharp pains, no alleviating factors identified.  He does report having compression fracture at T7 with kyphoplasty several years ago but denies additional spinal surgery.      Past Medical History:  Diagnosis Date   Anxiety    Bronchitis    Cancer (HCC) 2016   COPD (chronic obstructive pulmonary disease) (HCC)    Cough 07/13/2007   Qualifier: Diagnosis of  By: Georgian ROSALEA CHARM Lamar    Dyspnea    due to copd   Hypertension    pt denies this and is not on bp meds but vascular note states htn   LUNG NODULE 07/13/2007   Qualifier: Diagnosis of  By: Georgian ROSALEA CHARM Lamar    Pneumonia 2013   Thoracic aortic aneurysm [facility])    followed by vascular    Patient Active Problem List   Diagnosis Date Noted   Chest pressure 05/10/2022   NSTEMI (non-ST elevated myocardial infarction) (HCC) 05/10/2022   Aneurysm of thoracic aorta (HCC) 09/23/2017   Chronic pain syndrome 07/11/2016   History of narcotic addiction (HCC) 07/11/2016   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) (T10 & T11 07/11/2016    GAD (generalized anxiety disorder) 12/15/2015   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   COPD (chronic obstructive pulmonary disease) (HCC) 09/28/2015   Malignant neoplastic disease (HCC) 09/28/2015   Acute bronchitis with bronchospasm 08/28/2015   Essential (primary) hypertension 06/16/2015   Peripheral neuropathic pain 01/14/2015   Cancer of lung (HCC) 05/16/2012   Tobacco user 05/04/2012   Non-small cell carcinoma of lung (HCC) 05/04/2012    Past Surgical History:  Procedure Laterality Date   BIOPSY  07/13/2023   Procedure: BIOPSY;  Surgeon: Onita Elspeth Sharper, DO;  Location: [facility] ENDOSCOPY;  Service: Gastroenterology;;   COLONOSCOPY N/A 10/27/2022   Procedure: COLONOSCOPY;  Surgeon: Onita Elspeth Sharper, DO;  Location: [facility] ENDOSCOPY;  Service: Gastroenterology;  Laterality: N/A;   COLONOSCOPY WITH PROPOFOL  N/A 07/13/2023   Procedure: COLONOSCOPY WITH PROPOFOL ;  Surgeon: Onita Elspeth Sharper, DO;  Location: [facility] ENDOSCOPY;  Service: Gastroenterology;  Laterality: N/A;   CORONARY PRESSURE/FFR STUDY N/A 05/19/2020   Procedure: INTRAVASCULAR PRESSURE WIRE/FFR STUDY;  Surgeon: Ammon Blunt, MD;  Location: [facility] INVASIVE CV LAB;  Service: Cardiovascular;  Laterality: N/A;   DIAGNOSTIC LAPAROSCOPY     ESOPHAGOGASTRODUODENOSCOPY N/A 10/27/2022   Procedure: ESOPHAGOGASTRODUODENOSCOPY (EGD);  Surgeon: Onita Elspeth Sharper, DO;  Location: [facility] ENDOSCOPY;  Service: Gastroenterology;  Laterality: N/A;   EYE SURGERY Bilateral  HERNIA REPAIR     KYPHOPLASTY N/A 12/13/2018   Procedure: KYPHOPLASTY- T7;  Surgeon: Kathlynn Sharper, MD;  Location: [facility] ORS;  Service: Orthopedics;  Laterality: N/A;   LEFT HEART CATH N/A 05/11/2022   Procedure: Left Heart Cath;  Surgeon: Ammon Blunt, MD;  Location: [facility] INVASIVE CV LAB;  Service: Cardiovascular;  Laterality: N/A;   LEFT HEART CATH AND CORONARY ANGIOGRAPHY Left 05/19/2020   Procedure: LEFT HEART CATH AND CORONARY  ANGIOGRAPHY;  Surgeon: Ammon Blunt, MD;  Location: [facility] INVASIVE CV LAB;  Service: Cardiovascular;  Laterality: Left;   LUNG LOBECTOMY Right    upper lung due to asbestos exposure   XI ROBOTIC ASSISTED INGUINAL HERNIA REPAIR WITH MESH Right 07/19/2019   Procedure: XI ROBOTIC ASSISTED INGUINAL HERNIA REPAIR WITH MESH;  Surgeon: Tye Millet, DO;  Location: [facility] ORS;  Service: General;  Laterality: Right;       Home Medications    Prior to Admission medications   Medication Sig Start Date End Date Taking? Authorizing Provider  aspirin  EC 81 MG tablet Take 81 mg by mouth daily. Swallow whole.   Yes [provider]  gabapentin  (NEURONTIN ) 800 MG tablet Take 800 mg by mouth 2 (two) times daily.  03/09/20  Yes [provider]  HYDROcodone -acetaminophen  (NORCO/VICODIN) 5-325 MG tablet Take 0.5-1 tablets by mouth 2 (two) times daily as needed for up to 3 days. 08/19/23 08/22/23 Yes Khristy Kalan K, PA-C  ibuprofen  (ADVIL ) 800 MG tablet Take 800 mg by mouth every 8 (eight) hours as needed for mild pain.   Yes [provider]  tamsulosin (FLOMAX) 0.4 MG CAPS capsule Take 0.4 mg by mouth at bedtime. 03/24/20  Yes [provider]  atorvastatin  (LIPITOR ) 80 MG tablet Take 80 mg by mouth daily. Patient not taking: Reported on 03/14/2023    [provider]  chlorpheniramine-HYDROcodone  (TUSSIONEX) 10-8 MG/5ML Take 5 mLs by mouth at bedtime as needed. Patient not taking: Reported on 03/14/2023 04/06/22   [provider]  clopidogrel  (PLAVIX ) 75 MG tablet Take 1 tablet (75 mg total) by mouth daily. Patient not taking: Reported on 03/14/2023 05/12/22   Alexander, Natalie, DO  ipratropium-albuterol  (DUONEB) 0.5-2.5 (3) MG/3ML SOLN Inhale 3 mLs into the lungs every 4 (four) hours as needed (When he get sick and the doctor tells him to take it). 06/29/18   [provider]  isosorbide  mononitrate (IMDUR ) 30 MG 24 hr tablet Take 1 tablet (30 mg total) by  mouth daily. 05/12/22   Alexander, Natalie, DO  levofloxacin  (LEVAQUIN ) 750 MG tablet Take 1 tablet (750 mg total) by mouth daily. Patient not taking: Reported on 08/19/2023 04/20/23   Isadora Hose, MD  metoprolol  succinate (TOPROL -XL) 25 MG 24 hr tablet Take 1 tablet (25 mg total) by mouth daily. Patient not taking: Reported on 04/20/2023 05/12/22   Alexander, Natalie, DO  nicotine  polacrilex (NICORETTE ) 2 MG gum Take 1 each (2 mg total) by mouth as needed for smoking cessation. Patient not taking: Reported on 03/14/2023 05/12/22   Alexander, Natalie, DO  omeprazole (PRILOSEC) 40 MG capsule Take 40 mg by mouth daily. Patient not taking: Reported on 04/20/2023    [provider]  sodium chloride  HYPERTONIC 3 % nebulizer solution Take by nebulization in the morning and at bedtime. Use twice daily, followed by albuterol , followed by flutter device Patient not taking: Reported on 04/20/2023 12/01/22 12/01/23  Isadora Hose, MD  TRELEGY ELLIPTA 100-62.5-25 MCG/INH AEPB Inhale 1 puff into the lungs daily. Patient not taking: Reported on  08/19/2023 03/16/20   [provider]    Family History Family History  Problem Relation Age of Onset   COPD Mother     Social History Social History   Tobacco Use   Smoking status: Every Day    Current packs/day: 0.50    Average packs/day: 0.5 packs/day for 60.0 years (30.0 ttl pk-yrs)    Types: Cigarettes   Smokeless tobacco: Never   Tobacco comments:    5-10cigarettes daily-03/14/2023  Vaping Use   Vaping status: Never Used  Substance Use Topics   Alcohol  use: Yes    Alcohol /week: 12.0 standard drinks of alcohol     Types: 12 Standard drinks or equivalent per week    Comment: Drinks 3-4 beers every night   Drug use: No     Allergies   Codeine   Review of Systems Review of Systems  Constitutional:  Positive for activity change. Negative for appetite change, fatigue and fever.  Respiratory:  Positive for cough (chronic). Negative for  shortness of breath.   Cardiovascular:  Negative for chest pain.  Gastrointestinal:  Negative for abdominal pain, diarrhea, nausea and vomiting.  Musculoskeletal:  Positive for back pain. Negative for arthralgias and myalgias.  Neurological:  Negative for weakness and numbness.     Physical Exam Triage Vital Signs ED Triage Vitals  Encounter Vitals Group     BP 08/19/23 1109 134/70     Systolic BP Percentile --      Diastolic BP Percentile --      Pulse Rate 08/19/23 1109 70     Resp 08/19/23 1109 20     Temp 08/19/23 1109 98 F (36.7 C)     Temp Source 08/19/23 1109 Oral     SpO2 08/19/23 1109 91 %     Weight --      Height --      Head Circumference --      Peak Flow --      Pain Score 08/19/23 1106 8     Pain Loc --      Pain Education --      Exclude from Growth Chart --    No data found.  Updated Vital Signs BP 134/70 (BP Location: Left Arm)   Pulse 68   Temp 98 F (36.7 C) (Oral)   Resp 20   SpO2 96%   Visual Acuity Right Eye Distance:   Left Eye Distance:   Bilateral Distance:    Right Eye Near:   Left Eye Near:    Bilateral Near:     Physical Exam Vitals reviewed.  Constitutional:      General: He is awake.     Appearance: Normal appearance. He is well-developed. He is not ill-appearing.     Comments: Very pleasant male appears stated age in no acute distress sitting comfortably exam room  HENT:     Head: Normocephalic and atraumatic.     Mouth/Throat:     Pharynx: Uvula midline. No oropharyngeal exudate or posterior oropharyngeal erythema.  Cardiovascular:     Rate and Rhythm: Normal rate and regular rhythm.     Heart sounds: Normal heart sounds, S1 normal and S2 normal. No murmur heard. Pulmonary:     Effort: Pulmonary effort is normal.     Breath sounds: No stridor. Decreased breath sounds present. No wheezing, rhonchi or rales.  Musculoskeletal:     Cervical back: No tenderness or bony tenderness.     Thoracic back: No tenderness or bony  tenderness.  Lumbar back: No spasms, tenderness or bony tenderness. Positive right straight leg raise test. Negative left straight leg raise test.     Comments: Back: No pain percussion of vertebrae.  No deformity or step-off noted.  Mild tenderness palpation of bilateral lumbar paraspinal muscles without spasm.  Positive straight leg raise on right.  Strength 5/5 bilateral lower extremities.  Neurological:     Mental Status: He is alert.  Psychiatric:        Behavior: Behavior is cooperative.      UC Treatments / Results  Labs (all labs ordered are listed, but only abnormal results are displayed) Labs Reviewed - No data to display  EKG   Radiology DG Lumbar Spine Complete Result Date: 08/19/2023 CLINICAL DATA:  78 year old male with pain after lifting injury. EXAM: LUMBAR SPINE - COMPLETE 4+ VIEW COMPARISON:  CT Abdomen 03/31/2014. FINDINGS: Advanced Aortoiliac calcified atherosclerosis. Tortuous abdominal aorta. Normal lumbar segmentation. L3 compression fracture with inferior endplate deformity is moderate, estimated 35% anterior loss of vertebral height there. Chronic L2 superior endplate compression fracture was present in 2015. Underlying osteopenia. Other visible vertebral height maintained. No other acute osseous abnormality identified. Relatively maintained disc spaces. Nonobstructed bowel-gas pattern. IMPRESSION: 1. Osteopenia. Moderate L3 compression fracture is new since 2015, age indeterminate but suspicious for acute fracture in this setting. If specific therapy such as vertebroplasty is desired, Lumbar MRI or Nuclear Medicine Whole-body Bone Scan would best determine acuity. 2. Chronic L2 compression fracture stable since 2015. 3. Severe Aortic Atherosclerosis (ICD10-I70.0). Electronically Signed   By: VEAR Hurst M.D.   On: 08/19/2023 12:16    Procedures Procedures (including critical care time)  Medications Ordered in UC Medications - No data to display  Initial  Impression / Assessment and Plan / UC Course  I have reviewed the triage vital signs and the nursing notes.  Pertinent labs & imaging results that were available during my care of the patient were reviewed by me and considered in my medical decision making (see chart for details).     Patient is well-appearing, afebrile, nontoxic, nontachycardic.  X-ray was obtained given mechanism of injury and severity of pain that showed likely compression fracture at L3.  Discussed that he would need more advanced imaging and encouraged him to follow-up with orthopedics.  He has had kyphoplasty in the past and so is already established with someone so recommended follow-up with the specialist but he was also given contact for another provider in the area should he had difficulty scheduling an appointment.  We did discuss that there was evidence of osteopenia and he may benefit from bone density testing in medication given the second compression fracture.  Recommend that he follow-up closely with his primary care to determine appropriate next steps.  He was given hydrocodone  to help with pain relief.  We discussed that this is sedating and addictive so he should limit use is much as possible and primarily depend on Tylenol  and ibuprofen .  He can use this for severe breakthrough pain but discussed that we are unable to provide him a refill.  Discussed that if anything worsens or changes and he has bowel/bladder incontinence, lower extremity weakness, saddle anesthesia needs to be seen immediately.  Strict return precautions given.    Final Clinical Impressions(s) / UC Diagnoses   Final diagnoses:  Acute bilateral low back pain without sciatica  Compression fracture of L3 vertebra, initial encounter [facility])     Discharge Instructions      You have a compression  fracture at L3.  Please follow-up with spine specialist (either [facility] clinic who you are already established with or the provider I gave you the  information about).  Use Tylenol  ibuprofen  for pain relief but if you have severe pain you can use hydrocodone .  As we discussed, this is addictive and sedating so try to limit use is much as possible.  We cannot provide you any refills of this medication.  The x-ray also showed that you had thin bones.  Please follow-up with your primary care to determine if we need to investigate this further or consider other medications.  If anything worsens and the pain becomes more severe, you have difficulty walking, numbness or tingling in your legs, going to the bathroom on yourself without noticing it you need to go to the emergency room immediately.     ED Prescriptions     Medication Sig Dispense Auth. Provider   HYDROcodone -acetaminophen  (NORCO/VICODIN) 5-325 MG tablet Take 0.5-1 tablets by mouth 2 (two) times daily as needed for up to 3 days. 6 tablet Arlis Everly K, PA-C      I have reviewed the PDMP during this encounter.   Sherrell Rocky POUR, PA-C 08/19/23 1257

## 2023-08-19 NOTE — ED Triage Notes (Addendum)
 Pt states he lifted the concrete top of his well yesterday and he felt his back "drop". States he then fell back onto his buttocks. Ambulated to triage with difficulty. He had 800 mg ibuprofen and gabapentin this morning

## 2023-08-19 NOTE — Discharge Instructions (Signed)
 You have a compression fracture at L3.  Please follow-up with spine specialist (either [facility] clinic who you are already established with or the provider I gave you the information about).  Use Tylenol  ibuprofen  for pain relief but if you have severe pain you can use hydrocodone .  As we discussed, this is addictive and sedating so try to limit use is much as possible.  We cannot provide you any refills of this medication.  The x-ray also showed that you had thin bones.  Please follow-up with your primary care to determine if we need to investigate this further or consider other medications.  If anything worsens and the pain becomes more severe, you have difficulty walking, numbness or tingling in your legs, going to the bathroom on yourself without noticing it you need to go to the emergency room immediately.

## 2023-08-21 ENCOUNTER — Encounter: Payer: Self-pay | Admitting: Orthopaedic Surgery

## 2023-08-21 ENCOUNTER — Ambulatory Visit (INDEPENDENT_AMBULATORY_CARE_PROVIDER_SITE_OTHER): Payer: Medicare HMO | Admitting: Orthopaedic Surgery

## 2023-08-21 VITALS — BP 134/80 | HR 68 | Ht 67.0 in | Wt 135.0 lb

## 2023-08-21 DIAGNOSIS — S32030A Wedge compression fracture of third lumbar vertebra, initial encounter for closed fracture: Secondary | ICD-10-CM | POA: Diagnosis not present

## 2023-08-21 MED ORDER — HYDROCOD POLI-CHLORPHE POLI ER 10-8 MG/5ML PO SUER: 5.0000 mL | Freq: Every evening | ORAL | 0 refills | Status: DC | PRN

## 2023-08-21 NOTE — Progress Notes (Signed)
 Office Visit Note   Patient: Stephen Leon           Date of Birth: [birth year]           MRN: [id number] Visit Date: 08/21/2023              Requested by: [name], MD [address] [facility] [place],  [place] [address] PCP: [name], MD   Assessment & Plan: Visit Diagnoses:  1. Closed compression fracture of L3 lumbar vertebra, initial encounter [facility])     Plan: Patient requests a refill of his Tussidex cough medication which was sent in.  He uses this just at night.  We discussed smoking cessation.  He has had discussion with other care providers.  Will order a bone density test recheck him in a month.  We discussed AAOS recommendations for kyphoplasty/vertebroplasty.  Repeat lumbar imaging AP and lateral on return.  He will avoid bending lifting.  Prescription given for vitamin D  400 units daily.  He will take x 1 p.o. twice daily.  Return 1 month we can review the bone density test at that time as well.  Follow-Up Instructions: Return in about 1 month (around 09/21/2023).   Orders:  No orders of the defined types were placed in this encounter.  Meds ordered this encounter  Medications   chlorpheniramine-HYDROcodone  (TUSSIONEX) 10-8 MG/5ML    Sig: Take 5 mLs by mouth at bedtime as needed.    Dispense:  115 mL    Refill:  0      Procedures: No procedures performed   Clinical Data: No additional findings.   Subjective: Chief Complaint  Patient presents with   Lower Back - Fracture    DOI 08/18/2023    HPI 78 year old male seen with L3 compression fracture after Friday morning lifting a little awful well which was concrete weighs about 200 pounds with sudden onset of severe low back pain.  Patient had chronic superior endplate L2 compression which was present in 2015 unchanged.  Patient had a history of lung cancer treated with chemo and surgical resection.  Patient still smoking.  He is on gabapentin  800 mg 3 times daily and  ibuprofen .  He is placed on some hydrocodone  patient states it really is not working.  Past history of vertebroplasty T7 done in May by Dr. Kathlynn at [place].  No history of bone density test.  Patient states daughter-in-law does bone density test at [facility].  Review of Systems positive COPD chronic pain history of narcotic addiction.   Objective: Vital Signs: BP 134/80   Pulse 68   Ht 5' 7 (1.702 m)   Wt 135 lb (61.2 kg)   BMI 21.14 kg/m   Physical Exam Constitutional:      Appearance: He is well-developed.  HENT:     Head: Normocephalic and atraumatic.     Right Ear: External ear normal.     Left Ear: External ear normal.  Eyes:     Pupils: Pupils are equal, round, and reactive to light.  Neck:     Thyroid : No thyromegaly.     Trachea: No tracheal deviation.  Cardiovascular:     Rate and Rhythm: Normal rate.  Pulmonary:     Effort: Pulmonary effort is normal.     Breath sounds: No wheezing.  Abdominal:     General: Bowel sounds are normal.     Palpations: Abdomen is soft.  Musculoskeletal:     Cervical back: Neck supple.  Skin:  General: Skin is warm and dry.     Capillary Refill: Capillary refill takes less than 2 seconds.  Neurological:     Mental Status: He is alert and oriented to person, place, and time.  Psychiatric:        Behavior: Behavior normal.        Thought Content: Thought content normal.        Judgment: Judgment normal.     Ortho Exam pain with change in position.  Lower extremity motor function intact.  Specialty Comments:  No specialty comments available.  Imaging: Narrative & Impression  CLINICAL DATA:  78 year old male with pain after lifting injury.   EXAM: LUMBAR SPINE - COMPLETE 4+ VIEW   COMPARISON:  CT Abdomen 03/31/2014.   FINDINGS: Advanced Aortoiliac calcified atherosclerosis. Tortuous abdominal aorta.   Normal lumbar segmentation. L3 compression fracture with inferior endplate deformity is moderate, estimated 35%  anterior loss of vertebral height there. Chronic L2 superior endplate compression fracture was present in 2015. Underlying osteopenia. Other visible vertebral height maintained. No other acute osseous abnormality identified. Relatively maintained disc spaces. Nonobstructed bowel-gas pattern.   IMPRESSION: 1. Osteopenia. Moderate L3 compression fracture is new since 2015, age indeterminate but suspicious for acute fracture in this setting. If specific therapy such as vertebroplasty is desired, Lumbar MRI or Nuclear Medicine Whole-body Bone Scan would best determine acuity. 2. Chronic L2 compression fracture stable since 2015. 3. Severe Aortic Atherosclerosis (ICD10-I70.0).     Electronically Signed   By: VEAR Hurst M.D.   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   COPD (chronic obstructive pulmonary disease) (HCC) 09/28/2015   Malignant neoplastic disease (HCC) 09/28/2015   Acute bronchitis with bronchospasm 08/28/2015   Essential (primary) hypertension 06/16/2015   Peripheral neuropathic pain 01/14/2015   Cancer of lung (HCC) 05/16/2012   Tobacco user 05/04/2012   Non-small cell carcinoma of lung (HCC) 05/04/2012   Past Medical History:  Diagnosis Date   Anxiety    Bronchitis    Cancer (HCC) 2016   COPD (chronic obstructive pulmonary disease) (HCC)    Cough 07/13/2007   Qualifier: Diagnosis of   By: Georgian ROSALEA CHARM Lamar    Dyspnea    due to copd   Hypertension    pt denies this and is not on bp meds but vascular note states htn   LUNG NODULE 07/13/2007   Qualifier: Diagnosis of  By: Georgian ROSALEA CHARM Lamar    Pneumonia 2013   Thoracic aortic aneurysm [facility])    followed by vascular    Family History  Problem Relation Age of Onset   COPD Mother     Past Surgical History:  Procedure Laterality Date   BIOPSY  07/13/2023   Procedure: BIOPSY;  Surgeon: Onita Elspeth Sharper, DO;  Location: [facility] ENDOSCOPY;  Service: Gastroenterology;;   COLONOSCOPY N/A 10/27/2022   Procedure: COLONOSCOPY;  Surgeon: Onita Elspeth Sharper, DO;  Location: [facility] ENDOSCOPY;  Service: Gastroenterology;  Laterality: N/A;   COLONOSCOPY WITH PROPOFOL  N/A 07/13/2023   Procedure: COLONOSCOPY WITH PROPOFOL ;  Surgeon: Onita Elspeth Sharper, DO;  Location: [facility] ENDOSCOPY;  Service: Gastroenterology;  Laterality: N/A;   CORONARY PRESSURE/FFR STUDY N/A 05/19/2020   Procedure: INTRAVASCULAR PRESSURE WIRE/FFR STUDY;  Surgeon: Ammon Blunt, MD;  Location: [facility] INVASIVE CV LAB;  Service: Cardiovascular;  Laterality: N/A;   DIAGNOSTIC LAPAROSCOPY     ESOPHAGOGASTRODUODENOSCOPY N/A 10/27/2022   Procedure: ESOPHAGOGASTRODUODENOSCOPY (EGD);  Surgeon: Onita Elspeth Sharper, DO;  Location: [facility] ENDOSCOPY;  Service: Gastroenterology;  Laterality: N/A;   EYE SURGERY Bilateral    HERNIA REPAIR     KYPHOPLASTY N/A 12/13/2018   Procedure: KYPHOPLASTY- T7;  Surgeon: Kathlynn Sharper, MD;  Location: [facility] ORS;  Service: Orthopedics;  Laterality: N/A;   LEFT HEART CATH N/A 05/11/2022   Procedure: Left Heart Cath;  Surgeon: Ammon Blunt, MD;  Location: [facility] INVASIVE CV LAB;  Service: Cardiovascular;  Laterality: N/A;   LEFT HEART CATH AND CORONARY ANGIOGRAPHY Left 05/19/2020   Procedure: LEFT HEART CATH AND CORONARY ANGIOGRAPHY;  Surgeon: Ammon Blunt, MD;  Location: [facility] INVASIVE CV LAB;  Service: Cardiovascular;  Laterality:  Left;   LUNG LOBECTOMY Right    upper lung due to asbestos exposure   XI ROBOTIC ASSISTED INGUINAL HERNIA REPAIR WITH MESH Right 07/19/2019   Procedure: XI ROBOTIC ASSISTED INGUINAL HERNIA REPAIR WITH MESH;  Surgeon: Tye Millet, DO;  Location: [facility] ORS;  Service: General;  Laterality: Right;   Social History   Occupational History   Not on file  Tobacco Use   Smoking status: Every Day    Current packs/day: 0.50    Average packs/day: 0.5 packs/day for 60.0 years (30.0 ttl pk-yrs)    Types: Cigarettes   Smokeless tobacco: Never   Tobacco comments:    5-10cigarettes daily-03/14/2023  Vaping Use   Vaping status: Never Used  Substance and Sexual Activity   Alcohol  use: Yes    Alcohol /week: 12.0 standard drinks of alcohol     Types: 12 Standard drinks or equivalent per week    Comment: Drinks 3-4 beers every night   Drug use: No   Sexual activity: Not on file

## 2023-08-21 NOTE — Addendum Note (Signed)
 Addended by: Rogers Seeds on: 08/21/2023 11:05 AM   Modules accepted: Orders

## 2023-08-29 DIAGNOSIS — R490 Dysphonia: Secondary | ICD-10-CM | POA: Diagnosis not present

## 2023-09-12 ENCOUNTER — Ambulatory Visit (INDEPENDENT_AMBULATORY_CARE_PROVIDER_SITE_OTHER): Payer: Medicare HMO | Admitting: Orthopaedic Surgery

## 2023-09-12 ENCOUNTER — Encounter: Payer: Self-pay | Admitting: Orthopaedic Surgery

## 2023-09-12 ENCOUNTER — Other Ambulatory Visit (INDEPENDENT_AMBULATORY_CARE_PROVIDER_SITE_OTHER): Payer: Medicare HMO

## 2023-09-12 VITALS — BP 175/93 | HR 62 | Ht 67.0 in | Wt 135.0 lb

## 2023-09-12 DIAGNOSIS — S32030A Wedge compression fracture of third lumbar vertebra, initial encounter for closed fracture: Secondary | ICD-10-CM

## 2023-09-12 MED ORDER — HYDROCODONE-ACETAMINOPHEN 5-325 MG PO TABS: 1.0000 | ORAL_TABLET | Freq: Two times a day (BID) | ORAL | 0 refills | Status: AC | PRN

## 2023-09-12 NOTE — Progress Notes (Signed)
 Office Visit Note   Patient: Stephen Leon           Date of Birth: January 25, 1946           MRN: [id number] Visit Date: 09/12/2023              Requested by: [name], MD [address] [facility] [place],  [place] [address] PCP: [name], MD   Assessment & Plan: Visit Diagnoses:  1. Closed compression fracture of L3 lumbar vertebra, initial encounter (HCC)     Plan: Recheck 1 month repeat lateral lumbar x-ray on return.  Proceed with bone density test as planned.  We given a cane to help with ambulation and Norco 30 tablets prescribed 1 p.o. every 12 as needed compression fracture pain.  He needs to cut back on the ibuprofen  and not take more than 2 or 3 a day maximum.  Follow-Up Instructions: No follow-ups on file.   Orders:  Orders Placed This Encounter  Procedures   XR Lumbar Spine 2-3 Views   Meds ordered this encounter  Medications   HYDROcodone -acetaminophen  (NORCO/VICODIN) 5-325 MG tablet    Sig: Take 1 tablet by mouth every 12 (twelve) hours as needed for moderate pain (pain score 4-6).    Dispense:  30 tablet    Refill:  0      Procedures: No procedures performed   Clinical Data: No additional findings.   Subjective: Chief Complaint  Patient presents with   Lower Back - Follow-up, Fracture    DOI 08/18/2023    HPI 78 year old male returns with L3 compression fracture date of injury 08/18/2023 picking up well well cover which was concrete approximately 200 pounds.  Previous L2 fracture sometime before 2015 and previous thoracic compression fracture treated with vertebroplasty in [place].  He has been on gabapentin  4 times a day also taking ibuprofen  800 mg 4 times a day.  He smokes has problems with a cough and uses hydrocodone  chlorphenamine suspension at night at times with cough.  Continued ongoing pain since his injury.  Bone density test ordered but has not been done.  Review of Systems updated  unchanged.   Objective: Vital Signs: BP (!) 175/93   Pulse 62   Ht 5' 7 (1.702 m)   Wt 135 lb (61.2 kg)   BMI 21.14 kg/m   Physical Exam Constitutional:      Appearance: He is well-developed.  HENT:     Head: Normocephalic and atraumatic.     Right Ear: External ear normal.     Left Ear: External ear normal.  Eyes:     Pupils: Pupils are equal, round, and reactive to light.  Neck:     Thyroid : No thyromegaly.     Trachea: No tracheal deviation.  Cardiovascular:     Rate and Rhythm: Normal rate.  Pulmonary:     Effort: Pulmonary effort is normal.     Breath sounds: No wheezing.  Abdominal:     General: Bowel sounds are normal.     Palpations: Abdomen is soft.  Musculoskeletal:     Cervical back: Neck supple.  Skin:    General: Skin is warm and dry.     Capillary Refill: Capillary refill takes less than 2 seconds.  Neurological:     Mental Status: He is alert and oriented to person, place, and time.  Psychiatric:        Behavior: Behavior normal.        Thought Content: Thought content  normal.        Judgment: Judgment normal.     Ortho Exam slight numbness over the anterior right thigh negative logroll hips he is able to get from sitting standing is ambulatory stays in slight lumbar flexed position when he gets up.  Specialty Comments:  No specialty comments available.  Imaging: XR Lumbar Spine 2-3 Views Result Date: 09/12/2023 AP lateral lumbar images are obtained and reviewed shows unchanged L3 fracture acute but has not progressed.  Again noted old L2 slight compression stable. Impression: L3 compression fracture stable without progression comparison to 08/18/2023 images.    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   COPD (chronic obstructive pulmonary disease) (HCC) 09/28/2015   Malignant neoplastic disease (HCC) 09/28/2015   Acute bronchitis with bronchospasm 08/28/2015   Essential (primary) hypertension 06/16/2015   Peripheral neuropathic pain 01/14/2015   Cancer of lung (HCC) 05/16/2012   Tobacco user 05/04/2012   Non-small cell carcinoma of lung (HCC) 05/04/2012   Past Medical History:  Diagnosis Date   Anxiety    Bronchitis    Cancer (HCC) 2016   COPD (chronic obstructive pulmonary disease) (HCC)    Cough 07/13/2007   Qualifier: Diagnosis of  By: Georgian ROSALEA CHARM Lamar    Dyspnea    due to copd   Hypertension    pt denies this and is not on bp meds but vascular note states htn   LUNG NODULE 07/13/2007   Qualifier: Diagnosis of  By: Georgian ROSALEA CHARM Lamar    Pneumonia 2013   Thoracic aortic aneurysm [facility])    followed by vascular    Family History  Problem Relation Age of Onset   COPD Mother     Past Surgical History:  Procedure Laterality Date   BIOPSY  07/13/2023   Procedure: BIOPSY;  Surgeon: Onita Elspeth Sharper, DO;  Location: [facility] ENDOSCOPY;  Service: Gastroenterology;;   COLONOSCOPY N/A 10/27/2022   Procedure: COLONOSCOPY;  Surgeon: Onita Elspeth Sharper, DO;  Location: [facility] ENDOSCOPY;  Service: Gastroenterology;  Laterality: N/A;   COLONOSCOPY WITH PROPOFOL  N/A 07/13/2023   Procedure: COLONOSCOPY WITH PROPOFOL ;  Surgeon: Onita Elspeth Sharper, DO;  Location: [facility] ENDOSCOPY;  Service: Gastroenterology;  Laterality: N/A;   CORONARY PRESSURE/FFR STUDY N/A 05/19/2020   Procedure: INTRAVASCULAR PRESSURE WIRE/FFR STUDY;  Surgeon: Ammon Blunt, MD;  Location: [facility] INVASIVE  CV LAB;  Service: Cardiovascular;  Laterality: N/A;   DIAGNOSTIC LAPAROSCOPY     ESOPHAGOGASTRODUODENOSCOPY N/A 10/27/2022   Procedure: ESOPHAGOGASTRODUODENOSCOPY (EGD);  Surgeon: Onita Elspeth Sharper, DO;  Location: [facility] ENDOSCOPY;  Service: Gastroenterology;  Laterality: N/A;   EYE SURGERY Bilateral    HERNIA REPAIR     KYPHOPLASTY N/A 12/13/2018   Procedure: KYPHOPLASTY- T7;  Surgeon: Kathlynn Sharper, MD;  Location: [facility] ORS;  Service: Orthopedics;  Laterality: N/A;   LEFT HEART CATH N/A 05/11/2022   Procedure: Left Heart Cath;  Surgeon: Ammon Blunt, MD;  Location: [facility] INVASIVE CV LAB;  Service: Cardiovascular;  Laterality: N/A;   LEFT HEART  CATH AND CORONARY ANGIOGRAPHY Left 05/19/2020   Procedure: LEFT HEART CATH AND CORONARY ANGIOGRAPHY;  Surgeon: Ammon Blunt, MD;  Location: [facility] INVASIVE CV LAB;  Service: Cardiovascular;  Laterality: Left;   LUNG LOBECTOMY Right    upper lung due to asbestos exposure   XI ROBOTIC ASSISTED INGUINAL HERNIA REPAIR WITH MESH Right 07/19/2019   Procedure: XI ROBOTIC ASSISTED INGUINAL HERNIA REPAIR WITH MESH;  Surgeon: Tye Millet, DO;  Location: [facility] ORS;  Service: General;  Laterality: Right;   Social History   Occupational History   Not on file  Tobacco Use   Smoking status: Every Day    Current packs/day: 0.50    Average packs/day: 0.5 packs/day for 60.0 years (30.0 ttl pk-yrs)    Types: Cigarettes   Smokeless tobacco: Never   Tobacco comments:    5-10cigarettes daily-03/14/2023  Vaping Use   Vaping status: Never Used  Substance and Sexual Activity   Alcohol  use: Yes    Alcohol /week: 12.0 standard drinks of alcohol     Types: 12 Standard drinks or equivalent per week    Comment: Drinks 3-4 beers every night   Drug use: No   Sexual activity: Not on file

## 2023-09-14 DIAGNOSIS — M810 Age-related osteoporosis without current pathological fracture: Secondary | ICD-10-CM | POA: Diagnosis not present

## 2023-09-20 DIAGNOSIS — J4489 Other specified chronic obstructive pulmonary disease: Secondary | ICD-10-CM | POA: Diagnosis not present

## 2023-09-20 DIAGNOSIS — Z902 Acquired absence of lung [part of]: Secondary | ICD-10-CM | POA: Diagnosis not present

## 2023-09-20 DIAGNOSIS — S32030A Wedge compression fracture of third lumbar vertebra, initial encounter for closed fracture: Secondary | ICD-10-CM | POA: Diagnosis not present

## 2023-09-20 DIAGNOSIS — M810 Age-related osteoporosis without current pathological fracture: Secondary | ICD-10-CM | POA: Diagnosis not present

## 2023-09-20 DIAGNOSIS — F17210 Nicotine dependence, cigarettes, uncomplicated: Secondary | ICD-10-CM | POA: Diagnosis not present

## 2023-09-20 DIAGNOSIS — I77810 Thoracic aortic ectasia: Secondary | ICD-10-CM | POA: Diagnosis not present

## 2023-09-20 DIAGNOSIS — Z9181 History of falling: Secondary | ICD-10-CM | POA: Diagnosis not present

## 2023-09-20 DIAGNOSIS — K5790 Diverticulosis of intestine, part unspecified, without perforation or abscess without bleeding: Secondary | ICD-10-CM | POA: Diagnosis not present

## 2023-09-22 ENCOUNTER — Telehealth: Payer: Self-pay | Admitting: Radiology

## 2023-09-22 DIAGNOSIS — S32030A Wedge compression fracture of third lumbar vertebra, initial encounter for closed fracture: Secondary | ICD-10-CM

## 2023-09-22 NOTE — Telephone Encounter (Signed)
Per Dr. Ophelia Charter, Bone Density Scan shows osteoporosis. Refer to [place] with osteoporosis clinic. I called paitent and advised. He continues to have pain, is taking hydrocodone at 7am and 7pm with an ibuprofen and gabapentin in the middle of the day. He states that the pain is waking him and he hurts for about an hour until the medication sets in. He is managing at this point and will call me if he needs anything prior to next scheduled visit with Dr. Ophelia Charter.

## 2023-09-26 DIAGNOSIS — Z9889 Other specified postprocedural states: Secondary | ICD-10-CM | POA: Diagnosis not present

## 2023-09-26 DIAGNOSIS — R0602 Shortness of breath: Secondary | ICD-10-CM | POA: Diagnosis not present

## 2023-09-26 DIAGNOSIS — Z902 Acquired absence of lung [part of]: Secondary | ICD-10-CM | POA: Diagnosis not present

## 2023-09-26 DIAGNOSIS — I2510 Atherosclerotic heart disease of native coronary artery without angina pectoris: Secondary | ICD-10-CM

## 2023-09-26 DIAGNOSIS — I214 Non-ST elevation (NSTEMI) myocardial infarction: Secondary | ICD-10-CM | POA: Diagnosis not present

## 2023-09-26 DIAGNOSIS — I10 Essential (primary) hypertension: Secondary | ICD-10-CM | POA: Diagnosis not present

## 2023-09-26 DIAGNOSIS — Z720 Tobacco use: Secondary | ICD-10-CM | POA: Diagnosis not present

## 2023-09-26 DIAGNOSIS — J418 Mixed simple and mucopurulent chronic bronchitis: Secondary | ICD-10-CM | POA: Diagnosis not present

## 2023-09-26 HISTORY — DX: Atherosclerotic heart disease of native coronary artery without angina pectoris: I25.10

## 2023-10-02 ENCOUNTER — Ambulatory Visit (INDEPENDENT_AMBULATORY_CARE_PROVIDER_SITE_OTHER): Payer: Medicare HMO | Admitting: Physician Assistant

## 2023-10-02 ENCOUNTER — Encounter: Payer: Self-pay | Admitting: Physician Assistant

## 2023-10-02 VITALS — Ht 66.0 in | Wt 135.8 lb

## 2023-10-02 DIAGNOSIS — M810 Age-related osteoporosis without current pathological fracture: Secondary | ICD-10-CM | POA: Insufficient documentation

## 2023-10-02 NOTE — Progress Notes (Signed)
 Office Visit Note   Patient: Stephen Leon           Date of Birth: 1945/12/27           MRN: [id number] Visit Date: 10/02/2023              Requested by: Eldred Manges, MD [address] [place],  [place] [address] PCP: Barbette Reichmann, MD   Assessment & Plan: Visit Diagnoses:  1. Age-related osteoporosis without current pathological fracture     Plan: Patient is a pleasant 78 year old gentleman who is referred from Dr. Ophelia Charter for evaluation for osteoporosis.  He has a history of an L3 lumbar spine fracture recently.  Compression fracture which occurred when he was lifting something heavy.  No fall was involved.  Also has a history of a T7 fracture 4 years ago.  He does supplement with vitamin D but has not had a vitamin D level done.  He does have a history of heart disease but has not had a heart attack or stroke in the past year.  Has a history of lung carcinoma but currently in remission.  No history of kidney disease ulcer disease bypass surgery.  He does have severe reflux.  Does not have a history of epilepsy or seizures.  Does not take any calcium but has adequate calcium stores.  He is a current smoker smoking half a pack of cigarettes daily.  He drinks 4 cans of light beer nightly.  Not currently exercising.  He did have a bone density test with lumbar spine at -2.8 hip -2.9 that combined with his fractures makes him classified as osteoporotic.  Will draw a vitamin D level today.  Would review his labs.  Would be a candidate for Reclast or perhaps Prolia.  FRAX score is 16 for major osteoporotic fracture in the next 10 years and 9.64 hip fracture.  45 minutes of time was spent reviewing his chart and his labs discussing him with him treatment options  Follow-Up Instructions: Will contact after labs are reviewed  Orders:  No orders of the defined types were placed in this encounter.  No orders of the defined types were placed in this encounter.     Procedures: No  procedures performed   Clinical Data: No additional findings.   Subjective: Chief Complaint  Patient presents with   Osteoporosis    HPI pleasant 78 year old gentleman who is very active.  Referred by Dr. Ophelia Charter as he has had a L3 fracture after lifting something heavy at his home.  Fall was not involved.  Also has a history of T7 fracture a few years ago.  Not currently doing any osteoporosis treatment.  Has a BMI of 21  Review of Systems  All other systems reviewed and are negative.    Objective: Vital Signs: Ht 5\' 6"  (1.676 m)   Wt 135 lb 12.8 oz (61.6 kg)   BMI 21.92 kg/m   Physical Exam HENT:     Head: Normocephalic.  Pulmonary:     Effort: Pulmonary effort is normal.  Skin:    General: Skin is warm and dry.  Neurological:     General: No focal deficit present.     Mental Status: He is alert and oriented to person, place, and time.  Psychiatric:        Mood and Affect: Mood normal.        Behavior: Behavior normal.       Specialty Comments:  No specialty comments available.  Imaging: No results found.   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) (T10 & T11 07/11/2016   GAD (generalized anxiety disorder) 12/15/2015   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   COPD (chronic obstructive pulmonary disease) (HCC) 09/28/2015   Malignant neoplastic disease (HCC) 09/28/2015   Acute bronchitis with bronchospasm 08/28/2015   Essential (primary)  hypertension 06/16/2015   Peripheral neuropathic pain 01/14/2015   Cancer of lung (HCC) 05/16/2012   Tobacco user 05/04/2012   Non-small cell carcinoma of lung (HCC) 05/04/2012   Past Medical History:  Diagnosis Date   Anxiety    Bronchitis    Cancer (HCC) 2016   COPD (chronic obstructive pulmonary disease) (HCC)    Cough 07/13/2007   Qualifier: Diagnosis of  By: Nena Jordan    Dyspnea    due to copd   Hypertension    pt denies this and is not on bp meds but vascular note states htn   LUNG NODULE 07/13/2007   Qualifier: Diagnosis of  By: Nena Jordan    Pneumonia 2013   Thoracic aortic aneurysm [facility])    followed by vascular    Family History  Problem Relation Age of Onset   COPD Mother     Past Surgical History:  Procedure Laterality Date   BIOPSY  07/13/2023   Procedure: BIOPSY;  Surgeon: Jaynie Collins, DO;  Location: [facility] ENDOSCOPY;  Service: Gastroenterology;;   COLONOSCOPY N/A 10/27/2022   Procedure: COLONOSCOPY;  Surgeon: Jaynie Collins, DO;  Location: [facility] ENDOSCOPY;  Service: Gastroenterology;  Laterality: N/A;   COLONOSCOPY WITH PROPOFOL N/A 07/13/2023   Procedure: COLONOSCOPY WITH PROPOFOL;  Surgeon: Jaynie Collins, DO;  Location: [facility] ENDOSCOPY;  Service: Gastroenterology;  Laterality: N/A;   CORONARY PRESSURE/FFR STUDY N/A 05/19/2020   Procedure: INTRAVASCULAR PRESSURE WIRE/FFR STUDY;  Surgeon: Marcina Millard, MD;  Location: [facility] INVASIVE CV LAB;  Service: Cardiovascular;  Laterality: N/A;   DIAGNOSTIC LAPAROSCOPY     ESOPHAGOGASTRODUODENOSCOPY N/A 10/27/2022   Procedure: ESOPHAGOGASTRODUODENOSCOPY (EGD);  Surgeon: Jaynie Collins, DO;  Location: [facility] ENDOSCOPY;  Service: Gastroenterology;  Laterality: N/A;   EYE SURGERY Bilateral    HERNIA REPAIR     KYPHOPLASTY N/A 12/13/2018   Procedure: KYPHOPLASTY- T7;  Surgeon: Kennedy Bucker, MD;  Location: [facility] ORS;  Service: Orthopedics;  Laterality: N/A;   LEFT HEART CATH N/A  05/11/2022   Procedure: Left Heart Cath;  Surgeon: Marcina Millard, MD;  Location: [facility] INVASIVE CV LAB;  Service: Cardiovascular;  Laterality: N/A;   LEFT HEART CATH AND CORONARY ANGIOGRAPHY Left 05/19/2020   Procedure: LEFT HEART CATH AND CORONARY ANGIOGRAPHY;  Surgeon: Marcina Millard, MD;  Location: [facility] INVASIVE CV LAB;  Service: Cardiovascular;  Laterality: Left;   LUNG LOBECTOMY Right    upper lung due to asbestos exposure   XI ROBOTIC ASSISTED INGUINAL HERNIA REPAIR WITH MESH Right 07/19/2019   Procedure: XI ROBOTIC ASSISTED INGUINAL HERNIA REPAIR WITH MESH;  Surgeon: Sung Amabile, DO;  Location: [facility] ORS;  Service: General;  Laterality: Right;   Social History  Occupational History   Not on file  Tobacco Use   Smoking status: Every Day    Current packs/day: 0.50    Average packs/day: 0.5 packs/day for 60.0 years (30.0 ttl pk-yrs)    Types: Cigarettes   Smokeless tobacco: Never   Tobacco comments:    5-10cigarettes daily-03/14/2023  Vaping Use   Vaping status: Never Used  Substance and Sexual Activity   Alcohol use: Yes    Alcohol/week: 12.0 standard drinks of alcohol    Types: 12 Standard drinks or equivalent per week    Comment: Drinks 3-4 beers every night   Drug use: No   Sexual activity: Not on file

## 2023-10-03 LAB — VITAMIN D 25 HYDROXY (VIT D DEFICIENCY, FRACTURES): Vit D, 25-Hydroxy: 47 ng/mL (ref 30–100)

## 2023-10-03 LAB — TESTOSTERONE: Testosterone: 564 ng/dL (ref 250–827)

## 2023-10-03 LAB — TSH: TSH: 0.69 m[IU]/L (ref 0.40–4.50)

## 2023-10-03 LAB — PARATHYROID HORMONE, INTACT (NO CA): PTH: 30 pg/mL (ref 16–77)

## 2023-10-06 ENCOUNTER — Telehealth: Payer: Self-pay

## 2023-10-06 NOTE — Telephone Encounter (Signed)
 VOB submitted for Prolia online through [employer] portal.

## 2023-10-10 ENCOUNTER — Ambulatory Visit: Payer: Medicare HMO | Admitting: Orthopaedic Surgery

## 2023-10-10 ENCOUNTER — Encounter: Payer: Self-pay | Admitting: Orthopaedic Surgery

## 2023-10-10 ENCOUNTER — Other Ambulatory Visit (INDEPENDENT_AMBULATORY_CARE_PROVIDER_SITE_OTHER): Payer: Self-pay

## 2023-10-10 DIAGNOSIS — S32030A Wedge compression fracture of third lumbar vertebra, initial encounter for closed fracture: Secondary | ICD-10-CM | POA: Diagnosis not present

## 2023-10-10 NOTE — Progress Notes (Signed)
 Office Visit Note   Patient: Stephen Leon           Date of Birth: May 04, 1946           MRN: [id number] Visit Date: 10/10/2023              Requested by: Barbette Reichmann, MD [address] [facility] [place],  [place] [address] PCP: Barbette Reichmann, MD   Assessment & Plan: Visit Diagnoses:  1. Closed compression fracture of L3 lumbar vertebra, initial encounter [facility])     Plan: Patient is getting better every month.  His x-ray is stable looks like he has sclerotic healing with no further compression now at 3 months postinjury.  He got some hydrocodone from his PCP since he could not get through to our office by telephone after multiple tries.  He will gradually wean off his pain medication continue ambulating.  Avoid heavy lifting return as needed.  Follow-Up Instructions: No follow-ups on file.   Orders:  Orders Placed This Encounter  Procedures   XR Lumbar Spine 2-3 Views   No orders of the defined types were placed in this encounter.     Procedures: No procedures performed   Clinical Data: No additional findings.   Subjective: Chief Complaint  Patient presents with   Lower Back - Follow-up    L3 compression fracture DOI 08/18/2023    HPI patient returns he still has back pain with the pain and stiffness when he first gets up but is better after an hour or so and then later on the day he starts having increased discomfort.  He uses his cane whenever he goes out of the house.  Is been almost 3 months he is getting better every month but is frustrated that he has not gotten complete resolution of his symptoms.  Patient states his right thigh symptoms have resolved but he still has back pain worse with increased activity.  Review of Systems 14 point system unchanged from 2//25 office visit.   Objective: Vital Signs: There were no vitals taken for this visit.  Physical Exam Constitutional:      Appearance: He is well-developed.   HENT:     Head: Normocephalic and atraumatic.     Right Ear: External ear normal.     Left Ear: External ear normal.  Eyes:     Pupils: Pupils are equal, round, and reactive to light.  Neck:     Thyroid: No thyromegaly.     Trachea: No tracheal deviation.  Cardiovascular:     Rate and Rhythm: Normal rate.  Pulmonary:     Effort: Pulmonary effort is normal.     Breath sounds: No wheezing.  Abdominal:     General: Bowel sounds are normal.     Palpations: Abdomen is soft.  Musculoskeletal:     Cervical back: Neck supple.  Skin:    General: Skin is warm and dry.     Capillary Refill: Capillary refill takes less than 2 seconds.  Neurological:     Mental Status: He is alert and oriented to person, place, and time.  Psychiatric:        Behavior: Behavior normal.        Thought Content: Thought content normal.        Judgment: Judgment normal.     Ortho Exam patient is slightly slow getting from sitting standing amatory without limp.  Specialty Comments:  No specialty comments available.  Imaging: No results found.   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) (T10 & T11 07/11/2016   GAD (generalized anxiety disorder) 12/15/2015   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   COPD (chronic obstructive pulmonary disease) (HCC) 09/28/2015   Malignant neoplastic disease (HCC) 09/28/2015   Acute bronchitis with bronchospasm 08/28/2015   Essential (primary)  hypertension 06/16/2015   Peripheral neuropathic pain 01/14/2015   Cancer of lung (HCC) 05/16/2012   Tobacco user 05/04/2012   Non-small cell carcinoma of lung (HCC) 05/04/2012   Past Medical History:  Diagnosis Date   Anxiety    Bronchitis    Cancer (HCC) 2016   COPD (chronic obstructive pulmonary disease) (HCC)    Cough 07/13/2007   Qualifier: Diagnosis of  By: Nena Jordan    Dyspnea    due to copd   Hypertension    pt denies this and is not on bp meds but vascular note states htn   LUNG NODULE 07/13/2007   Qualifier: Diagnosis of  By: Nena Jordan    Pneumonia 2013   Thoracic aortic aneurysm [facility])    followed by vascular    Family History  Problem Relation Age of Onset   COPD Mother     Past Surgical History:  Procedure Laterality Date   BIOPSY  07/13/2023   Procedure: BIOPSY;  Surgeon: Jaynie Collins, DO;  Location: [facility] ENDOSCOPY;  Service: Gastroenterology;;   COLONOSCOPY N/A 10/27/2022   Procedure: COLONOSCOPY;  Surgeon: Jaynie Collins, DO;  Location[facility] ENDOSCOPY;  Service: Gastroenterology;  Laterality: N/A;   COLONOSCOPY WITH PROPOFOL N/A 07/13/2023   Procedure: COLONOSCOPY WITH PROPOFOL;  Surgeon: Jaynie Collins, DO;  Location: [facility] ENDOSCOPY;  Service: Gastroenterology;  Laterality: N/A;   CORONARY PRESSURE/FFR STUDY N/A 05/19/2020   Procedure: INTRAVASCULAR PRESSURE WIRE/FFR STUDY;  Surgeon: Marcina Millard, MD;  Location: [facility] INVASIVE CV LAB;  Service: Cardiovascular;  Laterality: N/A;   DIAGNOSTIC LAPAROSCOPY     ESOPHAGOGASTRODUODENOSCOPY N/A 10/27/2022   Procedure: ESOPHAGOGASTRODUODENOSCOPY (EGD);  Surgeon: Jaynie Collins, DO;  Location: [facility] ENDOSCOPY;  Service: Gastroenterology;  Laterality: N/A;   EYE SURGERY Bilateral    HERNIA REPAIR     KYPHOPLASTY N/A 12/13/2018   Procedure: KYPHOPLASTY- T7;  Surgeon: Kennedy Bucker, MD;  Location: [facility] ORS;  Service: Orthopedics;  Laterality: N/A;   LEFT HEART CATH N/A  05/11/2022   Procedure: Left Heart Cath;  Surgeon: Marcina Millard, MD;  Location: [facility] INVASIVE CV LAB;  Service: Cardiovascular;  Laterality: N/A;   LEFT HEART CATH AND CORONARY ANGIOGRAPHY Left 05/19/2020   Procedure: LEFT HEART CATH AND CORONARY ANGIOGRAPHY;  Surgeon: Marcina Millard, MD;  Location: [facility] INVASIVE CV LAB;  Service: Cardiovascular;  Laterality: Left;   LUNG LOBECTOMY Right    upper lung due to asbestos exposure   XI ROBOTIC ASSISTED INGUINAL HERNIA REPAIR WITH MESH Right 07/19/2019   Procedure: XI ROBOTIC ASSISTED INGUINAL HERNIA REPAIR WITH MESH;  Surgeon: Sung Amabile, DO;  Location: [facility] ORS;  Service: General;  Laterality: Right;   Social History   Occupational History   Not on  file  Tobacco Use   Smoking status: Every Day    Current packs/day: 0.50    Average packs/day: 0.5 packs/day for 60.0 years (30.0 ttl pk-yrs)    Types: Cigarettes   Smokeless tobacco: Never   Tobacco comments:    5-10cigarettes daily-03/14/2023  Vaping Use   Vaping status: Never Used  Substance and Sexual Activity   Alcohol use: Yes    Alcohol/week: 12.0 standard drinks of alcohol    Types: 12 Standard drinks or equivalent per week    Comment: Drinks 3-4 beers every night   Drug use: No   Sexual activity: Not on file

## 2023-10-18 ENCOUNTER — Telehealth: Payer: Self-pay | Admitting: Orthopaedic Surgery

## 2023-10-18 NOTE — Telephone Encounter (Signed)
 Patient request xray disc and report for dates 09/12/23 and 10/10/23 ordered by Dr Ophelia Charter. Pt is going to see Dr Franky Macho and needs   Pt's call back [phone]

## 2023-10-18 NOTE — Telephone Encounter (Signed)
 CD ready and given to Tammy.

## 2023-10-18 NOTE — Telephone Encounter (Signed)
 IC patient. Advised records and xray CD are ready to pick up. Auth attached for patient to sign.

## 2023-10-23 DIAGNOSIS — S32030A Wedge compression fracture of third lumbar vertebra, initial encounter for closed fracture: Secondary | ICD-10-CM | POA: Diagnosis not present

## 2023-11-02 DIAGNOSIS — Z720 Tobacco use: Secondary | ICD-10-CM | POA: Diagnosis not present

## 2023-11-02 DIAGNOSIS — M8080XA Other osteoporosis with current pathological fracture, unspecified site, initial encounter for fracture: Secondary | ICD-10-CM | POA: Diagnosis not present

## 2023-11-14 DIAGNOSIS — M8080XA Other osteoporosis with current pathological fracture, unspecified site, initial encounter for fracture: Secondary | ICD-10-CM | POA: Diagnosis not present

## 2023-12-04 ENCOUNTER — Telehealth: Payer: Self-pay

## 2023-12-04 NOTE — Telephone Encounter (Signed)
 Called and spoke to patient and he said he will no longer be interested in Prolia with us  that he went elsewhere

## 2023-12-08 ENCOUNTER — Encounter: Payer: Self-pay | Admitting: Radiology

## 2024-01-11 DIAGNOSIS — M818 Other osteoporosis without current pathological fracture: Secondary | ICD-10-CM | POA: Diagnosis not present

## 2024-01-11 DIAGNOSIS — S32030A Wedge compression fracture of third lumbar vertebra, initial encounter for closed fracture: Secondary | ICD-10-CM | POA: Diagnosis not present

## 2024-01-11 DIAGNOSIS — J449 Chronic obstructive pulmonary disease, unspecified: Secondary | ICD-10-CM | POA: Diagnosis not present

## 2024-01-11 DIAGNOSIS — I2510 Atherosclerotic heart disease of native coronary artery without angina pectoris: Secondary | ICD-10-CM | POA: Diagnosis not present

## 2024-01-11 DIAGNOSIS — Z9181 History of falling: Secondary | ICD-10-CM | POA: Diagnosis not present

## 2024-01-11 DIAGNOSIS — Z720 Tobacco use: Secondary | ICD-10-CM | POA: Diagnosis not present

## 2024-01-11 DIAGNOSIS — C3490 Malignant neoplasm of unspecified part of unspecified bronchus or lung: Secondary | ICD-10-CM | POA: Diagnosis not present

## 2024-01-11 DIAGNOSIS — K5790 Diverticulosis of intestine, part unspecified, without perforation or abscess without bleeding: Secondary | ICD-10-CM | POA: Diagnosis not present

## 2024-01-11 DIAGNOSIS — K219 Gastro-esophageal reflux disease without esophagitis: Secondary | ICD-10-CM | POA: Diagnosis not present

## 2024-01-11 DIAGNOSIS — Z902 Acquired absence of lung [part of]: Secondary | ICD-10-CM | POA: Diagnosis not present

## 2024-01-19 DIAGNOSIS — F17210 Nicotine dependence, cigarettes, uncomplicated: Secondary | ICD-10-CM | POA: Diagnosis not present

## 2024-01-19 DIAGNOSIS — I700 Atherosclerosis of aorta: Secondary | ICD-10-CM | POA: Diagnosis not present

## 2024-01-19 DIAGNOSIS — I2510 Atherosclerotic heart disease of native coronary artery without angina pectoris: Secondary | ICD-10-CM | POA: Diagnosis not present

## 2024-01-19 DIAGNOSIS — J479 Bronchiectasis, uncomplicated: Secondary | ICD-10-CM | POA: Diagnosis not present

## 2024-01-19 DIAGNOSIS — F419 Anxiety disorder, unspecified: Secondary | ICD-10-CM | POA: Diagnosis not present

## 2024-01-19 DIAGNOSIS — Z85118 Personal history of other malignant neoplasm of bronchus and lung: Secondary | ICD-10-CM | POA: Diagnosis not present

## 2024-01-19 DIAGNOSIS — Z0000 Encounter for general adult medical examination without abnormal findings: Secondary | ICD-10-CM | POA: Diagnosis not present

## 2024-02-02 ENCOUNTER — Other Ambulatory Visit: Payer: Self-pay | Admitting: Acute Care

## 2024-02-02 DIAGNOSIS — Z87891 Personal history of nicotine dependence: Secondary | ICD-10-CM

## 2024-02-02 DIAGNOSIS — F17210 Nicotine dependence, cigarettes, uncomplicated: Secondary | ICD-10-CM

## 2024-02-02 DIAGNOSIS — Z122 Encounter for screening for malignant neoplasm of respiratory organs: Secondary | ICD-10-CM

## 2024-02-16 ENCOUNTER — Ambulatory Visit
Admission: RE | Admit: 2024-02-16 | Discharge: 2024-02-16 | Disposition: A | Discharge status: Home / Self Care | Source: Ambulatory Visit | Attending: Acute Care | Admitting: Acute Care

## 2024-02-16 DIAGNOSIS — Z87891 Personal history of nicotine dependence: Secondary | ICD-10-CM | POA: Insufficient documentation

## 2024-02-16 DIAGNOSIS — Z122 Encounter for screening for malignant neoplasm of respiratory organs: Secondary | ICD-10-CM | POA: Diagnosis not present

## 2024-02-16 DIAGNOSIS — F17210 Nicotine dependence, cigarettes, uncomplicated: Secondary | ICD-10-CM | POA: Diagnosis not present

## 2024-02-26 ENCOUNTER — Telehealth: Payer: Self-pay

## 2024-02-26 ENCOUNTER — Telehealth: Payer: Self-pay | Admitting: Acute Care

## 2024-02-26 DIAGNOSIS — R918 Other nonspecific abnormal finding of lung field: Secondary | ICD-10-CM

## 2024-02-26 NOTE — Telephone Encounter (Signed)
 I have attempted to call the patient with the results of their  Low Dose CT Chest Lung cancer screening scan. There was no answer. I have left a HIPPA compliant VM requesting the patient call the office for the scan results. I included the office contact information in the message. We will await his return call. If no return call we will continue to call until patient is contacted.    Pt.has an 11.7 mm lung nodule that has grown in size from  4 mm on 01/16/2023. He will need a PET scan and follow up with me or one of the [facility] based nodule docs 1-2 weeks after the scan. Thanks so much.

## 2024-02-26 NOTE — Telephone Encounter (Signed)
 See provider note 02/26/2024

## 2024-02-26 NOTE — Telephone Encounter (Signed)
 Call Report from Tiffany  IMPRESSION: 1. Lung-RADS 4B, suspicious. Additional imaging evaluation or consultation with Pulmonology or Thoracic Surgery recommended. Irregular solid anterior left upper lobe 11.7 mm pulmonary nodule is significantly increased from 4.0 mm on 01/16/2023 CT. Suggest PET-CT for further characterization at this time. 2. Two-vessel coronary atherosclerosis. 3. 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 Recommend attention on follow-up chest CT angiogram in 1 year. 4. 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 No pleural effusions. 5. Diffuse osteopenia. Chronic moderate T7 vertebral compression fracture status post vertebroplasty. 6. Aortic Atherosclerosis (ICD10-I70.0) and Emphysema (ICD10-J43.9).

## 2024-02-27 NOTE — Telephone Encounter (Signed)
 Attempted to reach patient and review results. LVM on patient and spouse's cell phone.

## 2024-02-27 NOTE — Telephone Encounter (Signed)
 Patient called back. Informed him of the results of the recent LDCT and the concern with the nodule growth. Patient aware of the concerning nodule and the need for a PET scan. PET ordered. Patient aware he needs an OV after PET to review results. Results and plan sent to PCP.

## 2024-02-29 ENCOUNTER — Telehealth: Payer: Self-pay

## 2024-02-29 NOTE — Telephone Encounter (Signed)
 Error

## 2024-03-04 ENCOUNTER — Ambulatory Visit: Admission: EM | Admit: 2024-03-04 | Discharge: 2024-03-04 | Disposition: A | Discharge status: Home / Self Care

## 2024-03-04 ENCOUNTER — Other Ambulatory Visit: Payer: Self-pay | Admitting: Pulmonary Disease

## 2024-03-04 ENCOUNTER — Ambulatory Visit: Admitting: Pulmonary Disease

## 2024-03-04 ENCOUNTER — Encounter: Payer: Self-pay | Admitting: Pulmonary Disease

## 2024-03-04 ENCOUNTER — Ambulatory Visit: Payer: Self-pay

## 2024-03-04 ENCOUNTER — Telehealth: Payer: Self-pay

## 2024-03-04 ENCOUNTER — Encounter: Payer: Self-pay | Admitting: Physician Assistant

## 2024-03-04 VITALS — BP 122/70 | HR 67 | Temp 98.2°F | Ht 66.0 in | Wt 133.4 lb

## 2024-03-04 DIAGNOSIS — J4489 Other specified chronic obstructive pulmonary disease: Secondary | ICD-10-CM

## 2024-03-04 DIAGNOSIS — R0902 Hypoxemia: Secondary | ICD-10-CM | POA: Diagnosis not present

## 2024-03-04 DIAGNOSIS — L02429 Furuncle of limb, unspecified: Secondary | ICD-10-CM | POA: Diagnosis not present

## 2024-03-04 DIAGNOSIS — J449 Chronic obstructive pulmonary disease, unspecified: Secondary | ICD-10-CM | POA: Diagnosis not present

## 2024-03-04 DIAGNOSIS — S32039A Unspecified fracture of third lumbar vertebra, initial encounter for closed fracture: Secondary | ICD-10-CM | POA: Insufficient documentation

## 2024-03-04 DIAGNOSIS — J439 Emphysema, unspecified: Secondary | ICD-10-CM

## 2024-03-04 DIAGNOSIS — C3490 Malignant neoplasm of unspecified part of unspecified bronchus or lung: Secondary | ICD-10-CM

## 2024-03-04 DIAGNOSIS — R911 Solitary pulmonary nodule: Secondary | ICD-10-CM | POA: Diagnosis not present

## 2024-03-04 DIAGNOSIS — C801 Malignant (primary) neoplasm, unspecified: Secondary | ICD-10-CM | POA: Insufficient documentation

## 2024-03-04 DIAGNOSIS — L0291 Cutaneous abscess, unspecified: Secondary | ICD-10-CM | POA: Diagnosis not present

## 2024-03-04 DIAGNOSIS — J441 Chronic obstructive pulmonary disease with (acute) exacerbation: Secondary | ICD-10-CM

## 2024-03-04 MED ORDER — IPRATROPIUM-ALBUTEROL 0.5-2.5 (3) MG/3ML IN SOLN
3.0000 mL | Freq: Once | RESPIRATORY_TRACT | Status: AC
  Administered 2024-03-04: 3 mL via RESPIRATORY_TRACT

## 2024-03-04 MED ORDER — MUPIROCIN 2 % EX OINT: 1.0000 | TOPICAL_OINTMENT | Freq: Two times a day (BID) | CUTANEOUS | 0 refills | Status: DC

## 2024-03-04 MED ORDER — DOXYCYCLINE HYCLATE 100 MG PO CAPS: 100.0000 mg | ORAL_CAPSULE | Freq: Two times a day (BID) | ORAL | 0 refills | Status: AC

## 2024-03-04 MED ORDER — BREZTRI AEROSPHERE 160-9-4.8 MCG/ACT IN AERO: 2.0000 | INHALATION_SPRAY | Freq: Two times a day (BID) | RESPIRATORY_TRACT | 0 refills | Status: AC

## 2024-03-04 NOTE — Addendum Note (Signed)
 Addended by: TAMEA DEDRA CROME on: 03/04/2024 11:46 AM   Modules accepted: Orders

## 2024-03-04 NOTE — Progress Notes (Signed)
 Patient to be seen as an acute visit at 11:30 AM.  Will obtain chest x-ray prior to visit.  Patient experiencing low oxygen levels.  KYM Leita Sanders, MD Advanced Bronchoscopy PCCM [facility]

## 2024-03-04 NOTE — ED Triage Notes (Addendum)
 I have this red area/boil/infected area on my right upper leg (front) that I mainly noticed yesterday due to soreness/swelling. No fever known.   Note: Noticed patient seems to be out of breath/sob during conversation, see v/s, provider notified.

## 2024-03-04 NOTE — Patient Instructions (Addendum)
 VISIT SUMMARY:  Today, we addressed your COPD exacerbation, a new lung nodule, ongoing tobacco use, and a painful boil on your leg. We discussed your symptoms, reviewed your recent CT scan, and planned further evaluations and treatments.  YOUR PLAN:  -COPD EXACERBATION: COPD exacerbation means a worsening of your chronic obstructive pulmonary disease symptoms.  At urgent care it was reported that your oxygen level dropped to 88%, we were not able to replicate this during your visit.  Your baseline oxygen level here was 94% and during a walk test you did not drop below 91%.  We will arrange pulmonary function tests and conduct a walking test to assess your oxygen levels during exertion. It's important to consider quitting smoking to help manage your COPD.  We are giving you samples of Breztri  which is an inhaler that is 2 puffs twice a day, make sure you rinse your mouth well after you use it.  -NODULE ON LEFT UPPER LUNG LOBE: A nodule on your left upper lung lobe has increased in size, raising concerns about potential malignancy. You are scheduled for a PET scan on August 18 to further evaluate this nodule. Depending on the results, we may need to perform a bronchoscopy for a biopsy and consider targeted radiation therapy if surgery is not an option.  I suspect that you will need a robotic bronchoscopy.  -TOBACCO USE DISORDER: Tobacco use disorder refers to your ongoing smoking habit. Quitting smoking is strongly encouraged to improve your overall health and manage your COPD.  -BOIL ON LEG: A boil is a painful, pus-filled bump under the skin. You are currently taking doxycycline  and should continue this medication. Apply warm compresses to the boil and use Bactroban  cream twice daily. Avoid squeezing or picking at the boil, and monitor for any signs of a pus pocket, which may require further treatment.  INSTRUCTIONS:  Please follow up with Lauraine Lites for your pulmonary function tests and to discuss  the results of your PET scan. Continue taking your medications as prescribed and apply warm compresses to your boil. Consider quitting smoking to help manage your COPD.

## 2024-03-04 NOTE — ED Notes (Signed)
 Placed on 2L Oxygen (per standing order/provider) after 1st treatment. Monitoring patient.

## 2024-03-04 NOTE — Progress Notes (Addendum)
 Subjective:    Patient ID: Stephen Leon, male    DOB: October 07, 1945, 78 y.o.   MRN: [id number]  Patient Care Team: [name], MD as PCP - General (Internal Medicine)  Chief Complaint  Patient presents with   Acute Visit    Low O2 noted at [facility] visit. Not currently having any new symptoms -- has a usual cough and is feeling kind of horse. Wife notes that he is tired a lot and canbe SOB/DOE. Was previously on Trelegy but had to discontinue due to cost.    BACKGROUND: Patient is a 78 year old current smoker who presents as an acute visit for evaluation of low oxygen levels noted at urgent care earlier today.  Patient's primary pulmonologist is Dr. Belva November, last seen the patient on 20 April 2023.  Patient has known COPD.   HPI Discussed the use of AI scribe software for clinical note transcription with the patient, who gave verbal consent to proceed.  History of Present Illness   Stephen Leon is a 78 year old male with COPD who presents for evaluation of low oxygen saturations noted at urgent care.  He presents with his wife, Meade.  He went to urgent care today to be evaluated for a boil on his right leg.  Evaluation at urgent care showed that he had an oxygen level of 88%, prompting the initiation of supplemental oxygen during the visit. No increased shortness of breath beyond his usual baseline is reported.  He does not endorse any fevers, chills or sweats.  Cough is nonproductive mostly occurs at nighttime.  No hemoptysis.  No chest pain.  No lower extremity edema.  He has discontinued Trelegy due to cost, which he previously received for free and found beneficial. He continues to smoke about half a pack of cigarettes per day, having started smoking at age 58.  He has a history of lung cancer with a robotic right upper lobe resection performed 17 May 2012 at [facility].  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 A recent lung cancer screening chest CT scan revealed a new nodule on the left upper lobe, and he is scheduled for a PET scan on 30 July.  This lesion is 11.7 mm, previously this measured 4 mm on a June 2024 CT.  He has a follow-up appointment with Lauraine Lites, NP in [place] to follow-up on findings of the PET/CT.  He is currently experiencing a painful boil, for which doxycycline  has been prescribed. The boil is very sore.  It is located on his anterior right thigh.  He has a cough that is sometimes dry and sometimes productive of white sputum. Increased coughing occurs when lying down at night.  He uses albuterol  rarely.     DATA 09/07/2017 PFTs [facility]): FEV1 1.86 L or 63% predicted, FVC 4.15 L or 111% predicted, FEV1/FVC was 45%.  Consistent with moderate to severe obstruction. 12/17/2019 PFTs [facility]): FEV1 1.70 L or 59% predicted, FVC 3.41 L or 93% of predicted, FEV1/FVC 50%, no bronchodilator response.  TLC 85% predicted, RV 70% predicted, DLCO 72% predicted, DLCO/VA was 81% of predicted.  Consistent with moderate to severe obstruction no significant bronchodilator effect.  Lung volumes within normal range.  Diffusion capacity mildly reduced but corrects to alveolar volume. 02/16/2024 LDCT chest: Irregular solid anterior upper lobe 11.7 in June 2024.  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  Diffuse osteopenia.  Coronary atherosclerosis.  Emphysema.  Overall lung RADS 4B, suspicious.  Review of Systems A 10 point review of systems was performed and it is as noted above otherwise negative.   Past Medical History:  Diagnosis Date   Anxiety    Bronchitis    Cancer (HCC) 2016   COPD (chronic obstructive pulmonary disease) (HCC)    Cough 07/13/2007   Qualifier: Diagnosis of  By: Georgian ROSALEA CHARM Lamar    Dyspnea    due to copd   Hypertension    pt denies this and is not on bp meds but  vascular note states htn   LUNG NODULE 07/13/2007   Qualifier: Diagnosis of  By: Georgian ROSALEA CHARM Lamar    Pneumonia 2013   Thoracic aortic aneurysm [facility])    followed by vascular    Past Surgical History:  Procedure Laterality Date   BIOPSY  07/13/2023   Procedure: BIOPSY;  Surgeon: Onita Elspeth Sharper, DO;  Location: [facility] ENDOSCOPY;  Service: Gastroenterology;;   COLONOSCOPY N/A 10/27/2022   Procedure: COLONOSCOPY;  Surgeon: Onita Elspeth Sharper, DO;  Location: [facility] ENDOSCOPY;  Service: Gastroenterology;  Laterality: N/A;   COLONOSCOPY WITH PROPOFOL  N/A 07/13/2023   Procedure: COLONOSCOPY WITH PROPOFOL ;  Surgeon: Onita Elspeth Sharper, DO;  Location: [facility] ENDOSCOPY;  Service: Gastroenterology;  Laterality: N/A;   CORONARY PRESSURE/FFR STUDY N/A 05/19/2020   Procedure: INTRAVASCULAR PRESSURE WIRE/FFR STUDY;  Surgeon: Ammon Blunt, MD;  Location: [facility] INVASIVE CV LAB;  Service: Cardiovascular;  Laterality: N/A;   DIAGNOSTIC LAPAROSCOPY     ESOPHAGOGASTRODUODENOSCOPY N/A 10/27/2022   Procedure: ESOPHAGOGASTRODUODENOSCOPY (EGD);  Surgeon: Onita Elspeth Sharper, DO;  Location: [facility] ENDOSCOPY;  Service: Gastroenterology;  Laterality: N/A;   EYE SURGERY Bilateral    HERNIA REPAIR     KYPHOPLASTY N/A 12/13/2018   Procedure: KYPHOPLASTY- T7;  Surgeon: Kathlynn Sharper, MD;  Location: [facility] ORS;  Service: Orthopedics;  Laterality: N/A;   LEFT HEART CATH N/A 05/11/2022   Procedure: Left Heart Cath;  Surgeon: Ammon Blunt, MD;  Location: [facility] INVASIVE CV LAB;  Service: Cardiovascular;  Laterality: N/A;   LEFT HEART CATH AND CORONARY ANGIOGRAPHY Left 05/19/2020   Procedure: LEFT HEART CATH AND CORONARY ANGIOGRAPHY;  Surgeon: Ammon Blunt, MD;  Location: [facility] INVASIVE CV LAB;  Service: Cardiovascular;  Laterality: Left;   Robotic assisted Right upper lobectomy Right 05/17/2012   [facility]   XI ROBOTIC ASSISTED INGUINAL HERNIA REPAIR WITH MESH Right  07/19/2019   Procedure: XI ROBOTIC ASSISTED INGUINAL HERNIA REPAIR WITH MESH;  Surgeon: Tye Millet, DO;  Location: [facility] ORS;  Service: General;  Laterality: Right;    Patient Active Problem List   Diagnosis Date Noted   Fracture of third lumbar vertebra (HCC) 03/04/2024   Cancer (HCC) 03/04/2024   Age-related osteoporosis without current pathological fracture 10/02/2023   Closed compression fracture of L3 lumbar vertebra, initial encounter (HCC) 08/21/2023   Allergic drug rash 08/29/2022   History of cardiac cath 05/23/2022   Chest pressure 05/10/2022   NSTEMI (non-ST elevated myocardial infarction) (HCC) 05/10/2022   Coronary artery disease involving native coronary artery of native heart without angina pectoris 01/20/2021   Aortic atherosclerosis (HCC) 09/22/2020   SOB (shortness of breath) on exertion 03/16/2020   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   Aneurysm of thoracic aorta (HCC) 09/23/2017   Cancer associated pain 08/14/2017   Chronic pain syndrome 07/11/2016   History of narcotic addiction (HCC) 07/11/2016   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) (T10 & T11  07/11/2016   GAD (generalized anxiety disorder) 12/15/2015   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   COPD (chronic obstructive pulmonary disease) (HCC) 09/28/2015   Malignant neoplastic disease (HCC) 09/28/2015   Chest pain with high risk for cardiac etiology 09/28/2015   Acute bronchitis with bronchospasm 08/28/2015   Essential (primary) hypertension 06/16/2015   Peripheral neuropathic pain 01/14/2015   S/P lobectomy of lung 06/29/2012   Cancer of lung (HCC) 05/16/2012   Tobacco user 05/04/2012   Non-small cell carcinoma of lung (HCC) 05/04/2012   Non-small cell lung cancer (HCC) 05/04/2012   Tobacco abuse 05/04/2012    Family History  Problem Relation Age of Onset   COPD Mother     Social History   Tobacco Use   Smoking status:  Every Day    Current packs/day: 0.50    Average packs/day: 0.5 packs/day for 60.0 years (30.0 ttl pk-yrs)    Types: Cigarettes   Smokeless tobacco: Never   Tobacco comments:    5-10cigarettes daily- 03/04/24  Substance Use Topics   Alcohol  use: Yes    Alcohol /week: 12.0 standard drinks of alcohol     Types: 12 Standard drinks or equivalent per week    Comment: Drinks 3-4 beers every night    Allergies  Allergen Reactions   Codeine Nausea And Vomiting and Other (See Comments)    Current Meds  Medication Sig   albuterol  (VENTOLIN  HFA) 108 (90 Base) MCG/ACT inhaler Inhale 2 puffs into the lungs every 6 (six) hours as needed.   aspirin  EC 81 MG tablet Take 81 mg by mouth daily. Swallow whole.   atorvastatin  (LIPITOR ) 80 MG tablet Take 80 mg by mouth daily.   budesonide-glycopyrrolate -formoterol (BREZTRI  AEROSPHERE) 160-9-4.8 MCG/ACT AERO inhaler Inhale 2 puffs into the lungs in the morning and at bedtime.   chlorpheniramine-HYDROcodone  (TUSSIONEX) 10-8 MG/5ML Take 5 mLs by mouth at bedtime as needed.   clopidogrel  (PLAVIX ) 75 MG tablet Take 1 tablet (75 mg total) by mouth daily.   doxycycline  (VIBRAMYCIN ) 100 MG capsule Take 1 capsule (100 mg total) by mouth 2 (two) times daily for 7 days.   fentaNYL  (DURAGESIC ) 50 MCG/HR Place 1 patch onto the skin every 3 (three) days.   gabapentin  (NEURONTIN ) 800 MG tablet Take 800 mg by mouth 2 (two) times daily.    HYDROcodone -acetaminophen  (NORCO/VICODIN) 5-325 MG tablet Take 1 tablet by mouth every 12 (twelve) hours as needed for moderate pain (pain score 4-6).   ibuprofen  (ADVIL ) 800 MG tablet Take 800 mg by mouth every 8 (eight) hours as needed for mild pain.   ipratropium-albuterol  (DUONEB) 0.5-2.5 (3) MG/3ML SOLN Inhale 3 mLs into the lungs every 4 (four) hours as needed (When he get sick and the doctor tells him to take it).   isosorbide  mononitrate (IMDUR ) 30 MG 24 hr tablet Take 1 tablet (30 mg total) by mouth daily.   LORazepam  (ATIVAN ) 0.5  MG tablet Take 0.5 mg by mouth every 6 (six) hours as needed.   metoprolol  succinate (TOPROL -XL) 25 MG 24 hr tablet Take 1 tablet (25 mg total) by mouth daily.   mupirocin  ointment (BACTROBAN ) 2 % Apply 1 Application topically 2 (two) times daily.   nicotine  polacrilex (NICORETTE ) 2 MG gum Take 1 each (2 mg total) by mouth as needed for smoking cessation.   omeprazole (PRILOSEC) 40 MG capsule Take 40 mg by mouth daily.   oxycodone  (OXY-IR) 5 MG capsule Take 5 mg by mouth every 4 (four) hours as needed.   predniSONE (DELTASONE)  10 MG tablet Take 10 mg by mouth.   sodium chloride , hypertonic, 3 % solution Inject into the vein continuous.   tamsulosin (FLOMAX) 0.4 MG CAPS capsule Take 0.4 mg by mouth at bedtime.   [DISCONTINUED] TRELEGY ELLIPTA 100-62.5-25 MCG/INH AEPB Inhale 1 puff into the lungs daily.    Immunization History  Administered Date(s) Administered   Fluad Quad(high Dose 65+) 04/28/2022   Fluad Trivalent(High Dose 65+) 04/20/2023   Influenza Inj Mdck Quad Pf 06/16/2016, 06/01/2021   Influenza Split 05/19/2012, 06/16/2015   Influenza Whole 07/13/2007   Influenza, High Dose Seasonal PF 05/16/2017, 04/21/2018   Influenza,inj,Quad PF,6+ Mos 04/17/2019, 05/18/2020   Influenza-Unspecified 05/19/2012, 06/08/2013, 05/12/2017, 06/01/2018   PFIZER Comirnaty(Gray Top)Covid-19 Tri-Sucrose Vaccine 08/27/2019, 09/17/2019, 06/11/2020   Pfizer(Comirnaty)Fall Seasonal Vaccine 12 years and older 04/28/2022   Pneumococcal Conjugate-13 01/21/2021   Pneumococcal Polysaccharide-23 07/13/2007, 05/19/2012, 05/16/2017   Tdap 06/01/2021        Objective:     BP 122/70 (BP Location: Left Arm, Patient Position: Sitting, Cuff Size: Normal)   Pulse 67   Temp 98.2 F (36.8 C) (Oral)   Ht 5' 6 (1.676 m)   Wt 133 lb 6.4 oz (60.5 kg)   SpO2 94%   BMI 21.53 kg/m   SpO2: 94 %  GENERAL: Well-developed, well-nourished gentleman, no acute distress.  Fully ambulatory, no conversational  dyspnea. HEAD: Normocephalic, atraumatic.  EYES: Pupils equal, round, reactive to light.  No scleral icterus.  MOUTH: Poor dentition, oral mucosa moist.  No thrush. NECK: Supple. No thyromegaly. Trachea midline. No JVD.  No adenopathy. PULMONARY: Good air entry bilaterally.  Coarse, otherwise, no adventitious sounds. CARDIOVASCULAR: S1 and S2. Regular rate and rhythm.  No rubs, murmurs or gallops heard. ABDOMEN: Benign. MUSCULOSKELETAL: No joint deformity, no clubbing, no edema.  NEUROLOGIC: No overt focal deficit, no gait disturbance, speech is fluent. SKIN: Intact,warm,dry.  Boil/furuncle on the right thigh anteriorly, mild erythema and induration.  No fluctuance. PSYCH: Mood and behavior normal  Ambulatory oxymetry was performed today:  At rest on room air oxygen saturation was 94%, the patient ambulated at a normal pace, completed 3 laps, O2 nadir 91%, no shortness of breath.  Resting heart rate was 67 bpm at maximum for this exercise 82 bpm.  Note: Nadir was 91% however with continued ambulation O2 rose again to 93% and remained there.  No evidence of clinically significant oxygen desaturations noted.  Representative images from CT performed 16 February 2024 showing the left upper lobe nodule measuring 11.7 mm (arrow)  Assessment & Plan:     ICD-10-CM   1. COPD with chronic bronchitis and emphysema (HCC)  J44.89 Pulmonary function test   J43.9     2. Lung nodule  R91.1 Pulmonary function test    3. Boil of lower extremity - right thigh  L02.429       Orders Placed This Encounter  Procedures   Pulmonary function test    Standing Status:   Future    Expiration Date:   03/04/2025    Scheduling Instructions:     [address], has appt with Lauraine Lites 8/18    Where should this test be performed?:   Outpatient Pulmonary    What type of PFT is being ordered?:   Full PFT    Meds ordered this encounter  Medications   budesonide-glycopyrrolate -formoterol (BREZTRI  AEROSPHERE)  160-9-4.8 MCG/ACT AERO inhaler    Sig: Inhale 2 puffs into the lungs in the morning and at bedtime.    Dispense:  11.8 g    Refill:  0    Lot Number?:   [id number]    Expiration Date?:   06/08/2026    Manufacturer?:   [employer] [[id number]]    Quantity:   2   mupirocin  ointment (BACTROBAN ) 2 %    Sig: Apply 1 Application topically 2 (two) times daily.    Dispense:  22 g    Refill:  0   Discussion:    COPD poorly compensated, no overt exacerbation COPD poorly compensated with reported oxygen saturation drop to 88% at urgent care. No significant increase in dyspnea or cough beyond baseline. Currently not on Trelegy due to cost. Lung examination reveals coarse breath sounds. - Provide inhaler samples for immediate use (Breztri , 2 puffs twice a day) - Discussed with Lauraine Ruthell PIETY she will follow-up with the patient on August 18 - Conduct walking test to assess oxygen levels during exertion: No clinically significant oxygen desaturations noted - Encourage smoking cessation  Nodule on left upper lung lobe Nodule on left upper lung lobe noted on recent CT scan, with increase in size compared to previous year. High suspicion for malignancy given history and imaging findings. PET scan scheduled to further evaluate the nodule. Discussed potential need for robotic bronchoscopy for biopsy if PET scan indicates concern. SBRT considered if malignancy is confirmed and surgery is not feasible due to lung function (suspect he will not be a surgical candidate). - Proceed with scheduled PET scan on August 18 - Discuss results with Lauraine Ruthell and coordinate potential bronchoscopy - Consider SBRT if malignancy is confirmed and surgery is not feasible due to lung function  Tobacco use disorder Long-standing tobacco use disorder, currently smoking half a pack per day. Acknowledged difficulty in quitting due to long history of smoking since age 72. - Encourage smoking cessation - Patient counseled regards to  discontinuation of smoking total counseling time 3 to 5 minutes  Boil on anterior right thigh  Boil on anterior right thigh.  Currently on doxycycline . Warm compresses recommended. Advised against squeezing or picking at the boil. - Continue doxycycline  as prescribed - Apply warm compresses to the boil - Prescribe Bactroban  (mupirocin ) ointment for topical application twice daily - Monitor for development of pus pocket and consider incision and drainage if necessary    Advised if symptoms do not improve or worsen, to please contact office for sooner follow up or seek emergency care.    I spent 45 minutes of dedicated to the care of this patient on the date of this encounter to include pre-visit review of records, face-to-face time with the patient discussing conditions above, post visit ordering of testing, clinical documentation with the electronic health record, making appropriate referrals as documented, and communicating necessary findings to members of the patients care team.   C. Leita Sanders, MD for Advanced Bronchoscopy PCCM [facility]    *This note was dictated using voice recognition software/Dragon.  Despite best efforts to proofread, errors can occur which can change the meaning. Any transcriptional errors that result from this process are unintentional and may not be fully corrected at the time of dictation.

## 2024-03-04 NOTE — Telephone Encounter (Signed)
 We see this guy 8/18. He is having a PET. He was seen by Tamea. If he likes Breztri  he will need financial assistance papers. He also will let us  know if he wants bronch in [place] or [place]. Just a reminder to remind me on 7/18. Thanks so much

## 2024-03-04 NOTE — Telephone Encounter (Signed)
Noted, nothing further needed.  

## 2024-03-04 NOTE — ED Provider Notes (Signed)
 [facility]    CSN: [id number] Arrival date & time: 03/04/24  0813      History   Chief Complaint Chief Complaint  Patient presents with   Skin Problem    HPI Stephen Leon is a 78 y.o. male.   Patient presents to urgent care today for evaluation of an abscess to his right anterior upper leg.  He reports that he first noticed it a few days ago and that the area is now draining due to friction of his pants.  He notes some pain to the area.  He does not report fever.  He is unsure what caused symptoms.  Patient is notably hypoxic in office.  He does have known underlying COPD as well as possible recurrent lung cancer.  Recently had CT with nodule to the left upper lobe with significant growth since last scan.  He is due for PET scan next week.  He has had prior lobectomy of right upper lung.  He does not report any additional cough or congestion.  He has not had any additional shortness of breath or other than that at baseline.  The history is provided by the patient.    Past Medical History:  Diagnosis Date   Anxiety    Bronchitis    Cancer (HCC) 2016   COPD (chronic obstructive pulmonary disease) (HCC)    Cough 07/13/2007   Qualifier: Diagnosis of  By: Georgian ROSALEA CHARM Lamar    Dyspnea    due to copd   Hypertension    pt denies this and is not on bp meds but vascular note states htn   LUNG NODULE 07/13/2007   Qualifier: Diagnosis of  By: Georgian ROSALEA CHARM Lamar    Pneumonia 2013   Thoracic aortic aneurysm [facility])    followed by vascular    Patient Active Problem List   Diagnosis Date Noted   Fracture of third lumbar vertebra (HCC) 03/04/2024   Cancer (HCC) 03/04/2024   Age-related osteoporosis without current pathological fracture 10/02/2023   Closed compression fracture of L3 lumbar vertebra, initial encounter (HCC) 08/21/2023   Allergic drug rash 08/29/2022   History of cardiac cath 05/23/2022   Chest pressure 05/10/2022   NSTEMI (non-ST elevated  myocardial infarction) (HCC) 05/10/2022   Coronary artery disease involving native coronary artery of native heart without angina pectoris 01/20/2021   Aortic atherosclerosis (HCC) 09/22/2020   SOB (shortness of breath) on exertion 03/16/2020   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   Aneurysm of thoracic aorta (HCC) 09/23/2017   Cancer associated pain 08/14/2017   Chronic pain syndrome 07/11/2016   History of narcotic addiction (HCC) 07/11/2016   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) (T10 & T11 07/11/2016   GAD (generalized anxiety disorder) 12/15/2015   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   COPD (chronic obstructive pulmonary disease) (HCC) 09/28/2015   Malignant neoplastic disease (HCC) 09/28/2015   Chest pain with high risk for cardiac etiology 09/28/2015   Acute bronchitis with bronchospasm 08/28/2015   Essential (primary) hypertension 06/16/2015   Peripheral neuropathic pain 01/14/2015   S/P lobectomy of lung 06/29/2012   Cancer of lung (HCC) 05/16/2012   Tobacco user 05/04/2012   Non-small cell carcinoma of lung (HCC) 05/04/2012   Non-small cell lung cancer (HCC) 05/04/2012   Tobacco abuse 05/04/2012    Past Surgical History:  Procedure Laterality Date   BIOPSY  07/13/2023   Procedure: BIOPSY;  Surgeon: Onita Elspeth Sharper, DO;  Location: [facility] ENDOSCOPY;  Service: Gastroenterology;;   COLONOSCOPY N/A 10/27/2022   Procedure: COLONOSCOPY;  Surgeon: Onita Elspeth Sharper, DO;  Location: [facility] ENDOSCOPY;  Service: Gastroenterology;  Laterality: N/A;   COLONOSCOPY WITH PROPOFOL  N/A 07/13/2023   Procedure: COLONOSCOPY WITH PROPOFOL ;  Surgeon: Onita Elspeth Sharper, DO;  Location: [facility] ENDOSCOPY;  Service: Gastroenterology;  Laterality: N/A;   CORONARY PRESSURE/FFR STUDY N/A 05/19/2020   Procedure: INTRAVASCULAR PRESSURE WIRE/FFR STUDY;  Surgeon: Ammon Blunt, MD;  Location: [facility] INVASIVE CV LAB;   Service: Cardiovascular;  Laterality: N/A;   DIAGNOSTIC LAPAROSCOPY     ESOPHAGOGASTRODUODENOSCOPY N/A 10/27/2022   Procedure: ESOPHAGOGASTRODUODENOSCOPY (EGD);  Surgeon: Onita Elspeth Sharper, DO;  Location: [facility] ENDOSCOPY;  Service: Gastroenterology;  Laterality: N/A;   EYE SURGERY Bilateral    HERNIA REPAIR     KYPHOPLASTY N/A 12/13/2018   Procedure: KYPHOPLASTY- T7;  Surgeon: Kathlynn Sharper, MD;  Location: [facility] ORS;  Service: Orthopedics;  Laterality: N/A;   LEFT HEART CATH N/A 05/11/2022   Procedure: Left Heart Cath;  Surgeon: Ammon Blunt, MD;  Location: [facility] INVASIVE CV LAB;  Service: Cardiovascular;  Laterality: N/A;   LEFT HEART CATH AND CORONARY ANGIOGRAPHY Left 05/19/2020   Procedure: LEFT HEART CATH AND CORONARY ANGIOGRAPHY;  Surgeon: Ammon Blunt, MD;  Location: [facility] INVASIVE CV LAB;  Service: Cardiovascular;  Laterality: Left;   Robotic assisted Right upper lobectomy Right 05/17/2012   [facility]   XI ROBOTIC ASSISTED INGUINAL HERNIA REPAIR WITH MESH Right 07/19/2019   Procedure: XI ROBOTIC ASSISTED INGUINAL HERNIA REPAIR WITH MESH;  Surgeon: Tye Millet, DO;  Location: [facility] ORS;  Service: General;  Laterality: Right;       Home Medications    Prior to Admission medications   Medication Sig Start Date End Date Taking? Authorizing Provider  albuterol  (VENTOLIN  HFA) 108 (90 Base) MCG/ACT inhaler Inhale 2 puffs into the lungs every 6 (six) hours as needed. 12/15/15  Yes [provider]  aspirin  EC 81 MG tablet Take 81 mg by mouth daily. Swallow whole.   Yes [provider]  doxycycline  (VIBRAMYCIN ) 100 MG capsule Take 1 capsule (100 mg total) by mouth 2 (two) times daily for 7 days. 03/04/24 03/11/24 Yes Billy Asberry FALCON, PA-C  fentaNYL  (DURAGESIC ) 50 MCG/HR Place 1 patch onto the skin every 3 (three) days. 06/29/12  Yes [provider]  gabapentin  (NEURONTIN ) 800 MG tablet Take 800 mg by mouth 2 (two) times daily.   03/09/20  Yes [provider]  ibuprofen  (ADVIL ) 800 MG tablet Take 800 mg by mouth every 8 (eight) hours as needed for mild pain.   Yes [provider]  LORazepam  (ATIVAN ) 0.5 MG tablet Take 0.5 mg by mouth every 6 (six) hours as needed. 08/31/12  Yes [provider]  oxycodone  (OXY-IR) 5 MG capsule Take 5 mg by mouth every 4 (four) hours as needed. 08/31/12  Yes [provider]  predniSONE (DELTASONE) 10 MG tablet Take 10 mg by mouth. 01/09/24  Yes [provider]  tamsulosin (FLOMAX) 0.4 MG CAPS capsule Take 0.4 mg by mouth at bedtime. 03/24/20  Yes [provider]  atorvastatin  (LIPITOR ) 80 MG tablet Take 80 mg by mouth daily.    [provider]  budesonide-glycopyrrolate -formoterol (BREZTRI  AEROSPHERE) 160-9-4.8 MCG/ACT AERO inhaler Inhale 2 puffs into the lungs in the morning and at bedtime. 03/04/24   Tamea Dedra CROME, MD  chlorpheniramine-HYDROcodone  (TUSSIONEX) 10-8 MG/5ML Take 5 mLs by mouth at bedtime as needed. 08/21/23   Barbarann Oneil BROCKS,  MD  clopidogrel  (PLAVIX ) 75 MG tablet Take 1 tablet (75 mg total) by mouth daily. 05/12/22   Alexander, Natalie, DO  HYDROcodone -acetaminophen  (NORCO/VICODIN) 5-325 MG tablet Take 1 tablet by mouth every 12 (twelve) hours as needed for moderate pain (pain score 4-6). 09/12/23   Barbarann Oneil BROCKS, MD  ipratropium-albuterol  (DUONEB) 0.5-2.5 (3) MG/3ML SOLN Inhale 3 mLs into the lungs every 4 (four) hours as needed (When he get sick and the doctor tells him to take it). 06/29/18   [provider]  isosorbide  mononitrate (IMDUR ) 30 MG 24 hr tablet Take 1 tablet (30 mg total) by mouth daily. 05/12/22   Alexander, Natalie, DO  metoprolol  succinate (TOPROL -XL) 25 MG 24 hr tablet Take 1 tablet (25 mg total) by mouth daily. 05/12/22   Alexander, Natalie, DO  mupirocin  ointment (BACTROBAN ) 2 % Apply 1 Application topically 2 (two) times daily. 03/04/24   Tamea Dedra CROME, MD  nicotine  polacrilex (NICORETTE ) 2  MG gum Take 1 each (2 mg total) by mouth as needed for smoking cessation. 05/12/22   Alexander, Natalie, DO  omeprazole (PRILOSEC) 40 MG capsule Take 40 mg by mouth daily.    [provider]  sodium chloride , hypertonic, 3 % solution Inject into the vein continuous.    [provider]    Family History Family History  Problem Relation Age of Onset   COPD Mother     Social History Social History   Tobacco Use   Smoking status: Every Day    Current packs/day: 0.50    Average packs/day: 0.5 packs/day for 60.0 years (30.0 ttl pk-yrs)    Types: Cigarettes   Smokeless tobacco: Never   Tobacco comments:    5-10cigarettes daily- 03/04/24  Vaping Use   Vaping status: Never Used  Substance Use Topics   Alcohol  use: Yes    Alcohol /week: 12.0 standard drinks of alcohol     Types: 12 Standard drinks or equivalent per week    Comment: Drinks 3-4 beers every night   Drug use: No     Allergies   Codeine   Review of Systems Review of Systems  Constitutional:  Negative for chills and fever.  Eyes:  Negative for discharge and redness.  Respiratory:  Negative for cough and shortness of breath.   Skin:  Positive for color change and wound.  Neurological:  Negative for numbness.     Physical Exam Triage Vital Signs ED Triage Vitals  Encounter Vitals Group     BP      Girls Systolic BP Percentile      Girls Diastolic BP Percentile      Boys Systolic BP Percentile      Boys Diastolic BP Percentile      Pulse      Resp      Temp      Temp src      SpO2      Weight      Height      Head Circumference      Peak Flow      Pain Score      Pain Loc      Pain Education      Exclude from Growth Chart    No data found.  Updated Vital Signs BP 124/75 (BP Location: Left Arm)   Pulse 68   Temp 97.9 F (36.6 C) (Oral)   Resp (!) 22   Ht 5' 6 (1.676 m)   Wt 135 lb 12.9 oz (61.6 kg)  SpO2 91%   BMI 21.92 kg/m   Visual Acuity Right Eye Distance:   Left  Eye Distance:   Bilateral Distance:    Right Eye Near:   Left Eye Near:    Bilateral Near:     Physical Exam Vitals and nursing note reviewed.  Constitutional:      General: He is not in acute distress.    Appearance: Normal appearance. He is not ill-appearing.  HENT:     Head: Normocephalic and atraumatic.  Eyes:     Conjunctiva/sclera: Conjunctivae normal.  Cardiovascular:     Rate and Rhythm: Normal rate and regular rhythm.  Pulmonary:     Effort: Pulmonary effort is normal. No respiratory distress.     Breath sounds: No wheezing, rhonchi or rales.     Comments: Patient with some shortness of breath noted in conversation Skin:    Comments: Approx 3 cm area of induration and erythema to right upper anterior thigh with central wound  Neurological:     Mental Status: He is alert.  Psychiatric:        Mood and Affect: Mood normal.        Behavior: Behavior normal.        Thought Content: Thought content normal.      UC Treatments / Results  Labs (all labs ordered are listed, but only abnormal results are displayed) Labs Reviewed - No data to display  EKG   Radiology No results found.  Procedures Procedures (including critical care time)  Medications Ordered in UC Medications  ipratropium-albuterol  (DUONEB) 0.5-2.5 (3) MG/3ML nebulizer solution 3 mL (3 mLs Nebulization Given 03/04/24 [id number])    Initial Impression / Assessment and Plan / UC Course  I have reviewed the triage vital signs and the nursing notes.  Pertinent labs & imaging results that were available during my care of the patient were reviewed by me and considered in my medical decision making (see chart for details).    Will treat to cover suspected abscess with doxycycline . Discussed concerns for hypoxia. I suspect that patient may need full time oxygen supplementation. Unable to obtain appointment with primary care provider, Dr. Sadie, but was able to obtain appointment with [facility] a  few hours following discharge from UC. Patient is agreeable to be seen by specialist today and wife is to drive him.   Final Clinical Impressions(s) / UC Diagnoses   Final diagnoses:  Abscess  Hypoxia  Chronic obstructive pulmonary disease, unspecified COPD type (HCC)  Non-small cell lung cancer, unspecified laterality [facility])   Discharge Instructions   None    ED Prescriptions     Medication Sig Dispense Auth. Provider   doxycycline  (VIBRAMYCIN ) 100 MG capsule Take 1 capsule (100 mg total) by mouth 2 (two) times daily for 7 days. 14 capsule Billy Asberry FALCON, PA-C      PDMP not reviewed this encounter.   Billy Asberry FALCON, PA-C 03/04/24 1910

## 2024-03-04 NOTE — Telephone Encounter (Signed)
 FYI Only or Action Required?: FYI only for provider.  Patient is followed in Pulmonology for Lung Cancer, last seen on 04/20/2023 by Isadora Hose, MD.  [facility] Nurse Triage reporting low oxygen level.  Symptoms began today. PA from Urgent Care concerned that patient is needing oxygen throughout the day  Interventions attempted: Nothing.  Symptoms are: unchanged.  Triage Disposition: Call PCP Now-appointment scheduled today with pulmonary  Patient/caregiver understands and will follow disposition?: Yes  Copied from CRM #[id number]. Topic: Clinical - Red Word Triage >> Mar 04, 2024  9:32 AM Isabell A wrote: Red Word that prompted transfer to Nurse Triage: Zebbie Ace with Urgent care from [facility] health states patient seen for infection on his leg but oxygen has been below 90 - no complaints of SOB, but he is going to need oxygen through out the day. Requesting to speak with a nurse while patient is there.   Callback number: [phone] Reason for Disposition  Oxygen level (e.g., pulse oximetry) 91 to 94%  Answer Assessment - Initial Assessment Questions 1. MAIN CONCERN OR SYMPTOM: What's your main concern? (e.g., low oxygen level, breathing difficulty) What question do you have?     Found to have an oxygen level below 90 while in urgent care. PA from Urgent care called with information.  2. ONSET: When did the  low oxygen  start?      Found today but patient reports no symptoms.  3. OXYGEN THERAPY:      Room air  Patient was in Urgent care today for a wound infection. Patient had a finding of oxygen saturation level of below 90. Patient reports no SOB or cough. PA with Urgent care called due to patient potentially needing oxygen throughout the day. PA reports speaking with PCP first who refused to see patient stating he needs to see pulmonary. Message pulmonary who stated appointment should be made for patient to be seen today.  Protocols used: Oxygen Monitoring and Hypoxia-A-AH

## 2024-03-06 ENCOUNTER — Ambulatory Visit (HOSPITAL_COMMUNITY)
Admission: RE | Admit: 2024-03-06 | Discharge: 2024-03-06 | Disposition: A | Discharge status: Home / Self Care | Source: Ambulatory Visit | Attending: Acute Care | Admitting: Acute Care

## 2024-03-06 DIAGNOSIS — R918 Other nonspecific abnormal finding of lung field: Secondary | ICD-10-CM | POA: Insufficient documentation

## 2024-03-06 LAB — GLUCOSE, CAPILLARY: Glucose-Capillary: 106 mg/dL — ABNORMAL HIGH (ref 70–99)

## 2024-03-06 MED ORDER — FLUDEOXYGLUCOSE F - 18 (FDG) INJECTION
6.5000 | Freq: Once | INTRAVENOUS | Status: AC | PRN
  Administered 2024-03-06: 6.6 via INTRAVENOUS

## 2024-03-08 DIAGNOSIS — R911 Solitary pulmonary nodule: Secondary | ICD-10-CM

## 2024-03-08 HISTORY — DX: Solitary pulmonary nodule: R91.1

## 2024-03-10 ENCOUNTER — Emergency Department (HOSPITAL_BASED_OUTPATIENT_CLINIC_OR_DEPARTMENT_OTHER)

## 2024-03-10 ENCOUNTER — Other Ambulatory Visit: Payer: Self-pay

## 2024-03-10 ENCOUNTER — Encounter (HOSPITAL_BASED_OUTPATIENT_CLINIC_OR_DEPARTMENT_OTHER): Payer: Self-pay

## 2024-03-10 ENCOUNTER — Emergency Department (HOSPITAL_BASED_OUTPATIENT_CLINIC_OR_DEPARTMENT_OTHER)
Admission: EM | Admit: 2024-03-10 | Discharge: 2024-03-10 | Disposition: A | Discharge status: Home / Self Care | Attending: Emergency Medicine | Admitting: Emergency Medicine

## 2024-03-10 DIAGNOSIS — R590 Localized enlarged lymph nodes: Secondary | ICD-10-CM | POA: Diagnosis not present

## 2024-03-10 DIAGNOSIS — R7989 Other specified abnormal findings of blood chemistry: Secondary | ICD-10-CM | POA: Diagnosis not present

## 2024-03-10 DIAGNOSIS — B9789 Other viral agents as the cause of diseases classified elsewhere: Secondary | ICD-10-CM | POA: Diagnosis not present

## 2024-03-10 DIAGNOSIS — Z7982 Long term (current) use of aspirin: Secondary | ICD-10-CM | POA: Diagnosis not present

## 2024-03-10 DIAGNOSIS — R109 Unspecified abdominal pain: Secondary | ICD-10-CM | POA: Diagnosis not present

## 2024-03-10 DIAGNOSIS — Z85118 Personal history of other malignant neoplasm of bronchus and lung: Secondary | ICD-10-CM | POA: Insufficient documentation

## 2024-03-10 DIAGNOSIS — R079 Chest pain, unspecified: Secondary | ICD-10-CM | POA: Diagnosis not present

## 2024-03-10 DIAGNOSIS — J449 Chronic obstructive pulmonary disease, unspecified: Secondary | ICD-10-CM | POA: Diagnosis not present

## 2024-03-10 DIAGNOSIS — J439 Emphysema, unspecified: Secondary | ICD-10-CM | POA: Diagnosis not present

## 2024-03-10 DIAGNOSIS — J929 Pleural plaque without asbestos: Secondary | ICD-10-CM | POA: Diagnosis not present

## 2024-03-10 DIAGNOSIS — R059 Cough, unspecified: Secondary | ICD-10-CM | POA: Diagnosis present

## 2024-03-10 DIAGNOSIS — R0789 Other chest pain: Secondary | ICD-10-CM | POA: Diagnosis not present

## 2024-03-10 DIAGNOSIS — J069 Acute upper respiratory infection, unspecified: Secondary | ICD-10-CM | POA: Diagnosis not present

## 2024-03-10 DIAGNOSIS — R918 Other nonspecific abnormal finding of lung field: Secondary | ICD-10-CM | POA: Diagnosis not present

## 2024-03-10 DIAGNOSIS — K824 Cholesterolosis of gallbladder: Secondary | ICD-10-CM | POA: Diagnosis not present

## 2024-03-10 LAB — CBC WITH DIFFERENTIAL/PLATELET
Abs Granulocyte: 2.8 K/uL (ref 1.5–6.5)
Abs Immature Granulocytes: 0.14 K/uL — ABNORMAL HIGH (ref 0.00–0.07)
Basophils Absolute: 0.1 K/uL (ref 0.0–0.1)
Basophils Relative: 3 %
Eosinophils Absolute: 0.2 K/uL (ref 0.0–0.5)
Eosinophils Relative: 4 %
HCT: 45.8 % (ref 39.0–52.0)
Hemoglobin: 15.7 g/dL (ref 13.0–17.0)
Immature Granulocytes: 3 %
Lymphocytes Relative: 17 %
Lymphs Abs: 0.9 K/uL (ref 0.7–4.0)
MCH: 33.7 pg (ref 26.0–34.0)
MCHC: 34.3 g/dL (ref 30.0–36.0)
MCV: 98.3 fL (ref 80.0–100.0)
Monocytes Absolute: 1 K/uL (ref 0.1–1.0)
Monocytes Relative: 20 %
Neutro Abs: 2.8 K/uL (ref 1.7–7.7)
Neutrophils Relative %: 53 %
Platelets: 135 K/uL — ABNORMAL LOW (ref 150–400)
RBC: 4.66 MIL/uL (ref 4.22–5.81)
RDW: 15.3 % (ref 11.5–15.5)
WBC: 5.2 K/uL (ref 4.0–10.5)
nRBC: 0 % (ref 0.0–0.2)

## 2024-03-10 LAB — COMPREHENSIVE METABOLIC PANEL WITH GFR
ALT: 143 U/L — ABNORMAL HIGH (ref 0–44)
AST: 170 U/L — ABNORMAL HIGH (ref 15–41)
Albumin: 3.8 g/dL (ref 3.5–5.0)
Alkaline Phosphatase: 477 U/L — ABNORMAL HIGH (ref 38–126)
Anion gap: 15 (ref 5–15)
BUN: 14 mg/dL (ref 8–23)
CO2: 23 mmol/L (ref 22–32)
Calcium: 9.2 mg/dL (ref 8.9–10.3)
Chloride: 99 mmol/L (ref 98–111)
Creatinine, Ser: 0.71 mg/dL (ref 0.61–1.24)
GFR, Estimated: >60 mL/min (ref >60)
Glucose, Bld: 91 mg/dL (ref 70–99)
Potassium: 4.2 mmol/L (ref 3.5–5.1)
Sodium: 137 mmol/L (ref 135–145)
Total Bilirubin: 1.2 mg/dL (ref 0.0–1.2)
Total Protein: 7.5 g/dL (ref 6.5–8.1)

## 2024-03-10 LAB — RESP PANEL BY RT-PCR (RSV, FLU A&B, COVID)  RVPGX2
Influenza A by PCR: NEGATIVE
Influenza B by PCR: NEGATIVE
Resp Syncytial Virus by PCR: NEGATIVE
SARS Coronavirus 2 by RT PCR: NEGATIVE

## 2024-03-10 LAB — TROPONIN T, HIGH SENSITIVITY
Troponin T High Sensitivity: <15 ng/L (ref <19)
Troponin T High Sensitivity: <15 ng/L (ref <19)

## 2024-03-10 MED ORDER — IPRATROPIUM-ALBUTEROL 0.5-2.5 (3) MG/3ML IN SOLN
3.0000 mL | Freq: Once | RESPIRATORY_TRACT | Status: AC
  Administered 2024-03-10: 3 mL via RESPIRATORY_TRACT
  Filled 2024-03-10: qty 3

## 2024-03-10 MED ORDER — IOHEXOL 350 MG/ML SOLN
75.0000 mL | Freq: Once | INTRAVENOUS | Status: AC | PRN
  Administered 2024-03-10: 75 mL via INTRAVENOUS

## 2024-03-10 NOTE — ED Provider Notes (Signed)
 [place] [facility] Provider Note   CSN: [id number] Arrival date & time: 03/10/24  1045     Patient presents with: Chest Pain   Stephen Leon is a 78 y.o. male with history of COPD, history of lung cancer with previous lobectomy, current lung nodule, NSTEMI, thoracic aortic aneurysm without rupture, with concern for productive cough, chills, and loss of appetite for the past week.  No abdominal pain or vomiting. He also reports he is feeling little bit more short of breath than normal.  He reports that he does have a chronic pain in his chest that is intermittent.  This has been there for couple months.  He states pain is not present at rest, but usually comes on with exertion.  Denies any pain that radiates to the back.   HPI     Prior to Admission medications   Medication Sig Start Date End Date Taking? Authorizing Provider  albuterol  (VENTOLIN  HFA) 108 (90 Base) MCG/ACT inhaler Inhale 2 puffs into the lungs every 6 (six) hours as needed. 12/15/15   [provider]  aspirin  EC 81 MG tablet Take 81 mg by mouth daily. Swallow whole.    [provider]  atorvastatin  (LIPITOR ) 80 MG tablet Take 80 mg by mouth daily.    [provider]  budesonide-glycopyrrolate -formoterol (BREZTRI  AEROSPHERE) 160-9-4.8 MCG/ACT AERO inhaler Inhale 2 puffs into the lungs in the morning and at bedtime. 03/04/24   Tamea Dedra CROME, MD  chlorpheniramine-HYDROcodone  (TUSSIONEX) 10-8 MG/5ML Take 5 mLs by mouth at bedtime as needed. 08/21/23   Barbarann Oneil BROCKS, MD  clopidogrel  (PLAVIX ) 75 MG tablet Take 1 tablet (75 mg total) by mouth daily. 05/12/22   Alexander, Natalie, DO  doxycycline  (VIBRAMYCIN ) 100 MG capsule Take 1 capsule (100 mg total) by mouth 2 (two) times daily for 7 days. 03/04/24 03/11/24  Billy Asberry FALCON, PA-C  fentaNYL  (DURAGESIC ) 50 MCG/HR Place 1 patch onto the skin every 3 (three) days. 06/29/12   [provider]   gabapentin  (NEURONTIN ) 800 MG tablet Take 800 mg by mouth 2 (two) times daily.  03/09/20   [provider]  HYDROcodone -acetaminophen  (NORCO/VICODIN) 5-325 MG tablet Take 1 tablet by mouth every 12 (twelve) hours as needed for moderate pain (pain score 4-6). 09/12/23   Barbarann Oneil BROCKS, MD  ibuprofen  (ADVIL ) 800 MG tablet Take 800 mg by mouth every 8 (eight) hours as needed for mild pain.    [provider]  ipratropium-albuterol  (DUONEB) 0.5-2.5 (3) MG/3ML SOLN Inhale 3 mLs into the lungs every 4 (four) hours as needed (When he get sick and the doctor tells him to take it). 06/29/18   [provider]  isosorbide  mononitrate (IMDUR ) 30 MG 24 hr tablet Take 1 tablet (30 mg total) by mouth daily. 05/12/22   Alexander, Natalie, DO  LORazepam  (ATIVAN ) 0.5 MG tablet Take 0.5 mg by mouth every 6 (six) hours as needed. 08/31/12   [provider]  metoprolol  succinate (TOPROL -XL) 25 MG 24 hr tablet Take 1 tablet (25 mg total) by mouth daily. 05/12/22   Alexander, Natalie, DO  mupirocin  ointment (BACTROBAN ) 2 % Apply 1 Application topically 2 (two) times daily. 03/04/24   Tamea Dedra CROME, MD  nicotine  polacrilex (NICORETTE ) 2 MG gum Take 1 each (2 mg total) by mouth as needed for smoking cessation. 05/12/22   Alexander, Natalie, DO  omeprazole (PRILOSEC) 40 MG capsule Take 40 mg by mouth daily.    [provider]  oxycodone  (OXY-IR) 5 MG capsule Take 5 mg by mouth every 4 (four) hours as needed. 08/31/12   [provider]  predniSONE (DELTASONE) 10 MG tablet Take 10 mg by mouth. 01/09/24   [provider]  sodium chloride , hypertonic, 3 % solution Inject into the vein continuous.    [provider]  tamsulosin (FLOMAX) 0.4 MG CAPS capsule Take 0.4 mg by mouth at bedtime. 03/24/20   [provider]    Allergies: Codeine    Review of Systems  Constitutional:  Positive for chills.  Respiratory:  Positive for cough and shortness of breath.      Updated Vital Signs BP 134/83   Pulse 70   Temp 97.8 F (36.6 C) (Oral)   Resp (!) 24   SpO2 94%   Physical Exam Vitals and nursing note reviewed.  Constitutional:      General: He is not in acute distress.    Appearance: He is well-developed.  HENT:     Head: Normocephalic and atraumatic.  Eyes:     Conjunctiva/sclera: Conjunctivae normal.  Cardiovascular:     Rate and Rhythm: Normal rate and regular rhythm.     Heart sounds: No murmur heard. Pulmonary:     Effort: Pulmonary effort is normal. No respiratory distress.     Comments: Wheezing in upper lobes bilaterally  Talking in full sentences on room air without difficulty Abdominal:     Palpations: Abdomen is soft.     Tenderness: There is no abdominal tenderness.  Musculoskeletal:        General: No swelling.     Cervical back: Neck supple.  Skin:    General: Skin is warm and dry.     Capillary Refill: Capillary refill takes less than 2 seconds.     Comments: Punctate, scabbed over area on right upper thigh. No purulent drainage. No fluctuance, no significant erythema  Neurological:     Mental Status: He is alert.  Psychiatric:        Mood and Affect: Mood normal.     (all labs ordered are listed, but only abnormal results are displayed) Labs Reviewed  CBC WITH DIFFERENTIAL/PLATELET - Abnormal; Notable for the following components:      Result Value   Platelets 135 (*)    Abs Immature Granulocytes 0.14 (*)    All other components within normal limits  COMPREHENSIVE METABOLIC PANEL WITH GFR - Abnormal; Notable for the following components:   AST 170 (*)    ALT 143 (*)    Alkaline Phosphatase 477 (*)    All other components within normal limits  RESP PANEL BY RT-PCR (RSV, FLU A&B, COVID)  RVPGX2  TROPONIN T, HIGH SENSITIVITY  TROPONIN T, HIGH SENSITIVITY    EKG: None  Radiology: US  Abdomen Limited RUQ (LIVER/GB) Result Date: 03/10/2024 EXAM: Right Upper Quadrant Abdominal Ultrasound 03/10/2024  02:08:25 PM TECHNIQUE: Real-time ultrasonography of the right upper quadrant of the abdomen was performed. COMPARISON: None available. CLINICAL HISTORY: Elevated LFTs FINDINGS: LIVER: The liver demonstrates normal echogenicity. No intrahepatic biliary ductal dilatation. No evidence of mass. BILIARY SYSTEM: There is a 4 mm echogenic polyp along the posterior wall near the fundus of the gallbladder. No pericholecystic fluid or wall thickening. No cholelithiasis. Negative sonographic Murphy's sign. Common bile duct is within normal limits measuring . RIGHT KIDNEY: The right kidney is grossly unremarkable in appearances without evidence of hydronephrosis, echogenic calculi or worrisome mass lesions. PANCREAS: Visualized portions of the pancreas are unremarkable. OTHER: No right upper  quadrant ascites. IMPRESSION: 1. 4 mm echogenic polyp along the posterior wall near the fundus of the gallbladder. Electronically signed by: evalene coho 03/10/2024 02:15 PM EDT RP Workstation: [id number]   CT Angio Chest PE W and/or Wo Contrast Result Date: 03/10/2024 CLINICAL DATA:  Pulmonary embolism (PE) suspected, high prob Pt presents with complaints of chest AND abdominal pain X 1 week. Also endorses SOB, loss of appetite, NANDV, Chills. Hx of lung cancer. Did PET scan on las Wednesday and results showed mass in left upper lobe EXAM: CT ANGIOGRAPHY CHEST WITH CONTRAST TECHNIQUE: Multidetector CT imaging of the chest was performed using the standard protocol during bolus administration of intravenous contrast. Multiplanar CT image reconstructions and MIPs were obtained to evaluate the vascular anatomy. RADIATION DOSE REDUCTION: This exam was performed according to the departmental dose-optimization program which includes automated exposure control, adjustment of the mA and/or kV according to patient size and/or use of iterative reconstruction technique. CONTRAST:  75mL OMNIPAQUE  IOHEXOL  350 MG/ML SOLN COMPARISON:  Chest x-ray  03/10/2024, PET CT 03/06/2024, x-ray lumbar spine 10/10/2023, CT chest 02/16/2024 FINDINGS: Cardiovascular: Satisfactory opacification of the pulmonary arteries to the segmental level. No evidence of pulmonary embolism. Prominent heart size. No significant pericardial effusion. The ascending thoracic aorta measures up to 4.2 cm-stable. The descending thoracic aorta is normal in caliber. Severe atherosclerotic plaque of the thoracic aorta. No coronary artery calcifications. Mediastinum/Nodes: Grossly stable pre-vascular lymphadenopathy and left hilar lymphadenopathy measuring up to 1.4 cm in short axis in the mediastinum and 1.1 cm in short axis lung the left hilum. No right hilar or axillary lymph nodes. Thyroid  gland, trachea, and esophagus demonstrate no significant findings. Lungs/Pleura: Moderate centrilobular emphysematous changes. Bibasilar atelectasis. No focal consolidation. Stable to possibly slightly increased in size left upper lobe subpleural 1.4 x 1.2 cm (from 1.2 x 1 cm). No new pulmonary nodule. No pulmonary mass. Redemonstration of bilateral calcified pleural plaques. No pleural effusion. No pneumothorax. Upper Abdomen: Severe atherosclerotic plaque. Stable aneurysmal suprarenal abdominal aorta measuring up to 3.2 cm. Partially visualized infrarenal abdominal aorta aneurysm measuring up to 3.1 cm. No acute abnormality. Musculoskeletal: No chest wall abnormality. No suspicious lytic or blastic osseous lesions. No acute displaced fracture. Chronic T7 fracture status post kyphoplasty. Chronic L2 and L3 compression fractures with worsening of L3 vertebral body height loss. Multilevel degenerative changes of the spine. Review of the MIP images confirms the above findings. IMPRESSION: 1. Stable to possibly slightly increased in size left upper lobe subpleural 1.4 x 1.2 cm (from 1.2 x 1 cm). 2. Grossly stable mediastinal and left hilar lymphadenopathy. 3. Bilateral calcified pleural plaques suggestive of  asbestos exposure. 4.  Emphysema (ICD10-J43.9). 5. 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 Recommend annual imaging followup by CTA or MRA. This recommendation follows 2010 ACCF/AHA/AATS/ACR/ASA/SCA/SCAI/SIR/STS/SVM Guidelines for the Diagnosis and Management of Patients with Thoracic Aortic Disease. Circulation. 2010; 121: Z733-z630. Aortic aneurysm NOS (ICD10-I71.9). 6. 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 Recommend follow-up ultrasound every 3 years. (Ref.: J Vasc Surg. 2018; 67:2-77 and J Am Coll Radiol 2013;10(10):789-794.) 7. Aortic Atherosclerosis (ICD10-I70.0). 8. Chronic T7 fracture status post kyphoplasty. Chronic L2 and L3 compression fractures with worsening of L3 vertebral body height loss. Electronically Signed   By: Morgane  Naveau M.D.   On: 03/10/2024 13:12   DG Chest 2 View Result Date: 03/10/2024 EXAM: 2 VIEW(S) XRAY OF THE CHEST 03/10/2024 11:33:00 AM COMPARISON: 04/20/2023 CLINICAL HISTORY: Chest pain. Pt presents with complaints  of chest \\T \ abdominal pain X 1 week. Also endorses SOB, loss of appetite, N\T\V, Chills. Hx of lung cancer. Did PET scan on las Wednesday and results showed mass in left upper lobe. FINDINGS: LUNGS AND PLEURA: Coarsened interstitial markings compatible with COPD/emphysema. Bilateral calcified pleural plaques. HEART AND MEDIASTINUM: No acute abnormality of the cardiac and mediastinal silhouettes. Aortic atherosclerosis. BONES AND SOFT TISSUES: Barosomet-augmentation of T7 compression fracture. IMPRESSION: 1. No acute findings. 2. Coarsened interstitial markings compatible with COPD/emphysema. 3. Bilateral calcified pleural plaques. Electronically signed by: Waddell Calk MD 03/10/2024 11:56 AM EDT RP Workstation: [id number]     Procedures   Medications Ordered in the ED  ipratropium-albuterol  (DUONEB) 0.5-2.5 (3) MG/3ML nebulizer solution 3 mL (3 mLs Nebulization  Given 03/10/24 1136)  iohexol  (OMNIPAQUE ) 350 MG/ML injection 75 mL (75 mLs Intravenous Contrast Given 03/10/24 1240)                                    Medical Decision Making Amount and/or Complexity of Data Reviewed Labs: ordered. Radiology: ordered.  Risk Prescription drug management.     Differential diagnosis includes but is not limited to COVID, flu, RSV, viral URI, strep pharyngitis, viral pharyngitis, allergic rhinitis, pneumonia, bronchitis, ACS, arrhythmia, aortic aneurysm, pericarditis, myocarditis, pericardial effusion, cardiac tamponade, musculoskeletal pain, GERD, Boerhaave's syndrome, DVT/PE, pneumonia, pleural effusion   ED Course:  Upon initial evaluation, patient is well-appearing, no acute distress.  Stable vitals aside from mildly elevated blood pressure 140/88.  He does report increased shortness of breath over the past week with a productive cough.  He does have mild wheezing in the upper lobes bilaterally.  He is talking in full sentences on room air without difficulty.  Maintaining 95% oxygen saturation on room air.  No chest pain currently.  Does report some intermittent chest pains usually when up walking for the past couple months.  Labs Ordered: I Ordered, and personally interpreted labs.  The pertinent results include:   CBC with low platelets at 135, appears he normally is in the 200s.  Other blood counts within normal limits CMP with elevated AST, ALT, and alk phos at 170, 143, and 477 respectively.  No elevation in bilirubin.  No elevation in creatinine.  No electrolyte abnormalities Initial and repeat troponin under 15 COVID, flu, RSV negative  Imaging Studies ordered: I ordered imaging studies including chest x-ray, CTA chest, right upper quadrant ultrasound I independently visualized the imaging with scope of interpretation limited to determining acute life threatening conditions related to emergency care.  Chest x-ray without acute findings.   Findings compatible with known COPD/emphysema  CTA chest: IMPRESSION: 1. Stable to possibly slightly increased in size left upper lobe subpleural 1.4 x 1.2 cm (from 1.2 x 1 cm). 2. Grossly stable mediastinal and left hilar lymphadenopathy. 3. 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 4.  Emphysema (ICD10-J43.9). 5. 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 Recommend annual imaging followup by CTA or MRA. This recommendation follows 2010 ACCF/AHA/AATS/ACR/ASA/SCA/SCAI/SIR/STS/SVM Guidelines for the Diagnosis and Management of Patients with Thoracic Aortic Disease. Circulation. 2010; 121: Z733-z630. Aortic aneurysm NOS (ICD10-I71.9). 6. 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 Recommend follow-up ultrasound every 3 years. (Ref.: J Vasc Surg. 2018; 67:2-77 and J Am Coll Radiol 2013;10(10):789-794.) 7. Aortic Atherosclerosis (ICD10-I70.0). 8. Chronic T7 fracture status post kyphoplasty. Chronic L2 and L3 compression fractures with worsening of  L3 vertebral body height loss.  Right upper quadrant ultrasound IMPRESSION: 1. 4 mm echogenic polyp along the posterior wall near the fundus of the gallbladder. I agree with the radiologist interpretation   Cardiac Monitoring: / EKG: The patient was maintained on a cardiac monitor.  I personally viewed and interpreted the cardiac monitored which showed an underlying rhythm of: Normal sinus rhythm with PVCs noted  Medications Given: None  Upon re-evaluation, patient remains well-appearing with stable vitals.  His symptoms of cough, chills, loss of appetite consistent with likely viral URI.  His flu, COVID, RSV testing is negative.  Chest x-ray negative for pneumonia or other acute abnormality. Given his reported increased shortness of breath and chest pain, CT of the chest was obtained for further evaluation.  This did not  show any acute abnormalities.  No evidence of pulmonary embolism.  His thoracic aortic aneurysm is stable.  I have low concern for ACS at this time given initial and repeat troponin under 15, EKG with normal sinus rhythm and occasional PVCs.  No significant arrhythmia.  No active chest pain currently.  I did review the PET scan that was obtained from 03/06/2024 which did reveal a spiculated hypermetabolic left upper lobe mass consistent with a neoplasm.  I did discuss this finding with the patient and understands he needs to follow-up with his pulmonologist as soon as possible for further management of his lung cancer.  It is possible this is also contributing to his shortness of breath and fatigue lately. His labs showed elevated AST, ALT, and alk phos.  Right upper quadrant ultrasound did not show any abnormalities of the liver.  There was an incidental polyp noted on the gallbladder.  I discussed this finding with the patient.  He understands he needs to make his PCP aware of this finding and he has to follow-up with gastroenterology regarding this.  Patient stable and appropriate for discharge home  Impression: Viral URI Lung cancer  Disposition:  The patient was discharged home with instructions to follow-up with PCP within the next month regarding his elevated liver enzymes and for GI referral.  I recommended also reaching out to GI to see if he can get an appointment within the next month regarding the elevated liver enzymes and polyp noted on his gallbladder.  Follow-up with his pulmonologist as soon as possible regarding the lung cancer finding.  Return precautions given.    Record Review: External records from outside source obtained and reviewed including PET scan from 03/06/2024, vascular notes for thoracic aortic aneurysm     This chart was dictated using voice recognition software, Dragon. Despite the best efforts of this provider to proofread and correct errors, errors may still  occur which can change documentation meaning.       Final diagnoses:  Viral URI    ED Discharge Orders     None          Veta Palma, PA-C 03/10/24 1548    Patsey Lot, MD 03/11/24 0700

## 2024-03-10 NOTE — ED Triage Notes (Signed)
 Pt presents with complaints of chest & abdominal pain X 1 week. Also endorses SOB, loss of appetite, N&V, Chills.   Hx of lung cancer. Did PET scan on las Wednesday and results showed mass in left upper lobe

## 2024-03-10 NOTE — ED Notes (Signed)
 Reviewed discharge instructions and follow up with patient. Pt states understanding.Reports feeling better. Ambulatory with wife

## 2024-03-10 NOTE — Discharge Instructions (Addendum)
 I suspect you have a viral URI (cold) which is causing your symptoms for the past week.   Your covid, flu, and RSV testing was negative.   Your chest x-ray showed some changes consistent with COPD, but no signs of pneumonia or other abnormality.  Your cardiac enzyme (troponin) was normal today. Your EKG which is a measure of the heart's electrical activity and rhythm is normal today. These would both show abnormalities if you were having a heart attack.   No signs of a blood clot in your lungs (pulmonary embolism) on your CT scan.  Your ascending thoracic aortic aneurysm is stable at 4.2 cm.  Please have the size of this monitored by your vascular team/PCP.   Your liver enzymes (AST, ALT, Alkaline phosphatase) were elevated today.  The ultrasound of your liver did not show any abnormalities in your liver.  However, there was a polyp noted on the gallbladder.  Please make your PCP aware of this finding. I would recommend following up with a gastroenterologist within the next month.  You can call to schedule this appointment, or you can have your PCP refer you.  When reviewing your PET scan from 7/30, the nodule in the left upper lobe is concerning for cancer.  Please follow-up with your pulmonologist as possible for further management.  Your platelets are low here today.  Please have your PCP continue to monitor this.  Your other blood counts are normal.  Your electrolytes and kidney function are normal today.  Please return to the ER for worsening chest pain, shortness of breath, unexplained fevers, any other new or concerning symptoms

## 2024-03-11 ENCOUNTER — Inpatient Hospital Stay (HOSPITAL_COMMUNITY): Admission: RE | Admit: 2024-03-11 | Source: Ambulatory Visit

## 2024-03-11 ENCOUNTER — Ambulatory Visit: Payer: Self-pay

## 2024-03-11 NOTE — Telephone Encounter (Signed)
 FYI Only or Action Required?: Action required by provider: request for appointment.  Patient is followed in Pulmonology for COPD, Lung Mass, last seen on 03/04/2024 by Tamea Dedra CROME, MD.  Called Nurse Triage reporting No chief complaint on file..   Interventions attempted: Increased fluids/rest.  Symptoms are: stable.  Triage Disposition: See PCP Within 2 Weeks  Patient/caregiver understands and will follow disposition?:    Copied from CRM [id number]. Topic: Clinical - Red Word Triage >> Mar 11, 2024  8:08 AM Nathanel DEL wrote: Red Word that prompted transfer to Nurse Triage: pt is very anxious due to the lung mass found.  Pt not eating, drinking .  Pt has a boil that seems to be not infected Wife thinks it is his nerves, pt does not see Camie until 8/18. He is hoping to get him seen asap. Pt is anxious to know what treatment Camie is going to be. Reason for Disposition  Question about upcoming scheduled surgery, procedure or test, no triage required, and triager able to answer question  Requesting regular office appointment  Answer Assessment - Initial Assessment Questions 1. REASON FOR CALL: What is the main reason for your call? or How can I best help you?     Request for an earlier appointment  2. SYMPTOMS : Do you have any symptoms?      Denies any symptoms at this time, except excessive worry.  3. OTHER QUESTIONS: Do you have any other questions?     Would my treatment plan be explained to me when I see Lauraine?  Protocols used: Information Only Call - No Triage-A-AH

## 2024-03-12 NOTE — Telephone Encounter (Signed)
 Patient will need robotic bronchoscopy and endobronchial ultrasound.  He lives in [place].  Lauraine, could you please see him sooner than the 18th and get him set up with Dr. Byrum for bronchoscopy?

## 2024-03-13 DIAGNOSIS — R911 Solitary pulmonary nodule: Secondary | ICD-10-CM | POA: Diagnosis not present

## 2024-03-13 DIAGNOSIS — J479 Bronchiectasis, uncomplicated: Secondary | ICD-10-CM | POA: Diagnosis not present

## 2024-03-13 DIAGNOSIS — K824 Cholesterolosis of gallbladder: Secondary | ICD-10-CM | POA: Diagnosis not present

## 2024-03-13 DIAGNOSIS — F17210 Nicotine dependence, cigarettes, uncomplicated: Secondary | ICD-10-CM | POA: Diagnosis not present

## 2024-03-13 DIAGNOSIS — J439 Emphysema, unspecified: Secondary | ICD-10-CM | POA: Diagnosis not present

## 2024-03-13 DIAGNOSIS — G8912 Acute post-thoracotomy pain: Secondary | ICD-10-CM | POA: Diagnosis not present

## 2024-03-13 DIAGNOSIS — I2510 Atherosclerotic heart disease of native coronary artery without angina pectoris: Secondary | ICD-10-CM | POA: Diagnosis not present

## 2024-03-13 DIAGNOSIS — Z902 Acquired absence of lung [part of]: Secondary | ICD-10-CM | POA: Diagnosis not present

## 2024-03-13 DIAGNOSIS — F419 Anxiety disorder, unspecified: Secondary | ICD-10-CM | POA: Diagnosis not present

## 2024-03-13 NOTE — Telephone Encounter (Signed)
 Scheduled for 03/18/2024 after PFT

## 2024-03-18 ENCOUNTER — Ambulatory Visit: Admitting: Internal Medicine

## 2024-03-18 ENCOUNTER — Encounter: Payer: Self-pay | Admitting: Acute Care

## 2024-03-18 ENCOUNTER — Ambulatory Visit: Admitting: Acute Care

## 2024-03-18 ENCOUNTER — Encounter: Payer: Self-pay | Admitting: Emergency Medicine

## 2024-03-18 ENCOUNTER — Telehealth: Payer: Self-pay | Admitting: Acute Care

## 2024-03-18 VITALS — BP 124/83 | HR 72 | Temp 98.1°F | Ht 66.0 in | Wt 133.4 lb

## 2024-03-18 DIAGNOSIS — R9389 Abnormal findings on diagnostic imaging of other specified body structures: Secondary | ICD-10-CM

## 2024-03-18 DIAGNOSIS — J4489 Other specified chronic obstructive pulmonary disease: Secondary | ICD-10-CM

## 2024-03-18 DIAGNOSIS — J439 Emphysema, unspecified: Secondary | ICD-10-CM

## 2024-03-18 DIAGNOSIS — Z720 Tobacco use: Secondary | ICD-10-CM

## 2024-03-18 DIAGNOSIS — R911 Solitary pulmonary nodule: Secondary | ICD-10-CM | POA: Insufficient documentation

## 2024-03-18 DIAGNOSIS — J42 Unspecified chronic bronchitis: Secondary | ICD-10-CM | POA: Diagnosis not present

## 2024-03-18 DIAGNOSIS — R942 Abnormal results of pulmonary function studies: Secondary | ICD-10-CM

## 2024-03-18 DIAGNOSIS — F17210 Nicotine dependence, cigarettes, uncomplicated: Secondary | ICD-10-CM | POA: Diagnosis not present

## 2024-03-18 LAB — PULMONARY FUNCTION TEST
DL/VA % pred: 58 %
DL/VA: 2.35 ml/min/mmHg/L
DLCO cor % pred: 63 %
DLCO cor: 14.06 ml/min/mmHg
DLCO unc % pred: 65 %
DLCO unc: 14.48 ml/min/mmHg
FEF 25-75 Post: 0.86 L/s
FEF 25-75 Pre: 0.8 L/s
FEF2575-%Change-Post: 7 %
FEF2575-%Pred-Post: 47 %
FEF2575-%Pred-Pre: 44 %
FEV1-%Change-Post: -1 %
FEV1-%Pred-Post: 66 %
FEV1-%Pred-Pre: 67 %
FEV1-Post: 1.69 L
FEV1-Pre: 1.72 L
FEV1FVC-%Change-Post: -5 %
FEV1FVC-%Pred-Pre: 65 %
FEV6-%Change-Post: 1 %
FEV6-%Pred-Post: 110 %
FEV6-%Pred-Pre: 108 %
FEV6-Post: 3.64 L
FEV6-Pre: 3.58 L
FEV6FVC-%Change-Post: -2 %
FEV6FVC-%Pred-Post: 104 %
FEV6FVC-%Pred-Pre: 106 %
FVC-%Change-Post: 4 %
FVC-%Pred-Post: 105 %
FVC-%Pred-Pre: 101 %
FVC-Post: 3.75 L
FVC-Pre: 3.6 L
Post FEV1/FVC ratio: 45 %
Post FEV6/FVC ratio: 97 %
Pre FEV1/FVC ratio: 48 %
Pre FEV6/FVC Ratio: 99 %
RV % pred: 106 %
RV: 2.52 L
TLC % pred: 108 %
TLC: 6.76 L

## 2024-03-18 MED ORDER — BREZTRI AEROSPHERE 160-9-4.8 MCG/ACT IN AERO: 2.0000 | INHALATION_SPRAY | Freq: Two times a day (BID) | RESPIRATORY_TRACT | 3 refills | Status: DC

## 2024-03-18 MED ORDER — BREZTRI AEROSPHERE 160-9-4.8 MCG/ACT IN AERO: 2.0000 | INHALATION_SPRAY | Freq: Two times a day (BID) | RESPIRATORY_TRACT | Status: AC

## 2024-03-18 NOTE — Progress Notes (Signed)
 Full PFT performed today.

## 2024-03-18 NOTE — Telephone Encounter (Signed)
 Order given by the nurse [phone] will send to [facility] to get auth

## 2024-03-18 NOTE — Progress Notes (Addendum)
 History of Present Illness Stephen Leon is a 78 y.o. male current smoker with COPD, Chronic bronchitis, and abnormal lung cancer screening. He will be followed by Dr. Shelah for bronchoscopy with biopsies. Pt. Is followed by Dr. Isadora in the [facility] office for his COPD. He wanted his biopsies done in [place]. .    03/18/2024 Discussed the use of AI scribe software for clinical note transcription with the patient, who gave verbal consent to proceed.  History of Present Illness Stephen Leon is a 78 year old male  who presents for follow up of  a pulmonary nodule with PET scan uptake. He was referred by Dr. Lenda for further evaluation of a pulmonary nodule after being seen for an acute flare with hypoxemia.  He has a history of lung cancer and previously underwent robotic surgery to remove the right upper lobe. The current concern is a nodule in the left upper lobe, with additional uptake in some lymph nodes in the chest. He has experienced significant weight loss of 15 pounds over the last couple of months.  He currently smokes half a pack per day. He is on a low-dose aspirin  regimen, taking one aspirin  at night. He has been using Breztri , a sample provided by his doctor, but previously used Trelegy, which he preferred. However, Trelegy became unaffordable after the free supply ended.  He has a history of asbestos exposure during his 14 years in the [place], which has resulted in pleural plaques in his lungs. No sleep apnea, diabetes, or latex allergy. His breathing is generally okay, though exertion causes some difficulty. He has had general anesthesia before, and had no issues.  We have reviewed his PET scan and PFT's. We discussed that the next best step is for bronchoscopy with biopsies of the lung nodule and the lymph nodes. Pt. Is on ASA 81 mg daily. He understands he will need to come off the Aspirin  48 hours prior to the procedure. He verbalized  understanding.  Based on PFT's and previous lobectomy, if this is a malignancy, surgery would most likely not be an option.       Test Results: LDCT 02/16/2024 Lung-RADS 4B, suspicious. Additional imaging evaluation or consultation with Pulmonology or Thoracic Surgery recommended. Irregular solid anterior left upper lobe 11.7 mm pulmonary nodule is significantly increased from 4.0 mm on 01/16/2023 CT. Suggest PET-CT for further characterization at this time. 2. Two-vessel coronary atherosclerosis. 3. 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 Recommend attention on follow-up chest CT angiogram in 1 year. 4. 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 No pleural effusions. 5. Diffuse osteopenia. Chronic moderate T7 vertebral compression fracture status post vertebroplasty. 6. Aortic Atherosclerosis (ICD10-I70.0) and Emphysema (ICD10-J43.9).  PET Scan 03/06/2024 Spiculated hypermetabolic left upper lobe mass consistent with neoplasm. Three hypermetabolic lymph nodes identified, 2 along the left side of the mediastinum and 1 along the left hilum worrisome for regional spread of disease. No additional areas of abnormal uptake elsewhere. Calcified pleural plaques.  Emphysematous lung changes. Diffuse vascular calcifications with dilatation of the common iliac arteries and some aneurysmal dilatation of the inferior abdominal aorta of up to 3.2 cm. In addition there is extensive plaque along the iliac vessels with potential areas of significant stenosis. Please correlate with any symptoms and if needed follow-up CT angiogram could be performed as clinically appropriate. Recommend follow-up ultrasound every 3 years for the abdominal aortic aneurysm. Mucosal thickening of the right maxillary sinus with an air-fluid level.  Please correlate for any clinical evidence of sinusitis.  CTA 03/10/2024 Stable to possibly slightly increased in  size left upper lobe subpleural 1.4 x 1.2 cm (from 1.2 x 1 cm). 2. Grossly stable mediastinal and left hilar lymphadenopathy. 3. 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 4.  Emphysema (ICD10-J43.9).      Latest Ref Rng & Units 03/10/2024   11:03 AM 05/11/2022    6:35 AM 05/10/2022    3:10 PM  CBC  WBC 4.0 - 10.5 K/uL 5.2  9.3  9.7   Hemoglobin 13.0 - 17.0 g/dL 84.2  87.0  86.8   Hematocrit 39.0 - 52.0 % 45.8  39.4  40.1   Platelets 150 - 400 K/uL 135  C 212  223     C Corrected result       Latest Ref Rng & Units 03/10/2024   11:03 AM 05/12/2022    5:37 AM 05/11/2022    6:35 AM  BMP  Glucose 70 - 99 mg/dL 91  98  98   BUN 8 - 23 mg/dL 14  17  14    Creatinine 0.61 - 1.24 mg/dL 9.28  9.33  9.29   Sodium 135 - 145 mmol/L 137  139  138   Potassium 3.5 - 5.1 mmol/L 4.2  3.9  4.1   Chloride 98 - 111 mmol/L 99  104  106   CO2 22 - 32 mmol/L 23  27  27    Calcium  8.9 - 10.3 mg/dL 9.2  8.5  8.8     BNP    Component Value Date/Time   BNP 141.1 (H) 05/09/2023 0946    ProBNP No results found for: PROBNP  PFT    Component Value Date/Time   FEV1PRE 1.72 03/18/2024 1225   FEV1POST 1.69 03/18/2024 1225   FVCPRE 3.60 03/18/2024 1225   FVCPOST 3.75 03/18/2024 1225   TLC 6.76 03/18/2024 1225   DLCOUNC 14.48 03/18/2024 1225   PREFEV1FVCRT 48 03/18/2024 1225   PSTFEV1FVCRT 45 03/18/2024 1225    US  Abdomen Limited RUQ (LIVER/GB) Result Date: 03/10/2024 EXAM: Right Upper Quadrant Abdominal Ultrasound 03/10/2024 02:08:25 PM TECHNIQUE: Real-time ultrasonography of the right upper quadrant of the abdomen was performed. COMPARISON: None available. CLINICAL HISTORY: Elevated LFTs FINDINGS: LIVER: The liver demonstrates normal echogenicity. No intrahepatic biliary ductal dilatation. No evidence of mass. BILIARY SYSTEM: There is a 4 mm echogenic polyp along the posterior wall near the fundus of the gallbladder. No pericholecystic fluid or wall thickening. No  cholelithiasis. Negative sonographic Murphy's sign. Common bile duct is within normal limits measuring . RIGHT KIDNEY: The right kidney is grossly unremarkable in appearances without evidence of hydronephrosis, echogenic calculi or worrisome mass lesions. PANCREAS: Visualized portions of the pancreas are unremarkable. OTHER: No right upper quadrant ascites. IMPRESSION: 1. 4 mm echogenic polyp along the posterior wall near the fundus of the gallbladder. Electronically signed by: evalene coho 03/10/2024 02:15 PM EDT RP Workstation: [id number]   CT Angio Chest PE W and/or Wo Contrast Result Date: 03/10/2024 CLINICAL DATA:  Pulmonary embolism (PE) suspected, high prob Pt presents with complaints of chest AND abdominal pain X 1 week. Also endorses SOB, loss of appetite, NANDV, Chills. Hx of lung cancer. Did PET scan on las Wednesday and results showed mass in left upper lobe EXAM: CT ANGIOGRAPHY CHEST WITH CONTRAST TECHNIQUE: Multidetector CT imaging of the chest was performed using the standard protocol during bolus administration of intravenous contrast. Multiplanar CT image reconstructions and MIPs were obtained to evaluate the  vascular anatomy. RADIATION DOSE REDUCTION: This exam was performed according to the departmental dose-optimization program which includes automated exposure control, adjustment of the mA and/or kV according to patient size and/or use of iterative reconstruction technique. CONTRAST:  75mL OMNIPAQUE  IOHEXOL  350 MG/ML SOLN COMPARISON:  Chest x-ray 03/10/2024, PET CT 03/06/2024, x-ray lumbar spine 10/10/2023, CT chest 02/16/2024 FINDINGS: Cardiovascular: Satisfactory opacification of the pulmonary arteries to the segmental level. No evidence of pulmonary embolism. Prominent heart size. No significant pericardial effusion. The ascending thoracic aorta measures up to 4.2 cm-stable. The descending thoracic aorta is normal in caliber. Severe atherosclerotic plaque of the thoracic aorta. No  coronary artery calcifications. Mediastinum/Nodes: Grossly stable pre-vascular lymphadenopathy and left hilar lymphadenopathy measuring up to 1.4 cm in short axis in the mediastinum and 1.1 cm in short axis lung the left hilum. No right hilar or axillary lymph nodes. Thyroid  gland, trachea, and esophagus demonstrate no significant findings. Lungs/Pleura: Moderate centrilobular emphysematous changes. Bibasilar atelectasis. No focal consolidation. Stable to possibly slightly increased in size left upper lobe subpleural 1.4 x 1.2 cm (from 1.2 x 1 cm). No new pulmonary nodule. No pulmonary mass. Redemonstration of bilateral calcified pleural plaques. No pleural effusion. No pneumothorax. Upper Abdomen: Severe atherosclerotic plaque. Stable aneurysmal suprarenal abdominal aorta measuring up to 3.2 cm. Partially visualized infrarenal abdominal aorta aneurysm measuring up to 3.1 cm. No acute abnormality. Musculoskeletal: No chest wall abnormality. No suspicious lytic or blastic osseous lesions. No acute displaced fracture. Chronic T7 fracture status post kyphoplasty. Chronic L2 and L3 compression fractures with worsening of L3 vertebral body height loss. Multilevel degenerative changes of the spine. Review of the MIP images confirms the above findings. IMPRESSION: 1. Stable to possibly slightly increased in size left upper lobe subpleural 1.4 x 1.2 cm (from 1.2 x 1 cm). 2. Grossly stable mediastinal and left hilar lymphadenopathy. 3. 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 4.  Emphysema (ICD10-J43.9). 5. 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 Recommend annual imaging followup by CTA or MRA. This recommendation follows 2010 ACCF/AHA/AATS/ACR/ASA/SCA/SCAI/SIR/STS/SVM Guidelines for the Diagnosis and Management of Patients with Thoracic Aortic Disease. Circulation. 2010; 121: [id number]. Aortic aneurysm NOS (ICD10-I71.9). 6. Stable aneurysmal suprarenal abdominal aorta (3.2 cm close)  and partially visualized infrarenal abdominal aorta (3.1 cm). Recommend follow-up ultrasound every 3 years. (Ref.: J Vasc Surg. 2018; 67:2-77 and J Am Coll Radiol 2013;10(10):789-794.) 7. Aortic Atherosclerosis (ICD10-I70.0). 8. Chronic T7 fracture status post kyphoplasty. Chronic L2 and L3 compression fractures with worsening of L3 vertebral body height loss. Electronically Signed   By: Morgane  Naveau M.D.   On: 03/10/2024 13:12   DG Chest 2 View Result Date: 03/10/2024 EXAM: 2 VIEW(S) XRAY OF THE CHEST 03/10/2024 11:33:00 AM COMPARISON: 04/20/2023 CLINICAL HISTORY: Chest pain. Pt presents with complaints of chest \\T \ abdominal pain X 1 week. Also endorses SOB, loss of appetite, N\T\V, Chills. Hx of lung cancer. Did PET scan on las Wednesday and results showed mass in left upper lobe. FINDINGS: LUNGS AND PLEURA: Coarsened interstitial markings compatible with COPD/emphysema. Bilateral calcified pleural plaques. HEART AND MEDIASTINUM: No acute abnormality of the cardiac and mediastinal silhouettes. Aortic atherosclerosis. BONES AND SOFT TISSUES: Barosomet-augmentation of T7 compression fracture. IMPRESSION: 1. No acute findings. 2. Coarsened interstitial markings compatible with COPD/emphysema. 3. Bilateral calcified pleural plaques. Electronically signed by: Waddell Calk MD 03/10/2024 11:56 AM EDT RP Workstation: [id number]   NM PET Image Initial (PI) Skull Base To Thigh Result Date: 03/06/2024 CLINICAL DATA:  Initial treatment strategy for lung nodule. EXAM: NUCLEAR  MEDICINE PET SKULL BASE TO THIGH TECHNIQUE: 6.6 mCi F-18 FDG was injected intravenously. Full-ring PET imaging was performed from the skull base to thigh after the radiotracer. CT data was obtained and used for attenuation correction and anatomic localization. Fasting blood glucose: 106 mg/dl COMPARISON:  CT 92/88/7974 and older FINDINGS: Mediastinal blood pool activity: SUV max 1.8 Liver activity: SUV max 2.1 NECK: No specific abnormal uptake  seen in the neck including along lymph node change of the submandibular, posterior triangle or internal jugular region. Near symmetric uptake of the visualized intracranial compartment. Incidental CT findings: The parotid glands, submandibular glands and thyroid  glands preserved. Prominent calcifications along the carotid vasculature. Streak artifact related to the patient's dental hardware. Paranasal sinuses are with some mucosal thickening and fluid in the right maxillary sinus. Please correlate for any symptoms of sinusitis. Mastoid air cells are clear. CHEST: On the prior chest CT there is anterior left upper lobe spiculated mass abutting the pleura measuring close to 12 mm. This lesion is again seen on image 67 of series 4 and has uptake with maximum SUV of 5.3 worrisome for neoplasm. No additional areas of abnormal lung uptake identified. There are 2 left-sided mediastinal hypermetabolic nodes towards the AP window region. The slightly larger and posterior focus has maximum SUV 7.2 and on CT image 72 of series 4 this abnormal node measures 20 by 16 mm. Lesion just anterior to this on image 73 measures 20 by 8 mm and has maximum SUV of 6.7. Is also a hypermetabolic anterior left hilar lymph node with maximum SUV of 5.7 on image 76. No specific abnormal uptake above blood pool elsewhere including along the right side of the mediastinum, right hilum or axillary regions. Incidental CT findings: Please correlate with prior standard CT. There are calcified pleural plaques bilaterally. No pleural effusion. Mild left basilar atelectasis with some ground-glass. Emphysematous lung changes identified. Volume loss along middle lobe. Scattered vascular calcifications. Heart is nonenlarged. Trace pericardial fluid or thickening. Coronary artery calcifications are seen. ABDOMEN/PELVIS: No abnormal hypermetabolic activity within the liver, pancreas, adrenal glands, or spleen. No hypermetabolic lymph nodes in the abdomen or  pelvis. Incidental CT findings: On this limited CT for attenuation correction without contrast, the liver, spleen, adrenal glands are unremarkable. There is some atrophy of the pancreas. Gallbladder is present. Slightly dense renal pyramids bilaterally, nonspecific. No definite ureteral stone. Slight trabeculation of the wall of the urinary bladder with a prominent prostate. Large bowel has a normal course and caliber with scattered stool. Normal appendix. Left-sided colonic diverticula. Stomach and small bowel are nondilated. Diffuse vascular calcifications identified along the aorta and branch vessels. Mild dilatation of the abdominal aorta measuring up to 3.0 x 3.1 cm. There is ectasia of the common iliac arteries, on the right of 2.2 cm and left 1.7 cm. Again significant calcified plaque with potential areas of stenosis seen particularly along the common iliac arteries and proximal external internal iliac arteries. Please correlate with any symptoms and additional evaluation as clinically appropriate such as CT angiogram. SKELETON: No specific abnormal uptake identified along the visualized osseous structures. Incidental CT findings: Scattered degenerative changes. There is augmentation cement within a compressed vertebral body at T7. There is also severe compression of the L3 level with sclerosis but no abnormal uptake. Please correlate with history. IMPRESSION: Spiculated hypermetabolic left upper lobe mass consistent with neoplasm. Three hypermetabolic lymph nodes identified, 2 along the left side of the mediastinum and 1 along the left hilum worrisome for regional spread  of disease. No additional areas of abnormal uptake elsewhere. Calcified pleural plaques.  Emphysematous lung changes. Diffuse vascular calcifications with dilatation of the common iliac arteries and some aneurysmal dilatation of the inferior abdominal aorta of up to 3.2 cm. In addition there is extensive plaque along the iliac vessels with  potential areas of significant stenosis. Please correlate with any symptoms and if needed follow-up CT angiogram could be performed as clinically appropriate. Recommend follow-up ultrasound every 3 years for the abdominal aortic aneurysm. (Ref.: J Vasc Surg. 2018; 67:2-77 and J Am Coll Radiol 2013;10(10):789-794.) Mucosal thickening of the right maxillary sinus with an air-fluid level. Please correlate for any clinical evidence of sinusitis. Colonic diverticula Electronically Signed   By: Ranell Bring M.D.   On: 03/06/2024 13:34     Past medical hx Past Medical History:  Diagnosis Date   Anxiety    Bronchitis    Cancer (HCC) 2016   COPD (chronic obstructive pulmonary disease) (HCC)    Cough 07/13/2007   Qualifier: Diagnosis of  By: Georgian ROSALEA CHARM Lamar    Dyspnea    due to copd   Hypertension    pt denies this and is not on bp meds but vascular note states htn   LUNG NODULE 07/13/2007   Qualifier: Diagnosis of  By: Georgian ROSALEA CHARM Lamar    Pneumonia 2013   Thoracic aortic aneurysm [facility])    followed by vascular     Social History   Tobacco Use   Smoking status: Every Day    Current packs/day: 0.50    Average packs/day: 0.5 packs/day for 60.0 years (30.0 ttl pk-yrs)    Types: Cigarettes   Smokeless tobacco: Never   Tobacco comments:    10cigarettes daily- AJP 03/04/24  Vaping Use   Vaping status: Never Used  Substance Use Topics   Alcohol  use: Yes    Alcohol /week: 12.0 standard drinks of alcohol     Types: 12 Standard drinks or equivalent per week    Comment: Drinks 3-4 beers every night   Drug use: No    Mr.Wellman Alvia reports that he has been smoking cigarettes. He has a 30 pack-year smoking history. He has never used smokeless tobacco. He reports current alcohol  use of about 12.0 standard drinks of alcohol  per week. He reports that he does not use drugs.  Tobacco Cessation: Ready to quit: Not Answered Counseling given: Not Answered Tobacco comments: 10cigarettes daily- AJP  03/04/24 Current every day smoker   Past surgical hx, Family hx, Social hx all reviewed.  Current Outpatient Medications on File Prior to Visit  Medication Sig   albuterol  (VENTOLIN  HFA) 108 (90 Base) MCG/ACT inhaler Inhale 2 puffs into the lungs every 6 (six) hours as needed.   aspirin  EC 81 MG tablet Take 81 mg by mouth daily. Swallow whole.   budesonide-glycopyrrolate -formoterol (BREZTRI  AEROSPHERE) 160-9-4.8 MCG/ACT AERO inhaler Inhale 2 puffs into the lungs in the morning and at bedtime.   chlorpheniramine-HYDROcodone  (TUSSIONEX) 10-8 MG/5ML Take 5 mLs by mouth at bedtime as needed.   gabapentin  (NEURONTIN ) 800 MG tablet Take 800 mg by mouth 2 (two) times daily.    ibuprofen  (ADVIL ) 800 MG tablet Take 800 mg by mouth every 8 (eight) hours as needed for mild pain.   ipratropium-albuterol  (DUONEB) 0.5-2.5 (3) MG/3ML SOLN Inhale 3 mLs into the lungs every 4 (four) hours as needed (When he get sick and the doctor tells him to take it).   omeprazole (PRILOSEC) 40 MG capsule Take 40 mg by  mouth daily.   oxycodone  (OXY-IR) 5 MG capsule Take 5 mg by mouth every 4 (four) hours as needed.   tamsulosin (FLOMAX) 0.4 MG CAPS capsule Take 0.4 mg by mouth at bedtime.   atorvastatin  (LIPITOR ) 80 MG tablet Take 80 mg by mouth daily. (Patient not taking: Reported on 03/18/2024)   clopidogrel  (PLAVIX ) 75 MG tablet Take 1 tablet (75 mg total) by mouth daily. (Patient not taking: Reported on 03/18/2024)   fentaNYL  (DURAGESIC ) 50 MCG/HR Place 1 patch onto the skin every 3 (three) days. (Patient not taking: Reported on 03/18/2024)   HYDROcodone -acetaminophen  (NORCO/VICODIN) 5-325 MG tablet Take 1 tablet by mouth every 12 (twelve) hours as needed for moderate pain (pain score 4-6). (Patient not taking: Reported on 03/18/2024)   isosorbide  mononitrate (IMDUR ) 30 MG 24 hr tablet Take 1 tablet (30 mg total) by mouth daily. (Patient not taking: Reported on 03/18/2024)   LORazepam  (ATIVAN ) 0.5 MG tablet Take 0.5 mg by mouth  every 6 (six) hours as needed. (Patient not taking: Reported on 03/18/2024)   metoprolol  succinate (TOPROL -XL) 25 MG 24 hr tablet Take 1 tablet (25 mg total) by mouth daily. (Patient not taking: Reported on 03/18/2024)   mupirocin  ointment (BACTROBAN ) 2 % Apply 1 Application topically 2 (two) times daily. (Patient not taking: Reported on 03/18/2024)   nicotine  polacrilex (NICORETTE ) 2 MG gum Take 1 each (2 mg total) by mouth as needed for smoking cessation. (Patient not taking: Reported on 03/18/2024)   predniSONE (DELTASONE) 10 MG tablet Take 10 mg by mouth. (Patient not taking: Reported on 03/18/2024)   sodium chloride , hypertonic, 3 % solution Inject into the vein continuous. (Patient not taking: Reported on 03/18/2024)   No current facility-administered medications on file prior to visit.     No Active Allergies  Review Of Systems:  Constitutional:   +  weight loss, No night sweats,  Fevers, chills, fatigue, or  lassitude.  HEENT:   No headaches,  Difficulty swallowing,  Tooth/dental problems, or  Sore throat,                No sneezing, itching, ear ache, nasal congestion, post nasal drip,   CV:  No chest pain,  Orthopnea, PND, swelling in lower extremities, anasarca, dizziness, palpitations, syncope.   GI  No heartburn, indigestion, abdominal pain, nausea, vomiting, diarrhea, change in bowel habits, loss of appetite, bloody stools.   Resp: + shortness of breath with exertion less at rest.  + excess mucus, + productive cough,  No non-productive cough,  No coughing up of blood.  No change in color of mucus.  No wheezing.  No chest wall deformity  Skin: no rash or lesions.  GU: no dysuria, change in color of urine, no urgency or frequency.  No flank pain, no hematuria   MS:  No joint pain or swelling.  No decreased range of motion.  No back pain.  Psych:  No change in mood or affect. No depression or anxiety.  No memory loss.   Vital Signs BP 124/83   Pulse 72   Temp 98.1 F (36.7  C) (Oral)   Ht 5' 6 (1.676 m)   Wt 133 lb 6.4 oz (60.5 kg)   SpO2 90%   BMI 21.53 kg/m    Physical Exam:  General- No distress,  A&Ox3, pleasant ENT: No sinus tenderness, TM clear, pale nasal mucosa, no oral exudate,no post nasal drip, no LAN Cardiac: S1, S2, regular rate and rhythm, no murmur Chest: No wheeze/ rales/ dullness;  no accessory muscle use, no nasal flaring, no sternal retractions Abd.: Soft Non-tender, ND, BS +, Body mass index is 21.53 kg/m.  Ext: No clubbing cyanosis, edema, no obvious deformities Neuro:  normal strength, MAE x 4, A&O x 3, appropriate Skin: No rashes, warm and dry, no obvious skin lesions  Psych: normal mood and behavior    Assessment & Plan Left upper lobe pulmonary nodule and mediastinal/hilar lymphadenopathy suspicious for malignancy PET scan indicated uptake in the left upper lobe nodule and mediastinal/hilar lymphadenopathy,  Biopsy recommended to assess nodule and lymph nodes  Plan We have reviewed your PET scan and PFT's  Your PET scan shows several areas of concern . We have discussed that the next best step if for biopsy of this nodule and lymph nodes.  I have placed an order for a bronchoscopy with biopsies.  We have discussed the procedure in detail.  We have reviewed the risks and benefits of the procedure. These include bleeding, infection, puncture of the lung, and adverse reaction to anesthesia. You have agreed to proceed with biopsy to evaluate the left upper lobe nodule, and lymph nodes. Your procedure will be done by Dr. Lamar Chris. You will receive a letter today with date time and information pertaining to the procedure. You will need someone to drive you to the procedure, stay with you during the procedure, and stay with you after the procedure. You will also need someone to stay with you for 24 hours after anesthesia to ensure you have cleared and are doing well. You will follow-up with me 1 week after the procedure to  review the results and to ensure you are doing well. Call if you need us  prior to the procedure or if you have any questions at all. Please contact office for sooner follow up if symptoms do not improve or worsen or seek emergency care    We will complete paperwork for Trelegy to see if you can get financial assistance through the manufacturer.   Call if you have any questions or concerns. Please contact office for sooner follow up if symptoms do not improve or worsen or seek emergency care    History of right upper lobectomy for lung cancer Previous right upper lobectomy for lung cancer.  Concerns about recurrence or new malignancy in left upper lobe and lymph nodes. Previous surgery and chemotherapy poorly tolerated.  Plan  Bronchoscopy with biopsies  Chronic obstructive pulmonary disease (COPD) COPD. Prefers Trelegy over Breztri  due to financial concerns. - Provide financial assistance paperwork for Trelegy. - Continue Breztri  until Trelegy assistance is resolved.  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  History of exposure during [employer]. Plan Surveillance imaging  Tobacco use, current Current smoker, half a pack per day. Plan You can receive free nicotine  replacement therapy (patches, gum, or mints) by calling 1-800-QUIT NOW. Please call so we can get you on the path to becoming a non-smoker. I know it is hard, but you can do this!  Hypnosis for smoking cessation  [employer] [phone]  Acupuncture for smoking cessation  [employer] [phone]    Unintentional weight loss Unintentional weight loss of 15 pounds over the last few months. Concerning for potential malignancy or other underlying condition. Plan Monitor weight Consider Dietary referral if continued weight loss.  I spent 45 minutes dedicated to the care of this patient on the date of this encounter to include pre-visit review of records,  face-to-face time with the  patient discussing conditions above, post visit ordering of testing, clinical documentation with the electronic health record, making appropriate referrals as documented, and communicating necessary information to the patient's healthcare team.       Lauraine JULIANNA Lites, NP 03/18/2024  2:09 PM

## 2024-03-18 NOTE — Patient Instructions (Signed)
 It is good to see you today. We have reviewed your PET scan and PFT's  Your PET scan shows several areas of concern . We have discussed that the next best step if for biopsy of this nodule and lymph nodes.  I have placed an order for a bronchoscopy with biopsies.  We have discussed the procedure in detail.  We have reviewed the risks and benefits of the procedure. These include bleeding, infection, puncture of the lung, and adverse reaction to anesthesia. You have agreed to proceed with biopsy to evaluate the left upper lobe nodule, and lymph nodes. Your procedure will be done by Dr. Lamar Chris. You will receive a letter today with date time and information pertaining to the procedure. You will need someone to drive you to the procedure, stay with you during the procedure, and stay with you after the procedure. You will also need someone to stay with you for 24 hours after anesthesia to ensure you have cleared and are doing well. You will follow-up with me 1 week after the procedure to review the results and to ensure you are doing well. Call if you need us  prior to the procedure or if you have any questions at all. Please contact office for sooner follow up if symptoms do not improve or worsen or seek emergency care    We will complete paperwork for Trelegy to see if you can get financial assistance through the manufacturer.   Call if you have any questions or concerns. Please contact office for sooner follow up if symptoms do not improve or worsen or seek emergency care

## 2024-03-18 NOTE — Telephone Encounter (Signed)
 Please schedule the following:  Provider performing procedure: Dr. Shelah Diagnosis:  Lung nodules  Which side if for nodule / mass?  Left Procedure:  Navigational bronchoscopy with biopsies and EBUS  Has patient been spoken to by Provider and given informed consent? Yes, Lauraine Lites NP Anesthesia:  General  Do you need Fluro?  Yes Duration of procedure:  1.5 Date: 03/26/2024 Alternate Date: There are openings on 8/19  Time: Would like the 11 am time with an 8:30 or 9 arrival Location:  [facility] Does patient have OSA? No DM? No Or Latex allergy? No Medication Restriction/ Anticoagulate/Antiplatelet:  Hold ASA x 48 hours prior to procedure Pre-op Labs Ordered:determined by Anesthesia Imaging request: LDCT 02/25/2024, and CTA 03/10/2024  (If, SuperDimension CT Chest, please have STAT courier sent to [facility])

## 2024-03-18 NOTE — Patient Instructions (Signed)
 Full PFT performed today.

## 2024-03-18 NOTE — H&P (View-Only) (Signed)
 History of Present Illness Stephen Leon is a 78 y.o. male current smoker with COPD, Chronic bronchitis, and abnormal lung cancer screening. He will be followed by Dr. Shelah for bronchoscopy with biopsies. Pt. Is followed by Dr. Isadora in the [facility] office for his COPD. He wanted his biopsies done in [place]. .    03/18/2024 Discussed the use of AI scribe software for clinical note transcription with the patient, who gave verbal consent to proceed.  History of Present Illness Stephen Leon is a 78 year old male with lung cancer who presents with a pulmonary nodule with PET scan uptake. He was referred by Dr. Lenda for further evaluation of a pulmonary nodule after being seen for an acute flare with hypoxemia.  He has a history of lung cancer and previously underwent robotic surgery to remove the right upper lobe. The current concern is a nodule in the left upper lobe, with additional uptake in some lymph nodes in the chest. He has experienced significant weight loss of 15 pounds over the last couple of months.  He currently smokes half a pack per day. He is on a low-dose aspirin  regimen, taking one aspirin  at night. He has been using Breztri , a sample provided by his doctor, but previously used Trelegy, which he preferred. However, Trelegy became unaffordable after the free supply ended.  He has a history of asbestos exposure during his 14 years in the [employer], which has resulted in pleural plaques in his lungs. No sleep apnea, diabetes, or latex allergy. His breathing is generally okay, though exertion causes some difficulty. He has had general anesthesia before, and had no issues.  We have reviewed his PET scan and PFT's. We discussed that the next best step is for bronchoscopy with biopsies of the lung nodule and the lymph nodes. Pt. Is on ASA 81 mg daily. He understands he will need to come off the Aspirin  48 hours prior to the procedure. He verbalized  understanding.  Based on PFT's and previous lobectomy, if this is a malignancy, surgery would most likely not be an option.       Test Results: LDCT 02/16/2024 Lung-RADS 4B, suspicious. Additional imaging evaluation or consultation with Pulmonology or Thoracic Surgery recommended. Irregular solid anterior left upper lobe 11.7 mm pulmonary nodule is significantly increased from 4.0 mm on 01/16/2023 CT. Suggest PET-CT for further characterization at this time. 2. Two-vessel coronary atherosclerosis. 3. 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 Recommend attention on follow-up chest CT angiogram in 1 year. 4. 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 No pleural effusions. 5. Diffuse osteopenia. Chronic moderate T7 vertebral compression fracture status post vertebroplasty. 6. Aortic Atherosclerosis (ICD10-I70.0) and Emphysema (ICD10-J43.9).  PET Scan 03/06/2024 Spiculated hypermetabolic left upper lobe mass consistent with neoplasm. Three hypermetabolic lymph nodes identified, 2 along the left side of the mediastinum and 1 along the left hilum worrisome for regional spread of disease. No additional areas of abnormal uptake elsewhere. Calcified pleural plaques.  Emphysematous lung changes. Diffuse vascular calcifications with dilatation of the common iliac arteries and some aneurysmal dilatation of the inferior abdominal aorta of up to 3.2 cm. In addition there is extensive plaque along the iliac vessels with potential areas of significant stenosis. Please correlate with any symptoms and if needed follow-up CT angiogram could be performed as clinically appropriate. Recommend follow-up ultrasound every 3 years for the abdominal aortic aneurysm. Mucosal thickening of the right maxillary sinus with an air-fluid level. Please correlate  for any clinical evidence of sinusitis.  CTA 03/10/2024 Stable to possibly slightly increased in  size left upper lobe subpleural 1.4 x 1.2 cm (from 1.2 x 1 cm). 2. Grossly stable mediastinal and left hilar lymphadenopathy. 3. 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 4.  Emphysema (ICD10-J43.9).      Latest Ref Rng & Units 03/10/2024   11:03 AM 05/11/2022    6:35 AM 05/10/2022    3:10 PM  CBC  WBC 4.0 - 10.5 K/uL 5.2  9.3  9.7   Hemoglobin 13.0 - 17.0 g/dL 84.2  87.0  86.8   Hematocrit 39.0 - 52.0 % 45.8  39.4  40.1   Platelets 150 - 400 K/uL 135  C 212  223     C Corrected result       Latest Ref Rng & Units 03/10/2024   11:03 AM 05/12/2022    5:37 AM 05/11/2022    6:35 AM  BMP  Glucose 70 - 99 mg/dL 91  98  98   BUN 8 - 23 mg/dL 14  17  14    Creatinine 0.61 - 1.24 mg/dL 9.28  9.33  9.29   Sodium 135 - 145 mmol/L 137  139  138   Potassium 3.5 - 5.1 mmol/L 4.2  3.9  4.1   Chloride 98 - 111 mmol/L 99  104  106   CO2 22 - 32 mmol/L 23  27  27    Calcium  8.9 - 10.3 mg/dL 9.2  8.5  8.8     BNP    Component Value Date/Time   BNP 141.1 (H) 05/09/2023 0946    ProBNP No results found for: PROBNP  PFT    Component Value Date/Time   FEV1PRE 1.72 03/18/2024 1225   FEV1POST 1.69 03/18/2024 1225   FVCPRE 3.60 03/18/2024 1225   FVCPOST 3.75 03/18/2024 1225   TLC 6.76 03/18/2024 1225   DLCOUNC 14.48 03/18/2024 1225   PREFEV1FVCRT 48 03/18/2024 1225   PSTFEV1FVCRT 45 03/18/2024 1225    US  Abdomen Limited RUQ (LIVER/GB) Result Date: 03/10/2024 EXAM: Right Upper Quadrant Abdominal Ultrasound 03/10/2024 02:08:25 PM TECHNIQUE: Real-time ultrasonography of the right upper quadrant of the abdomen was performed. COMPARISON: None available. CLINICAL HISTORY: Elevated LFTs FINDINGS: LIVER: The liver demonstrates normal echogenicity. No intrahepatic biliary ductal dilatation. No evidence of mass. BILIARY SYSTEM: There is a 4 mm echogenic polyp along the posterior wall near the fundus of the gallbladder. No pericholecystic fluid or wall thickening. No  cholelithiasis. Negative sonographic Murphy's sign. Common bile duct is within normal limits measuring . RIGHT KIDNEY: The right kidney is grossly unremarkable in appearances without evidence of hydronephrosis, echogenic calculi or worrisome mass lesions. PANCREAS: Visualized portions of the pancreas are unremarkable. OTHER: No right upper quadrant ascites. IMPRESSION: 1. 4 mm echogenic polyp along the posterior wall near the fundus of the gallbladder. Electronically signed by: evalene coho 03/10/2024 02:15 PM EDT RP Workstation: [id number]   CT Angio Chest PE W and/or Wo Contrast Result Date: 03/10/2024 CLINICAL DATA:  Pulmonary embolism (PE) suspected, high prob Pt presents with complaints of chest AND abdominal pain X 1 week. Also endorses SOB, loss of appetite, NANDV, Chills. Hx of lung cancer. Did PET scan on las Wednesday and results showed mass in left upper lobe EXAM: CT ANGIOGRAPHY CHEST WITH CONTRAST TECHNIQUE: Multidetector CT imaging of the chest was performed using the standard protocol during bolus administration of intravenous contrast. Multiplanar CT image reconstructions and MIPs were obtained to evaluate the vascular anatomy.  RADIATION DOSE REDUCTION: This exam was performed according to the departmental dose-optimization program which includes automated exposure control, adjustment of the mA and/or kV according to patient size and/or use of iterative reconstruction technique. CONTRAST:  75mL OMNIPAQUE  IOHEXOL  350 MG/ML SOLN COMPARISON:  Chest x-ray 03/10/2024, PET CT 03/06/2024, x-ray lumbar spine 10/10/2023, CT chest 02/16/2024 FINDINGS: Cardiovascular: Satisfactory opacification of the pulmonary arteries to the segmental level. No evidence of pulmonary embolism. Prominent heart size. No significant pericardial effusion. The ascending thoracic aorta measures up to 4.2 cm-stable. The descending thoracic aorta is normal in caliber. Severe atherosclerotic plaque of the thoracic aorta. No  coronary artery calcifications. Mediastinum/Nodes: Grossly stable pre-vascular lymphadenopathy and left hilar lymphadenopathy measuring up to 1.4 cm in short axis in the mediastinum and 1.1 cm in short axis lung the left hilum. No right hilar or axillary lymph nodes. Thyroid  gland, trachea, and esophagus demonstrate no significant findings. Lungs/Pleura: Moderate centrilobular emphysematous changes. Bibasilar atelectasis. No focal consolidation. Stable to possibly slightly increased in size left upper lobe subpleural 1.4 x 1.2 cm (from 1.2 x 1 cm). No new pulmonary nodule. No pulmonary mass. Redemonstration of bilateral calcified pleural plaques. No pleural effusion. No pneumothorax. Upper Abdomen: Severe atherosclerotic plaque. Stable aneurysmal suprarenal abdominal aorta measuring up to 3.2 cm. Partially visualized infrarenal abdominal aorta aneurysm measuring up to 3.1 cm. No acute abnormality. Musculoskeletal: No chest wall abnormality. No suspicious lytic or blastic osseous lesions. No acute displaced fracture. Chronic T7 fracture status post kyphoplasty. Chronic L2 and L3 compression fractures with worsening of L3 vertebral body height loss. Multilevel degenerative changes of the spine. Review of the MIP images confirms the above findings. IMPRESSION: 1. Stable to possibly slightly increased in size left upper lobe subpleural 1.4 x 1.2 cm (from 1.2 x 1 cm). 2. Grossly stable mediastinal and left hilar lymphadenopathy. 3. 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 4.  Emphysema (ICD10-J43.9). 5. 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 Recommend annual imaging followup by CTA or MRA. This recommendation follows 2010 ACCF/AHA/AATS/ACR/ASA/SCA/SCAI/SIR/STS/SVM Guidelines for the Diagnosis and Management of Patients with Thoracic Aortic Disease. Circulation. 2010; 121: [id number]. Aortic aneurysm NOS (ICD10-I71.9). 6. Stable aneurysmal suprarenal abdominal aorta (3.2 cm close)  and partially visualized infrarenal abdominal aorta (3.1 cm). Recommend follow-up ultrasound every 3 years. (Ref.: J Vasc Surg. 2018; 67:2-77 and J Am Coll Radiol 2013;10(10):789-794.) 7. Aortic Atherosclerosis (ICD10-I70.0). 8. Chronic T7 fracture status post kyphoplasty. Chronic L2 and L3 compression fractures with worsening of L3 vertebral body height loss. Electronically Signed   By: Morgane  Naveau M.D.   On: 03/10/2024 13:12   DG Chest 2 View Result Date: 03/10/2024 EXAM: 2 VIEW(S) XRAY OF THE CHEST 03/10/2024 11:33:00 AM COMPARISON: 04/20/2023 CLINICAL HISTORY: Chest pain. Pt presents with complaints of chest \\T \ abdominal pain X 1 week. Also endorses SOB, loss of appetite, N\T\V, Chills. Hx of lung cancer. Did PET scan on las Wednesday and results showed mass in left upper lobe. FINDINGS: LUNGS AND PLEURA: Coarsened interstitial markings compatible with COPD/emphysema. Bilateral calcified pleural plaques. HEART AND MEDIASTINUM: No acute abnormality of the cardiac and mediastinal silhouettes. Aortic atherosclerosis. BONES AND SOFT TISSUES: Barosomet-augmentation of T7 compression fracture. IMPRESSION: 1. No acute findings. 2. Coarsened interstitial markings compatible with COPD/emphysema. 3. Bilateral calcified pleural plaques. Electronically signed by: Waddell Calk MD 03/10/2024 11:56 AM EDT RP Workstation: [id number]   NM PET Image Initial (PI) Skull Base To Thigh Result Date: 03/06/2024 CLINICAL DATA:  Initial treatment strategy for lung nodule. EXAM: NUCLEAR MEDICINE PET  SKULL BASE TO THIGH TECHNIQUE: 6.6 mCi F-18 FDG was injected intravenously. Full-ring PET imaging was performed from the skull base to thigh after the radiotracer. CT data was obtained and used for attenuation correction and anatomic localization. Fasting blood glucose: 106 mg/dl COMPARISON:  CT 92/88/7974 and older FINDINGS: Mediastinal blood pool activity: SUV max 1.8 Liver activity: SUV max 2.1 NECK: No specific abnormal uptake  seen in the neck including along lymph node change of the submandibular, posterior triangle or internal jugular region. Near symmetric uptake of the visualized intracranial compartment. Incidental CT findings: The parotid glands, submandibular glands and thyroid  glands preserved. Prominent calcifications along the carotid vasculature. Streak artifact related to the patient's dental hardware. Paranasal sinuses are with some mucosal thickening and fluid in the right maxillary sinus. Please correlate for any symptoms of sinusitis. Mastoid air cells are clear. CHEST: On the prior chest CT there is anterior left upper lobe spiculated mass abutting the pleura measuring close to 12 mm. This lesion is again seen on image 67 of series 4 and has uptake with maximum SUV of 5.3 worrisome for neoplasm. No additional areas of abnormal lung uptake identified. There are 2 left-sided mediastinal hypermetabolic nodes towards the AP window region. The slightly larger and posterior focus has maximum SUV 7.2 and on CT image 72 of series 4 this abnormal node measures 20 by 16 mm. Lesion just anterior to this on image 73 measures 20 by 8 mm and has maximum SUV of 6.7. Is also a hypermetabolic anterior left hilar lymph node with maximum SUV of 5.7 on image 76. No specific abnormal uptake above blood pool elsewhere including along the right side of the mediastinum, right hilum or axillary regions. Incidental CT findings: Please correlate with prior standard CT. There are calcified pleural plaques bilaterally. No pleural effusion. Mild left basilar atelectasis with some ground-glass. Emphysematous lung changes identified. Volume loss along middle lobe. Scattered vascular calcifications. Heart is nonenlarged. Trace pericardial fluid or thickening. Coronary artery calcifications are seen. ABDOMEN/PELVIS: No abnormal hypermetabolic activity within the liver, pancreas, adrenal glands, or spleen. No hypermetabolic lymph nodes in the abdomen or  pelvis. Incidental CT findings: On this limited CT for attenuation correction without contrast, the liver, spleen, adrenal glands are unremarkable. There is some atrophy of the pancreas. Gallbladder is present. Slightly dense renal pyramids bilaterally, nonspecific. No definite ureteral stone. Slight trabeculation of the wall of the urinary bladder with a prominent prostate. Large bowel has a normal course and caliber with scattered stool. Normal appendix. Left-sided colonic diverticula. Stomach and small bowel are nondilated. Diffuse vascular calcifications identified along the aorta and branch vessels. Mild dilatation of the abdominal aorta measuring up to 3.0 x 3.1 cm. There is ectasia of the common iliac arteries, on the right of 2.2 cm and left 1.7 cm. Again significant calcified plaque with potential areas of stenosis seen particularly along the common iliac arteries and proximal external internal iliac arteries. Please correlate with any symptoms and additional evaluation as clinically appropriate such as CT angiogram. SKELETON: No specific abnormal uptake identified along the visualized osseous structures. Incidental CT findings: Scattered degenerative changes. There is augmentation cement within a compressed vertebral body at T7. There is also severe compression of the L3 level with sclerosis but no abnormal uptake. Please correlate with history. IMPRESSION: Spiculated hypermetabolic left upper lobe mass consistent with neoplasm. Three hypermetabolic lymph nodes identified, 2 along the left side of the mediastinum and 1 along the left hilum worrisome for regional spread of disease.  No additional areas of abnormal uptake elsewhere. Calcified pleural plaques.  Emphysematous lung changes. Diffuse vascular calcifications with dilatation of the common iliac arteries and some aneurysmal dilatation of the inferior abdominal aorta of up to 3.2 cm. In addition there is extensive plaque along the iliac vessels with  potential areas of significant stenosis. Please correlate with any symptoms and if needed follow-up CT angiogram could be performed as clinically appropriate. Recommend follow-up ultrasound every 3 years for the abdominal aortic aneurysm. (Ref.: J Vasc Surg. 2018; 67:2-77 and J Am Coll Radiol 2013;10(10):789-794.) Mucosal thickening of the right maxillary sinus with an air-fluid level. Please correlate for any clinical evidence of sinusitis. Colonic diverticula Electronically Signed   By: Ranell Bring M.D.   On: 03/06/2024 13:34     Past medical hx Past Medical History:  Diagnosis Date   Anxiety    Bronchitis    Cancer (HCC) 2016   COPD (chronic obstructive pulmonary disease) (HCC)    Cough 07/13/2007   Qualifier: Diagnosis of  By: Georgian ROSALEA CHARM Lamar    Dyspnea    due to copd   Hypertension    pt denies this and is not on bp meds but vascular note states htn   LUNG NODULE 07/13/2007   Qualifier: Diagnosis of  By: Georgian ROSALEA CHARM Lamar    Pneumonia 2013   Thoracic aortic aneurysm [facility])    followed by vascular     Social History   Tobacco Use   Smoking status: Every Day    Current packs/day: 0.50    Average packs/day: 0.5 packs/day for 60.0 years (30.0 ttl pk-yrs)    Types: Cigarettes   Smokeless tobacco: Never   Tobacco comments:    10cigarettes daily- AJP 03/04/24  Vaping Use   Vaping status: Never Used  Substance Use Topics   Alcohol  use: Yes    Alcohol /week: 12.0 standard drinks of alcohol     Types: 12 Standard drinks or equivalent per week    Comment: Drinks 3-4 beers every night   Drug use: No    Mr.Wellman Alvia reports that he has been smoking cigarettes. He has a 30 pack-year smoking history. He has never used smokeless tobacco. He reports current alcohol  use of about 12.0 standard drinks of alcohol  per week. He reports that he does not use drugs.  Tobacco Cessation: Ready to quit: Not Answered Counseling given: Not Answered Tobacco comments: 10cigarettes daily- AJP  03/04/24 Current every day smoker   Past surgical hx, Family hx, Social hx all reviewed.  Current Outpatient Medications on File Prior to Visit  Medication Sig   albuterol  (VENTOLIN  HFA) 108 (90 Base) MCG/ACT inhaler Inhale 2 puffs into the lungs every 6 (six) hours as needed.   aspirin  EC 81 MG tablet Take 81 mg by mouth daily. Swallow whole.   budesonide-glycopyrrolate -formoterol (BREZTRI  AEROSPHERE) 160-9-4.8 MCG/ACT AERO inhaler Inhale 2 puffs into the lungs in the morning and at bedtime.   chlorpheniramine-HYDROcodone  (TUSSIONEX) 10-8 MG/5ML Take 5 mLs by mouth at bedtime as needed.   gabapentin  (NEURONTIN ) 800 MG tablet Take 800 mg by mouth 2 (two) times daily.    ibuprofen  (ADVIL ) 800 MG tablet Take 800 mg by mouth every 8 (eight) hours as needed for mild pain.   ipratropium-albuterol  (DUONEB) 0.5-2.5 (3) MG/3ML SOLN Inhale 3 mLs into the lungs every 4 (four) hours as needed (When he get sick and the doctor tells him to take it).   omeprazole (PRILOSEC) 40 MG capsule Take 40 mg by mouth daily.  oxycodone  (OXY-IR) 5 MG capsule Take 5 mg by mouth every 4 (four) hours as needed.   tamsulosin (FLOMAX) 0.4 MG CAPS capsule Take 0.4 mg by mouth at bedtime.   atorvastatin  (LIPITOR ) 80 MG tablet Take 80 mg by mouth daily. (Patient not taking: Reported on 03/18/2024)   clopidogrel  (PLAVIX ) 75 MG tablet Take 1 tablet (75 mg total) by mouth daily. (Patient not taking: Reported on 03/18/2024)   fentaNYL  (DURAGESIC ) 50 MCG/HR Place 1 patch onto the skin every 3 (three) days. (Patient not taking: Reported on 03/18/2024)   HYDROcodone -acetaminophen  (NORCO/VICODIN) 5-325 MG tablet Take 1 tablet by mouth every 12 (twelve) hours as needed for moderate pain (pain score 4-6). (Patient not taking: Reported on 03/18/2024)   isosorbide  mononitrate (IMDUR ) 30 MG 24 hr tablet Take 1 tablet (30 mg total) by mouth daily. (Patient not taking: Reported on 03/18/2024)   LORazepam  (ATIVAN ) 0.5 MG tablet Take 0.5 mg by mouth  every 6 (six) hours as needed. (Patient not taking: Reported on 03/18/2024)   metoprolol  succinate (TOPROL -XL) 25 MG 24 hr tablet Take 1 tablet (25 mg total) by mouth daily. (Patient not taking: Reported on 03/18/2024)   mupirocin  ointment (BACTROBAN ) 2 % Apply 1 Application topically 2 (two) times daily. (Patient not taking: Reported on 03/18/2024)   nicotine  polacrilex (NICORETTE ) 2 MG gum Take 1 each (2 mg total) by mouth as needed for smoking cessation. (Patient not taking: Reported on 03/18/2024)   predniSONE (DELTASONE) 10 MG tablet Take 10 mg by mouth. (Patient not taking: Reported on 03/18/2024)   sodium chloride , hypertonic, 3 % solution Inject into the vein continuous. (Patient not taking: Reported on 03/18/2024)   No current facility-administered medications on file prior to visit.     No Active Allergies  Review Of Systems:  Constitutional:   +  weight loss, No night sweats,  Fevers, chills, fatigue, or  lassitude.  HEENT:   No headaches,  Difficulty swallowing,  Tooth/dental problems, or  Sore throat,                No sneezing, itching, ear ache, nasal congestion, post nasal drip,   CV:  No chest pain,  Orthopnea, PND, swelling in lower extremities, anasarca, dizziness, palpitations, syncope.   GI  No heartburn, indigestion, abdominal pain, nausea, vomiting, diarrhea, change in bowel habits, loss of appetite, bloody stools.   Resp: + shortness of breath with exertion less at rest.  + excess mucus, + productive cough,  No non-productive cough,  No coughing up of blood.  No change in color of mucus.  No wheezing.  No chest wall deformity  Skin: no rash or lesions.  GU: no dysuria, change in color of urine, no urgency or frequency.  No flank pain, no hematuria   MS:  No joint pain or swelling.  No decreased range of motion.  No back pain.  Psych:  No change in mood or affect. No depression or anxiety.  No memory loss.   Vital Signs BP 124/83   Pulse 72   Temp 98.1 F (36.7  C) (Oral)   Ht 5' 6 (1.676 m)   Wt 133 lb 6.4 oz (60.5 kg)   SpO2 90%   BMI 21.53 kg/m    Physical Exam:  General- No distress,  A&Ox3, pleasant ENT: No sinus tenderness, TM clear, pale nasal mucosa, no oral exudate,no post nasal drip, no LAN Cardiac: S1, S2, regular rate and rhythm, no murmur Chest: No wheeze/ rales/ dullness; no accessory muscle use,  no nasal flaring, no sternal retractions Abd.: Soft Non-tender, ND, BS +, Body mass index is 21.53 kg/m.  Ext: No clubbing cyanosis, edema, no obvious deformities Neuro:  normal strength, MAE x 4, A&O x 3, appropriate Skin: No rashes, warm and dry, no obvious skin lesions  Psych: normal mood and behavior    Assessment & Plan Left upper lobe pulmonary nodule and mediastinal/hilar lymphadenopathy suspicious for malignancy PET scan indicated uptake in the left upper lobe nodule and mediastinal/hilar lymphadenopathy,  Biopsy recommended to assess nodule and lymph nodes  Plan We have reviewed your PET scan and PFT's  Your PET scan shows several areas of concern . We have discussed that the next best step if for biopsy of this nodule and lymph nodes.  I have placed an order for a bronchoscopy with biopsies.  We have discussed the procedure in detail.  We have reviewed the risks and benefits of the procedure. These include bleeding, infection, puncture of the lung, and adverse reaction to anesthesia. You have agreed to proceed with biopsy to evaluate the left upper lobe nodule, and lymph nodes. Your procedure will be done by Dr. Lamar Chris. You will receive a letter today with date time and information pertaining to the procedure. You will need someone to drive you to the procedure, stay with you during the procedure, and stay with you after the procedure. You will also need someone to stay with you for 24 hours after anesthesia to ensure you have cleared and are doing well. You will follow-up with me 1 week after the procedure to  review the results and to ensure you are doing well. Call if you need us  prior to the procedure or if you have any questions at all. Please contact office for sooner follow up if symptoms do not improve or worsen or seek emergency care    We will complete paperwork for Trelegy to see if you can get financial assistance through the manufacturer.   Call if you have any questions or concerns. Please contact office for sooner follow up if symptoms do not improve or worsen or seek emergency care    History of right upper lobectomy for lung cancer Previous right upper lobectomy for lung cancer.  Concerns about recurrence or new malignancy in left upper lobe and lymph nodes. Previous surgery and chemotherapy poorly tolerated.  Plan  Bronchoscopy with biopsies  Chronic obstructive pulmonary disease (COPD) COPD. Prefers Trelegy over Breztri  due to financial concerns. - Provide financial assistance paperwork for Trelegy. - Continue Breztri  until Trelegy assistance is resolved.  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  History of exposure during [employer]. Plan Surveillance imaging  Tobacco use, current Current smoker, half a pack per day. Plan You can receive free nicotine  replacement therapy (patches, gum, or mints) by calling 1-800-QUIT NOW. Please call so we can get you on the path to becoming a non-smoker. I know it is hard, but you can do this!  Hypnosis for smoking cessation  [employer] [phone]  Acupuncture for smoking cessation  [employer] [phone]    Unintentional weight loss Unintentional weight loss of 15 pounds over the last few months. Concerning for potential malignancy or other underlying condition. Plan Monitor weight Consider Dietary referral if continued weight loss.  I spent 45 minutes dedicated to the care of this patient on the date of this encounter to include pre-visit review of records,  face-to-face time with the patient discussing conditions above,  post visit ordering of testing, clinical documentation with the electronic health record, making appropriate referrals as documented, and communicating necessary information to the patient's healthcare team.       Lauraine JULIANNA Lites, NP 03/18/2024  2:09 PM

## 2024-03-18 NOTE — Addendum Note (Signed)
 Addended by: MELVENIA WILFORD SAUNDERS on: 03/18/2024 03:42 PM   Modules accepted: Orders

## 2024-03-21 ENCOUNTER — Ambulatory Visit: Payer: Self-pay | Admitting: Pulmonary Disease

## 2024-03-22 ENCOUNTER — Encounter (HOSPITAL_COMMUNITY): Payer: Self-pay | Admitting: Emergency Medicine

## 2024-03-25 ENCOUNTER — Ambulatory Visit: Admitting: Acute Care

## 2024-03-25 ENCOUNTER — Other Ambulatory Visit: Payer: Self-pay

## 2024-03-25 ENCOUNTER — Encounter (HOSPITAL_COMMUNITY): Payer: Self-pay | Admitting: Emergency Medicine

## 2024-03-25 NOTE — Progress Notes (Signed)
 PCP - Dr Tamra Leventhal Cardiologist - Dr Marsa Dooms Pulmonology - Dr Isadora  Chest x-ray - 03/10/24 EKG - 03/10/24 Stress Test - none ECHO - 05/11/22 Cardiac Cath - 05/11/22 PFT - 03/18/24  ICD Pacemaker/Loop - n/a  Sleep Study -  n/a  Diabetes - n/a  Blood Thinner Instructions:  n/a  Aspirin  Instructions: Hold 48 hours prior to procedure.  Last dose was on Sat., 03/23/24.  NPO  Anesthesia review: Yes  STOP now taking any Aspirin  (unless otherwise instructed by your surgeon), Aleve, Naproxen, Ibuprofen , Motrin , Advil , Goody's, BC's, all herbal medications, fish oil, and all vitamins.   Coronavirus Screening Do you have any of the following symptoms:  Cough - occasional Fever (>100.62F)  yes/no: No Runny nose yes/no: No Sore throat yes/no: No Difficulty breathing/shortness of breath  yes/no: Yes  Have you traveled in the last 14 days and where? yes/no: No  Patient verbalized understanding of instructions that were given to them at the PAT appointment. Patient was also instructed that they will need to review over the PAT instructions again at home before surgery.

## 2024-03-25 NOTE — Progress Notes (Signed)
 Anesthesia Chart Review: Stephen Leon  Case: [id number] Date/Time: 03/26/24 1115   Procedures:      VIDEO BRONCHOSCOPY WITH ENDOBRONCHIAL NAVIGATION (Left)     ENDOBRONCHIAL ULTRASOUND (EBUS) (Left)   Anesthesia type: General   Diagnosis: Lung nodule seen on imaging study [R91.1]   Pre-op diagnosis: LUNG NODULES   [facility] / [facility]   Surgeons: [name], MD       DISCUSSION: Patient is a 78 year old male scheduled for the above procedure.  History includes smoking, COPD, lung cancer (RU lobectomy, mediastinal LN dissection 05/17/12), exertional dyspnea, HTN, ascending thoracic aortic aneurysm (4.2 cm 03/10/2024), CAD (late presentation NSTEMI, occluded OM1, medical therapy 05/11/2022), GERD, T7 kyphoplasty (12/13/2018). Regular alcohol  use (3-4 beers/night, PCP notes suggest 6/night).   Last cardiology visit was on 09/26/2023 with Dr. Ammon for follow-up CAD.  Cardiac catheterization 05/19/2020 revealed near 75% stenosis OM 1 with negative FFR. In 2023 he was in [place], where he apparently experienced NSTEMI with troponin reportedly greater than 2000, but refused work-up until he returned to the [place] . He was admitted to [facility] and underwent cardiac catheterization 05/11/2022 which revealed occluded OM1, treated medically with preserved left ventricular function. The patient developed a severely pruritic rash after starting on DAPT, metoprolol , Imdur , and atorvastatin , which resolved when all medications were held. He was started on prasugrel with continued rash, and eventually he discontinued all of his cardiac medications with resolution of rash. At last visit, no new changes made. Six month follow-up planned.   He was seen on 03/04/2024 for an acute pulmonology visit for increased dyspnea with O2 sat 88% at urgent care. He continued to smoke ~ 1/2 ppd/day. A recent chest CT had revealed a new LUL lung nodule and had pending PET scan.  He had not been able to  afford Trelegy, and was given samples of Breztri . By 03/10/24, he went to the ED with additional symptoms of cough, chills, loss of appetite, chest/upper abdominal pains. Troponins negative x2. Flu, COVID, RSV testing negative. CXR negative for PNA. CTA was negative for PE or acute TAA/AAA changes. Abd US  showed a gallbladder polyp. 03/06/24 PET scan had resulted by then and showed a spiculated LUL mass felt consistent with neoplasm. There were also some hypermetabolic nodules worrisome for regional spread. He apparently has also ad about 15 lb weight loss over the past several months. Provider encouraged follow-up with pulmonology asap for next steps, and robotic bronchoscopy and EBUS recommended.   LFTs were elevated at his 03/10/24 ED visit, with ALT 143, AST 170, alk pho 477. Creatinine 0.71, H/H 15.7/45.8, PLT 135K. Comparison labs from 01/11/24 showed normal LFTs, A1c 5.4%, TSH 0.490.   Last ASA documented as 03/23/2024.  Anesthesia team to evaluate on the day of surgery.  Will order repeat labs since PLT and LFTs abnormal in ED.    VS:  Wt Readings from Last 3 Encounters:  03/18/24 60.5 kg  03/04/24 60.5 kg  03/04/24 61.6 kg   BP Readings from Last 3 Encounters:  03/18/24 124/83  03/10/24 134/83  03/04/24 122/70   Pulse Readings from Last 3 Encounters:  03/18/24 72  03/10/24 70  03/04/24 67     PROVIDERS: [name], MD is PCP  Ammon Blunt, MD is cardiologist Isadora Hose, MD is pulmonlogist Damian Kung, MD is endocrinologist   LABS: See DISCUSSION. Most recent results in [facility] include: Lab Results  Component Value Date   WBC 5.2 03/10/2024   HGB 15.7  03/10/2024   HCT 45.8 03/10/2024   PLT 135 (L) 03/10/2024   GLUCOSE 91 03/10/2024   CHOL 191 07/06/2007   TRIG 165 (H) 07/06/2007   HDL 59.5 07/06/2007   LDLCALC 99 07/06/2007   ALT 143 (H) 03/10/2024   AST 170 (H) 03/10/2024   NA 137 03/10/2024   K 4.2 03/10/2024   CL 99 03/10/2024   CREATININE 0.71  03/10/2024   BUN 14 03/10/2024   CO2 23 03/10/2024   TSH 0.69 10/02/2023   PSA 2.40 08/17/2007   INR 1.0 05/10/2022    PFTs 03/18/2024: FVC 3.60 (101%), post 3.75 (105%). FEV1 1.72 (67%), post 1.69 (66%). DLCO unc 14.48 (65%), cor 14.06 (63%).    IMAGES: US  Abd RUQ 03/10/2024: IMPRESSION: 1. 4 mm echogenic polyp along the posterior wall near the fundus of the gallbladder.  CTA Chest 03/10/2024: IMPRESSION: 1. Stable to possibly slightly increased in size left upper lobe subpleural 1.4 x 1.2 cm (from 1.2 x 1 cm). 2. Grossly stable mediastinal and left hilar lymphadenopathy. 3. 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 4.  Emphysema (ICD10-J43.9). 5. 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 Recommend annual imaging followup by CTA or MRA. This recommendation follows 2010 ACCF/AHA/AATS/ACR/ASA/SCA/SCAI/SIR/STS/SVM Guidelines for the Diagnosis and Management of Patients with Thoracic Aortic Disease. Circulation. 2010; 121: Z733-z630. Aortic aneurysm NOS (ICD10-I71.9). 6. 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 Recommend follow-up ultrasound every 3 years. (Ref.: J Vasc Surg. 2018; 67:2-77 and J Am Coll Radiol 2013;10(10):789-794.) 7. Aortic Atherosclerosis (ICD10-I70.0). 8. Chronic T7 fracture status post kyphoplasty. Chronic L2 and L3 compression fractures with worsening of L3 vertebral body height loss.  PET Scan 03/06/2024: IMPRESSION: - Spiculated hypermetabolic left upper lobe mass consistent with neoplasm. - Three hypermetabolic lymph nodes identified, 2 along the left side of the mediastinum and 1 along the left hilum worrisome for regional spread of disease. No additional areas of abnormal uptake elsewhere. - Calcified pleural plaques.  Emphysematous lung changes. - Diffuse vascular calcifications with dilatation of the common iliac arteries and some  aneurysmal dilatation of the inferior abdominal aorta of up to 3.2 cm. In addition there is extensive plaque along the iliac vessels with potential areas of significant stenosis. Please correlate with any symptoms and if needed follow-up CT angiogram could be performed as clinically appropriate. Recommend follow-up ultrasound every 3 years for the abdominal aortic aneurysm. (Ref.: J Vasc Surg. 2018; 67:2-77 and J Am Coll Radiol 2013;10(10):789-794.) - Mucosal thickening of the right maxillary sinus with an air-fluid level. Please correlate for any clinical evidence of sinusitis. - Colonic diverticula    EKG: 03/10/2024: Sinus rhythm Multiform ventricular premature complexes Left anterior fascicular block Abnormal R-wave progression, late transition Confirmed by Patsey Lot [phone]) on 03/10/2024 11:00:10 AM   CV: Echo 05/11/2022: IMPRESSIONS   1. Left ventricular ejection fraction, by estimation, is 60 to 65%. The  left ventricle has normal function. The left ventricle has no regional  wall motion abnormalities. Left ventricular diastolic parameters are  consistent with Grade I diastolic  dysfunction (impaired relaxation).   2. Right ventricular systolic function is normal. The right ventricular  size is normal.   3. The mitral valve is normal in structure. Trivial mitral valve  regurgitation. No evidence of mitral stenosis.   4. The aortic valve is normal in structure. Aortic valve regurgitation is  not visualized. No aortic stenosis is present.   5. The inferior vena cava is normal  in size with greater than 50%  respiratory variability, suggesting right atrial pressure of 3 mmHg.    Cardiac cath 05/11/2022:   1st Mrg lesion is 100% stenosed.   Prox LAD lesion is 30% stenosed.   The left ventricular systolic function is normal.   LV end diastolic pressure is mildly elevated.   The left ventricular ejection fraction is 55-65% by visual estimate.   1.  NSTEMI, late  presentation (1 week ) 2.  Occluded OM1 3.  Preserved left ventricular function   Recommendations 1.  Medical therapy.  PCI of occluded OM1 be technically challenging, and could jeopardize large dominant left circumflex. 2.  Continue dual antiplatelet therapy 3.  Start metoprolol  succinate 25 mg daily 4.  Start isosorbide  mononitrate 30 mg daily 5.  Plan for discharge in a.m. 6.  Follow-up 1 week after discharge   US  Carotid 03/26/2009: IMPRESSION:  1. No hemodynamically significant stenosis is seen on either side.  2. A small amount of soft plaque is noted about the carotid bifurcations  bilaterally.  3. There is antegrade flow in both vertebrals.   Past Medical History:  Diagnosis Date   Anxiety    Bronchitis    Cancer (HCC) 2016   lung   COPD (chronic obstructive pulmonary disease) (HCC)    Coronary artery disease 09/26/2023   Cough 07/13/2007   Qualifier: Diagnosis of  By: Georgian ROSALEA CHARM Lamar    Dyspnea    due to copd - w/exertion   GERD (gastroesophageal reflux disease)    Hypertension    pt denies this and is not on bp meds but vascular note states htn   LUNG NODULE 07/13/2007   Qualifier: Diagnosis of  By: Georgian ROSALEA CHARM Lamar    Myocardial infarction [facility]) 2023   mild per patient   Nodule of left lung 03/2024   upper lobe   Pneumonia 2013   Thoracic aortic aneurysm [facility])    followed by vascular    Past Surgical History:  Procedure Laterality Date   BIOPSY  07/13/2023   Procedure: BIOPSY;  Surgeon: Onita Elspeth Sharper, DO;  Location: [facility] ENDOSCOPY;  Service: Gastroenterology;;   COLONOSCOPY N/A 10/27/2022   hx polyps; Procedure: COLONOSCOPY;  Surgeon: Onita Elspeth Sharper, DO;  Location: [facility] ENDOSCOPY;  Service: Gastroenterology;  Laterality: N/A;   COLONOSCOPY WITH PROPOFOL  N/A 07/13/2023   Procedure: COLONOSCOPY WITH PROPOFOL ;  Surgeon: Onita Elspeth Sharper, DO;  Location[facility] ENDOSCOPY;  Service: Gastroenterology;  Laterality: N/A;   CORONARY  PRESSURE/FFR STUDY N/A 05/19/2020   Procedure: INTRAVASCULAR PRESSURE WIRE/FFR STUDY;  Surgeon: Ammon Blunt, MD;  Location: [facility] INVASIVE CV LAB;  Service: Cardiovascular;  Laterality: N/A;   DIAGNOSTIC LAPAROSCOPY     ESOPHAGOGASTRODUODENOSCOPY N/A 10/27/2022   Procedure: ESOPHAGOGASTRODUODENOSCOPY (EGD);  Surgeon: Onita Elspeth Sharper, DO;  Location: [facility] ENDOSCOPY;  Service: Gastroenterology;  Laterality: N/A;   EYE SURGERY Bilateral    HERNIA REPAIR     KYPHOPLASTY N/A 12/13/2018   Procedure: KYPHOPLASTY- T7;  Surgeon: Kathlynn Sharper, MD;  Location: [facility] ORS;  Service: Orthopedics;  Laterality: N/A;   LEFT HEART CATH N/A 05/11/2022   Procedure: Left Heart Cath;  Surgeon: Ammon Blunt, MD;  Location: [facility] INVASIVE CV LAB;  Service: Cardiovascular;  Laterality: N/A;   LEFT HEART CATH AND CORONARY ANGIOGRAPHY Left 05/19/2020   Procedure: LEFT HEART CATH AND CORONARY ANGIOGRAPHY;  Surgeon: Ammon Blunt, MD;  Location: [facility] INVASIVE CV LAB;  Service: Cardiovascular;  Laterality: Left;   Robotic assisted Right upper lobectomy Right  05/17/2012   [facility]   XI ROBOTIC ASSISTED INGUINAL HERNIA REPAIR WITH MESH Right 07/19/2019   Procedure: XI ROBOTIC ASSISTED INGUINAL HERNIA REPAIR WITH MESH;  Surgeon: Tye Millet, DO;  Location: [facility] ORS;  Service: General;  Laterality: Right;    MEDICATIONS: Only medications listed as currently taking are albuterol  as needed, ASA 81 mg daily (on hold), Breztri , as needed Tussionex, as needed ibuprofen , as needed Duoneb, Prilosec, Flomax. No current facility-administered medications for this encounter.    albuterol  (VENTOLIN  HFA) 108 (90 Base) MCG/ACT inhaler   aspirin  EC 81 MG tablet   atorvastatin  (LIPITOR ) 80 MG tablet   budesonide-glycopyrrolate -formoterol (BREZTRI  AEROSPHERE) 160-9-4.8 MCG/ACT AERO inhaler   budesonide-glycopyrrolate -formoterol (BREZTRI  AEROSPHERE) 160-9-4.8 MCG/ACT AERO inhaler    budesonide-glycopyrrolate -formoterol (BREZTRI  AEROSPHERE) 160-9-4.8 MCG/ACT AERO inhaler   chlorpheniramine-HYDROcodone  (TUSSIONEX) 10-8 MG/5ML   clopidogrel  (PLAVIX ) 75 MG tablet   fentaNYL  (DURAGESIC ) 50 MCG/HR   gabapentin  (NEURONTIN ) 800 MG tablet   HYDROcodone -acetaminophen  (NORCO/VICODIN) 5-325 MG tablet   ibuprofen  (ADVIL ) 800 MG tablet   ipratropium-albuterol  (DUONEB) 0.5-2.5 (3) MG/3ML SOLN   isosorbide  mononitrate (IMDUR ) 30 MG 24 hr tablet   LORazepam  (ATIVAN ) 0.5 MG tablet   metoprolol  succinate (TOPROL -XL) 25 MG 24 hr tablet   mupirocin  ointment (BACTROBAN ) 2 %   nicotine  polacrilex (NICORETTE ) 2 MG gum   omeprazole (PRILOSEC) 40 MG capsule   oxycodone  (OXY-IR) 5 MG capsule   predniSONE (DELTASONE) 10 MG tablet   sodium chloride , hypertonic, 3 % solution   tamsulosin (FLOMAX) 0.4 MG CAPS capsule     Isaiah Ruder, PA-C Surgical Short Stay/Anesthesiology [facility] Phone [phone] [facility] Phone [phone] 03/25/2024 3:30 PM

## 2024-03-25 NOTE — Anesthesia Preprocedure Evaluation (Signed)
 Anesthesia Evaluation  Patient identified by MRN, date of birth, ID band Patient awake    Reviewed: Allergy & Precautions, NPO status , Patient's Chart, lab work & pertinent test results  History of Anesthesia Complications Negative for: history of anesthetic complications  Airway Mallampati: III  TM Distance: >3 FB Neck ROM: Full    Dental  (+) Dental Advisory Given, Poor Dentition, Chipped, Missing Multiple broken teeth:   Pulmonary neg shortness of breath, COPD,  COPD inhaler, neg recent URI, Current Smoker and Patient abstained from smoking. Lung nodules, NSCLC s/p lobectomy   Pulmonary exam normal breath sounds clear to auscultation       Cardiovascular hypertension, Pt. on home beta blockers and Pt. on medications (-) angina + CAD, + Past MI (2023) and + DOE  (-) Cardiac Stents and (-) CABG  Rhythm:Regular Rate:Normal  HLD, thoracic aortic aneurysm  TTE 05/11/2022: IMPRESSIONS    1. Left ventricular ejection fraction, by estimation, is 60 to 65%. The  left ventricle has normal function. The left ventricle has no regional  wall motion abnormalities. Left ventricular diastolic parameters are  consistent with Grade I diastolic  dysfunction (impaired relaxation).   2. Right ventricular systolic function is normal. The right ventricular  size is normal.   3. The mitral valve is normal in structure. Trivial mitral valve  regurgitation. No evidence of mitral stenosis.   4. The aortic valve is normal in structure. Aortic valve regurgitation is  not visualized. No aortic stenosis is present.   5. The inferior vena cava is normal in size with greater than 50%  respiratory variability, suggesting right atrial pressure of 3 mmHg.   LHC 05/11/2022:   1st Mrg lesion is 100% stenosed.   Prox LAD lesion is 30% stenosed.   The left ventricular systolic function is normal.   LV end diastolic pressure is mildly elevated.   The  left ventricular ejection fraction is 55-65% by visual estimate.   1.  NSTEMI, late presentation (1 week ) 2.  Occluded OM1 3.  Preserved left ventricular function     Neuro/Psych neg Seizures PSYCHIATRIC DISORDERS Anxiety     Chronic pain.   Neuromuscular disease (intercostal neuralgia)    GI/Hepatic Neg liver ROS,GERD  Medicated,,  Endo/Other  negative endocrine ROS    Renal/GU negative Renal ROS     Musculoskeletal   Abdominal   Peds  Hematology  (+) Blood dyscrasia (thrombocytopenia) Lab Results      Component                Value               Date                      WBC                      5.2                 03/10/2024                HGB                      15.7                03/10/2024                HCT  45.8                03/10/2024                MCV                      98.3                03/10/2024                PLT                      135 (L)             03/10/2024              Anesthesia Other Findings   Reproductive/Obstetrics                              Anesthesia Physical Anesthesia Plan  ASA: 4  Anesthesia Plan: General   Post-op Pain Management: Tylenol  PO (pre-op)*   Induction: Intravenous  PONV Risk Score and Plan: 1 and Ondansetron , Dexamethasone , Propofol  infusion, TIVA and Treatment may vary due to age or medical condition  Airway Management Planned: Oral ETT  Additional Equipment:   Intra-op Plan:   Post-operative Plan: Extubation in OR  Informed Consent: I have reviewed the patients History and Physical, chart, labs and discussed the procedure including the risks, benefits and alternatives for the proposed anesthesia with the patient or authorized representative who has indicated his/her understanding and acceptance.     Dental advisory given  Plan Discussed with: CRNA and Anesthesiologist  Anesthesia Plan Comments: (PAT note written 03/25/2024 by Emya Picado, PA-C.  Updated labs for day of surgery since LFTs high and mild thrombocytopenia at ED visit 03/10/24.   Risks of general anesthesia discussed including, but not limited to, sore throat, hoarse voice, chipped/damaged teeth, injury to vocal cords, nausea and vomiting, allergic reactions, lung infection, heart attack, stroke, and death. All questions answered.   )         Anesthesia Quick Evaluation

## 2024-03-26 ENCOUNTER — Ambulatory Visit (HOSPITAL_BASED_OUTPATIENT_CLINIC_OR_DEPARTMENT_OTHER): Payer: Self-pay | Admitting: Vascular Surgery

## 2024-03-26 ENCOUNTER — Encounter (HOSPITAL_COMMUNITY)
Admission: RE | Disposition: A | Discharge status: Home / Self Care | Payer: Self-pay | Source: Home / Self Care | Attending: Emergency Medicine

## 2024-03-26 ENCOUNTER — Ambulatory Visit (HOSPITAL_COMMUNITY)

## 2024-03-26 ENCOUNTER — Encounter (HOSPITAL_COMMUNITY): Payer: Self-pay | Admitting: Emergency Medicine

## 2024-03-26 ENCOUNTER — Ambulatory Visit (HOSPITAL_COMMUNITY): Payer: Self-pay | Admitting: Vascular Surgery

## 2024-03-26 ENCOUNTER — Ambulatory Visit (HOSPITAL_COMMUNITY)
Admission: RE | Admit: 2024-03-26 | Discharge: 2024-03-26 | Disposition: A | Discharge status: Home / Self Care | Attending: Emergency Medicine | Admitting: Emergency Medicine

## 2024-03-26 DIAGNOSIS — E785 Hyperlipidemia, unspecified: Secondary | ICD-10-CM | POA: Diagnosis not present

## 2024-03-26 DIAGNOSIS — Z77090 Contact with and (suspected) exposure to asbestos: Secondary | ICD-10-CM | POA: Diagnosis not present

## 2024-03-26 DIAGNOSIS — J929 Pleural plaque without asbestos: Secondary | ICD-10-CM | POA: Diagnosis not present

## 2024-03-26 DIAGNOSIS — Z48813 Encounter for surgical aftercare following surgery on the respiratory system: Secondary | ICD-10-CM | POA: Diagnosis not present

## 2024-03-26 DIAGNOSIS — Z01818 Encounter for other preprocedural examination: Secondary | ICD-10-CM

## 2024-03-26 DIAGNOSIS — Z79899 Other long term (current) drug therapy: Secondary | ICD-10-CM | POA: Insufficient documentation

## 2024-03-26 DIAGNOSIS — I252 Old myocardial infarction: Secondary | ICD-10-CM | POA: Insufficient documentation

## 2024-03-26 DIAGNOSIS — C3412 Malignant neoplasm of upper lobe, left bronchus or lung: Secondary | ICD-10-CM | POA: Insufficient documentation

## 2024-03-26 DIAGNOSIS — I2510 Atherosclerotic heart disease of native coronary artery without angina pectoris: Secondary | ICD-10-CM | POA: Diagnosis not present

## 2024-03-26 DIAGNOSIS — Z7902 Long term (current) use of antithrombotics/antiplatelets: Secondary | ICD-10-CM | POA: Diagnosis not present

## 2024-03-26 DIAGNOSIS — R911 Solitary pulmonary nodule: Secondary | ICD-10-CM

## 2024-03-26 DIAGNOSIS — I10 Essential (primary) hypertension: Secondary | ICD-10-CM | POA: Insufficient documentation

## 2024-03-26 DIAGNOSIS — C3492 Malignant neoplasm of unspecified part of left bronchus or lung: Secondary | ICD-10-CM | POA: Diagnosis not present

## 2024-03-26 DIAGNOSIS — C801 Malignant (primary) neoplasm, unspecified: Secondary | ICD-10-CM | POA: Diagnosis not present

## 2024-03-26 DIAGNOSIS — Z7982 Long term (current) use of aspirin: Secondary | ICD-10-CM | POA: Insufficient documentation

## 2024-03-26 DIAGNOSIS — F17210 Nicotine dependence, cigarettes, uncomplicated: Secondary | ICD-10-CM | POA: Diagnosis not present

## 2024-03-26 DIAGNOSIS — C771 Secondary and unspecified malignant neoplasm of intrathoracic lymph nodes: Secondary | ICD-10-CM | POA: Diagnosis not present

## 2024-03-26 DIAGNOSIS — R748 Abnormal levels of other serum enzymes: Secondary | ICD-10-CM

## 2024-03-26 DIAGNOSIS — K219 Gastro-esophageal reflux disease without esophagitis: Secondary | ICD-10-CM | POA: Diagnosis not present

## 2024-03-26 DIAGNOSIS — F419 Anxiety disorder, unspecified: Secondary | ICD-10-CM | POA: Diagnosis not present

## 2024-03-26 DIAGNOSIS — R590 Localized enlarged lymph nodes: Secondary | ICD-10-CM | POA: Diagnosis present

## 2024-03-26 DIAGNOSIS — Z5986 Financial insecurity: Secondary | ICD-10-CM | POA: Diagnosis not present

## 2024-03-26 DIAGNOSIS — D696 Thrombocytopenia, unspecified: Secondary | ICD-10-CM

## 2024-03-26 HISTORY — PX: BRONCHIAL BRUSHINGS: SHX5108

## 2024-03-26 HISTORY — PX: BRONCHIAL NEEDLE ASPIRATION BIOPSY: SHX5106

## 2024-03-26 HISTORY — PX: VIDEO BRONCHOSCOPY WITH ENDOBRONCHIAL NAVIGATION: SHX6175

## 2024-03-26 HISTORY — PX: ENDOBRONCHIAL ULTRASOUND: SHX5096

## 2024-03-26 HISTORY — DX: Gastro-esophageal reflux disease without esophagitis: K21.9

## 2024-03-26 LAB — CBC
HCT: 41.7 % (ref 39.0–52.0)
Hemoglobin: 14 g/dL (ref 13.0–17.0)
MCH: 33.2 pg (ref 26.0–34.0)
MCHC: 33.6 g/dL (ref 30.0–36.0)
MCV: 98.8 fL (ref 80.0–100.0)
Platelets: 164 K/uL (ref 150–400)
RBC: 4.22 MIL/uL (ref 4.22–5.81)
RDW: 14.9 % (ref 11.5–15.5)
WBC: 5.1 K/uL (ref 4.0–10.5)
nRBC: 0 % (ref 0.0–0.2)

## 2024-03-26 LAB — COMPREHENSIVE METABOLIC PANEL WITH GFR
ALT: 32 U/L (ref 0–44)
AST: 28 U/L (ref 15–41)
Albumin: 3 g/dL — ABNORMAL LOW (ref 3.5–5.0)
Alkaline Phosphatase: 191 U/L — ABNORMAL HIGH (ref 38–126)
Anion gap: 11 (ref 5–15)
BUN: 13 mg/dL (ref 8–23)
CO2: 21 mmol/L — ABNORMAL LOW (ref 22–32)
Calcium: 8.4 mg/dL — ABNORMAL LOW (ref 8.9–10.3)
Chloride: 105 mmol/L (ref 98–111)
Creatinine, Ser: 0.73 mg/dL (ref 0.61–1.24)
GFR, Estimated: >60 mL/min (ref >60)
Glucose, Bld: 94 mg/dL (ref 70–99)
Potassium: 4.4 mmol/L (ref 3.5–5.1)
Sodium: 137 mmol/L (ref 135–145)
Total Bilirubin: 0.9 mg/dL (ref 0.0–1.2)
Total Protein: 6.7 g/dL (ref 6.5–8.1)

## 2024-03-26 SURGERY — VIDEO BRONCHOSCOPY WITH ENDOBRONCHIAL NAVIGATION
Anesthesia: General | Laterality: Left

## 2024-03-26 MED ORDER — CHLORHEXIDINE GLUCONATE 0.12 % MT SOLN
OROMUCOSAL | Status: AC
  Administered 2024-03-26: 15 mL
  Filled 2024-03-26: qty 15

## 2024-03-26 MED ORDER — ONDANSETRON HCL 4 MG/2ML IJ SOLN
INTRAMUSCULAR | Status: DC | PRN
  Administered 2024-03-26: 4 mg via INTRAVENOUS

## 2024-03-26 MED ORDER — OXYCODONE HCL 5 MG/5ML PO SOLN: 5.0000 mg | Freq: Once | ORAL | Status: DC | PRN

## 2024-03-26 MED ORDER — FENTANYL CITRATE (PF) 250 MCG/5ML IJ SOLN
INTRAMUSCULAR | Status: DC | PRN
  Administered 2024-03-26: 50 ug via INTRAVENOUS

## 2024-03-26 MED ORDER — PROPOFOL 500 MG/50ML IV EMUL
INTRAVENOUS | Status: DC | PRN
  Administered 2024-03-26: 125 ug/kg/min via INTRAVENOUS

## 2024-03-26 MED ORDER — FENTANYL CITRATE (PF) 100 MCG/2ML IJ SOLN: 25.0000 ug | INTRAMUSCULAR | Status: DC | PRN

## 2024-03-26 MED ORDER — OXYCODONE HCL 5 MG PO TABS: 5.0000 mg | ORAL_TABLET | Freq: Once | ORAL | Status: DC | PRN

## 2024-03-26 MED ORDER — PROPOFOL 10 MG/ML IV BOLUS
INTRAVENOUS | Status: DC | PRN
  Administered 2024-03-26: 150 mg via INTRAVENOUS
  Administered 2024-03-26: 50 mg via INTRAVENOUS
  Administered 2024-03-26: 30 mg via INTRAVENOUS
  Administered 2024-03-26: 20 mg via INTRAVENOUS
  Administered 2024-03-26: 30 mg via INTRAVENOUS
  Administered 2024-03-26: 20 mg via INTRAVENOUS

## 2024-03-26 MED ORDER — SUGAMMADEX SODIUM 200 MG/2ML IV SOLN
INTRAVENOUS | Status: DC | PRN
  Administered 2024-03-26: 200 mg via INTRAVENOUS

## 2024-03-26 MED ORDER — LACTATED RINGERS IV SOLN: INTRAVENOUS | Status: DC | PRN

## 2024-03-26 MED ORDER — DEXAMETHASONE SODIUM PHOSPHATE 10 MG/ML IJ SOLN
INTRAMUSCULAR | Status: DC | PRN
  Administered 2024-03-26: 10 mg via INTRAVENOUS

## 2024-03-26 MED ORDER — ROCURONIUM BROMIDE 10 MG/ML (PF) SYRINGE
PREFILLED_SYRINGE | INTRAVENOUS | Status: DC | PRN
  Administered 2024-03-26: 10 mg via INTRAVENOUS
  Administered 2024-03-26: 50 mg via INTRAVENOUS

## 2024-03-26 MED ORDER — PHENYLEPHRINE 80 MCG/ML (10ML) SYRINGE FOR IV PUSH (FOR BLOOD PRESSURE SUPPORT)
PREFILLED_SYRINGE | INTRAVENOUS | Status: DC | PRN
  Administered 2024-03-26 (×4): 160 ug via INTRAVENOUS

## 2024-03-26 MED ORDER — ACETAMINOPHEN 500 MG PO TABS
1000.0000 mg | ORAL_TABLET | Freq: Once | ORAL | Status: AC
  Administered 2024-03-26: 1000 mg via ORAL
  Filled 2024-03-26: qty 2

## 2024-03-26 MED ORDER — FENTANYL CITRATE (PF) 100 MCG/2ML IJ SOLN
INTRAMUSCULAR | Status: AC
  Filled 2024-03-26: qty 2

## 2024-03-26 MED ORDER — LIDOCAINE 2% (20 MG/ML) 5 ML SYRINGE
INTRAMUSCULAR | Status: DC | PRN
  Administered 2024-03-26: 100 mg via INTRAVENOUS

## 2024-03-26 MED ORDER — AMISULPRIDE (ANTIEMETIC) 5 MG/2ML IV SOLN: 10.0000 mg | Freq: Once | INTRAVENOUS | Status: DC | PRN

## 2024-03-26 MED ORDER — PHENYLEPHRINE HCL-NACL 20-0.9 MG/250ML-% IV SOLN
INTRAVENOUS | Status: DC | PRN
  Administered 2024-03-26: 30 ug/min via INTRAVENOUS

## 2024-03-26 MED ORDER — EPHEDRINE SULFATE-NACL 50-0.9 MG/10ML-% IV SOSY
PREFILLED_SYRINGE | INTRAVENOUS | Status: DC | PRN
  Administered 2024-03-26 (×2): 5 mg via INTRAVENOUS
  Administered 2024-03-26: 10 mg via INTRAVENOUS

## 2024-03-26 SURGICAL SUPPLY — 36 items
ADAPTER BRONCHOSCOPE OLYMPUS (ADAPTER) ×2 IMPLANT
ADAPTER VALVE BIOPSY EBUS (MISCELLANEOUS) IMPLANT
BAG COUNTER SPONGE SURGICOUNT (BAG) ×2 IMPLANT
BRUSH CYTOL CELLEBRITY 1.5X140 (MISCELLANEOUS) ×2 IMPLANT
BRUSH SUPERTRAX BIOPSY (INSTRUMENTS) IMPLANT
BRUSH SUPERTRAX NDL-TIP CYTO (INSTRUMENTS) ×2 IMPLANT
CANISTER SUCTION 3000ML PPV (SUCTIONS) ×2 IMPLANT
CNTNR URN SCR LID CUP LEK RST (MISCELLANEOUS) ×2 IMPLANT
COVER BACK TABLE 60X90IN (DRAPES) ×2 IMPLANT
FILTER STRAW FLUID ASPIR (MISCELLANEOUS) IMPLANT
FORCEPS BIOP 1.5 SINGLE USE (MISCELLANEOUS) ×2 IMPLANT
FORCEPS BIOP SUPERTRX PREMAR (INSTRUMENTS) ×2 IMPLANT
GAUZE SPONGE 4X4 12PLY STRL (GAUZE/BANDAGES/DRESSINGS) ×2 IMPLANT
GLOVE BIO SURGEON STRL SZ7.5 (GLOVE) ×4 IMPLANT
GOWN STRL REUS W/ TWL LRG LVL3 (GOWN DISPOSABLE) ×4 IMPLANT
KIT CLEAN ENDO COMPLIANCE (KITS) ×2 IMPLANT
KIT LOCATABLE GUIDE (CANNULA) IMPLANT
KIT MARKER FIDUCIAL DELIVERY (KITS) IMPLANT
KIT TURNOVER KIT B (KITS) ×2 IMPLANT
MARKER SKIN DUAL TIP RULER LAB (MISCELLANEOUS) ×2 IMPLANT
NDL SUPERTRX PREMARK BIOPSY (NEEDLE) ×2 IMPLANT
NEEDLE SUPERTRX PREMARK BIOPSY (NEEDLE) ×2 IMPLANT
NS IRRIG 1000ML POUR BTL (IV SOLUTION) ×2 IMPLANT
OIL SILICONE PENTAX (PARTS (SERVICE/REPAIRS)) ×2 IMPLANT
PAD ARMBOARD POSITIONER FOAM (MISCELLANEOUS) ×4 IMPLANT
PATCHES PATIENT (LABEL) ×6 IMPLANT
SYR 20ML ECCENTRIC (SYRINGE) ×2 IMPLANT
SYR 20ML LL LF (SYRINGE) ×2 IMPLANT
SYR 50ML SLIP (SYRINGE) ×2 IMPLANT
TOWEL GREEN STERILE FF (TOWEL DISPOSABLE) ×2 IMPLANT
TRAP SPECIMEN MUCUS 40CC (MISCELLANEOUS) IMPLANT
TUBE CONNECTING 20X1/4 (TUBING) ×2 IMPLANT
UNDERPAD 30X36 HEAVY ABSORB (UNDERPADS AND DIAPERS) ×2 IMPLANT
VALVE BIOPSY SINGLE USE (MISCELLANEOUS) ×2 IMPLANT
VALVE SUCTION BRONCHIO DISP (MISCELLANEOUS) ×2 IMPLANT
WATER STERILE IRR 1000ML POUR (IV SOLUTION) ×2 IMPLANT

## 2024-03-26 NOTE — Transfer of Care (Cosign Needed)
 Immediate Anesthesia Transfer of Care Note  Patient: Keilen Kahl  Procedure(s) Performed: VIDEO BRONCHOSCOPY WITH ENDOBRONCHIAL NAVIGATION (Left) ENDOBRONCHIAL ULTRASOUND (EBUS) (Left) BRONCHOSCOPY, WITH BRUSH BIOPSY BRONCHOSCOPY, WITH NEEDLE ASPIRATION BIOPSY  Patient Location: PACU  Anesthesia Type:General  Level of Consciousness: drowsy and patient cooperative  Airway & Oxygen Therapy: Patient Spontanous Breathing and Patient connected to face mask oxygen  Post-op Assessment: Report given to RN and Post -op Vital signs reviewed and stable  Post vital signs: Reviewed and stable  Last Vitals:  Vitals Value Taken Time  BP 103/60 03/26/24 12:41  Temp 36.7 C 03/26/24 12:30  Pulse 72 03/26/24 12:45  Resp 15 03/26/24 12:45  SpO2 96 % 03/26/24 12:45  Vitals shown include unfiled device data.  Last Pain:  Vitals:   03/26/24 1230  TempSrc:   PainSc: Asleep      Patients Stated Pain Goal: 2 (03/26/24 0944)  Complications: No notable events documented.

## 2024-03-26 NOTE — Interval H&P Note (Signed)
 History and Physical Interval Note:  03/26/2024 9:43 AM  Stephen Leon  has presented today for surgery, with the diagnosis of LUNG NODULES.  The various methods of treatment have been discussed with the patient and family. After consideration of risks, benefits and other options for treatment, the patient has consented to  Procedure(s): VIDEO BRONCHOSCOPY WITH ENDOBRONCHIAL NAVIGATION (Left) ENDOBRONCHIAL ULTRASOUND (EBUS) (Left) as a surgical intervention.  The patient's history has been reviewed, patient examined, no change in status, stable for surgery.  I have reviewed the patient's chart and labs.  Questions were answered to the patient's satisfaction.     Lamar GORMAN Chris

## 2024-03-26 NOTE — Anesthesia Procedure Notes (Cosign Needed)
 Procedure Name: Intubation Date/Time: 03/26/2024 11:08 AM  Performed by: Peggye Delon Brunswick, MDPre-anesthesia Checklist: Patient identified, Emergency Drugs available, Suction available and Patient being monitored Patient Re-evaluated:Patient Re-evaluated prior to induction Oxygen Delivery Method: Circle system utilized Preoxygenation: Pre-oxygenation with 100% oxygen Induction Type: IV induction Ventilation: Mask ventilation without difficulty Laryngoscope Size: Mac and 4 Grade View: Grade I Tube type: Oral Tube size: 8.5 mm Number of attempts: 1 Airway Equipment and Method: Stylet and Oral airway Placement Confirmation: ETT inserted through vocal cords under direct vision, positive ETCO2 and breath sounds checked- equal and bilateral Secured at: 23 cm Tube secured with: Tape Dental Injury: Teeth and Oropharynx as per pre-operative assessment

## 2024-03-26 NOTE — Op Note (Signed)
 Video Bronchoscopy with Endobronchial Ultrasound and Electromagnetic Navigation Procedure Note  Date of Operation: 03/26/2024  Pre-op Diagnosis: Left upper lobe nodule, left hilar adenopathy  Post-op Diagnosis: Same  Surgeon: LAMAR CHRIS  Assistants: None  Anesthesia: General endotracheal anesthesia  Operation: Flexible video fiberoptic bronchoscopy with endobronchial ultrasound, robotic assisted navigation and biopsies.  Estimated Blood Loss: Minimal  Complications: None apparent  Indications and History: Stephen Leon is a 78 y.o. 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  Found to have an enlarging left upper lobe nodule that was hypermetabolic on PET scan.  Left hilar nodes were also identified.  Recommendation was made to achieve a tissue diagnosis using endobronchial ultrasound and robotic assisted navigational bronchoscopy. The risks, benefits, complications, treatment options and expected outcomes were discussed with the patient.  The possibilities of pneumothorax, pneumonia, reaction to medication, pulmonary aspiration, perforation of a viscus, bleeding, failure to diagnose a condition and creating a complication requiring transfusion or operation were discussed with the patient who freely signed the consent.    Description of Procedure: The patient was seen in the Preoperative Area, was examined and was deemed appropriate to proceed.  The patient was taken to [facility] room 3, identified as Stephen Leon and the procedure verified as Flexible Video Fiberoptic Bronchoscopy with robotic assisted navigation and endobronchial ultrasound.  A Time Out was held and the above information confirmed.   Robotic assisted navigation: Prior to the date of the procedure a high-resolution CT scan of the chest was performed. Utilizing ION software program a virtual tracheobronchial tree was generated to allow the creation of  distinct navigation pathways to the patient's parenchymal abnormalities. After being taken to the operating room general anesthesia was initiated and the patient  was orally intubated. The video fiberoptic bronchoscope was introduced via the endotracheal tube and a general inspection was performed which showed normal left lung anatomy.  The right upper lobe was surgically absent with an intact stump.  The right lower lobe and right middle lobe airways appeared normal. Aspiration of the bilateral mainstems was completed to remove any remaining secretions. Robotic catheter inserted into patient's endotracheal tube.   Target #1 left upper lobe nodule: The distinct navigation pathways prepared prior to this procedure were then utilized to navigate to patient's lesion identified on CT scan. The robotic catheter was secured into place and the vision probe was withdrawn.  Lesion location was approximated using fluoroscopy.  Local registration and targeting was performed using [employer] Cios mobile C-arm three-dimensional imaging. Under fluoroscopic guidance brushings, transbronchial needle biopsies, and transbronchial cryoprobe biopsies were performed to be sent for cytology and pathology.  Needle-in-lesion was confirmed using Cios mobile C-arm.     Endobronchial ultrasound: The robotic scope was then withdrawn and the endobronchial ultrasound was used to identify and characterize the peritracheal, hilar and bronchial lymph nodes. Inspection showed a 0.5 cm node at station 4L and an enlarged node at station 12L. Using real-time ultrasound guidance Wang needle biopsies were take from Station 4L and 12L nodes and were sent for cytology.   At the end of the procedure a general airway inspection was performed and there was no evidence of active bleeding. The bronchoscope was removed.  The patient tolerated the procedure well. There was no significant blood loss and there were no obvious complications. A  post-procedural chest x-ray is pending.  Samples Target #1: 1. Transbronchial brushings from left upper lobe nodule 2. Transbronchial Wang needle  biopsies from left upper lobe nodule 3. Transbronchial cryoprobe biopsies from left upper lobe nodule  EBUS Samples: 1. Wang needle biopsies from 4L node 2. Wang needle biopsies from 12L node  Lamar Chris, MD, PhD 03/26/2024, 12:21 PM [facility]

## 2024-03-26 NOTE — Anesthesia Postprocedure Evaluation (Signed)
 Anesthesia Post Note  Patient: Stephen Leon  Procedure(s) Performed: VIDEO BRONCHOSCOPY WITH ENDOBRONCHIAL NAVIGATION (Left) ENDOBRONCHIAL ULTRASOUND (EBUS) (Left) BRONCHOSCOPY, WITH BRUSH BIOPSY BRONCHOSCOPY, WITH NEEDLE ASPIRATION BIOPSY     Patient location during evaluation: PACU Anesthesia Type: General Level of consciousness: awake Pain management: pain level controlled Vital Signs Assessment: post-procedure vital signs reviewed and stable Respiratory status: spontaneous breathing, nonlabored ventilation and respiratory function stable Cardiovascular status: blood pressure returned to baseline and stable Postop Assessment: no apparent nausea or vomiting Anesthetic complications: no   No notable events documented.  Last Vitals:  Vitals:   03/26/24 1300 03/26/24 1315  BP: (!) 101/59 103/62  Pulse: 67 68  Resp: 16 19  Temp:  36.7 C  SpO2: 92% 91%    Last Pain:  Vitals:   03/26/24 1230  TempSrc:   PainSc: Asleep                 [name]

## 2024-03-26 NOTE — Op Note (Signed)
 Procedure Note  Patient: Stephen Leon  [employer] Cios mobile C-arm was utilized to identify and biopsy left upper lobe nodule.  Needle-in-lesion was confirmed using real-time Cios imaging, and images were uploaded to PACS.   Lamar Chris, MD, PhD 03/26/2024, 12:30 PM [facility] [phone] or if no answer before 7:00PM call [phone] For any issues after 7:00PM please call eLink [phone]

## 2024-03-26 NOTE — Discharge Instructions (Addendum)
 Flexible Bronchoscopy, Care After This sheet gives you information about how to care for yourself after your test. Your doctor may also give you more specific instructions. If you have problems or questions, contact your doctor. Follow these instructions at home: Eating and drinking When you are wide awake, your numbness is gone and your cough and gag reflexes have come back, you may: Start eating only soft foods. Slowly drink liquids. Six hours after the test, go back to your normal diet. Driving Do not drive for 24 hours if you were given a medicine to help you relax (sedative). Do not drive or use heavy machinery while taking prescription pain medicine. General instructions Take over-the-counter and prescription medicines only as told by your doctor. Return to your normal activities as told. Ask what activities are safe for you. Do not use any products that have nicotine  or tobacco in them. This includes cigarettes and e-cigarettes. If you need help quitting, ask your doctor. Keep all follow-up visits as told by your doctor. This is important. It is very important if you had a tissue sample (biopsy) taken. Get help right away if: You have shortness of breath that gets worse. You get light-headed. You feel like you are going to pass out (faint). You have chest pain. You cough up: More than a little blood. More blood than before. Summary Do not use cigarettes. Do not use e-cigarettes. Seek care in the Emergency Department right away if you have chest pain or shortness of breath. Call or MyChart Message our office for any questions or problems at [phone].  Okay to restart aspirin  on 03/27/2024.   This information is not intended to replace advice given to you by your health care provider. Make sure you discuss any questions you have with your health care provider.

## 2024-03-27 ENCOUNTER — Encounter (HOSPITAL_COMMUNITY): Payer: Self-pay | Admitting: Emergency Medicine

## 2024-04-02 ENCOUNTER — Ambulatory Visit: Admitting: Acute Care

## 2024-04-03 ENCOUNTER — Encounter: Payer: Self-pay | Admitting: Acute Care

## 2024-04-03 ENCOUNTER — Ambulatory Visit: Admitting: Acute Care

## 2024-04-03 ENCOUNTER — Telehealth: Payer: Self-pay | Admitting: Acute Care

## 2024-04-03 VITALS — BP 143/83 | HR 65 | Temp 97.8°F | Ht 67.0 in | Wt 134.8 lb

## 2024-04-03 LAB — CYTOLOGY - NON PAP

## 2024-04-03 NOTE — Telephone Encounter (Signed)
 I have called [name], and reviewed the results of his biopsies of the LLL and the 4L and 12L lymph nodes.The cytology had not been resulted this morning when patient had an OV. We had called pathology twice yesterday and one today to get results from the 03/26/2024 biopsies.  See below. 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  I have placed referrals to both medical oncology, radiation oncology, and I have ordered the MRI Brain to complete staging. Pt. And his wife verbalized understanding of the results. I hated to give him this news over the phone.  Of note his previous cancer in 2013 was adenocarcinoma.   A. LUNG, LUL, BRUSHING:  - Squamous cell carcinoma   B. LUNG, LUL, FINE NEEDLE ASPIRATION:  - Squamous cell carcinoma, see comment     C. LYMPH NODE, 4L, FINE NEEDLE ASPIRATION:  - Atypical cells present   D. LYMPH NODE, 12L, FINE NEEDLE ASPIRATION:  - Malignant cells present, consistent with metastatic carcinoma

## 2024-04-03 NOTE — Progress Notes (Unsigned)
 History of Present Illness Stephen Leon is a 78 y.o. male male current smoker with COPD, Chronic bronchitis, and abnormal lung cancer screening. He will be followed by Dr. Shelah for bronchoscopy with biopsies.  Pt. Is followed by Dr. Isadora in the [facility] office for his COPD. He wanted his biopsies done in [place].   Synopsis: Pt has a history of lung cancer and previously underwent robotic surgery to remove the right upper lobe. 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 He has experienced significant weight loss of 15 pounds over the last couple of months. The nodules were PET avid, and patient underwent a bronchoscopy with biopsies 03/26/2024.He is here to follow up    04/03/2024 Discussed the use of AI scribe software for clinical note transcription with the patient, who gave verbal consent to proceed.  History of Present Illness      Test Results: LDCT 02/16/2024 Lung-RADS 4B, suspicious. Additional imaging evaluation or consultation with Pulmonology or Thoracic Surgery recommended. Irregular solid anterior left upper lobe 11.7 mm pulmonary nodule is significantly increased from 4.0 mm on 01/16/2023 CT. Suggest PET-CT for further characterization at this time. 2. Two-vessel coronary atherosclerosis. 3. 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 Recommend attention on follow-up chest CT angiogram in 1 year. 4. 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 No pleural effusions. 5. Diffuse osteopenia. Chronic moderate T7 vertebral compression fracture status post vertebroplasty. 6. Aortic Atherosclerosis (ICD10-I70.0) and Emphysema (ICD10-J43.9).   PET Scan 03/06/2024 Spiculated hypermetabolic left upper lobe mass consistent with neoplasm. Three  hypermetabolic lymph nodes identified, 2 along the left side of the mediastinum and 1 along the left hilum worrisome for regional spread of disease. No additional areas of abnormal uptake elsewhere. Calcified pleural plaques.  Emphysematous lung changes. Diffuse vascular calcifications with dilatation of the common iliac arteries and some aneurysmal dilatation of the inferior abdominal aorta of up to 3.2 cm. In addition there is extensive plaque along the iliac vessels with potential areas of significant stenosis. Please correlate with any symptoms and if needed follow-up CT angiogram could be performed as clinically appropriate. Recommend follow-up ultrasound every 3 years for the abdominal aortic aneurysm. Mucosal thickening of the right maxillary sinus with an air-fluid level. Please correlate for any clinical evidence of sinusitis.   CTA 03/10/2024 Stable to possibly slightly increased in size left upper lobe subpleural 1.4 x 1.2 cm (from 1.2 x 1 cm). 2. Grossly stable mediastinal and left hilar lymphadenopathy. 3. 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 4.  Emphysema (ICD10-J43.9).      Latest Ref Rng & Units 03/26/2024    9:40 AM 03/10/2024   11:03 AM 05/11/2022    6:35 AM  CBC  WBC 4.0 - 10.5 K/uL 5.1  5.2  9.3   Hemoglobin 13.0 - 17.0 g/dL 85.9  84.2  87.0   Hematocrit 39.0 - 52.0 % 41.7  45.8  39.4   Platelets 150 - 400 K/uL 164  135  C 212     C Corrected result       Latest Ref Rng & Units 03/26/2024    9:40 AM 03/10/2024   11:03 AM 05/12/2022    5:37 AM  BMP  Glucose 70 - 99 mg/dL 94  91  98   BUN 8 - 23 mg/dL 13  14  17   Creatinine 0.61 - 1.24 mg/dL 9.26  9.28  9.33   Sodium 135 - 145 mmol/L 137  137  139   Potassium 3.5 - 5.1 mmol/L 4.4  4.2  3.9   Chloride 98 - 111 mmol/L 105  99  104   CO2 22 - 32 mmol/L 21  23  27    Calcium  8.9 - 10.3 mg/dL 8.4  9.2  8.5     BNP    Component Value Date/Time   BNP 141.1 (H) 05/09/2023 0946     ProBNP No results found for: PROBNP  PFT    Component Value Date/Time   FEV1PRE 1.72 03/18/2024 1225   FEV1POST 1.69 03/18/2024 1225   FVCPRE 3.60 03/18/2024 1225   FVCPOST 3.75 03/18/2024 1225   TLC 6.76 03/18/2024 1225   DLCOUNC 14.48 03/18/2024 1225   PREFEV1FVCRT 48 03/18/2024 1225   PSTFEV1FVCRT 45 03/18/2024 1225    DG Chest Port 1 View Result Date: 03/26/2024 CLINICAL DATA:  Status post bronchoscopy with biopsy of left lung. EXAM: PORTABLE CHEST 1 VIEW COMPARISON:  March 10, 2024. FINDINGS: The heart size and mediastinal contours are within normal limits. Calcified pleural plaques are noted bilaterally. No consolidative process is noted. Status post kyphoplasty of midthoracic vertebral body. IMPRESSION: Calcified pleural plaques are noted bilaterally. No acute abnormality seen. Electronically Signed   By: Lynwood Landy Raddle M.D.   On: 03/26/2024 13:13   DG C-ARM BRONCHOSCOPY Result Date: 03/26/2024 C-ARM BRONCHOSCOPY: Fluoroscopy was utilized by the requesting physician.  No radiographic interpretation.   US  Abdomen Limited RUQ (LIVER/GB) Result Date: 03/10/2024 EXAM: Right Upper Quadrant Abdominal Ultrasound 03/10/2024 02:08:25 PM TECHNIQUE: Real-time ultrasonography of the right upper quadrant of the abdomen was performed. COMPARISON: None available. CLINICAL HISTORY: Elevated LFTs FINDINGS: LIVER: The liver demonstrates normal echogenicity. No intrahepatic biliary ductal dilatation. No evidence of mass. BILIARY SYSTEM: There is a 4 mm echogenic polyp along the posterior wall near the fundus of the gallbladder. No pericholecystic fluid or wall thickening. No cholelithiasis. Negative sonographic Murphy's sign. Common bile duct is within normal limits measuring . RIGHT KIDNEY: The right kidney is grossly unremarkable in appearances without evidence of hydronephrosis, echogenic calculi or worrisome mass lesions. PANCREAS: Visualized portions of the pancreas are unremarkable. OTHER:  No right upper quadrant ascites. IMPRESSION: 1. 4 mm echogenic polyp along the posterior wall near the fundus of the gallbladder. Electronically signed by: evalene coho 03/10/2024 02:15 PM EDT RP Workstation: [id number]   CT Angio Chest PE W and/or Wo Contrast Result Date: 03/10/2024 CLINICAL DATA:  Pulmonary embolism (PE) suspected, high prob Pt presents with complaints of chest AND abdominal pain X 1 week. Also endorses SOB, loss of appetite, NANDV, Chills. Hx of lung cancer. Did PET scan on las Wednesday and results showed mass in left upper lobe EXAM: CT ANGIOGRAPHY CHEST WITH CONTRAST TECHNIQUE: Multidetector CT imaging of the chest was performed using the standard protocol during bolus administration of intravenous contrast. Multiplanar CT image reconstructions and MIPs were obtained to evaluate the vascular anatomy. RADIATION DOSE REDUCTION: This exam was performed according to the departmental dose-optimization program which includes automated exposure control, adjustment of the mA and/or kV according to patient size and/or use of iterative reconstruction technique. CONTRAST:  75mL OMNIPAQUE  IOHEXOL  350 MG/ML SOLN COMPARISON:  Chest x-ray 03/10/2024, PET CT 03/06/2024, x-ray lumbar spine 10/10/2023, CT chest 02/16/2024 FINDINGS: Cardiovascular: Satisfactory opacification of the pulmonary arteries to the segmental level. No evidence of pulmonary embolism. Prominent heart size.  No significant pericardial effusion. The ascending thoracic aorta measures up to 4.2 cm-stable. The descending thoracic aorta is normal in caliber. Severe atherosclerotic plaque of the thoracic aorta. No coronary artery calcifications. Mediastinum/Nodes: Grossly stable pre-vascular lymphadenopathy and left hilar lymphadenopathy measuring up to 1.4 cm in short axis in the mediastinum and 1.1 cm in short axis lung the left hilum. No right hilar or axillary lymph nodes. Thyroid  gland, trachea, and esophagus demonstrate no significant  findings. Lungs/Pleura: Moderate centrilobular emphysematous changes. Bibasilar atelectasis. No focal consolidation. Stable to possibly slightly increased in size left upper lobe subpleural 1.4 x 1.2 cm (from 1.2 x 1 cm). No new pulmonary nodule. No pulmonary mass. Redemonstration of bilateral calcified pleural plaques. No pleural effusion. No pneumothorax. Upper Abdomen: Severe atherosclerotic plaque. Stable aneurysmal suprarenal abdominal aorta measuring up to 3.2 cm. Partially visualized infrarenal abdominal aorta aneurysm measuring up to 3.1 cm. No acute abnormality. Musculoskeletal: No chest wall abnormality. No suspicious lytic or blastic osseous lesions. No acute displaced fracture. Chronic T7 fracture status post kyphoplasty. Chronic L2 and L3 compression fractures with worsening of L3 vertebral body height loss. Multilevel degenerative changes of the spine. Review of the MIP images confirms the above findings. IMPRESSION: 1. Stable to possibly slightly increased in size left upper lobe subpleural 1.4 x 1.2 cm (from 1.2 x 1 cm). 2. Grossly stable mediastinal and left hilar lymphadenopathy. 3. 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 4.  Emphysema (ICD10-J43.9). 5. 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 Recommend annual imaging followup by CTA or MRA. This recommendation follows 2010 ACCF/AHA/AATS/ACR/ASA/SCA/SCAI/SIR/STS/SVM Guidelines for the Diagnosis and Management of Patients with Thoracic Aortic Disease. Circulation. 2010; 121: [id number]. Aortic aneurysm NOS (ICD10-I71.9). 6. 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 Recommend follow-up ultrasound every 3 years. (Ref.: J Vasc Surg. 2018; 67:2-77 and J Am Coll Radiol 2013;10(10):789-794.) 7. Aortic Atherosclerosis (ICD10-I70.0). 8. Chronic T7 fracture status post kyphoplasty. Chronic L2 and L3 compression fractures with worsening of  L3 vertebral body height loss. Electronically Signed   By: Morgane  Naveau M.D.   On: 03/10/2024 13:12   DG Chest 2 View Result Date: 03/10/2024 EXAM: 2 VIEW(S) XRAY OF THE CHEST 03/10/2024 11:33:00 AM COMPARISON: 04/20/2023 CLINICAL HISTORY: Chest pain. Pt presents with complaints of chest \\T \ abdominal pain X 1 week. Also endorses SOB, loss of appetite, N\T\V, Chills. Hx of lung cancer. Did PET scan on las Wednesday and results showed mass in left upper lobe. FINDINGS: LUNGS AND PLEURA: Coarsened interstitial markings compatible with COPD/emphysema. Bilateral calcified pleural plaques. HEART AND MEDIASTINUM: No acute abnormality of the cardiac and mediastinal silhouettes. Aortic atherosclerosis. BONES AND SOFT TISSUES: Barosomet-augmentation of T7 compression fracture. IMPRESSION: 1. No acute findings. 2. Coarsened interstitial markings compatible with COPD/emphysema. 3. Bilateral calcified pleural plaques. Electronically signed by: Waddell Calk MD 03/10/2024 11:56 AM EDT RP Workstation: [id number]   NM PET Image Initial (PI) Skull Base To Thigh Result Date: 03/06/2024 CLINICAL DATA:  Initial treatment strategy for lung nodule. EXAM: NUCLEAR MEDICINE PET SKULL BASE TO THIGH TECHNIQUE: 6.6 mCi F-18 FDG was injected intravenously. Full-ring PET imaging was performed from the skull base to thigh after the radiotracer. CT data was obtained and used for attenuation correction and anatomic localization. Fasting blood glucose: 106 mg/dl COMPARISON:  CT 92/88/7974 and older FINDINGS: Mediastinal blood pool activity: SUV max 1.8 Liver activity: SUV max 2.1 NECK: No specific abnormal uptake seen in the neck including along lymph node change of  the submandibular, posterior triangle or internal jugular region. Near symmetric uptake of the visualized intracranial compartment. Incidental CT findings: The parotid glands, submandibular glands and thyroid  glands preserved. Prominent calcifications along the carotid  vasculature. Streak artifact related to the patient's dental hardware. Paranasal sinuses are with some mucosal thickening and fluid in the right maxillary sinus. Please correlate for any symptoms of sinusitis. Mastoid air cells are clear. CHEST: On the prior chest CT there is anterior left upper lobe spiculated mass abutting the pleura measuring close to 12 mm. This lesion is again seen on image 67 of series 4 and has uptake with maximum SUV of 5.3 worrisome for neoplasm. No additional areas of abnormal lung uptake identified. There are 2 left-sided mediastinal hypermetabolic nodes towards the AP window region. The slightly larger and posterior focus has maximum SUV 7.2 and on CT image 72 of series 4 this abnormal node measures 20 by 16 mm. Lesion just anterior to this on image 73 measures 20 by 8 mm and has maximum SUV of 6.7. Is also a hypermetabolic anterior left hilar lymph node with maximum SUV of 5.7 on image 76. No specific abnormal uptake above blood pool elsewhere including along the right side of the mediastinum, right hilum or axillary regions. Incidental CT findings: Please correlate with prior standard CT. There are calcified pleural plaques bilaterally. No pleural effusion. Mild left basilar atelectasis with some ground-glass. Emphysematous lung changes identified. Volume loss along middle lobe. Scattered vascular calcifications. Heart is nonenlarged. Trace pericardial fluid or thickening. Coronary artery calcifications are seen. ABDOMEN/PELVIS: No abnormal hypermetabolic activity within the liver, pancreas, adrenal glands, or spleen. No hypermetabolic lymph nodes in the abdomen or pelvis. Incidental CT findings: On this limited CT for attenuation correction without contrast, the liver, spleen, adrenal glands are unremarkable. There is some atrophy of the pancreas. Gallbladder is present. Slightly dense renal pyramids bilaterally, nonspecific. No definite ureteral stone. Slight trabeculation of the  wall of the urinary bladder with a prominent prostate. Large bowel has a normal course and caliber with scattered stool. Normal appendix. Left-sided colonic diverticula. Stomach and small bowel are nondilated. Diffuse vascular calcifications identified along the aorta and branch vessels. Mild dilatation of the abdominal aorta measuring up to 3.0 x 3.1 cm. There is ectasia of the common iliac arteries, on the right of 2.2 cm and left 1.7 cm. Again significant calcified plaque with potential areas of stenosis seen particularly along the common iliac arteries and proximal external internal iliac arteries. Please correlate with any symptoms and additional evaluation as clinically appropriate such as CT angiogram. SKELETON: No specific abnormal uptake identified along the visualized osseous structures. Incidental CT findings: Scattered degenerative changes. There is augmentation cement within a compressed vertebral body at T7. There is also severe compression of the L3 level with sclerosis but no abnormal uptake. Please correlate with history. IMPRESSION: Spiculated hypermetabolic left upper lobe mass consistent with neoplasm. Three hypermetabolic lymph nodes identified, 2 along the left side of the mediastinum and 1 along the left hilum worrisome for regional spread of disease. No additional areas of abnormal uptake elsewhere. Calcified pleural plaques.  Emphysematous lung changes. Diffuse vascular calcifications with dilatation of the common iliac arteries and some aneurysmal dilatation of the inferior abdominal aorta of up to 3.2 cm. In addition there is extensive plaque along the iliac vessels with potential areas of significant stenosis. Please correlate with any symptoms and if needed follow-up CT angiogram could be performed as clinically appropriate. Recommend follow-up ultrasound every 3 years  for the abdominal aortic aneurysm. (Ref.: J Vasc Surg. 2018; 67:2-77 and J Am Coll Radiol 2013;10(10):789-794.) Mucosal  thickening of the right maxillary sinus with an air-fluid level. Please correlate for any clinical evidence of sinusitis. Colonic diverticula Electronically Signed   By: Ranell Bring M.D.   On: 03/06/2024 13:34     Past medical hx Past Medical History:  Diagnosis Date   Anxiety    Bronchitis    Cancer (HCC) 2016   lung   COPD (chronic obstructive pulmonary disease) (HCC)    Coronary artery disease 09/26/2023   Cough 07/13/2007   Qualifier: Diagnosis of  By: Georgian ROSALEA CHARM Lamar    Dyspnea    due to copd - w/exertion   GERD (gastroesophageal reflux disease)    Hypertension    pt denies this and is not on bp meds but vascular note states htn   LUNG NODULE 07/13/2007   Qualifier: Diagnosis of  By: Georgian ROSALEA CHARM Lamar    Myocardial infarction [facility]) 2023   mild per patient   Nodule of left lung 03/2024   upper lobe   Pneumonia 2013   Thoracic aortic aneurysm (HCC)    followed by vascular     Social History   Tobacco Use   Smoking status: Every Day    Current packs/day: 0.50    Average packs/day: 0.5 packs/day for 60.0 years (30.0 ttl pk-yrs)    Types: Cigarettes   Smokeless tobacco: Never   Tobacco comments:    10cigarettes daily- AJP 03/04/24  Vaping Use   Vaping status: Never Used  Substance Use Topics   Alcohol  use: Yes    Alcohol /week: 12.0 - 16.0 standard drinks of alcohol     Types: 12 - 16 Standard drinks or equivalent per week    Comment: Drinks 3-4 beers every night   Drug use: No    Mr.Wellman Alvia reports that he has been smoking cigarettes. He has a 30 pack-year smoking history. He has never used smokeless tobacco. He reports current alcohol  use of about 12.0 - 16.0 standard drinks of alcohol  per week. He reports that he does not use drugs.  Tobacco Cessation: Ready to quit: Not Answered Counseling given: Not Answered Tobacco comments: 10cigarettes daily- AJP 03/04/24   Past surgical hx, Family hx, Social hx all reviewed.  Current Outpatient Medications  on File Prior to Visit  Medication Sig   albuterol  (VENTOLIN  HFA) 108 (90 Base) MCG/ACT inhaler Inhale 2 puffs into the lungs every 6 (six) hours as needed.   aspirin  EC 81 MG tablet Take 81 mg by mouth daily. Swallow whole.   budesonide-glycopyrrolate -formoterol (BREZTRI  AEROSPHERE) 160-9-4.8 MCG/ACT AERO inhaler Inhale 2 puffs into the lungs in the morning and at bedtime.   budesonide-glycopyrrolate -formoterol (BREZTRI  AEROSPHERE) 160-9-4.8 MCG/ACT AERO inhaler Inhale 2 puffs into the lungs in the morning and at bedtime.   budesonide-glycopyrrolate -formoterol (BREZTRI  AEROSPHERE) 160-9-4.8 MCG/ACT AERO inhaler Inhale 2 puffs into the lungs in the morning and at bedtime.   chlorpheniramine-HYDROcodone  (TUSSIONEX) 10-8 MG/5ML Take 5 mLs by mouth at bedtime as needed.   gabapentin  (NEURONTIN ) 800 MG tablet Take 800 mg by mouth 2 (two) times daily.    HYDROcodone -acetaminophen  (NORCO/VICODIN) 5-325 MG tablet Take 1 tablet by mouth every 12 (twelve) hours as needed for moderate pain (pain score 4-6).   ibuprofen  (ADVIL ) 800 MG tablet Take 800 mg by mouth every 8 (eight) hours as needed for mild pain.   ipratropium-albuterol  (DUONEB) 0.5-2.5 (3) MG/3ML SOLN Inhale 3 mLs into the lungs  every 4 (four) hours as needed (When he get sick and the doctor tells him to take it).   omeprazole (PRILOSEC) 40 MG capsule Take 40 mg by mouth daily.   tamsulosin (FLOMAX) 0.4 MG CAPS capsule Take 0.4 mg by mouth at bedtime.   No current facility-administered medications on file prior to visit.     No Known Allergies  Review Of Systems:  Constitutional:   No  weight loss, night sweats,  Fevers, chills, fatigue, or  lassitude.  HEENT:   No headaches,  Difficulty swallowing,  Tooth/dental problems, or  Sore throat,                No sneezing, itching, ear ache, nasal congestion, post nasal drip,   CV:  No chest pain,  Orthopnea, PND, swelling in lower extremities, anasarca, dizziness, palpitations, syncope.    GI  No heartburn, indigestion, abdominal pain, nausea, vomiting, diarrhea, change in bowel habits, loss of appetite, bloody stools.   Resp: No shortness of breath with exertion or at rest.  No excess mucus, no productive cough,  No non-productive cough,  No coughing up of blood.  No change in color of mucus.  No wheezing.  No chest wall deformity  Skin: no rash or lesions.  GU: no dysuria, change in color of urine, no urgency or frequency.  No flank pain, no hematuria   MS:  No joint pain or swelling.  No decreased range of motion.  No back pain.  Psych:  No change in mood or affect. No depression or anxiety.  No memory loss.   Vital Signs There were no vitals taken for this visit.   Physical Exam:  General- No distress,  A&Ox3 ENT: No sinus tenderness, TM clear, pale nasal mucosa, no oral exudate,no post nasal drip, no LAN Cardiac: S1, S2, regular rate and rhythm, no murmur Chest: No wheeze/ rales/ dullness; no accessory muscle use, no nasal flaring, no sternal retractions Abd.: Soft Non-tender Ext: No clubbing cyanosis, edema Neuro:  normal strength Skin: No rashes, warm and dry Psych: normal mood and behavior   Assessment/Plan  Assessment and Plan Assessment & Plan        Lauraine JULIANNA Lites, NP 04/03/2024  9:18 AM

## 2024-04-04 NOTE — Progress Notes (Signed)
 Thoracic Location of Tumor / Histology: Left Upper Lobe Lung   Patient presented for lung cancer screening scan.  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 I have placed referrals to both medical oncology, radiation oncology, and I have ordered the MRI Brain to complete staging.    Past/Anticipated interventions by cardiothoracic surgery, if any:   -Right Upper Lobectomy 2013  Past/Anticipated interventions by medical oncology, if any:  Dr. Sherrod 04/10/2024  Tobacco/Marijuana/Snuff/ETOH use: Current Smoker  Signs/Symptoms Weight changes, if any: He has lost about 15 pounds in the last 2-3 months. Respiratory complaints, if any: He reports some SOB noted, thinks it is due to anxiety with all of his scans and appointments. Hemoptysis, if any: He reports some productive cough with thick white sputum.  Had some hemoptysis after his bronchoscopy. Pain issues, if any:    SAFETY ISSUES: Prior radiation? No Pacemaker/ICD? No  Possible current pregnancy? N/a Is the patient on methotrexate? No  Current Complaints / other details:

## 2024-04-05 ENCOUNTER — Encounter: Payer: Self-pay | Admitting: Radiation Oncology

## 2024-04-05 ENCOUNTER — Ambulatory Visit
Admission: RE | Admit: 2024-04-05 | Discharge: 2024-04-05 | Disposition: A | Discharge status: Home / Self Care | Source: Ambulatory Visit | Attending: Radiation Oncology | Admitting: Radiation Oncology

## 2024-04-05 VITALS — BP 162/88 | HR 61 | Temp 97.3°F | Resp 18 | Ht 67.0 in | Wt 134.2 lb

## 2024-04-05 DIAGNOSIS — I7143 Infrarenal abdominal aortic aneurysm, without rupture: Secondary | ICD-10-CM | POA: Diagnosis not present

## 2024-04-05 DIAGNOSIS — Z8679 Personal history of other diseases of the circulatory system: Secondary | ICD-10-CM | POA: Diagnosis not present

## 2024-04-05 DIAGNOSIS — R590 Localized enlarged lymph nodes: Secondary | ICD-10-CM | POA: Insufficient documentation

## 2024-04-05 DIAGNOSIS — F17210 Nicotine dependence, cigarettes, uncomplicated: Secondary | ICD-10-CM | POA: Diagnosis not present

## 2024-04-05 DIAGNOSIS — I714 Abdominal aortic aneurysm, without rupture, unspecified: Secondary | ICD-10-CM | POA: Diagnosis not present

## 2024-04-05 DIAGNOSIS — I252 Old myocardial infarction: Secondary | ICD-10-CM | POA: Insufficient documentation

## 2024-04-05 DIAGNOSIS — J449 Chronic obstructive pulmonary disease, unspecified: Secondary | ICD-10-CM | POA: Diagnosis not present

## 2024-04-05 DIAGNOSIS — J9811 Atelectasis: Secondary | ICD-10-CM | POA: Insufficient documentation

## 2024-04-05 DIAGNOSIS — K219 Gastro-esophageal reflux disease without esophagitis: Secondary | ICD-10-CM | POA: Insufficient documentation

## 2024-04-05 DIAGNOSIS — I2510 Atherosclerotic heart disease of native coronary artery without angina pectoris: Secondary | ICD-10-CM | POA: Diagnosis not present

## 2024-04-05 DIAGNOSIS — C3412 Malignant neoplasm of upper lobe, left bronchus or lung: Secondary | ICD-10-CM

## 2024-04-05 DIAGNOSIS — Z8701 Personal history of pneumonia (recurrent): Secondary | ICD-10-CM | POA: Diagnosis not present

## 2024-04-05 DIAGNOSIS — R0789 Other chest pain: Secondary | ICD-10-CM | POA: Diagnosis not present

## 2024-04-05 DIAGNOSIS — Z7982 Long term (current) use of aspirin: Secondary | ICD-10-CM | POA: Insufficient documentation

## 2024-04-05 DIAGNOSIS — I10 Essential (primary) hypertension: Secondary | ICD-10-CM | POA: Diagnosis not present

## 2024-04-05 DIAGNOSIS — I7121 Aneurysm of the ascending aorta, without rupture: Secondary | ICD-10-CM | POA: Insufficient documentation

## 2024-04-05 DIAGNOSIS — M8448XA Pathological fracture, other site, initial encounter for fracture: Secondary | ICD-10-CM | POA: Diagnosis not present

## 2024-04-05 DIAGNOSIS — Z79899 Other long term (current) drug therapy: Secondary | ICD-10-CM | POA: Insufficient documentation

## 2024-04-05 DIAGNOSIS — M479 Spondylosis, unspecified: Secondary | ICD-10-CM | POA: Insufficient documentation

## 2024-04-05 DIAGNOSIS — Z902 Acquired absence of lung [part of]: Secondary | ICD-10-CM | POA: Diagnosis not present

## 2024-04-05 DIAGNOSIS — I700 Atherosclerosis of aorta: Secondary | ICD-10-CM | POA: Insufficient documentation

## 2024-04-05 NOTE — Telephone Encounter (Signed)
 Thank you :)

## 2024-04-05 NOTE — Progress Notes (Signed)
 Radiation Oncology         [phone] ________________________________  Name: Stephen Leon        MRN: [id number]  Date of Service: 04/05/2024 DOB: 10-09-1945  RR:Yjwiz, Tamra, MD  [name], MD     REFERRING PHYSICIAN: [name], MD   DIAGNOSIS: The encounter diagnosis was Malignant neoplasm of upper lobe of left lung (HCC).  Stage IIIA (cT1b, N2, MX) squamous cell carcinoma, NSCLC of the LUL  HISTORY OF PRESENT ILLNESS: Stephen Leon is a 78 y.o. male with a past medical history significant for current tobacco use, COPD, chronic bronchitis, and history of lung cancer; s/p RUL lobectomy in 2013 at [facility] seen at the request of Dr. Shelah for newly diagnosed non-small cell carcinoma.   Patient underwent CT of the chest for lung cancer screening on 02/16/2024.  Imaging demonstrated a 1.17 centimeter pulmonary nodule in the left upper lobe, which had slightly increased from 01/16/2023 when it measured 0.4 cm.  Subsequently the patient underwent pet imaging on 03/06/2024 which demonstrated the enlarging left upper lobe mass to be hypermetabolic, consistent with neoplasm; 3 hypermetabolic lymph nodes were also identified, 2 lung left side of the mediastinum and 1 along the left hilum, worrisome for metastatic disease; no other areas were concerning for metastatic disease.   On 03/10/2024, the patient presented to the ED with complaints of intermittent chest pain.  CT angio of the chest demonstrated no evidence of PE, the left upper lobe nodule was noted to be possibly increased in size measuring 1.4 cm.  The patient was discharged home with instructions to follow-up with PCP.  Patient underwent biopsy on 03/26/2024 under the care of Dr. Shelah.  Pathology from the left upper lobe demonstrates squamous cell carcinoma.  Pathology from the 4L lymph node showed atypical cells and from the 12L lymph node malignant cells were present, consistent with metastatic  carcinoma.  Given these findings, he is currently referred to medical and radiation oncology.  MRI of the brain is scheduled for 04/09/2024.  He is scheduled to meet with Dr. Gatha on 04/10/2024.    PREVIOUS RADIATION THERAPY: No   PAST MEDICAL HISTORY:  Past Medical History:  Diagnosis Date   Anxiety    Bronchitis    Cancer (HCC) 2016   lung   COPD (chronic obstructive pulmonary disease) (HCC)    Coronary artery disease 09/26/2023   Cough 07/13/2007   Qualifier: Diagnosis of  By: Georgian ROSALEA CHARM Lamar    Dyspnea    due to copd - w/exertion   GERD (gastroesophageal reflux disease)    Hypertension    pt denies this and is not on bp meds but vascular note states htn   LUNG NODULE 07/13/2007   Qualifier: Diagnosis of  By: Georgian ROSALEA CHARM Lamar    Myocardial infarction [facility]) 2023   mild per patient   Nodule of left lung 03/2024   upper lobe   Pneumonia 2013   Thoracic aortic aneurysm [facility])    followed by vascular       PAST SURGICAL HISTORY: Past Surgical History:  Procedure Laterality Date   BIOPSY  07/13/2023   Procedure: BIOPSY;  Surgeon: Onita Elspeth Sharper, DO;  Location: [facility] ENDOSCOPY;  Service: Gastroenterology;;   BRONCHIAL BRUSHINGS  03/26/2024   Procedure: BRONCHOSCOPY, WITH BRUSH BIOPSY;  Surgeon: [name], MD;  Location: [facility] ENDOSCOPY;  Service: Pulmonary;;   BRONCHIAL NEEDLE ASPIRATION BIOPSY  03/26/2024   Procedure: BRONCHOSCOPY, WITH  NEEDLE ASPIRATION BIOPSY;  Surgeon: [name], MD;  Location: [facility] ENDOSCOPY;  Service: Pulmonary;;   COLONOSCOPY N/A 10/27/2022   hx polyps; Procedure: COLONOSCOPY;  Surgeon: Onita Elspeth Sharper, DO;  Location: [facility] ENDOSCOPY;  Service: Gastroenterology;  Laterality: N/A;   COLONOSCOPY WITH PROPOFOL  N/A 07/13/2023   Procedure: COLONOSCOPY WITH PROPOFOL ;  Surgeon: Onita Elspeth Sharper, DO;  Location: [facility] ENDOSCOPY;  Service: Gastroenterology;  Laterality: N/A;   CORONARY PRESSURE/FFR STUDY N/A 05/19/2020   Procedure:  INTRAVASCULAR PRESSURE WIRE/FFR STUDY;  Surgeon: Ammon Blunt, MD;  Location: [facility] INVASIVE CV LAB;  Service: Cardiovascular;  Laterality: N/A;   DIAGNOSTIC LAPAROSCOPY     ENDOBRONCHIAL ULTRASOUND Left 03/26/2024   Procedure: ENDOBRONCHIAL ULTRASOUND (EBUS);  Surgeon: [name], MD;  Location: [facility] ENDOSCOPY;  Service: Pulmonary;  Laterality: Left;   ESOPHAGOGASTRODUODENOSCOPY N/A 10/27/2022   Procedure: ESOPHAGOGASTRODUODENOSCOPY (EGD);  Surgeon: Onita Elspeth Sharper, DO;  Location: [facility] ENDOSCOPY;  Service: Gastroenterology;  Laterality: N/A;   EYE SURGERY Bilateral    HERNIA REPAIR     KYPHOPLASTY N/A 12/13/2018   Procedure: KYPHOPLASTY- T7;  Surgeon: Kathlynn Sharper, MD;  Location: [facility] ORS;  Service: Orthopedics;  Laterality: N/A;   LEFT HEART CATH N/A 05/11/2022   Procedure: Left Heart Cath;  Surgeon: Ammon Blunt, MD;  Location: [facility] INVASIVE CV LAB;  Service: Cardiovascular;  Laterality: N/A;   LEFT HEART CATH AND CORONARY ANGIOGRAPHY Left 05/19/2020   Procedure: LEFT HEART CATH AND CORONARY ANGIOGRAPHY;  Surgeon: Ammon Blunt, MD;  Location: [facility] INVASIVE CV LAB;  Service: Cardiovascular;  Laterality: Left;   Robotic assisted Right upper lobectomy Right 05/17/2012   [facility]   VIDEO BRONCHOSCOPY WITH ENDOBRONCHIAL NAVIGATION Left 03/26/2024   Procedure: VIDEO BRONCHOSCOPY WITH ENDOBRONCHIAL NAVIGATION;  Surgeon: [name], MD;  Location: [facility] ENDOSCOPY;  Service: Pulmonary;  Laterality: Left;   XI ROBOTIC ASSISTED INGUINAL HERNIA REPAIR WITH MESH Right 07/19/2019   Procedure: XI ROBOTIC ASSISTED INGUINAL HERNIA REPAIR WITH MESH;  Surgeon: Tye Millet, DO;  Location: [facility] ORS;  Service: General;  Laterality: Right;     FAMILY HISTORY:  Family History  Problem Relation Age of Onset   COPD Mother      SOCIAL HISTORY:  reports that he has been smoking cigarettes. He has a 30 pack-year smoking history. He has never used  smokeless tobacco. He reports current alcohol  use of about 12.0 - 16.0 standard drinks of alcohol  per week. He reports that he does not use drugs.   ALLERGIES: Patient has no known allergies.   MEDICATIONS:  Current Outpatient Medications  Medication Sig Dispense Refill   albuterol  (VENTOLIN  HFA) 108 (90 Base) MCG/ACT inhaler Inhale 2 puffs into the lungs every 6 (six) hours as needed.     aspirin  EC 81 MG tablet Take 81 mg by mouth daily. Swallow whole.     budesonide-glycopyrrolate -formoterol (BREZTRI  AEROSPHERE) 160-9-4.8 MCG/ACT AERO inhaler Inhale 2 puffs into the lungs in the morning and at bedtime. 11.8 g 0   budesonide-glycopyrrolate -formoterol (BREZTRI  AEROSPHERE) 160-9-4.8 MCG/ACT AERO inhaler Inhale 2 puffs into the lungs in the morning and at bedtime. 3 each 3   budesonide-glycopyrrolate -formoterol (BREZTRI  AEROSPHERE) 160-9-4.8 MCG/ACT AERO inhaler Inhale 2 puffs into the lungs in the morning and at bedtime.     chlorpheniramine-HYDROcodone  (TUSSIONEX) 10-8 MG/5ML Take 5 mLs by mouth at bedtime as needed. 115 mL 0   gabapentin  (NEURONTIN ) 800 MG tablet Take 800 mg by mouth 2 (two) times daily.      HYDROcodone -acetaminophen  (NORCO/VICODIN) 5-325  MG tablet Take 1 tablet by mouth every 12 (twelve) hours as needed for moderate pain (pain score 4-6). 30 tablet 0   ibuprofen  (ADVIL ) 800 MG tablet Take 800 mg by mouth every 8 (eight) hours as needed for mild pain.     ipratropium-albuterol  (DUONEB) 0.5-2.5 (3) MG/3ML SOLN Inhale 3 mLs into the lungs every 4 (four) hours as needed (When he get sick and the doctor tells him to take it).     omeprazole (PRILOSEC) 40 MG capsule Take 40 mg by mouth daily.     tamsulosin (FLOMAX) 0.4 MG CAPS capsule Take 0.4 mg by mouth at bedtime.     No current facility-administered medications for this encounter.     REVIEW OF SYSTEMS: On review of systems, the patient reports that he is doing well overall. He notes some shortness of breath with anxiety  from his diagnosis, and a productive cough consisting of white phlegm.      PHYSICAL EXAM:  Wt Readings from Last 3 Encounters:  04/05/24 134 lb 3.2 oz (60.9 kg)  04/03/24 134 lb 12.8 oz (61.1 kg)  03/26/24 135 lb (61.2 kg)   Temp Readings from Last 3 Encounters:  04/05/24 (!) 97.3 F (36.3 C)  04/03/24 97.8 F (36.6 C) (Temporal)  03/26/24 98.1 F (36.7 C)   BP Readings from Last 3 Encounters:  04/05/24 (!) 162/88  04/03/24 (!) 143/83  03/26/24 103/62   Pulse Readings from Last 3 Encounters:  04/05/24 61  04/03/24 65  03/26/24 68   Pain Assessment Pain Score: 5  Pain Loc: Abdomen/10  In general this is a well appearing male in no acute distress. He's alert and oriented x4 and appropriate throughout the examination. Cardiopulmonary assessment is negative for acute distress and he exhibits normal effort.     ECOG = 1  0 - Asymptomatic (Fully active, able to carry on all predisease activities without restriction)  1 - Symptomatic but completely ambulatory (Restricted in physically strenuous activity but ambulatory and able to carry out work of a light or sedentary nature. For example, light housework, office work)  2 - Symptomatic, <50% in bed during the day (Ambulatory and capable of all self care but unable to carry out any work activities. Up and about more than 50% of waking hours)  3 - Symptomatic, >50% in bed, but not bedbound (Capable of only limited self-care, confined to bed or chair 50% or more of waking hours)  4 - Bedbound (Completely disabled. Cannot carry on any self-care. Totally confined to bed or chair)  5 - Death   Raylene MM, Creech RH, Tormey DC, et al. [phone]). Toxicity and response criteria of the [facility]. Am. DOROTHA Bridges. Oncol. 5 (6): 649-55    LABORATORY DATA:  Lab Results  Component Value Date   WBC 5.1 03/26/2024   HGB 14.0 03/26/2024   HCT 41.7 03/26/2024   MCV 98.8 03/26/2024   PLT 164 03/26/2024   Lab  Results  Component Value Date   NA 137 03/26/2024   K 4.4 03/26/2024   CL 105 03/26/2024   CO2 21 (L) 03/26/2024   Lab Results  Component Value Date   ALT 32 03/26/2024   AST 28 03/26/2024   ALKPHOS 191 (H) 03/26/2024   BILITOT 0.9 03/26/2024      RADIOGRAPHY: DG Chest Port 1 View Result Date: 03/26/2024 CLINICAL DATA:  Status post bronchoscopy with biopsy of left lung. EXAM: PORTABLE CHEST 1 VIEW COMPARISON:  March 10, 2024.  FINDINGS: The heart size and mediastinal contours are within normal limits. Calcified pleural plaques are noted bilaterally. No consolidative process is noted. Status post kyphoplasty of midthoracic vertebral body. IMPRESSION: Calcified pleural plaques are noted bilaterally. No acute abnormality seen. Electronically Signed   By: Lynwood Landy Raddle M.D.   On: 03/26/2024 13:13   DG C-ARM BRONCHOSCOPY Result Date: 03/26/2024 C-ARM BRONCHOSCOPY: Fluoroscopy was utilized by the requesting physician.  No radiographic interpretation.   US  Abdomen Limited RUQ (LIVER/GB) Result Date: 03/10/2024 EXAM: Right Upper Quadrant Abdominal Ultrasound 03/10/2024 02:08:25 PM TECHNIQUE: Real-time ultrasonography of the right upper quadrant of the abdomen was performed. COMPARISON: None available. CLINICAL HISTORY: Elevated LFTs FINDINGS: LIVER: The liver demonstrates normal echogenicity. No intrahepatic biliary ductal dilatation. No evidence of mass. BILIARY SYSTEM: There is a 4 mm echogenic polyp along the posterior wall near the fundus of the gallbladder. No pericholecystic fluid or wall thickening. No cholelithiasis. Negative sonographic Murphy's sign. Common bile duct is within normal limits measuring . RIGHT KIDNEY: The right kidney is grossly unremarkable in appearances without evidence of hydronephrosis, echogenic calculi or worrisome mass lesions. PANCREAS: Visualized portions of the pancreas are unremarkable. OTHER: No right upper quadrant ascites. IMPRESSION: 1. 4 mm echogenic polyp  along the posterior wall near the fundus of the gallbladder. Electronically signed by: evalene coho 03/10/2024 02:15 PM EDT RP Workstation: [id number]   CT Angio Chest PE W and/or Wo Contrast Result Date: 03/10/2024 CLINICAL DATA:  Pulmonary embolism (PE) suspected, high prob Pt presents with complaints of chest AND abdominal pain X 1 week. Also endorses SOB, loss of appetite, NANDV, Chills. Hx of lung cancer. Did PET scan on las Wednesday and results showed mass in left upper lobe EXAM: CT ANGIOGRAPHY CHEST WITH CONTRAST TECHNIQUE: Multidetector CT imaging of the chest was performed using the standard protocol during bolus administration of intravenous contrast. Multiplanar CT image reconstructions and MIPs were obtained to evaluate the vascular anatomy. RADIATION DOSE REDUCTION: This exam was performed according to the departmental dose-optimization program which includes automated exposure control, adjustment of the mA and/or kV according to patient size and/or use of iterative reconstruction technique. CONTRAST:  75mL OMNIPAQUE  IOHEXOL  350 MG/ML SOLN COMPARISON:  Chest x-ray 03/10/2024, PET CT 03/06/2024, x-ray lumbar spine 10/10/2023, CT chest 02/16/2024 FINDINGS: Cardiovascular: Satisfactory opacification of the pulmonary arteries to the segmental level. No evidence of pulmonary embolism. Prominent heart size. No significant pericardial effusion. The ascending thoracic aorta measures up to 4.2 cm-stable. The descending thoracic aorta is normal in caliber. Severe atherosclerotic plaque of the thoracic aorta. No coronary artery calcifications. Mediastinum/Nodes: Grossly stable pre-vascular lymphadenopathy and left hilar lymphadenopathy measuring up to 1.4 cm in short axis in the mediastinum and 1.1 cm in short axis lung the left hilum. No right hilar or axillary lymph nodes. Thyroid  gland, trachea, and esophagus demonstrate no significant findings. Lungs/Pleura: Moderate centrilobular emphysematous  changes. Bibasilar atelectasis. No focal consolidation. Stable to possibly slightly increased in size left upper lobe subpleural 1.4 x 1.2 cm (from 1.2 x 1 cm). No new pulmonary nodule. No pulmonary mass. Redemonstration of bilateral calcified pleural plaques. No pleural effusion. No pneumothorax. Upper Abdomen: Severe atherosclerotic plaque. Stable aneurysmal suprarenal abdominal aorta measuring up to 3.2 cm. Partially visualized infrarenal abdominal aorta aneurysm measuring up to 3.1 cm. No acute abnormality. Musculoskeletal: No chest wall abnormality. No suspicious lytic or blastic osseous lesions. No acute displaced fracture. Chronic T7 fracture status post kyphoplasty. Chronic L2 and L3 compression fractures with worsening of L3 vertebral body height  loss. Multilevel degenerative changes of the spine. Review of the MIP images confirms the above findings. IMPRESSION: 1. Stable to possibly slightly increased in size left upper lobe subpleural 1.4 x 1.2 cm (from 1.2 x 1 cm). 2. Grossly stable mediastinal and left hilar lymphadenopathy. 3. 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 4.  Emphysema (ICD10-J43.9). 5. 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 Recommend annual imaging followup by CTA or MRA. This recommendation follows 2010 ACCF/AHA/AATS/ACR/ASA/SCA/SCAI/SIR/STS/SVM Guidelines for the Diagnosis and Management of Patients with Thoracic Aortic Disease. Circulation. 2010; 121: Z733-z630. Aortic aneurysm NOS (ICD10-I71.9). 6. 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 Recommend follow-up ultrasound every 3 years. (Ref.: J Vasc Surg. 2018; 67:2-77 and J Am Coll Radiol 2013;10(10):789-794.) 7. Aortic Atherosclerosis (ICD10-I70.0). 8. Chronic T7 fracture status post kyphoplasty. Chronic L2 and L3 compression fractures with worsening of L3 vertebral body height loss. Electronically Signed   By:  Morgane  Naveau M.D.   On: 03/10/2024 13:12   DG Chest 2 View Result Date: 03/10/2024 EXAM: 2 VIEW(S) XRAY OF THE CHEST 03/10/2024 11:33:00 AM COMPARISON: 04/20/2023 CLINICAL HISTORY: Chest pain. Pt presents with complaints of chest \\T \ abdominal pain X 1 week. Also endorses SOB, loss of appetite, N\T\V, Chills. Hx of lung cancer. Did PET scan on las Wednesday and results showed mass in left upper lobe. FINDINGS: LUNGS AND PLEURA: Coarsened interstitial markings compatible with COPD/emphysema. Bilateral calcified pleural plaques. HEART AND MEDIASTINUM: No acute abnormality of the cardiac and mediastinal silhouettes. Aortic atherosclerosis. BONES AND SOFT TISSUES: Barosomet-augmentation of T7 compression fracture. IMPRESSION: 1. No acute findings. 2. Coarsened interstitial markings compatible with COPD/emphysema. 3. Bilateral calcified pleural plaques. Electronically signed by: Waddell Calk MD 03/10/2024 11:56 AM EDT RP Workstation: [id number]       IMPRESSION/PLAN: 1. Stage IIIA (cT1b, N2, MX) squamous cell carcinoma, NSCLC of the LUL  We personally reviewed this patient's current workup.  He presents today with squamous cell carcinoma of the left upper lobe and confirmed lymph node metastasis.  Dr. Dewey recommends external beam radiation given concurrently with chemotherapy, if deemed a candidate for chemo therapy.  He is meeting with Dr. Gatha on 04/10/2024 to discuss systemic treatment.  Brain MRI has been scheduled for 04/09/2024 to complete staging workup.  Today, I talked to the patient and family about the findings and work-up thus far.  We discussed the natural history of non-small cell lung cancer and general treatment, highlighting the role of radiotherapy in the management.  We discussed the available radiation techniques, and focused on the details of logistics and delivery.  We reviewed the anticipated acute and late sequelae associated with radiation in this setting. The patient was  encouraged to ask questions that I answered to the best of my ability. A patient consent form was discussed and signed.  We retained a copy for our records. The patient would like to proceed with radiation and will be scheduled for CT simulation.  Dr. Dewey anticipates 33 fractions radiation directed to the left upper lobe nodule and hypermetabolic lymph nodes given concurrently with chemotherapy. We anticipate a start date of 04/22/2024.   2. Tobacco Abuse  Patient continues to smoke approximately 10 cigarettes per day. Smoking cessation instruction/counseling given:  counseled patient on the dangers of tobacco use, advised patient to stop smoking, and reviewed strategies to maximize success. Patient states he would like to quit smoking after his treatment has finished.     In a visit lasting  60 minutes, greater than 50% of the time was spent face to face discussing the patient's condition, in preparation for the discussion, and coordinating the patient's care. Greater than 3 minutes were spent discussing smoking cessation.   The above documentation reflects my direct findings during this shared patient visit. Please see the separate note by Dr. Dewey on this date for the remainder of the patient's plan of care.    Leeroy Due, PA-C    **Disclaimer: This note was dictated with voice recognition software. Similar sounding words can inadvertently be transcribed and this note may contain transcription errors which may not have been corrected upon publication of note.**

## 2024-04-09 ENCOUNTER — Ambulatory Visit (HOSPITAL_COMMUNITY)
Admission: RE | Admit: 2024-04-09 | Discharge: 2024-04-09 | Disposition: A | Discharge status: Home / Self Care | Source: Ambulatory Visit | Attending: Acute Care | Admitting: Acute Care

## 2024-04-09 ENCOUNTER — Other Ambulatory Visit: Payer: Self-pay | Admitting: Medical Oncology

## 2024-04-09 DIAGNOSIS — R9082 White matter disease, unspecified: Secondary | ICD-10-CM | POA: Diagnosis not present

## 2024-04-09 DIAGNOSIS — G319 Degenerative disease of nervous system, unspecified: Secondary | ICD-10-CM | POA: Diagnosis not present

## 2024-04-09 DIAGNOSIS — C3490 Malignant neoplasm of unspecified part of unspecified bronchus or lung: Secondary | ICD-10-CM | POA: Insufficient documentation

## 2024-04-09 DIAGNOSIS — C3491 Malignant neoplasm of unspecified part of right bronchus or lung: Secondary | ICD-10-CM

## 2024-04-09 MED ORDER — GADOBUTROL 1 MMOL/ML IV SOLN
6.0000 mL | Freq: Once | INTRAVENOUS | Status: AC | PRN
  Administered 2024-04-09: 6 mL via INTRAVENOUS

## 2024-04-09 NOTE — Progress Notes (Signed)
 Request for pt's tissue be sent to [facility] for molecular studies and PD-L1 emailed to [employer] and Tonya Pendell, [facility] path technicians.  [phone] block D or [id number] block B requested be sent.

## 2024-04-10 ENCOUNTER — Inpatient Hospital Stay

## 2024-04-10 ENCOUNTER — Inpatient Hospital Stay: Admitting: Internal Medicine

## 2024-04-10 VITALS — BP 132/82 | HR 70 | Temp 98.0°F | Resp 17 | Ht 67.0 in | Wt 134.0 lb

## 2024-04-10 DIAGNOSIS — R590 Localized enlarged lymph nodes: Secondary | ICD-10-CM | POA: Insufficient documentation

## 2024-04-10 DIAGNOSIS — K219 Gastro-esophageal reflux disease without esophagitis: Secondary | ICD-10-CM | POA: Insufficient documentation

## 2024-04-10 DIAGNOSIS — Z79899 Other long term (current) drug therapy: Secondary | ICD-10-CM | POA: Insufficient documentation

## 2024-04-10 DIAGNOSIS — R0609 Other forms of dyspnea: Secondary | ICD-10-CM | POA: Insufficient documentation

## 2024-04-10 DIAGNOSIS — R0600 Dyspnea, unspecified: Secondary | ICD-10-CM | POA: Diagnosis not present

## 2024-04-10 DIAGNOSIS — I2510 Atherosclerotic heart disease of native coronary artery without angina pectoris: Secondary | ICD-10-CM | POA: Diagnosis not present

## 2024-04-10 DIAGNOSIS — Z902 Acquired absence of lung [part of]: Secondary | ICD-10-CM | POA: Insufficient documentation

## 2024-04-10 DIAGNOSIS — Z825 Family history of asthma and other chronic lower respiratory diseases: Secondary | ICD-10-CM | POA: Insufficient documentation

## 2024-04-10 DIAGNOSIS — Z8701 Personal history of pneumonia (recurrent): Secondary | ICD-10-CM | POA: Insufficient documentation

## 2024-04-10 DIAGNOSIS — Z8679 Personal history of other diseases of the circulatory system: Secondary | ICD-10-CM | POA: Insufficient documentation

## 2024-04-10 DIAGNOSIS — F419 Anxiety disorder, unspecified: Secondary | ICD-10-CM

## 2024-04-10 DIAGNOSIS — R1310 Dysphagia, unspecified: Secondary | ICD-10-CM | POA: Insufficient documentation

## 2024-04-10 DIAGNOSIS — R630 Anorexia: Secondary | ICD-10-CM | POA: Insufficient documentation

## 2024-04-10 DIAGNOSIS — Z7289 Other problems related to lifestyle: Secondary | ICD-10-CM

## 2024-04-10 DIAGNOSIS — J449 Chronic obstructive pulmonary disease, unspecified: Secondary | ICD-10-CM | POA: Diagnosis not present

## 2024-04-10 DIAGNOSIS — Z510 Encounter for antineoplastic radiation therapy: Secondary | ICD-10-CM | POA: Insufficient documentation

## 2024-04-10 DIAGNOSIS — R110 Nausea: Secondary | ICD-10-CM | POA: Insufficient documentation

## 2024-04-10 DIAGNOSIS — I252 Old myocardial infarction: Secondary | ICD-10-CM | POA: Insufficient documentation

## 2024-04-10 DIAGNOSIS — K2100 Gastro-esophageal reflux disease with esophagitis, without bleeding: Secondary | ICD-10-CM | POA: Insufficient documentation

## 2024-04-10 DIAGNOSIS — Z923 Personal history of irradiation: Secondary | ICD-10-CM | POA: Insufficient documentation

## 2024-04-10 DIAGNOSIS — Z79633 Long term (current) use of mitotic inhibitor: Secondary | ICD-10-CM | POA: Insufficient documentation

## 2024-04-10 DIAGNOSIS — Z7982 Long term (current) use of aspirin: Secondary | ICD-10-CM | POA: Insufficient documentation

## 2024-04-10 DIAGNOSIS — I10 Essential (primary) hypertension: Secondary | ICD-10-CM | POA: Insufficient documentation

## 2024-04-10 DIAGNOSIS — C3412 Malignant neoplasm of upper lobe, left bronchus or lung: Secondary | ICD-10-CM | POA: Insufficient documentation

## 2024-04-10 DIAGNOSIS — Z79630 Long term (current) use of alkylating agent: Secondary | ICD-10-CM | POA: Insufficient documentation

## 2024-04-10 DIAGNOSIS — Z9221 Personal history of antineoplastic chemotherapy: Secondary | ICD-10-CM | POA: Insufficient documentation

## 2024-04-10 DIAGNOSIS — F17210 Nicotine dependence, cigarettes, uncomplicated: Secondary | ICD-10-CM | POA: Insufficient documentation

## 2024-04-10 DIAGNOSIS — Z5111 Encounter for antineoplastic chemotherapy: Secondary | ICD-10-CM | POA: Insufficient documentation

## 2024-04-10 DIAGNOSIS — R634 Abnormal weight loss: Secondary | ICD-10-CM | POA: Insufficient documentation

## 2024-04-10 DIAGNOSIS — Y842 Radiological procedure and radiotherapy as the cause of abnormal reaction of the patient, or of later complication, without mention of misadventure at the time of the procedure: Secondary | ICD-10-CM | POA: Insufficient documentation

## 2024-04-10 DIAGNOSIS — R059 Cough, unspecified: Secondary | ICD-10-CM

## 2024-04-10 DIAGNOSIS — R5383 Other fatigue: Secondary | ICD-10-CM | POA: Insufficient documentation

## 2024-04-10 DIAGNOSIS — C3492 Malignant neoplasm of unspecified part of left bronchus or lung: Secondary | ICD-10-CM

## 2024-04-10 DIAGNOSIS — C3491 Malignant neoplasm of unspecified part of right bronchus or lung: Secondary | ICD-10-CM

## 2024-04-10 LAB — CBC WITH DIFFERENTIAL (CANCER CENTER ONLY)
Abs Immature Granulocytes: 0.03 K/uL (ref 0.00–0.07)
Basophils Absolute: 0.1 K/uL (ref 0.0–0.1)
Basophils Relative: 1 %
Eosinophils Absolute: 0.2 K/uL (ref 0.0–0.5)
Eosinophils Relative: 2 %
HCT: 39.2 % (ref 39.0–52.0)
Hemoglobin: 13.2 g/dL (ref 13.0–17.0)
Immature Granulocytes: 0 %
Lymphocytes Relative: 32 %
Lymphs Abs: 2.4 K/uL (ref 0.7–4.0)
MCH: 33.1 pg (ref 26.0–34.0)
MCHC: 33.7 g/dL (ref 30.0–36.0)
MCV: 98.2 fL (ref 80.0–100.0)
Monocytes Absolute: 1 K/uL (ref 0.1–1.0)
Monocytes Relative: 13 %
Neutro Abs: 3.9 K/uL (ref 1.7–7.7)
Neutrophils Relative %: 52 %
Platelet Count: 137 K/uL — ABNORMAL LOW (ref 150–400)
RBC: 3.99 MIL/uL — ABNORMAL LOW (ref 4.22–5.81)
RDW: 15 % (ref 11.5–15.5)
WBC Count: 7.6 K/uL (ref 4.0–10.5)
nRBC: 0 % (ref 0.0–0.2)

## 2024-04-10 LAB — CMP (CANCER CENTER ONLY)
ALT: 11 U/L (ref 0–44)
AST: 15 U/L (ref 15–41)
Albumin: 3.8 g/dL (ref 3.5–5.0)
Alkaline Phosphatase: 100 U/L (ref 38–126)
Anion gap: 5 (ref 5–15)
BUN: 10 mg/dL (ref 8–23)
CO2: 29 mmol/L (ref 22–32)
Calcium: 8.9 mg/dL (ref 8.9–10.3)
Chloride: 105 mmol/L (ref 98–111)
Creatinine: 0.63 mg/dL (ref 0.61–1.24)
GFR, Estimated: >60 mL/min (ref >60)
Glucose, Bld: 82 mg/dL (ref 70–99)
Potassium: 4.1 mmol/L (ref 3.5–5.1)
Sodium: 139 mmol/L (ref 135–145)
Total Bilirubin: 0.7 mg/dL (ref 0.0–1.2)
Total Protein: 7.3 g/dL (ref 6.5–8.1)

## 2024-04-10 MED ORDER — ONDANSETRON HCL 8 MG PO TABS: 8.0000 mg | ORAL_TABLET | Freq: Three times a day (TID) | ORAL | 1 refills | Status: DC | PRN

## 2024-04-10 MED ORDER — PROCHLORPERAZINE MALEATE 10 MG PO TABS: 10.0000 mg | ORAL_TABLET | Freq: Four times a day (QID) | ORAL | 1 refills | Status: DC | PRN

## 2024-04-10 MED ORDER — LIDOCAINE-PRILOCAINE 2.5-2.5 % EX CREA: TOPICAL_CREAM | CUTANEOUS | 3 refills | Status: DC

## 2024-04-10 MED ORDER — DEXAMETHASONE 4 MG PO TABS: ORAL_TABLET | ORAL | 1 refills | Status: DC

## 2024-04-10 NOTE — Progress Notes (Signed)
 [facility] Telephone:([phone]) [phone]   Fax:([phone]) [phone]  CONSULT NOTE  REFERRING PHYSICIAN: Dr. Lamar Chris  REASON FOR CONSULTATION:  78 years old white male recently diagnosed with lung cancer  HPI Stephen Leon is a 78 y.o. male with past medical history significant for COPD, coronary artery disease, GERD, hypertension, myocardial infarction, and anxiety as well as history of thoracic aortic aneurysm and previous history of pneumonia.  The patient was enrolled on the CT chest lung cancer screening program and CT of the chest done on February 16, 2024 showed Irregular solid anterior irregular solid anterior left upper lobe 1.17 cm pulmonary nodule significantly increased from 0.4 cm on previous scan in June 2024. This was followed by a PET scan on March 06, 2024 and it showed anterior left upper lobe spiculated nodule abutting the pleura and measuring close to 1.2 cm with SUV max of 5.3 worrisome for neoplasm.  There were 2 left-sided mediastinal hypermetabolic nodes towards the AP window region.  The slightly larger and posterior focus had maximum SUV of 7.2 and it measured 2.0 x 1.6 cm and the lesion anterior to this measured 2.0 x 0.8 cm with SUV max of 6.7.  There was also hypermetabolic anterior left hilar lymph node with SUV max of 5.7.  The patient was seen by Dr. Chris and on March 26, 2024 he underwent redo bronchoscopy with endobronchial ultrasound and electromagnetic navigation procedure with biopsy of the left upper lobe lung nodule and mediastinal and hilar lymphadenopathy.  The final pathology ([id number]) of the fine-needle aspiration as well as a brushing of the left upper lobe nodule was consistent with squamous cell carcinoma.  The fine-needle aspiration of the 4L lymph node showed atypical cells present but fine-needle aspiration of the 12L lymph node showed malignant cells consistent with metastatic carcinoma. The patient was referred to me today for  evaluation for evaluation and discussion of his treatment options.  He had MRI of the brain performed yesterday and that showed no evidence to suggest intracranial metastatic disease.  HPI  Discussed the use of AI scribe software for clinical note transcription with the patient, who gave verbal consent to proceed.  History of Present Illness Stephen Leon is a 78 year old male with lung cancer who presents for consultation regarding chemotherapy. He is accompanied by his wife, Stephen Leon.  He was diagnosed with lung cancer following a workup that revealed a 1.2 cm hypermetabolic nodule in the left upper lobe and two hypermetabolic mediastinal lymph nodes. A bronchoscopy was performed, and the patient was informed that cancer was found in the left upper lobe and mediastinal lymph nodes. He has a history of right upper lobe resection in 2013 for a growing nodule, which was believed to have been fully removed at the time.  He experiences a constant cough, fatigue, and significant weight loss of 15 pounds over the last few months, along with a loss of appetite. No chest pain related to the cancer, hemoptysis, nausea, vomiting, diarrhea, or headaches. He has a history of a T7 compression fracture, which causes chest pain, and a past small heart attack.  He has smoked for 62 years and continues to smoke. He consumes four to five beers in the evening regularly. He denies any drug abuse and has no known medication allergies.  He reports a raspy voice and was informed by a specialist that he has a swollen vocal cord, which is being monitored. An MRI of the brain was negative  for metastasis.     Past Medical History:  Diagnosis Date   Anxiety    Bronchitis    Cancer (HCC) 2016   lung   COPD (chronic obstructive pulmonary disease) (HCC)    Coronary artery disease 09/26/2023   Cough 07/13/2007   Qualifier: Diagnosis of  By: Georgian ROSALEA CHARM Lamar    Dyspnea    due to copd - w/exertion   GERD  (gastroesophageal reflux disease)    Hypertension    pt denies this and is not on bp meds but vascular note states htn   LUNG NODULE 07/13/2007   Qualifier: Diagnosis of  By: Georgian ROSALEA CHARM Lamar    Myocardial infarction [facility]) 2023   mild per patient   Nodule of left lung 03/2024   upper lobe   Pneumonia 2013   Thoracic aortic aneurysm [facility])    followed by vascular      Past Surgical History:  Procedure Laterality Date   BIOPSY  07/13/2023   Procedure: BIOPSY;  Surgeon: Onita Elspeth Sharper, DO;  Location: [facility] ENDOSCOPY;  Service: Gastroenterology;;   BRONCHIAL BRUSHINGS  03/26/2024   Procedure: BRONCHOSCOPY, WITH BRUSH BIOPSY;  Surgeon: [name], MD;  Location: [facility] ENDOSCOPY;  Service: Pulmonary;;   BRONCHIAL NEEDLE ASPIRATION BIOPSY  03/26/2024   Procedure: BRONCHOSCOPY, WITH NEEDLE ASPIRATION BIOPSY;  Surgeon: [name], MD;  Location: [facility] ENDOSCOPY;  Service: Pulmonary;;   COLONOSCOPY N/A 10/27/2022   hx polyps; Procedure: COLONOSCOPY;  Surgeon: Onita Elspeth Sharper, DO;  Location: [facility] ENDOSCOPY;  Service: Gastroenterology;  Laterality: N/A;   COLONOSCOPY WITH PROPOFOL  N/A 07/13/2023   Procedure: COLONOSCOPY WITH PROPOFOL ;  Surgeon: Onita Elspeth Sharper, DO;  Location: [facility] ENDOSCOPY;  Service: Gastroenterology;  Laterality: N/A;   CORONARY PRESSURE/FFR STUDY N/A 05/19/2020   Procedure: INTRAVASCULAR PRESSURE WIRE/FFR STUDY;  Surgeon: Ammon Blunt, MD;  Location: [facility] INVASIVE CV LAB;  Service: Cardiovascular;  Laterality: N/A;   DIAGNOSTIC LAPAROSCOPY     ENDOBRONCHIAL ULTRASOUND Left 03/26/2024   Procedure: ENDOBRONCHIAL ULTRASOUND (EBUS);  Surgeon: [name], MD;  Location: [facility] ENDOSCOPY;  Service: Pulmonary;  Laterality: Left;   ESOPHAGOGASTRODUODENOSCOPY N/A 10/27/2022   Procedure: ESOPHAGOGASTRODUODENOSCOPY (EGD);  Surgeon: Onita Elspeth Sharper, DO;  Location: [facility] ENDOSCOPY;  Service: Gastroenterology;  Laterality: N/A;   EYE SURGERY Bilateral     HERNIA REPAIR     KYPHOPLASTY N/A 12/13/2018   Procedure: KYPHOPLASTY- T7;  Surgeon: Kathlynn Sharper, MD;  Location: [facility] ORS;  Service: Orthopedics;  Laterality: N/A;   LEFT HEART CATH N/A 05/11/2022   Procedure: Left Heart Cath;  Surgeon: Ammon Blunt, MD;  Location: [facility] INVASIVE CV LAB;  Service: Cardiovascular;  Laterality: N/A;   LEFT HEART CATH AND CORONARY ANGIOGRAPHY Left 05/19/2020   Procedure: LEFT HEART CATH AND CORONARY ANGIOGRAPHY;  Surgeon: Ammon Blunt, MD;  Location: [facility] INVASIVE CV LAB;  Service: Cardiovascular;  Laterality: Left;   Robotic assisted Right upper lobectomy Right 05/17/2012   [facility]   VIDEO BRONCHOSCOPY WITH ENDOBRONCHIAL NAVIGATION Left 03/26/2024   Procedure: VIDEO BRONCHOSCOPY WITH ENDOBRONCHIAL NAVIGATION;  Surgeon: [name], MD;  Location: [facility] ENDOSCOPY;  Service: Pulmonary;  Laterality: Left;   XI ROBOTIC ASSISTED INGUINAL HERNIA REPAIR WITH MESH Right 07/19/2019   Procedure: XI ROBOTIC ASSISTED INGUINAL HERNIA REPAIR WITH MESH;  Surgeon: Tye Millet, DO;  Location: [facility] ORS;  Service: General;  Laterality: Right;    Family History  Problem Relation Age of Onset   COPD Mother     Social  History Social History   Tobacco Use   Smoking status: Every Day    Current packs/day: 0.50    Average packs/day: 0.5 packs/day for 60.0 years (30.0 ttl pk-yrs)    Types: Cigarettes   Smokeless tobacco: Never   Tobacco comments:    10cigarettes daily- AJP 03/04/24    Has not stopped yet MMP8/27/25  Vaping Use   Vaping status: Never Used  Substance Use Topics   Alcohol  use: Yes    Alcohol /week: 12.0 - 16.0 standard drinks of alcohol     Types: 12 - 16 Standard drinks or equivalent per week    Comment: Drinks 3-4 beers every night   Drug use: No    No Known Allergies  Current Outpatient Medications  Medication Sig Dispense Refill   albuterol  (VENTOLIN  HFA) 108 (90 Base) MCG/ACT inhaler Inhale 2 puffs into  the lungs every 6 (six) hours as needed.     aspirin  EC 81 MG tablet Take 81 mg by mouth daily. Swallow whole.     budesonide-glycopyrrolate -formoterol (BREZTRI  AEROSPHERE) 160-9-4.8 MCG/ACT AERO inhaler Inhale 2 puffs into the lungs in the morning and at bedtime. 11.8 g 0   budesonide-glycopyrrolate -formoterol (BREZTRI  AEROSPHERE) 160-9-4.8 MCG/ACT AERO inhaler Inhale 2 puffs into the lungs in the morning and at bedtime. 3 each 3   budesonide-glycopyrrolate -formoterol (BREZTRI  AEROSPHERE) 160-9-4.8 MCG/ACT AERO inhaler Inhale 2 puffs into the lungs in the morning and at bedtime.     chlorpheniramine-HYDROcodone  (TUSSIONEX) 10-8 MG/5ML Take 5 mLs by mouth at bedtime as needed. 115 mL 0   gabapentin  (NEURONTIN ) 800 MG tablet Take 800 mg by mouth 2 (two) times daily.      HYDROcodone -acetaminophen  (NORCO/VICODIN) 5-325 MG tablet Take 1 tablet by mouth every 12 (twelve) hours as needed for moderate pain (pain score 4-6). 30 tablet 0   ibuprofen  (ADVIL ) 800 MG tablet Take 800 mg by mouth every 8 (eight) hours as needed for mild pain.     ipratropium-albuterol  (DUONEB) 0.5-2.5 (3) MG/3ML SOLN Inhale 3 mLs into the lungs every 4 (four) hours as needed (When he get sick and the doctor tells him to take it).     omeprazole (PRILOSEC) 40 MG capsule Take 40 mg by mouth daily.     tamsulosin (FLOMAX) 0.4 MG CAPS capsule Take 0.4 mg by mouth at bedtime.     No current facility-administered medications for this visit.    Review of Systems  Constitutional: positive for fatigue Eyes: negative Ears, nose, mouth, throat, and face: negative Respiratory: positive for cough and dyspnea on exertion Cardiovascular: negative Gastrointestinal: negative Genitourinary:negative Integument/breast: negative Hematologic/lymphatic: negative Musculoskeletal:negative Neurological: negative Behavioral/Psych: negative Endocrine: negative Allergic/Immunologic: negative  Physical Exam  MJO:jozmu, healthy, no distress,  well nourished, and well developed SKIN: skin color, texture, turgor are normal, no rashes or significant lesions HEAD: Normocephalic, No masses, lesions, tenderness or abnormalities EYES: normal, PERRLA, Conjunctiva are pink and non-injected EARS: External ears normal, Canals clear OROPHARYNX:no exudate, no erythema, and lips, buccal mucosa, and tongue normal  NECK: supple, no adenopathy, no JVD LYMPH:  no palpable lymphadenopathy, no hepatosplenomegaly LUNGS: clear to auscultation , and palpation HEART: regular rate & rhythm, no murmurs, and no gallops ABDOMEN:abdomen soft, non-tender, normal bowel sounds, and no masses or organomegaly BACK: Back symmetric, no curvature., No CVA tenderness EXTREMITIES:no joint deformities, effusion, or inflammation, no edema  NEURO: alert & oriented x 3 with fluent speech, no focal motor/sensory deficits  PERFORMANCE STATUS: ECOG 1  LABORATORY DATA: Lab Results  Component Value Date  WBC 5.1 03/26/2024   HGB 14.0 03/26/2024   HCT 41.7 03/26/2024   MCV 98.8 03/26/2024   PLT 164 03/26/2024      Chemistry      Component Value Date/Time   NA 137 03/26/2024 0940   K 4.4 03/26/2024 0940   CL 105 03/26/2024 0940   CO2 21 (L) 03/26/2024 0940   BUN 13 03/26/2024 0940   CREATININE 0.73 03/26/2024 0940      Component Value Date/Time   CALCIUM  8.4 (L) 03/26/2024 0940   ALKPHOS 191 (H) 03/26/2024 0940   AST 28 03/26/2024 0940   ALT 32 03/26/2024 0940   BILITOT 0.9 03/26/2024 0940       RADIOGRAPHIC STUDIES: MR BRAIN W WO CONTRAST Result Date: 04/10/2024 MR BRAIN WITHOUT THEN WITH IV CONTRAST COMPARISON: 05/02/2012 CLINICAL HISTORY: Non-small cell lung cancer, staging TECHNIQUE: MRI of the brain with and without 6 mL of gadolinium based IV contrast. 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 This has significantly progressed compared with the prior examination.  Correlation for interval ischemic changes. No suspicious enhancement following contrast administration to suggest metastatic disease. Diffusion weighted images demonstrate no acute ischemic event. There is no midline shift, mass effect, hydrocephalus or hemorrhage. Appropriate flow voids are seen in the intracranial vessels. The visualized paranasal sinuses, orbits and mastoid air cells are clear. IMPRESSION: Cortical atrophy and significant progression of periventricular white matter disease likely reflective of interval change. No evidence to suggest intracranial metastatic disease. Electronically signed by: Norleen Satchel MD 04/10/2024 11:14 AM EDT RP Workstation: [id number]   DG Chest Port 1 View Result Date: 03/26/2024 CLINICAL DATA:  Status post bronchoscopy with biopsy of left lung. EXAM: PORTABLE CHEST 1 VIEW COMPARISON:  March 10, 2024. FINDINGS: The heart size and mediastinal contours are within normal limits. Calcified pleural plaques are noted bilaterally. No consolidative process is noted. Status post kyphoplasty of midthoracic vertebral body. IMPRESSION: Calcified pleural plaques are noted bilaterally. No acute abnormality seen. Electronically Signed   By: Lynwood Landy Raddle M.D.   On: 03/26/2024 13:13   DG C-ARM BRONCHOSCOPY Result Date: 03/26/2024 C-ARM BRONCHOSCOPY: Fluoroscopy was utilized by the requesting physician.  No radiographic interpretation.    ASSESSMENT: This is a very pleasant 78 years old white male diagnosed with stage IIIa (T1b, N2, M0) non-small cell lung cancer, squamous cell carcinoma presented with left upper lobe lung nodule in addition to left hilar and mediastinal lymphadenopathy.  This was diagnosed in August 2025.  The patient also has a history of stage IIb (T3, N0, M0) non-small cell lung cancer, adenocarcinoma status post right upper lobectomy with lymph node dissection at [facility] on May 17, 2012 with a tumor size of 6.5 cm.  He was  also treated with adjuvant chemotherapy at that time.   PLAN: I had a lengthy discussion with the patient and his wife today about his current disease stage, prognosis and treatment options. I personally independently reviewed the imaging studies as well as the pathology report and discussed the results with the patient and his wife. 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 Not amenable to surgical resection due to lymph node involvement and COPD. Treatment plan includes concurrent chemoradiation therapy. - Initiate radiation therapy  for 6.5 weeks starting April 22, 2024. - Administer weekly low-dose chemotherapy concurrent with radiation to minimize side effects and enhance radiation efficacy. Monitor for hair loss, nausea, and blood count drop. - Monitor blood counts, liver, and kidney function weekly. - Plan for immunotherapy with durvalumab after a 3-week break post-chemoradiation, contingent on treatment response. - Order port placement for chemotherapy administration. - Provide nausea medication prescription. - Schedule chemo class teaching prior to treatment initiation.  Nicotine  dependence, current daily smoker Current daily smoker for 62 years. Smoking cessation is strongly advised to improve treatment outcomes and reduce side effects. - Advise smoking cessation.  Alcohol  use, daily Daily alcohol  consumption of four to five beers in the evening. Reducing alcohol  intake is recommended to improve treatment outcomes and minimize side effects. - Advise reduction of alcohol  consumption.  Unintentional weight loss, chronic cough, and fatigue Reports unintentional weight loss of 15 pounds over the last few months, chronic cough, and fatigue. Likely related to  underlying lung cancer and its treatment. - Assess symptoms regularly during treatment. He was advised to call immediately if he has any other concerning symptoms in the interval.  The patient voices understanding of current disease status and treatment options and is in agreement with the current care plan.  All questions were answered. The patient knows to call the clinic with any problems, questions or concerns. We can certainly see the patient much sooner if necessary.  Thank you so much for allowing me to participate in the care of Stephen Leon. I will continue to follow up the patient with you and assist in his care.  The total time spent in the appointment was 90 minutes including review of chart and various tests results, discussions about plan of care and coordination of care plan .   Disclaimer: This note was dictated with voice recognition software. Similar sounding words can inadvertently be transcribed and may not be corrected upon review.   Sherrod MARLA Sherrod April 10, 2024, 12:49 PM

## 2024-04-10 NOTE — Addendum Note (Signed)
 Addended by: CAROLEE LOA DEL on: 04/10/2024 03:30 PM   Modules accepted: Orders

## 2024-04-10 NOTE — Progress Notes (Signed)
 START ON PATHWAY REGIMEN - Non-Small Cell Lung     A cycle is every 7 days, concurrent with RT:     Paclitaxel      Carboplatin   **Always confirm dose/schedule in your pharmacy ordering system**  Patient Characteristics: Preoperative or Nonsurgical Candidate (Clinical Staging), Stage IIB (N2a only) or Stage III - Nonsurgical Candidate, PS = 0,1 Therapeutic Status: Preoperative or Nonsurgical Candidate (Clinical Staging) AJCC T Category: cT1b AJCC N Category: cN2b AJCC M Category: cM0 AJCC 9 Stage Grouping: IIIA Check here if patient was staged using an edition other than AJCC Staging 9th Edition: false ECOG Performance Status: 1 Intent of Therapy: Curative Intent, Discussed with Patient

## 2024-04-11 ENCOUNTER — Other Ambulatory Visit: Payer: Self-pay

## 2024-04-12 ENCOUNTER — Other Ambulatory Visit (HOSPITAL_COMMUNITY)

## 2024-04-14 ENCOUNTER — Encounter: Payer: Self-pay | Admitting: Internal Medicine

## 2024-04-14 NOTE — Progress Notes (Signed)
 The proposed treatment discussed in conference is for discussion purpose only and is not a binding recommendation.  The patients have not been physically examined, or presented with their treatment options.  Therefore, final treatment plans cannot be decided.

## 2024-04-15 ENCOUNTER — Encounter (HOSPITAL_COMMUNITY): Payer: Self-pay | Admitting: Internal Medicine

## 2024-04-15 ENCOUNTER — Ambulatory Visit
Admission: RE | Admit: 2024-04-15 | Discharge: 2024-04-15 | Disposition: A | Discharge status: Home / Self Care | Source: Ambulatory Visit | Attending: Radiation Oncology | Admitting: Radiation Oncology

## 2024-04-15 DIAGNOSIS — I252 Old myocardial infarction: Secondary | ICD-10-CM | POA: Diagnosis not present

## 2024-04-15 DIAGNOSIS — C3412 Malignant neoplasm of upper lobe, left bronchus or lung: Secondary | ICD-10-CM | POA: Diagnosis not present

## 2024-04-15 DIAGNOSIS — I10 Essential (primary) hypertension: Secondary | ICD-10-CM | POA: Diagnosis not present

## 2024-04-15 DIAGNOSIS — I2510 Atherosclerotic heart disease of native coronary artery without angina pectoris: Secondary | ICD-10-CM | POA: Diagnosis not present

## 2024-04-15 DIAGNOSIS — Z79633 Long term (current) use of mitotic inhibitor: Secondary | ICD-10-CM | POA: Diagnosis not present

## 2024-04-15 DIAGNOSIS — J449 Chronic obstructive pulmonary disease, unspecified: Secondary | ICD-10-CM | POA: Diagnosis not present

## 2024-04-15 DIAGNOSIS — Z510 Encounter for antineoplastic radiation therapy: Secondary | ICD-10-CM | POA: Diagnosis not present

## 2024-04-15 DIAGNOSIS — F17210 Nicotine dependence, cigarettes, uncomplicated: Secondary | ICD-10-CM | POA: Diagnosis not present

## 2024-04-15 DIAGNOSIS — Z5111 Encounter for antineoplastic chemotherapy: Secondary | ICD-10-CM | POA: Diagnosis not present

## 2024-04-15 NOTE — Progress Notes (Signed)
 Pharmacist Chemotherapy Monitoring - Initial Assessment    Anticipated start date: 04/22/24   The following has been reviewed per standard work regarding the patient's treatment regimen: The patient's diagnosis, treatment plan and drug doses, and organ/hematologic function Lab orders and baseline tests specific to treatment regimen  The treatment plan start date, drug sequencing, and pre-medications Prior authorization status  Patient's documented medication list, including drug-drug interaction screen and prescriptions for anti-emetics and supportive care specific to the treatment regimen The drug concentrations, fluid compatibility, administration routes, and timing of the medications to be used The patient's access for treatment and lifetime cumulative dose history, if applicable  The patient's medication allergies and previous infusion related reactions, if applicable   Changes made to treatment plan:  N/A  Follow up needed:  N/A   Marlee Eleanor Neighbors, RPH, 04/15/2024  9:52 AM

## 2024-04-16 DIAGNOSIS — C3492 Malignant neoplasm of unspecified part of left bronchus or lung: Secondary | ICD-10-CM | POA: Diagnosis not present

## 2024-04-17 ENCOUNTER — Encounter (HOSPITAL_COMMUNITY): Payer: Self-pay | Admitting: Internal Medicine

## 2024-04-17 NOTE — Progress Notes (Unsigned)
 This NN received word via [place] from chemo nurse educator that the pt had not yet received a port placement. NN reached out to the pt to confirm if the pt wants a port or not. Pt states he is declining a port at this time, but will get one if the nursing staff finds that he is a difficult access. NN replied to chemo nurse educator and notified Dr Sherrod that the pt is declining a port at this time, but will consider getting one later.

## 2024-04-18 ENCOUNTER — Inpatient Hospital Stay

## 2024-04-18 ENCOUNTER — Encounter: Payer: Self-pay | Admitting: Internal Medicine

## 2024-04-19 DIAGNOSIS — Z510 Encounter for antineoplastic radiation therapy: Secondary | ICD-10-CM | POA: Diagnosis not present

## 2024-04-22 ENCOUNTER — Other Ambulatory Visit: Payer: Self-pay

## 2024-04-22 ENCOUNTER — Ambulatory Visit
Admission: RE | Admit: 2024-04-22 | Discharge: 2024-04-22 | Disposition: A | Discharge status: Home / Self Care | Source: Ambulatory Visit | Attending: Radiation Oncology | Admitting: Radiation Oncology

## 2024-04-22 ENCOUNTER — Inpatient Hospital Stay

## 2024-04-22 VITALS — BP 132/67 | HR 63 | Temp 97.6°F | Resp 17 | Wt 134.5 lb

## 2024-04-22 DIAGNOSIS — C3492 Malignant neoplasm of unspecified part of left bronchus or lung: Secondary | ICD-10-CM

## 2024-04-22 DIAGNOSIS — C3412 Malignant neoplasm of upper lobe, left bronchus or lung: Secondary | ICD-10-CM | POA: Diagnosis not present

## 2024-04-22 DIAGNOSIS — Z510 Encounter for antineoplastic radiation therapy: Secondary | ICD-10-CM | POA: Diagnosis not present

## 2024-04-22 DIAGNOSIS — F17210 Nicotine dependence, cigarettes, uncomplicated: Secondary | ICD-10-CM | POA: Diagnosis not present

## 2024-04-22 LAB — RAD ONC ARIA SESSION SUMMARY
Course Elapsed Days: 0
Plan Fractions Treated to Date: 1
Plan Prescribed Dose Per Fraction: 2 Gy
Plan Total Fractions Prescribed: 30
Plan Total Prescribed Dose: 60 Gy
Reference Point Dosage Given to Date: 2 Gy
Reference Point Session Dosage Given: 2 Gy
Session Number: 1

## 2024-04-22 LAB — CMP (CANCER CENTER ONLY)
ALT: 9 U/L (ref 0–44)
AST: 14 U/L — ABNORMAL LOW (ref 15–41)
Albumin: 3.8 g/dL (ref 3.5–5.0)
Alkaline Phosphatase: 82 U/L (ref 38–126)
Anion gap: 6 (ref 5–15)
BUN: 14 mg/dL (ref 8–23)
CO2: 26 mmol/L (ref 22–32)
Calcium: 8.8 mg/dL — ABNORMAL LOW (ref 8.9–10.3)
Chloride: 107 mmol/L (ref 98–111)
Creatinine: 0.76 mg/dL (ref 0.61–1.24)
GFR, Estimated: >60 mL/min (ref >60)
Glucose, Bld: 107 mg/dL — ABNORMAL HIGH (ref 70–99)
Potassium: 3.9 mmol/L (ref 3.5–5.1)
Sodium: 139 mmol/L (ref 135–145)
Total Bilirubin: 0.5 mg/dL (ref 0.0–1.2)
Total Protein: 7.3 g/dL (ref 6.5–8.1)

## 2024-04-22 LAB — CBC WITH DIFFERENTIAL (CANCER CENTER ONLY)
Abs Immature Granulocytes: 0.11 K/uL — ABNORMAL HIGH (ref 0.00–0.07)
Basophils Absolute: 0.1 K/uL (ref 0.0–0.1)
Basophils Relative: 1 %
Eosinophils Absolute: 0.1 K/uL (ref 0.0–0.5)
Eosinophils Relative: 2 %
HCT: 40.3 % (ref 39.0–52.0)
Hemoglobin: 13.8 g/dL (ref 13.0–17.0)
Immature Granulocytes: 2 %
Lymphocytes Relative: 27 %
Lymphs Abs: 1.8 K/uL (ref 0.7–4.0)
MCH: 33.3 pg (ref 26.0–34.0)
MCHC: 34.2 g/dL (ref 30.0–36.0)
MCV: 97.1 fL (ref 80.0–100.0)
Monocytes Absolute: 0.9 K/uL (ref 0.1–1.0)
Monocytes Relative: 14 %
Neutro Abs: 3.6 K/uL (ref 1.7–7.7)
Neutrophils Relative %: 54 %
Platelet Count: 179 K/uL (ref 150–400)
RBC: 4.15 MIL/uL — ABNORMAL LOW (ref 4.22–5.81)
RDW: 15.1 % (ref 11.5–15.5)
WBC Count: 6.7 K/uL (ref 4.0–10.5)
nRBC: 0 % (ref 0.0–0.2)

## 2024-04-22 MED ORDER — SODIUM CHLORIDE 0.9 % IV SOLN
156.4000 mg | Freq: Once | INTRAVENOUS | Status: AC
  Administered 2024-04-22: 160 mg via INTRAVENOUS
  Filled 2024-04-22: qty 16

## 2024-04-22 MED ORDER — SODIUM CHLORIDE 0.9 % IV SOLN: INTRAVENOUS | Status: DC

## 2024-04-22 MED ORDER — DIPHENHYDRAMINE HCL 50 MG/ML IJ SOLN
50.0000 mg | Freq: Once | INTRAMUSCULAR | Status: AC
  Administered 2024-04-22: 50 mg via INTRAVENOUS
  Filled 2024-04-22: qty 1

## 2024-04-22 MED ORDER — PALONOSETRON HCL INJECTION 0.25 MG/5ML
0.2500 mg | Freq: Once | INTRAVENOUS | Status: AC
  Administered 2024-04-22: 0.25 mg via INTRAVENOUS
  Filled 2024-04-22: qty 5

## 2024-04-22 MED ORDER — FAMOTIDINE IN NACL 20-0.9 MG/50ML-% IV SOLN
20.0000 mg | Freq: Once | INTRAVENOUS | Status: AC
  Administered 2024-04-22: 20 mg via INTRAVENOUS
  Filled 2024-04-22: qty 50

## 2024-04-22 MED ORDER — SODIUM CHLORIDE 0.9 % IV SOLN
45.0000 mg/m2 | Freq: Once | INTRAVENOUS | Status: AC
  Administered 2024-04-22: 78 mg via INTRAVENOUS
  Filled 2024-04-22: qty 13

## 2024-04-22 MED ORDER — DEXAMETHASONE SODIUM PHOSPHATE 10 MG/ML IJ SOLN
10.0000 mg | Freq: Once | INTRAMUSCULAR | Status: AC
  Administered 2024-04-22: 10 mg via INTRAVENOUS
  Filled 2024-04-22: qty 1

## 2024-04-22 NOTE — Patient Instructions (Signed)
 [facility] - A DEPT OF [facility]  Discharge Instructions: Thank you for choosing [facility] to provide your oncology and hematology care.   If you have a lab appointment with the Cancer Center, please go directly to the Cancer Center and check in at the registration area.   Wear comfortable clothing and clothing appropriate for easy access to any Portacath or PICC line.   We strive to give you quality time with your provider. You may need to reschedule your appointment if you arrive late (15 or more minutes).  Arriving late affects you and other patients whose appointments are after yours.  Also, if you miss three or more appointments without notifying the office, you may be dismissed from the clinic at the provider's discretion.      For prescription refill requests, have your pharmacy contact our office and allow 72 hours for refills to be completed.    Today you received the following chemotherapy and/or immunotherapy agents: Paclitaxel  (Taxol ) & Carboplatin  (Paraplatin )      To help prevent nausea and vomiting after your treatment, we encourage you to take your nausea medication as directed.  BELOW ARE SYMPTOMS THAT SHOULD BE REPORTED IMMEDIATELY: *FEVER GREATER THAN 100.4 F (38 C) OR HIGHER *CHILLS OR SWEATING *NAUSEA AND VOMITING THAT IS NOT CONTROLLED WITH YOUR NAUSEA MEDICATION *UNUSUAL SHORTNESS OF BREATH *UNUSUAL BRUISING OR BLEEDING *URINARY PROBLEMS (pain or burning when urinating, or frequent urination) *BOWEL PROBLEMS (unusual diarrhea, constipation, pain near the anus) TENDERNESS IN MOUTH AND THROAT WITH OR WITHOUT PRESENCE OF ULCERS (sore throat, sores in mouth, or a toothache) UNUSUAL RASH, SWELLING OR PAIN  UNUSUAL VAGINAL DISCHARGE OR ITCHING   Items with * indicate a potential emergency and should be followed up as soon as possible or go to the Emergency Department if any problems should occur.  Please show the  CHEMOTHERAPY ALERT CARD or IMMUNOTHERAPY ALERT CARD at check-in to the Emergency Department and triage nurse.  Should you have questions after your visit or need to cancel or reschedule your appointment, please contact [facility] - A DEPT OF [facility]  Dept: [phone]  and follow the prompts.  Office hours are 8:00 a.m. to 4:30 p.m. Monday - Friday. Please note that voicemails left after 4:00 p.m. may not be returned until the following business day.  We are closed weekends and major holidays. You have access to a nurse at all times for urgent questions. Please call the main number to the clinic Dept: [phone] and follow the prompts.   For any non-urgent questions, you may also contact your provider using MyChart. We now offer e-Visits for anyone 41 and older to request care online for non-urgent symptoms. For details visit [email or web address].   Also download the MyChart app! Go to the app store, search MyChart, open the app, select [facility], and log in with your MyChart username and password.

## 2024-04-23 ENCOUNTER — Other Ambulatory Visit: Payer: Self-pay

## 2024-04-23 ENCOUNTER — Ambulatory Visit
Admission: RE | Admit: 2024-04-23 | Discharge: 2024-04-23 | Disposition: A | Discharge status: Home / Self Care | Source: Ambulatory Visit | Attending: Radiation Oncology

## 2024-04-23 DIAGNOSIS — F17210 Nicotine dependence, cigarettes, uncomplicated: Secondary | ICD-10-CM | POA: Diagnosis not present

## 2024-04-23 DIAGNOSIS — C3412 Malignant neoplasm of upper lobe, left bronchus or lung: Secondary | ICD-10-CM | POA: Diagnosis not present

## 2024-04-23 DIAGNOSIS — Z510 Encounter for antineoplastic radiation therapy: Secondary | ICD-10-CM | POA: Diagnosis not present

## 2024-04-23 LAB — RAD ONC ARIA SESSION SUMMARY
Course Elapsed Days: 1
Plan Fractions Treated to Date: 2
Plan Prescribed Dose Per Fraction: 2 Gy
Plan Total Fractions Prescribed: 30
Plan Total Prescribed Dose: 60 Gy
Reference Point Dosage Given to Date: 4 Gy
Reference Point Session Dosage Given: 2 Gy
Session Number: 2

## 2024-04-24 ENCOUNTER — Ambulatory Visit
Admission: RE | Admit: 2024-04-24 | Discharge: 2024-04-24 | Disposition: A | Discharge status: Home / Self Care | Source: Ambulatory Visit | Attending: Radiation Oncology | Admitting: Radiation Oncology

## 2024-04-24 ENCOUNTER — Other Ambulatory Visit: Payer: Self-pay

## 2024-04-24 ENCOUNTER — Telehealth: Payer: Self-pay

## 2024-04-24 DIAGNOSIS — I2510 Atherosclerotic heart disease of native coronary artery without angina pectoris: Secondary | ICD-10-CM | POA: Diagnosis not present

## 2024-04-24 DIAGNOSIS — Z510 Encounter for antineoplastic radiation therapy: Secondary | ICD-10-CM | POA: Diagnosis not present

## 2024-04-24 LAB — RAD ONC ARIA SESSION SUMMARY
Course Elapsed Days: 2
Plan Fractions Treated to Date: 3
Plan Prescribed Dose Per Fraction: 2 Gy
Plan Total Fractions Prescribed: 30
Plan Total Prescribed Dose: 60 Gy
Reference Point Dosage Given to Date: 6 Gy
Reference Point Session Dosage Given: 2 Gy
Session Number: 3

## 2024-04-24 NOTE — Telephone Encounter (Signed)
 He knows to call the office at [phone] if he has any questions or concerns.

## 2024-04-24 NOTE — Telephone Encounter (Signed)
-----   Message from Nurse Emmalee P sent at 04/22/2024  4:44 PM EDT ----- Regarding: Dr. Sherrod First Timer Pt Call Back Dr. Sherrod First Timer Pt Call Back   [name] completed first time Paclitaxel  and Carboplatin  infusion (04/22/2024) with no issues. Vital signs stable at discharge and ambulatory to lobby.

## 2024-04-25 ENCOUNTER — Other Ambulatory Visit: Payer: Self-pay

## 2024-04-25 ENCOUNTER — Ambulatory Visit
Admission: RE | Admit: 2024-04-25 | Discharge: 2024-04-25 | Disposition: A | Discharge status: Home / Self Care | Source: Ambulatory Visit | Attending: Radiation Oncology | Admitting: Radiation Oncology

## 2024-04-25 DIAGNOSIS — Z510 Encounter for antineoplastic radiation therapy: Secondary | ICD-10-CM | POA: Diagnosis not present

## 2024-04-25 LAB — RAD ONC ARIA SESSION SUMMARY
Course Elapsed Days: 3
Plan Fractions Treated to Date: 4
Plan Prescribed Dose Per Fraction: 2 Gy
Plan Total Fractions Prescribed: 30
Plan Total Prescribed Dose: 60 Gy
Reference Point Dosage Given to Date: 8 Gy
Reference Point Session Dosage Given: 2 Gy
Session Number: 4

## 2024-04-26 ENCOUNTER — Ambulatory Visit
Admission: RE | Admit: 2024-04-26 | Discharge: 2024-04-26 | Disposition: A | Discharge status: Home / Self Care | Source: Ambulatory Visit | Attending: Radiation Oncology | Admitting: Radiation Oncology

## 2024-04-26 ENCOUNTER — Other Ambulatory Visit: Payer: Self-pay

## 2024-04-26 DIAGNOSIS — I252 Old myocardial infarction: Secondary | ICD-10-CM | POA: Diagnosis not present

## 2024-04-26 DIAGNOSIS — Z510 Encounter for antineoplastic radiation therapy: Secondary | ICD-10-CM | POA: Diagnosis not present

## 2024-04-26 DIAGNOSIS — I2510 Atherosclerotic heart disease of native coronary artery without angina pectoris: Secondary | ICD-10-CM | POA: Diagnosis not present

## 2024-04-26 DIAGNOSIS — C3491 Malignant neoplasm of unspecified part of right bronchus or lung: Secondary | ICD-10-CM

## 2024-04-26 DIAGNOSIS — Z5111 Encounter for antineoplastic chemotherapy: Secondary | ICD-10-CM | POA: Diagnosis not present

## 2024-04-26 DIAGNOSIS — F17210 Nicotine dependence, cigarettes, uncomplicated: Secondary | ICD-10-CM | POA: Diagnosis not present

## 2024-04-26 DIAGNOSIS — Z79633 Long term (current) use of mitotic inhibitor: Secondary | ICD-10-CM | POA: Diagnosis not present

## 2024-04-26 DIAGNOSIS — I10 Essential (primary) hypertension: Secondary | ICD-10-CM | POA: Diagnosis not present

## 2024-04-26 DIAGNOSIS — J449 Chronic obstructive pulmonary disease, unspecified: Secondary | ICD-10-CM | POA: Diagnosis not present

## 2024-04-26 DIAGNOSIS — Z79630 Long term (current) use of alkylating agent: Secondary | ICD-10-CM | POA: Diagnosis not present

## 2024-04-26 LAB — RAD ONC ARIA SESSION SUMMARY
Course Elapsed Days: 4
Plan Fractions Treated to Date: 5
Plan Prescribed Dose Per Fraction: 2 Gy
Plan Total Fractions Prescribed: 30
Plan Total Prescribed Dose: 60 Gy
Reference Point Dosage Given to Date: 10 Gy
Reference Point Session Dosage Given: 2 Gy
Session Number: 5

## 2024-04-26 MED ORDER — SONAFINE EX EMUL
1.0000 | Freq: Once | CUTANEOUS | Status: AC
  Administered 2024-04-26: 1 via TOPICAL

## 2024-04-29 ENCOUNTER — Inpatient Hospital Stay: Admitting: Internal Medicine

## 2024-04-29 ENCOUNTER — Inpatient Hospital Stay

## 2024-04-29 ENCOUNTER — Other Ambulatory Visit

## 2024-04-29 ENCOUNTER — Ambulatory Visit
Admission: RE | Admit: 2024-04-29 | Discharge: 2024-04-29 | Disposition: A | Discharge status: Home / Self Care | Source: Ambulatory Visit | Attending: Radiation Oncology | Admitting: Radiation Oncology

## 2024-04-29 ENCOUNTER — Other Ambulatory Visit: Payer: Self-pay

## 2024-04-29 VITALS — BP 118/72 | HR 68 | Temp 97.9°F | Resp 17 | Ht 67.0 in | Wt 135.0 lb

## 2024-04-29 DIAGNOSIS — Z510 Encounter for antineoplastic radiation therapy: Secondary | ICD-10-CM | POA: Diagnosis not present

## 2024-04-29 DIAGNOSIS — I2510 Atherosclerotic heart disease of native coronary artery without angina pectoris: Secondary | ICD-10-CM | POA: Diagnosis not present

## 2024-04-29 DIAGNOSIS — C3492 Malignant neoplasm of unspecified part of left bronchus or lung: Secondary | ICD-10-CM

## 2024-04-29 DIAGNOSIS — F17210 Nicotine dependence, cigarettes, uncomplicated: Secondary | ICD-10-CM | POA: Diagnosis not present

## 2024-04-29 DIAGNOSIS — J449 Chronic obstructive pulmonary disease, unspecified: Secondary | ICD-10-CM | POA: Diagnosis not present

## 2024-04-29 DIAGNOSIS — Z5111 Encounter for antineoplastic chemotherapy: Secondary | ICD-10-CM | POA: Diagnosis not present

## 2024-04-29 DIAGNOSIS — I252 Old myocardial infarction: Secondary | ICD-10-CM | POA: Diagnosis not present

## 2024-04-29 DIAGNOSIS — I10 Essential (primary) hypertension: Secondary | ICD-10-CM | POA: Diagnosis not present

## 2024-04-29 DIAGNOSIS — Z79630 Long term (current) use of alkylating agent: Secondary | ICD-10-CM | POA: Diagnosis not present

## 2024-04-29 DIAGNOSIS — C3412 Malignant neoplasm of upper lobe, left bronchus or lung: Secondary | ICD-10-CM | POA: Diagnosis not present

## 2024-04-29 LAB — RAD ONC ARIA SESSION SUMMARY
Course Elapsed Days: 7
Plan Fractions Treated to Date: 6
Plan Prescribed Dose Per Fraction: 2 Gy
Plan Total Fractions Prescribed: 30
Plan Total Prescribed Dose: 60 Gy
Reference Point Dosage Given to Date: 12 Gy
Reference Point Session Dosage Given: 2 Gy
Session Number: 6

## 2024-04-29 LAB — CBC WITH DIFFERENTIAL (CANCER CENTER ONLY)
Abs Immature Granulocytes: 0.06 K/uL (ref 0.00–0.07)
Basophils Absolute: 0 K/uL (ref 0.0–0.1)
Basophils Relative: 1 %
Eosinophils Absolute: 0.2 K/uL (ref 0.0–0.5)
Eosinophils Relative: 3 %
HCT: 37.7 % — ABNORMAL LOW (ref 39.0–52.0)
Hemoglobin: 12.7 g/dL — ABNORMAL LOW (ref 13.0–17.0)
Immature Granulocytes: 1 %
Lymphocytes Relative: 27 %
Lymphs Abs: 1.4 K/uL (ref 0.7–4.0)
MCH: 33.2 pg (ref 26.0–34.0)
MCHC: 33.7 g/dL (ref 30.0–36.0)
MCV: 98.4 fL (ref 80.0–100.0)
Monocytes Absolute: 0.5 K/uL (ref 0.1–1.0)
Monocytes Relative: 10 %
Neutro Abs: 2.9 K/uL (ref 1.7–7.7)
Neutrophils Relative %: 58 %
Platelet Count: 160 K/uL (ref 150–400)
RBC: 3.83 MIL/uL — ABNORMAL LOW (ref 4.22–5.81)
RDW: 15 % (ref 11.5–15.5)
WBC Count: 5.1 K/uL (ref 4.0–10.5)
nRBC: 0 % (ref 0.0–0.2)

## 2024-04-29 LAB — CMP (CANCER CENTER ONLY)
ALT: 11 U/L (ref 0–44)
AST: 16 U/L (ref 15–41)
Albumin: 3.8 g/dL (ref 3.5–5.0)
Alkaline Phosphatase: 64 U/L (ref 38–126)
Anion gap: 4 — ABNORMAL LOW (ref 5–15)
BUN: 16 mg/dL (ref 8–23)
CO2: 30 mmol/L (ref 22–32)
Calcium: 8.8 mg/dL — ABNORMAL LOW (ref 8.9–10.3)
Chloride: 101 mmol/L (ref 98–111)
Creatinine: 0.71 mg/dL (ref 0.61–1.24)
GFR, Estimated: >60 mL/min (ref >60)
Glucose, Bld: 95 mg/dL (ref 70–99)
Potassium: 4.2 mmol/L (ref 3.5–5.1)
Sodium: 135 mmol/L (ref 135–145)
Total Bilirubin: 1 mg/dL (ref 0.0–1.2)
Total Protein: 7.3 g/dL (ref 6.5–8.1)

## 2024-04-29 MED ORDER — PALONOSETRON HCL INJECTION 0.25 MG/5ML
0.2500 mg | Freq: Once | INTRAVENOUS | Status: AC
  Administered 2024-04-29: 0.25 mg via INTRAVENOUS
  Filled 2024-04-29: qty 5

## 2024-04-29 MED ORDER — DEXAMETHASONE SODIUM PHOSPHATE 10 MG/ML IJ SOLN
10.0000 mg | Freq: Once | INTRAMUSCULAR | Status: AC
  Administered 2024-04-29: 10 mg via INTRAVENOUS
  Filled 2024-04-29: qty 1

## 2024-04-29 MED ORDER — SODIUM CHLORIDE 0.9 % IV SOLN
156.4000 mg | Freq: Once | INTRAVENOUS | Status: AC
  Administered 2024-04-29: 160 mg via INTRAVENOUS
  Filled 2024-04-29: qty 16

## 2024-04-29 MED ORDER — DIPHENHYDRAMINE HCL 50 MG/ML IJ SOLN
50.0000 mg | Freq: Once | INTRAMUSCULAR | Status: AC
  Administered 2024-04-29: 50 mg via INTRAVENOUS
  Filled 2024-04-29: qty 1

## 2024-04-29 MED ORDER — SODIUM CHLORIDE 0.9 % IV SOLN: INTRAVENOUS | Status: DC

## 2024-04-29 MED ORDER — SODIUM CHLORIDE 0.9 % IV SOLN
45.0000 mg/m2 | Freq: Once | INTRAVENOUS | Status: AC
  Administered 2024-04-29: 78 mg via INTRAVENOUS
  Filled 2024-04-29: qty 13

## 2024-04-29 MED ORDER — FAMOTIDINE IN NACL 20-0.9 MG/50ML-% IV SOLN
20.0000 mg | Freq: Once | INTRAVENOUS | Status: AC
  Administered 2024-04-29: 20 mg via INTRAVENOUS
  Filled 2024-04-29: qty 50

## 2024-04-29 NOTE — Progress Notes (Signed)
 [facility] Telephone:([phone]) [phone]   Fax:([phone]  OFFICE PROGRESS NOTE  [name], MD [address] [place] [address]  DIAGNOSIS: Stage IIIa (T1b, N2, M0) non-small cell lung cancer, squamous cell carcinoma presented with left upper lobe lung nodule in addition to left hilar and mediastinal lymphadenopathy. This was diagnosed in August 2025.    PRIOR THERAPY: The patient also has a history of stage IIb (T3, N0, M0) non-small cell lung cancer, adenocarcinoma status post right upper lobectomy with lymph node dissection at [facility] on May 17, 2012 with a tumor size of 6.5 cm. He was also treated with adjuvant chemotherapy at that time.   CURRENT THERAPY: A course of concurrent chemoradiation with weekly carboplatin  for AUC of 2 and paclitaxel  45 mg/M2 status post 1 cycle.  INTERVAL HISTORY: Stephen Leon 78 y.o. male returns to the clinic today for follow-up visit. Discussed the use of AI scribe software for clinical note transcription with the patient, who gave verbal consent to proceed.  History of Present Illness Stephen Leon is a 78 year old male with stage IIIA non-small cell lung cancer who presents with worsening cough, difficulty swallowing, and shortness of breath after starting chemoradiation.  He was diagnosed with stage IIIA non-small cell lung cancer in August 2025, presenting with a left upper lobe lung nodule and left hilar and mediastinal lymphadenopathy. He is currently undergoing concurrent chemoradiation, receiving weekly carboplatin  and paclitaxel .  Since starting treatment, he has experienced a worsening cough, increased difficulty swallowing, particularly with fluids, and shortness of breath. He is unsure if these symptoms are due to chemotherapy or radiation. He has been using Desenex or Tigan for the cough, taking it twice a week.  He mentions a  weight loss of about 15 pounds over the past couple of months prior to starting treatment, but notes a slight weight gain recently, with his weight stabilizing at 135 pounds from the previous week.     MEDICAL HISTORY: Past Medical History:  Diagnosis Date   Anxiety    Bronchitis    Cancer (HCC) 2016   lung   COPD (chronic obstructive pulmonary disease) (HCC)    Coronary artery disease 09/26/2023   Cough 07/13/2007   Qualifier: Diagnosis of  By: Georgian ROSALEA CHARM Lamar    Dyspnea    due to copd - w/exertion   GERD (gastroesophageal reflux disease)    Hypertension    pt denies this and is not on bp meds but vascular note states htn   LUNG NODULE 07/13/2007   Qualifier: Diagnosis of  By: Georgian ROSALEA CHARM Lamar    Myocardial infarction [facility]) 2023   mild per patient   Nodule of left lung 03/2024   upper lobe   Pneumonia 2013   Thoracic aortic aneurysm (HCC)    followed by vascular    ALLERGIES:  has no known allergies.  MEDICATIONS:  Current Outpatient Medications  Medication Sig Dispense Refill   albuterol  (VENTOLIN  HFA) 108 (90 Base) MCG/ACT inhaler Inhale 2 puffs into the lungs every 6 (six) hours as needed.     aspirin  EC 81 MG tablet Take 81 mg by mouth daily. Swallow whole.     budesonide-glycopyrrolate -formoterol (BREZTRI  AEROSPHERE) 160-9-4.8 MCG/ACT AERO inhaler Inhale 2 puffs into the lungs in the morning and at bedtime. 11.8 g 0   chlorpheniramine-HYDROcodone  (TUSSIONEX) 10-8 MG/5ML Take 5 mLs by mouth at bedtime as needed. 115 mL  0   dexamethasone  (DECADRON ) 4 MG tablet Take 2 tablets daily for 2 days, start the day after chemotherapy. Take with food. 30 tablet 1   gabapentin  (NEURONTIN ) 800 MG tablet Take 800 mg by mouth 2 (two) times daily.      HYDROcodone -acetaminophen  (NORCO/VICODIN) 5-325 MG tablet Take 1 tablet by mouth every 12 (twelve) hours as needed for moderate pain (pain score 4-6). 30 tablet 0   ibuprofen  (ADVIL ) 800 MG tablet Take 800 mg by mouth every 8 (eight)  hours as needed for mild pain.     ipratropium-albuterol  (DUONEB) 0.5-2.5 (3) MG/3ML SOLN Inhale 3 mLs into the lungs every 4 (four) hours as needed (When he get sick and the doctor tells him to take it).     lidocaine -prilocaine  (EMLA ) cream Apply to affected area once 30 g 3   omeprazole (PRILOSEC) 40 MG capsule Take 40 mg by mouth daily.     ondansetron  (ZOFRAN ) 8 MG tablet Take 1 tablet (8 mg total) by mouth every 8 (eight) hours as needed for nausea or vomiting. Start on the third day after chemotherapy. 30 tablet 1   prochlorperazine  (COMPAZINE ) 10 MG tablet Take 1 tablet (10 mg total) by mouth every 6 (six) hours as needed for nausea or vomiting. 30 tablet 1   tamsulosin (FLOMAX) 0.4 MG CAPS capsule Take 0.4 mg by mouth at bedtime.     budesonide-glycopyrrolate -formoterol (BREZTRI  AEROSPHERE) 160-9-4.8 MCG/ACT AERO inhaler Inhale 2 puffs into the lungs in the morning and at bedtime. (Patient not taking: Reported on 04/29/2024)     No current facility-administered medications for this visit.    SURGICAL HISTORY:  Past Surgical History:  Procedure Laterality Date   BIOPSY  07/13/2023   Procedure: BIOPSY;  Surgeon: Onita Elspeth Sharper, DO;  Location: [facility] ENDOSCOPY;  Service: Gastroenterology;;   BRONCHIAL BRUSHINGS  03/26/2024   Procedure: BRONCHOSCOPY, WITH BRUSH BIOPSY;  Surgeon: [name], MD;  Location: [facility] ENDOSCOPY;  Service: Pulmonary;;   BRONCHIAL NEEDLE ASPIRATION BIOPSY  03/26/2024   Procedure: BRONCHOSCOPY, WITH NEEDLE ASPIRATION BIOPSY;  Surgeon: [name], MD;  Location: [facility] ENDOSCOPY;  Service: Pulmonary;;   COLONOSCOPY N/A 10/27/2022   hx polyps; Procedure: COLONOSCOPY;  Surgeon: Onita Elspeth Sharper, DO;  Location: [facility] ENDOSCOPY;  Service: Gastroenterology;  Laterality: N/A;   COLONOSCOPY WITH PROPOFOL  N/A 07/13/2023   Procedure: COLONOSCOPY WITH PROPOFOL ;  Surgeon: Onita Elspeth Sharper, DO;  Location: [facility] ENDOSCOPY;  Service: Gastroenterology;  Laterality:  N/A;   CORONARY PRESSURE/FFR STUDY N/A 05/19/2020   Procedure: INTRAVASCULAR PRESSURE WIRE/FFR STUDY;  Surgeon: Ammon Blunt, MD;  Location: [facility] INVASIVE CV LAB;  Service: Cardiovascular;  Laterality: N/A;   DIAGNOSTIC LAPAROSCOPY     ENDOBRONCHIAL ULTRASOUND Left 03/26/2024   Procedure: ENDOBRONCHIAL ULTRASOUND (EBUS);  Surgeon: [name], MD;  Location: [facility] ENDOSCOPY;  Service: Pulmonary;  Laterality: Left;   ESOPHAGOGASTRODUODENOSCOPY N/A 10/27/2022   Procedure: ESOPHAGOGASTRODUODENOSCOPY (EGD);  Surgeon: Onita Elspeth Sharper, DO;  Location: [facility] ENDOSCOPY;  Service: Gastroenterology;  Laterality: N/A;   EYE SURGERY Bilateral    HERNIA REPAIR     KYPHOPLASTY N/A 12/13/2018   Procedure: KYPHOPLASTY- T7;  Surgeon: Kathlynn Sharper, MD;  Location: [facility] ORS;  Service: Orthopedics;  Laterality: N/A;   LEFT HEART CATH N/A 05/11/2022   Procedure: Left Heart Cath;  Surgeon: Ammon Blunt, MD;  Location: [facility] INVASIVE CV LAB;  Service: Cardiovascular;  Laterality: N/A;   LEFT HEART CATH AND CORONARY ANGIOGRAPHY Left 05/19/2020   Procedure: LEFT HEART CATH AND CORONARY ANGIOGRAPHY;  Surgeon: Ammon Blunt, MD;  Location: [facility] INVASIVE CV LAB;  Service: Cardiovascular;  Laterality: Left;   Robotic assisted Right upper lobectomy Right 05/17/2012   [facility]   VIDEO BRONCHOSCOPY WITH ENDOBRONCHIAL NAVIGATION Left 03/26/2024   Procedure: VIDEO BRONCHOSCOPY WITH ENDOBRONCHIAL NAVIGATION;  Surgeon: [name], MD;  Location: [facility] ENDOSCOPY;  Service: Pulmonary;  Laterality: Left;   XI ROBOTIC ASSISTED INGUINAL HERNIA REPAIR WITH MESH Right 07/19/2019   Procedure: XI ROBOTIC ASSISTED INGUINAL HERNIA REPAIR WITH MESH;  Surgeon: Tye Millet, DO;  Location: [facility] ORS;  Service: General;  Laterality: Right;    REVIEW OF SYSTEMS:  Constitutional: positive for fatigue Eyes: negative Ears, nose, mouth, throat, and face: negative Respiratory: positive for  cough and dyspnea on exertion Cardiovascular: negative Gastrointestinal: positive for odynophagia Genitourinary:negative Integument/breast: negative Hematologic/lymphatic: negative Musculoskeletal:negative Neurological: negative Behavioral/Psych: negative Endocrine: negative Allergic/Immunologic: negative   PHYSICAL EXAMINATION: General appearance: alert, cooperative, fatigued, and no distress Head: Normocephalic, without obvious abnormality, atraumatic Neck: no adenopathy, no JVD, supple, symmetrical, trachea midline, and thyroid  not enlarged, symmetric, no tenderness/mass/nodules Lymph nodes: Cervical, supraclavicular, and axillary nodes normal. Resp: clear to auscultation bilaterally Back: symmetric, no curvature. ROM normal. No CVA tenderness. Cardio: regular rate and rhythm, S1, S2 normal, no murmur, click, rub or gallop GI: soft, non-tender; bowel sounds normal; no masses,  no organomegaly Extremities: extremities normal, atraumatic, no cyanosis or edema Neurologic: Alert and oriented X 3, normal strength and tone. Normal symmetric reflexes. Normal coordination and gait  ECOG PERFORMANCE STATUS: 1 - Symptomatic but completely ambulatory  Blood pressure 118/72, pulse 68, temperature 97.9 F (36.6 C), resp. rate 17, height 5' 7 (1.702 m), weight 135 lb (61.2 kg), SpO2 96%.  LABORATORY DATA: Lab Results  Component Value Date   WBC 5.1 04/29/2024   HGB 12.7 (L) 04/29/2024   HCT 37.7 (L) 04/29/2024   MCV 98.4 04/29/2024   PLT 160 04/29/2024      Chemistry      Component Value Date/Time   NA 135 04/29/2024 1038   K 4.2 04/29/2024 1038   CL 101 04/29/2024 1038   CO2 30 04/29/2024 1038   BUN 16 04/29/2024 1038   CREATININE 0.71 04/29/2024 1038      Component Value Date/Time   CALCIUM  8.8 (L) 04/29/2024 1038   ALKPHOS 64 04/29/2024 1038   AST 16 04/29/2024 1038   ALT 11 04/29/2024 1038   BILITOT 1.0 04/29/2024 1038       RADIOGRAPHIC STUDIES: MR BRAIN W WO  CONTRAST Result Date: 04/10/2024 MR BRAIN WITHOUT THEN WITH IV CONTRAST COMPARISON: 05/02/2012 CLINICAL HISTORY: Non-small cell lung cancer, staging TECHNIQUE: MRI of the brain with and without 6 mL of gadolinium based IV contrast. 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 This has significantly progressed compared with the prior examination. Correlation for interval ischemic changes. No suspicious enhancement following contrast administration to suggest metastatic disease. Diffusion weighted images demonstrate no acute ischemic event. There is no midline shift, mass effect, hydrocephalus or hemorrhage. Appropriate flow voids are seen in the intracranial vessels. The visualized paranasal sinuses, orbits and mastoid air cells are clear. IMPRESSION: Cortical atrophy and significant progression of periventricular white matter disease likely reflective of interval change. No evidence to suggest intracranial metastatic disease. Electronically signed by: Norleen Satchel MD 04/10/2024 11:14 AM EDT RP Workstation: [id number]    ASSESSMENT AND PLAN: This is a very pleasant 78 years old white male diagnosed with  stage IIIa (T1b, N2, M0) non-small cell lung cancer, squamous cell carcinoma presented with left upper lobe lung nodule in addition to left hilar and mediastinal lymphadenopathy. This was diagnosed in August 2025. The patient also has a history of stage IIb (T3, N0, M0) non-small cell lung cancer, adenocarcinoma status post right upper lobectomy with lymph node dissection at [facility] on May 17, 2012 with a tumor size of 6.5 cm. He was also treated with adjuvant chemotherapy at that time.  The patient is currently undergoing a course of concurrent chemoradiation with weekly carboplatin  for AUC of 2 and paclitaxel  45 mg/M2.  Status post 1 cycle. Assessment and Plan Assessment & Plan Stage IIIA non-small  cell lung cancer of the left upper lobe with intrathoracic lymph node involvement undergoing concurrent chemoradiation Stage IIIA non-small cell lung cancer located in the left upper lobe with intrathoracic lymph node involvement. Currently undergoing concurrent chemoradiation with weekly carboplatin  and paclitaxel . Post one cycle of treatment. Symptoms of cough, dysphagia, and shortness of breath likely related to radiation therapy. - Continue concurrent chemoradiation with weekly carboplatin  and paclitaxel . - Advise to report symptoms to radiation oncologist, Dr. Dewey, for potential adjustment of radiation beam.  Radiation-induced esophagitis with dysphagia Dysphagia likely due to radiation-induced esophagitis. Symptoms include difficulty swallowing, particularly with fluids. - Encourage intake of moderate temperature liquids and bland foods. - Advise to avoid spicy and very hot foods.  Radiation-induced cough and shortness of breath Cough and shortness of breath likely secondary to radiation therapy. Symptoms may worsen as treatment progresses. - Advise use of Desenex or Tigan twice a day for cough relief. - Report symptoms to radiation oncologist for potential adjustment of treatment.  Unintentional weight loss, now stable Previously experienced unintentional weight loss of 15 pounds over a couple of months. Currently stable at 135 pounds. - Encourage adequate fluid intake to prevent dehydration. - Monitor weight to ensure stability. He was advised to call immediately if he has any other concerning symptoms in the interval. The patient voices understanding of current disease status and treatment options and is in agreement with the current care plan.  All questions were answered. The patient knows to call the clinic with any problems, questions or concerns. We can certainly see the patient much sooner if necessary.  The total time spent in the appointment was 30 minutes including review  of chart and various tests results, discussions about plan of care and coordination of care plan .   Disclaimer: This note was dictated with voice recognition software. Similar sounding words can inadvertently be transcribed and may not be corrected upon review.

## 2024-04-29 NOTE — Patient Instructions (Signed)
 [facility] - A DEPT OF [facility]  Discharge Instructions: Thank you for choosing [facility] to provide your oncology and hematology care.   If you have a lab appointment with the Cancer Center, please go directly to the Cancer Center and check in at the registration area.   Wear comfortable clothing and clothing appropriate for easy access to any Portacath or PICC line.   We strive to give you quality time with your provider. You may need to reschedule your appointment if you arrive late (15 or more minutes).  Arriving late affects you and other patients whose appointments are after yours.  Also, if you miss three or more appointments without notifying the office, you may be dismissed from the clinic at the provider's discretion.      For prescription refill requests, have your pharmacy contact our office and allow 72 hours for refills to be completed.    Today you received the following chemotherapy and/or immunotherapy agents taxol and carboplatin      To help prevent nausea and vomiting after your treatment, we encourage you to take your nausea medication as directed.  BELOW ARE SYMPTOMS THAT SHOULD BE REPORTED IMMEDIATELY: *FEVER GREATER THAN 100.4 F (38 C) OR HIGHER *CHILLS OR SWEATING *NAUSEA AND VOMITING THAT IS NOT CONTROLLED WITH YOUR NAUSEA MEDICATION *UNUSUAL SHORTNESS OF BREATH *UNUSUAL BRUISING OR BLEEDING *URINARY PROBLEMS (pain or burning when urinating, or frequent urination) *BOWEL PROBLEMS (unusual diarrhea, constipation, pain near the anus) TENDERNESS IN MOUTH AND THROAT WITH OR WITHOUT PRESENCE OF ULCERS (sore throat, sores in mouth, or a toothache) UNUSUAL RASH, SWELLING OR PAIN  UNUSUAL VAGINAL DISCHARGE OR ITCHING   Items with * indicate a potential emergency and should be followed up as soon as possible or go to the Emergency Department if any problems should occur.  Please show the CHEMOTHERAPY ALERT CARD or  IMMUNOTHERAPY ALERT CARD at check-in to the Emergency Department and triage nurse.  Should you have questions after your visit or need to cancel or reschedule your appointment, please contact [facility] - A [facility]  Dept: [phone]  and follow the prompts.  Office hours are 8:00 a.m. to 4:30 p.m. Monday - Friday. Please note that voicemails left after 4:00 p.m. may not be returned until the following business day.  We are closed weekends and major holidays. You have access to a nurse at all times for urgent questions. Please call the main number to the clinic Dept: [phone] and follow the prompts.   For any non-urgent questions, you may also contact your provider using MyChart. We now offer e-Visits for anyone 62 and older to request care online for non-urgent symptoms. For details visit [email or web address].   Also download the MyChart app! Go to the app store, search "MyChart", open the app, select [place], and log in with your MyChart username and password.

## 2024-04-30 ENCOUNTER — Ambulatory Visit
Admission: RE | Admit: 2024-04-30 | Discharge: 2024-04-30 | Disposition: A | Discharge status: Home / Self Care | Source: Ambulatory Visit | Attending: Radiation Oncology

## 2024-04-30 ENCOUNTER — Other Ambulatory Visit: Payer: Self-pay

## 2024-04-30 DIAGNOSIS — Z510 Encounter for antineoplastic radiation therapy: Secondary | ICD-10-CM | POA: Diagnosis not present

## 2024-04-30 DIAGNOSIS — I10 Essential (primary) hypertension: Secondary | ICD-10-CM | POA: Diagnosis not present

## 2024-04-30 LAB — RAD ONC ARIA SESSION SUMMARY
Course Elapsed Days: 8
Plan Fractions Treated to Date: 7
Plan Prescribed Dose Per Fraction: 2 Gy
Plan Total Fractions Prescribed: 30
Plan Total Prescribed Dose: 60 Gy
Reference Point Dosage Given to Date: 14 Gy
Reference Point Session Dosage Given: 2 Gy
Session Number: 7

## 2024-05-01 ENCOUNTER — Ambulatory Visit
Admission: RE | Admit: 2024-05-01 | Discharge: 2024-05-01 | Disposition: A | Discharge status: Home / Self Care | Source: Ambulatory Visit | Attending: Radiation Oncology

## 2024-05-01 ENCOUNTER — Other Ambulatory Visit: Payer: Self-pay

## 2024-05-01 DIAGNOSIS — Z510 Encounter for antineoplastic radiation therapy: Secondary | ICD-10-CM | POA: Diagnosis not present

## 2024-05-01 DIAGNOSIS — Z5111 Encounter for antineoplastic chemotherapy: Secondary | ICD-10-CM | POA: Diagnosis not present

## 2024-05-01 LAB — RAD ONC ARIA SESSION SUMMARY
Course Elapsed Days: 9
Plan Fractions Treated to Date: 8
Plan Prescribed Dose Per Fraction: 2 Gy
Plan Total Fractions Prescribed: 30
Plan Total Prescribed Dose: 60 Gy
Reference Point Dosage Given to Date: 16 Gy
Reference Point Session Dosage Given: 2 Gy
Session Number: 8

## 2024-05-02 ENCOUNTER — Ambulatory Visit
Admission: RE | Admit: 2024-05-02 | Discharge: 2024-05-02 | Disposition: A | Discharge status: Home / Self Care | Source: Ambulatory Visit | Attending: Radiation Oncology | Admitting: Radiation Oncology

## 2024-05-02 ENCOUNTER — Ambulatory Visit
Admission: RE | Admit: 2024-05-02 | Discharge: 2024-05-02 | Disposition: A | Discharge status: Home / Self Care | Source: Ambulatory Visit | Attending: Radiation Oncology

## 2024-05-02 ENCOUNTER — Other Ambulatory Visit: Payer: Self-pay

## 2024-05-02 DIAGNOSIS — F17210 Nicotine dependence, cigarettes, uncomplicated: Secondary | ICD-10-CM | POA: Diagnosis not present

## 2024-05-02 DIAGNOSIS — Z510 Encounter for antineoplastic radiation therapy: Secondary | ICD-10-CM | POA: Diagnosis not present

## 2024-05-02 LAB — RAD ONC ARIA SESSION SUMMARY
Course Elapsed Days: 10
Plan Fractions Treated to Date: 9
Plan Prescribed Dose Per Fraction: 2 Gy
Plan Total Fractions Prescribed: 30
Plan Total Prescribed Dose: 60 Gy
Reference Point Dosage Given to Date: 18 Gy
Reference Point Session Dosage Given: 2 Gy
Session Number: 9

## 2024-05-03 ENCOUNTER — Other Ambulatory Visit: Payer: Self-pay

## 2024-05-03 ENCOUNTER — Ambulatory Visit
Admission: RE | Admit: 2024-05-03 | Discharge: 2024-05-03 | Disposition: A | Discharge status: Home / Self Care | Source: Ambulatory Visit | Attending: Radiation Oncology | Admitting: Radiation Oncology

## 2024-05-03 DIAGNOSIS — F17210 Nicotine dependence, cigarettes, uncomplicated: Secondary | ICD-10-CM | POA: Diagnosis not present

## 2024-05-03 DIAGNOSIS — C3412 Malignant neoplasm of upper lobe, left bronchus or lung: Secondary | ICD-10-CM | POA: Diagnosis not present

## 2024-05-03 DIAGNOSIS — Z510 Encounter for antineoplastic radiation therapy: Secondary | ICD-10-CM | POA: Diagnosis not present

## 2024-05-03 LAB — RAD ONC ARIA SESSION SUMMARY
Course Elapsed Days: 11
Plan Fractions Treated to Date: 10
Plan Prescribed Dose Per Fraction: 2 Gy
Plan Total Fractions Prescribed: 30
Plan Total Prescribed Dose: 60 Gy
Reference Point Dosage Given to Date: 20 Gy
Reference Point Session Dosage Given: 2 Gy
Session Number: 10

## 2024-05-06 ENCOUNTER — Inpatient Hospital Stay

## 2024-05-06 ENCOUNTER — Ambulatory Visit
Admission: RE | Admit: 2024-05-06 | Discharge: 2024-05-06 | Disposition: A | Discharge status: Home / Self Care | Source: Ambulatory Visit | Attending: Radiation Oncology | Admitting: Radiation Oncology

## 2024-05-06 ENCOUNTER — Other Ambulatory Visit: Payer: Self-pay

## 2024-05-06 VITALS — BP 141/81 | HR 62 | Temp 98.3°F | Resp 16 | Wt 134.1 lb

## 2024-05-06 DIAGNOSIS — Z510 Encounter for antineoplastic radiation therapy: Secondary | ICD-10-CM | POA: Diagnosis not present

## 2024-05-06 DIAGNOSIS — Z5111 Encounter for antineoplastic chemotherapy: Secondary | ICD-10-CM | POA: Diagnosis not present

## 2024-05-06 DIAGNOSIS — I10 Essential (primary) hypertension: Secondary | ICD-10-CM | POA: Diagnosis not present

## 2024-05-06 DIAGNOSIS — J449 Chronic obstructive pulmonary disease, unspecified: Secondary | ICD-10-CM | POA: Diagnosis not present

## 2024-05-06 DIAGNOSIS — C3492 Malignant neoplasm of unspecified part of left bronchus or lung: Secondary | ICD-10-CM

## 2024-05-06 DIAGNOSIS — Z79630 Long term (current) use of alkylating agent: Secondary | ICD-10-CM | POA: Diagnosis not present

## 2024-05-06 DIAGNOSIS — I252 Old myocardial infarction: Secondary | ICD-10-CM | POA: Diagnosis not present

## 2024-05-06 DIAGNOSIS — F17210 Nicotine dependence, cigarettes, uncomplicated: Secondary | ICD-10-CM | POA: Diagnosis not present

## 2024-05-06 DIAGNOSIS — I2510 Atherosclerotic heart disease of native coronary artery without angina pectoris: Secondary | ICD-10-CM | POA: Diagnosis not present

## 2024-05-06 DIAGNOSIS — C3412 Malignant neoplasm of upper lobe, left bronchus or lung: Secondary | ICD-10-CM | POA: Diagnosis not present

## 2024-05-06 DIAGNOSIS — Z79633 Long term (current) use of mitotic inhibitor: Secondary | ICD-10-CM | POA: Diagnosis not present

## 2024-05-06 LAB — RAD ONC ARIA SESSION SUMMARY
Course Elapsed Days: 14
Plan Fractions Treated to Date: 11
Plan Prescribed Dose Per Fraction: 2 Gy
Plan Total Fractions Prescribed: 30
Plan Total Prescribed Dose: 60 Gy
Reference Point Dosage Given to Date: 22 Gy
Reference Point Session Dosage Given: 2 Gy
Session Number: 11

## 2024-05-06 LAB — CBC WITH DIFFERENTIAL (CANCER CENTER ONLY)
Abs Immature Granulocytes: 0.06 K/uL (ref 0.00–0.07)
Basophils Absolute: 0 K/uL (ref 0.0–0.1)
Basophils Relative: 0 %
Eosinophils Absolute: 0.1 K/uL (ref 0.0–0.5)
Eosinophils Relative: 2 %
HCT: 33.4 % — ABNORMAL LOW (ref 39.0–52.0)
Hemoglobin: 11.5 g/dL — ABNORMAL LOW (ref 13.0–17.0)
Immature Granulocytes: 1 %
Lymphocytes Relative: 22 %
Lymphs Abs: 1.3 K/uL (ref 0.7–4.0)
MCH: 33 pg (ref 26.0–34.0)
MCHC: 34.4 g/dL (ref 30.0–36.0)
MCV: 96 fL (ref 80.0–100.0)
Monocytes Absolute: 0.6 K/uL (ref 0.1–1.0)
Monocytes Relative: 10 %
Neutro Abs: 3.7 K/uL (ref 1.7–7.7)
Neutrophils Relative %: 65 %
Platelet Count: 109 K/uL — ABNORMAL LOW (ref 150–400)
RBC: 3.48 MIL/uL — ABNORMAL LOW (ref 4.22–5.81)
RDW: 14.5 % (ref 11.5–15.5)
WBC Count: 5.8 K/uL (ref 4.0–10.5)
nRBC: 0 % (ref 0.0–0.2)

## 2024-05-06 LAB — CMP (CANCER CENTER ONLY)
ALT: 10 U/L (ref 0–44)
AST: 12 U/L — ABNORMAL LOW (ref 15–41)
Albumin: 3.5 g/dL (ref 3.5–5.0)
Alkaline Phosphatase: 61 U/L (ref 38–126)
Anion gap: 4 — ABNORMAL LOW (ref 5–15)
BUN: 15 mg/dL (ref 8–23)
CO2: 30 mmol/L (ref 22–32)
Calcium: 8.4 mg/dL — ABNORMAL LOW (ref 8.9–10.3)
Chloride: 101 mmol/L (ref 98–111)
Creatinine: 0.68 mg/dL (ref 0.61–1.24)
GFR, Estimated: >60 mL/min (ref >60)
Glucose, Bld: 119 mg/dL — ABNORMAL HIGH (ref 70–99)
Potassium: 3.8 mmol/L (ref 3.5–5.1)
Sodium: 135 mmol/L (ref 135–145)
Total Bilirubin: 0.8 mg/dL (ref 0.0–1.2)
Total Protein: 6.7 g/dL (ref 6.5–8.1)

## 2024-05-06 MED ORDER — SODIUM CHLORIDE 0.9 % IV SOLN
45.0000 mg/m2 | Freq: Once | INTRAVENOUS | Status: AC
  Administered 2024-05-06: 78 mg via INTRAVENOUS
  Filled 2024-05-06: qty 13

## 2024-05-06 MED ORDER — DIPHENHYDRAMINE HCL 50 MG/ML IJ SOLN
50.0000 mg | Freq: Once | INTRAMUSCULAR | Status: AC
  Administered 2024-05-06: 50 mg via INTRAVENOUS
  Filled 2024-05-06: qty 1

## 2024-05-06 MED ORDER — SODIUM CHLORIDE 0.9 % IV SOLN: INTRAVENOUS | Status: DC

## 2024-05-06 MED ORDER — FAMOTIDINE IN NACL 20-0.9 MG/50ML-% IV SOLN
20.0000 mg | Freq: Once | INTRAVENOUS | Status: AC
  Administered 2024-05-06: 20 mg via INTRAVENOUS
  Filled 2024-05-06: qty 50

## 2024-05-06 MED ORDER — PALONOSETRON HCL INJECTION 0.25 MG/5ML
0.2500 mg | Freq: Once | INTRAVENOUS | Status: AC
  Administered 2024-05-06: 0.25 mg via INTRAVENOUS
  Filled 2024-05-06: qty 5

## 2024-05-06 MED ORDER — SODIUM CHLORIDE 0.9 % IV SOLN
156.4000 mg | Freq: Once | INTRAVENOUS | Status: AC
  Administered 2024-05-06: 160 mg via INTRAVENOUS
  Filled 2024-05-06: qty 16

## 2024-05-06 MED ORDER — DEXAMETHASONE SODIUM PHOSPHATE 10 MG/ML IJ SOLN
10.0000 mg | Freq: Once | INTRAMUSCULAR | Status: AC
  Administered 2024-05-06: 10 mg via INTRAVENOUS
  Filled 2024-05-06: qty 1

## 2024-05-06 NOTE — Patient Instructions (Signed)
 [facility] - A DEPT OF [facility]  Discharge Instructions: Thank you for choosing [facility] to provide your oncology and hematology care.   If you have a lab appointment with the Cancer Center, please go directly to the Cancer Center and check in at the registration area.   Wear comfortable clothing and clothing appropriate for easy access to any Portacath or PICC line.   We strive to give you quality time with your provider. You may need to reschedule your appointment if you arrive late (15 or more minutes).  Arriving late affects you and other patients whose appointments are after yours.  Also, if you miss three or more appointments without notifying the office, you may be dismissed from the clinic at the provider's discretion.      For prescription refill requests, have your pharmacy contact our office and allow 72 hours for refills to be completed.    Today you received the following chemotherapy and/or immunotherapy agents: Paclitaxel  (Taxol ) and Carboplatin    To help prevent nausea and vomiting after your treatment, we encourage you to take your nausea medication as directed.  BELOW ARE SYMPTOMS THAT SHOULD BE REPORTED IMMEDIATELY: *FEVER GREATER THAN 100.4 F (38 C) OR HIGHER *CHILLS OR SWEATING *NAUSEA AND VOMITING THAT IS NOT CONTROLLED WITH YOUR NAUSEA MEDICATION *UNUSUAL SHORTNESS OF BREATH *UNUSUAL BRUISING OR BLEEDING *URINARY PROBLEMS (pain or burning when urinating, or frequent urination) *BOWEL PROBLEMS (unusual diarrhea, constipation, pain near the anus) TENDERNESS IN MOUTH AND THROAT WITH OR WITHOUT PRESENCE OF ULCERS (sore throat, sores in mouth, or a toothache) UNUSUAL RASH, SWELLING OR PAIN  UNUSUAL VAGINAL DISCHARGE OR ITCHING   Items with * indicate a potential emergency and should be followed up as soon as possible or go to the Emergency Department if any problems should occur.  Please show the CHEMOTHERAPY ALERT  CARD or IMMUNOTHERAPY ALERT CARD at check-in to the Emergency Department and triage nurse.  Should you have questions after your visit or need to cancel or reschedule your appointment, please contact [facility] - [facility]  Dept: [phone]  and follow the prompts.  Office hours are 8:00 a.m. to 4:30 p.m. Monday - Friday. Please note that voicemails left after 4:00 p.m. may not be returned until the following business day.  We are closed weekends and major holidays. You have access to a nurse at all times for urgent questions. Please call the main number to the clinic Dept: [phone] and follow the prompts.   For any non-urgent questions, you may also contact your provider using MyChart. We now offer e-Visits for anyone 61 and older to request care online for non-urgent symptoms. For details visit [email or web address].   Also download the MyChart app! Go to the app store, search MyChart, open the app, select [place], and log in with your MyChart username and password.

## 2024-05-07 ENCOUNTER — Other Ambulatory Visit: Payer: Self-pay

## 2024-05-07 ENCOUNTER — Ambulatory Visit
Admission: RE | Admit: 2024-05-07 | Discharge: 2024-05-07 | Disposition: A | Discharge status: Home / Self Care | Source: Ambulatory Visit | Attending: Radiation Oncology | Admitting: Radiation Oncology

## 2024-05-07 DIAGNOSIS — Z5111 Encounter for antineoplastic chemotherapy: Secondary | ICD-10-CM | POA: Diagnosis not present

## 2024-05-07 DIAGNOSIS — J449 Chronic obstructive pulmonary disease, unspecified: Secondary | ICD-10-CM | POA: Diagnosis not present

## 2024-05-07 DIAGNOSIS — I252 Old myocardial infarction: Secondary | ICD-10-CM | POA: Diagnosis not present

## 2024-05-07 DIAGNOSIS — C3412 Malignant neoplasm of upper lobe, left bronchus or lung: Secondary | ICD-10-CM | POA: Diagnosis not present

## 2024-05-07 DIAGNOSIS — I2510 Atherosclerotic heart disease of native coronary artery without angina pectoris: Secondary | ICD-10-CM | POA: Diagnosis not present

## 2024-05-07 DIAGNOSIS — Z79630 Long term (current) use of alkylating agent: Secondary | ICD-10-CM | POA: Diagnosis not present

## 2024-05-07 DIAGNOSIS — Z79633 Long term (current) use of mitotic inhibitor: Secondary | ICD-10-CM | POA: Diagnosis not present

## 2024-05-07 DIAGNOSIS — Z510 Encounter for antineoplastic radiation therapy: Secondary | ICD-10-CM | POA: Diagnosis not present

## 2024-05-07 DIAGNOSIS — I10 Essential (primary) hypertension: Secondary | ICD-10-CM | POA: Diagnosis not present

## 2024-05-07 DIAGNOSIS — F17210 Nicotine dependence, cigarettes, uncomplicated: Secondary | ICD-10-CM | POA: Diagnosis not present

## 2024-05-07 LAB — RAD ONC ARIA SESSION SUMMARY
Course Elapsed Days: 15
Plan Fractions Treated to Date: 12
Plan Prescribed Dose Per Fraction: 2 Gy
Plan Total Fractions Prescribed: 30
Plan Total Prescribed Dose: 60 Gy
Reference Point Dosage Given to Date: 24 Gy
Reference Point Session Dosage Given: 2 Gy
Session Number: 12

## 2024-05-08 ENCOUNTER — Other Ambulatory Visit: Payer: Self-pay

## 2024-05-08 ENCOUNTER — Ambulatory Visit
Admission: RE | Admit: 2024-05-08 | Discharge: 2024-05-08 | Disposition: A | Discharge status: Home / Self Care | Source: Ambulatory Visit | Attending: Radiation Oncology | Admitting: Radiation Oncology

## 2024-05-08 DIAGNOSIS — Z902 Acquired absence of lung [part of]: Secondary | ICD-10-CM | POA: Insufficient documentation

## 2024-05-08 DIAGNOSIS — I10 Essential (primary) hypertension: Secondary | ICD-10-CM | POA: Insufficient documentation

## 2024-05-08 DIAGNOSIS — R590 Localized enlarged lymph nodes: Secondary | ICD-10-CM | POA: Diagnosis not present

## 2024-05-08 DIAGNOSIS — R630 Anorexia: Secondary | ICD-10-CM | POA: Diagnosis not present

## 2024-05-08 DIAGNOSIS — Z79630 Long term (current) use of alkylating agent: Secondary | ICD-10-CM | POA: Diagnosis not present

## 2024-05-08 DIAGNOSIS — K2100 Gastro-esophageal reflux disease with esophagitis, without bleeding: Secondary | ICD-10-CM | POA: Diagnosis not present

## 2024-05-08 DIAGNOSIS — R110 Nausea: Secondary | ICD-10-CM | POA: Diagnosis not present

## 2024-05-08 DIAGNOSIS — I252 Old myocardial infarction: Secondary | ICD-10-CM | POA: Diagnosis not present

## 2024-05-08 DIAGNOSIS — F17210 Nicotine dependence, cigarettes, uncomplicated: Secondary | ICD-10-CM | POA: Insufficient documentation

## 2024-05-08 DIAGNOSIS — Z510 Encounter for antineoplastic radiation therapy: Secondary | ICD-10-CM | POA: Diagnosis not present

## 2024-05-08 DIAGNOSIS — J449 Chronic obstructive pulmonary disease, unspecified: Secondary | ICD-10-CM | POA: Insufficient documentation

## 2024-05-08 DIAGNOSIS — Z9221 Personal history of antineoplastic chemotherapy: Secondary | ICD-10-CM | POA: Diagnosis not present

## 2024-05-08 DIAGNOSIS — R5383 Other fatigue: Secondary | ICD-10-CM | POA: Diagnosis not present

## 2024-05-08 DIAGNOSIS — Z79633 Long term (current) use of mitotic inhibitor: Secondary | ICD-10-CM | POA: Diagnosis not present

## 2024-05-08 DIAGNOSIS — R0609 Other forms of dyspnea: Secondary | ICD-10-CM | POA: Insufficient documentation

## 2024-05-08 DIAGNOSIS — K219 Gastro-esophageal reflux disease without esophagitis: Secondary | ICD-10-CM | POA: Diagnosis not present

## 2024-05-08 DIAGNOSIS — R634 Abnormal weight loss: Secondary | ICD-10-CM | POA: Diagnosis not present

## 2024-05-08 DIAGNOSIS — R1310 Dysphagia, unspecified: Secondary | ICD-10-CM | POA: Diagnosis not present

## 2024-05-08 DIAGNOSIS — I2510 Atherosclerotic heart disease of native coronary artery without angina pectoris: Secondary | ICD-10-CM | POA: Insufficient documentation

## 2024-05-08 DIAGNOSIS — C3412 Malignant neoplasm of upper lobe, left bronchus or lung: Secondary | ICD-10-CM | POA: Insufficient documentation

## 2024-05-08 DIAGNOSIS — Z5111 Encounter for antineoplastic chemotherapy: Secondary | ICD-10-CM | POA: Insufficient documentation

## 2024-05-08 DIAGNOSIS — Z7982 Long term (current) use of aspirin: Secondary | ICD-10-CM | POA: Diagnosis not present

## 2024-05-08 DIAGNOSIS — Z825 Family history of asthma and other chronic lower respiratory diseases: Secondary | ICD-10-CM | POA: Insufficient documentation

## 2024-05-08 DIAGNOSIS — Z79899 Other long term (current) drug therapy: Secondary | ICD-10-CM | POA: Diagnosis not present

## 2024-05-08 DIAGNOSIS — Z923 Personal history of irradiation: Secondary | ICD-10-CM | POA: Insufficient documentation

## 2024-05-08 DIAGNOSIS — Z8701 Personal history of pneumonia (recurrent): Secondary | ICD-10-CM | POA: Diagnosis not present

## 2024-05-08 DIAGNOSIS — Z8679 Personal history of other diseases of the circulatory system: Secondary | ICD-10-CM | POA: Diagnosis not present

## 2024-05-08 LAB — RAD ONC ARIA SESSION SUMMARY
Course Elapsed Days: 16
Plan Fractions Treated to Date: 13
Plan Prescribed Dose Per Fraction: 2 Gy
Plan Total Fractions Prescribed: 30
Plan Total Prescribed Dose: 60 Gy
Reference Point Dosage Given to Date: 26 Gy
Reference Point Session Dosage Given: 2 Gy
Session Number: 13

## 2024-05-09 ENCOUNTER — Ambulatory Visit
Admission: RE | Admit: 2024-05-09 | Discharge: 2024-05-09 | Disposition: A | Discharge status: Home / Self Care | Source: Ambulatory Visit | Attending: Radiation Oncology | Admitting: Radiation Oncology

## 2024-05-09 ENCOUNTER — Other Ambulatory Visit: Payer: Self-pay

## 2024-05-09 DIAGNOSIS — Z510 Encounter for antineoplastic radiation therapy: Secondary | ICD-10-CM | POA: Diagnosis not present

## 2024-05-09 DIAGNOSIS — C3412 Malignant neoplasm of upper lobe, left bronchus or lung: Secondary | ICD-10-CM | POA: Diagnosis not present

## 2024-05-09 DIAGNOSIS — I2510 Atherosclerotic heart disease of native coronary artery without angina pectoris: Secondary | ICD-10-CM | POA: Diagnosis not present

## 2024-05-09 DIAGNOSIS — F17210 Nicotine dependence, cigarettes, uncomplicated: Secondary | ICD-10-CM | POA: Diagnosis not present

## 2024-05-09 DIAGNOSIS — Z79630 Long term (current) use of alkylating agent: Secondary | ICD-10-CM | POA: Diagnosis not present

## 2024-05-09 DIAGNOSIS — Z79633 Long term (current) use of mitotic inhibitor: Secondary | ICD-10-CM | POA: Diagnosis not present

## 2024-05-09 DIAGNOSIS — Z5111 Encounter for antineoplastic chemotherapy: Secondary | ICD-10-CM | POA: Diagnosis not present

## 2024-05-09 DIAGNOSIS — I252 Old myocardial infarction: Secondary | ICD-10-CM | POA: Diagnosis not present

## 2024-05-09 DIAGNOSIS — I10 Essential (primary) hypertension: Secondary | ICD-10-CM | POA: Diagnosis not present

## 2024-05-09 LAB — RAD ONC ARIA SESSION SUMMARY
Course Elapsed Days: 17
Plan Fractions Treated to Date: 14
Plan Prescribed Dose Per Fraction: 2 Gy
Plan Total Fractions Prescribed: 30
Plan Total Prescribed Dose: 60 Gy
Reference Point Dosage Given to Date: 28 Gy
Reference Point Session Dosage Given: 2 Gy
Session Number: 14

## 2024-05-10 ENCOUNTER — Other Ambulatory Visit: Payer: Self-pay | Admitting: Radiation Oncology

## 2024-05-10 ENCOUNTER — Ambulatory Visit
Admission: RE | Admit: 2024-05-10 | Discharge: 2024-05-10 | Disposition: A | Discharge status: Home / Self Care | Source: Ambulatory Visit | Attending: Radiation Oncology | Admitting: Radiation Oncology

## 2024-05-10 ENCOUNTER — Other Ambulatory Visit: Payer: Self-pay

## 2024-05-10 DIAGNOSIS — Z79633 Long term (current) use of mitotic inhibitor: Secondary | ICD-10-CM | POA: Diagnosis not present

## 2024-05-10 DIAGNOSIS — Z510 Encounter for antineoplastic radiation therapy: Secondary | ICD-10-CM | POA: Diagnosis not present

## 2024-05-10 DIAGNOSIS — C3412 Malignant neoplasm of upper lobe, left bronchus or lung: Secondary | ICD-10-CM | POA: Diagnosis not present

## 2024-05-10 DIAGNOSIS — F17210 Nicotine dependence, cigarettes, uncomplicated: Secondary | ICD-10-CM | POA: Diagnosis not present

## 2024-05-10 LAB — RAD ONC ARIA SESSION SUMMARY
Course Elapsed Days: 18
Plan Fractions Treated to Date: 15
Plan Prescribed Dose Per Fraction: 2 Gy
Plan Total Fractions Prescribed: 30
Plan Total Prescribed Dose: 60 Gy
Reference Point Dosage Given to Date: 30 Gy
Reference Point Session Dosage Given: 2 Gy
Session Number: 15

## 2024-05-10 MED ORDER — SUCRALFATE 1 G PO TABS: 1.0000 g | ORAL_TABLET | Freq: Three times a day (TID) | ORAL | 2 refills | Status: AC

## 2024-05-13 ENCOUNTER — Inpatient Hospital Stay: Admitting: Internal Medicine

## 2024-05-13 ENCOUNTER — Ambulatory Visit
Admission: RE | Admit: 2024-05-13 | Discharge: 2024-05-13 | Disposition: A | Source: Ambulatory Visit | Attending: Radiation Oncology

## 2024-05-13 ENCOUNTER — Inpatient Hospital Stay

## 2024-05-13 ENCOUNTER — Other Ambulatory Visit

## 2024-05-13 ENCOUNTER — Other Ambulatory Visit: Payer: Self-pay

## 2024-05-13 VITALS — BP 132/80 | HR 68 | Temp 98.0°F | Resp 17 | Ht 67.0 in | Wt 132.2 lb

## 2024-05-13 DIAGNOSIS — Z8701 Personal history of pneumonia (recurrent): Secondary | ICD-10-CM | POA: Insufficient documentation

## 2024-05-13 DIAGNOSIS — J449 Chronic obstructive pulmonary disease, unspecified: Secondary | ICD-10-CM | POA: Insufficient documentation

## 2024-05-13 DIAGNOSIS — R197 Diarrhea, unspecified: Secondary | ICD-10-CM | POA: Insufficient documentation

## 2024-05-13 DIAGNOSIS — Z8679 Personal history of other diseases of the circulatory system: Secondary | ICD-10-CM | POA: Insufficient documentation

## 2024-05-13 DIAGNOSIS — G8929 Other chronic pain: Secondary | ICD-10-CM | POA: Insufficient documentation

## 2024-05-13 DIAGNOSIS — C3492 Malignant neoplasm of unspecified part of left bronchus or lung: Secondary | ICD-10-CM

## 2024-05-13 DIAGNOSIS — Z5111 Encounter for antineoplastic chemotherapy: Secondary | ICD-10-CM | POA: Insufficient documentation

## 2024-05-13 DIAGNOSIS — R1310 Dysphagia, unspecified: Secondary | ICD-10-CM | POA: Insufficient documentation

## 2024-05-13 DIAGNOSIS — R530 Neoplastic (malignant) related fatigue: Secondary | ICD-10-CM | POA: Insufficient documentation

## 2024-05-13 DIAGNOSIS — R0609 Other forms of dyspnea: Secondary | ICD-10-CM | POA: Insufficient documentation

## 2024-05-13 DIAGNOSIS — Z7982 Long term (current) use of aspirin: Secondary | ICD-10-CM | POA: Insufficient documentation

## 2024-05-13 DIAGNOSIS — F17210 Nicotine dependence, cigarettes, uncomplicated: Secondary | ICD-10-CM | POA: Diagnosis not present

## 2024-05-13 DIAGNOSIS — Z79899 Other long term (current) drug therapy: Secondary | ICD-10-CM | POA: Insufficient documentation

## 2024-05-13 DIAGNOSIS — K208 Other esophagitis without bleeding: Secondary | ICD-10-CM | POA: Insufficient documentation

## 2024-05-13 DIAGNOSIS — R112 Nausea with vomiting, unspecified: Secondary | ICD-10-CM | POA: Insufficient documentation

## 2024-05-13 DIAGNOSIS — I2510 Atherosclerotic heart disease of native coronary artery without angina pectoris: Secondary | ICD-10-CM | POA: Insufficient documentation

## 2024-05-13 DIAGNOSIS — R591 Generalized enlarged lymph nodes: Secondary | ICD-10-CM | POA: Insufficient documentation

## 2024-05-13 DIAGNOSIS — Y842 Radiological procedure and radiotherapy as the cause of abnormal reaction of the patient, or of later complication, without mention of misadventure at the time of the procedure: Secondary | ICD-10-CM | POA: Insufficient documentation

## 2024-05-13 DIAGNOSIS — Z902 Acquired absence of lung [part of]: Secondary | ICD-10-CM | POA: Insufficient documentation

## 2024-05-13 DIAGNOSIS — T451X5A Adverse effect of antineoplastic and immunosuppressive drugs, initial encounter: Secondary | ICD-10-CM | POA: Insufficient documentation

## 2024-05-13 DIAGNOSIS — I252 Old myocardial infarction: Secondary | ICD-10-CM | POA: Insufficient documentation

## 2024-05-13 DIAGNOSIS — Z79633 Long term (current) use of mitotic inhibitor: Secondary | ICD-10-CM | POA: Insufficient documentation

## 2024-05-13 DIAGNOSIS — D6959 Other secondary thrombocytopenia: Secondary | ICD-10-CM | POA: Insufficient documentation

## 2024-05-13 DIAGNOSIS — C3412 Malignant neoplasm of upper lobe, left bronchus or lung: Secondary | ICD-10-CM | POA: Diagnosis not present

## 2024-05-13 DIAGNOSIS — Z79630 Long term (current) use of alkylating agent: Secondary | ICD-10-CM | POA: Insufficient documentation

## 2024-05-13 DIAGNOSIS — Z923 Personal history of irradiation: Secondary | ICD-10-CM | POA: Insufficient documentation

## 2024-05-13 DIAGNOSIS — I10 Essential (primary) hypertension: Secondary | ICD-10-CM | POA: Insufficient documentation

## 2024-05-13 DIAGNOSIS — Z510 Encounter for antineoplastic radiation therapy: Secondary | ICD-10-CM | POA: Diagnosis not present

## 2024-05-13 LAB — RAD ONC ARIA SESSION SUMMARY
Course Elapsed Days: 21
Plan Fractions Treated to Date: 16
Plan Prescribed Dose Per Fraction: 2 Gy
Plan Total Fractions Prescribed: 30
Plan Total Prescribed Dose: 60 Gy
Reference Point Dosage Given to Date: 32 Gy
Reference Point Session Dosage Given: 2 Gy
Session Number: 16

## 2024-05-13 LAB — CBC WITH DIFFERENTIAL (CANCER CENTER ONLY)
Abs Immature Granulocytes: 0.07 K/uL (ref 0.00–0.07)
Basophils Absolute: 0.1 K/uL (ref 0.0–0.1)
Basophils Relative: 1 %
Eosinophils Absolute: 0.1 K/uL (ref 0.0–0.5)
Eosinophils Relative: 2 %
HCT: 33.6 % — ABNORMAL LOW (ref 39.0–52.0)
Hemoglobin: 11.8 g/dL — ABNORMAL LOW (ref 13.0–17.0)
Immature Granulocytes: 2 %
Lymphocytes Relative: 20 %
Lymphs Abs: 0.9 K/uL (ref 0.7–4.0)
MCH: 33.5 pg (ref 26.0–34.0)
MCHC: 35.1 g/dL (ref 30.0–36.0)
MCV: 95.5 fL (ref 80.0–100.0)
Monocytes Absolute: 0.5 K/uL (ref 0.1–1.0)
Monocytes Relative: 10 %
Neutro Abs: 3 K/uL (ref 1.7–7.7)
Neutrophils Relative %: 65 %
Platelet Count: 129 K/uL — ABNORMAL LOW (ref 150–400)
RBC: 3.52 MIL/uL — ABNORMAL LOW (ref 4.22–5.81)
RDW: 14.4 % (ref 11.5–15.5)
WBC Count: 4.6 K/uL (ref 4.0–10.5)
nRBC: 0 % (ref 0.0–0.2)

## 2024-05-13 LAB — CMP (CANCER CENTER ONLY)
ALT: 11 U/L (ref 0–44)
AST: 13 U/L — ABNORMAL LOW (ref 15–41)
Albumin: 3.6 g/dL (ref 3.5–5.0)
Alkaline Phosphatase: 62 U/L (ref 38–126)
Anion gap: 4 — ABNORMAL LOW (ref 5–15)
BUN: 10 mg/dL (ref 8–23)
CO2: 30 mmol/L (ref 22–32)
Calcium: 9.2 mg/dL (ref 8.9–10.3)
Chloride: 100 mmol/L (ref 98–111)
Creatinine: 0.69 mg/dL (ref 0.61–1.24)
GFR, Estimated: >60 mL/min (ref >60)
Glucose, Bld: 113 mg/dL — ABNORMAL HIGH (ref 70–99)
Potassium: 4.2 mmol/L (ref 3.5–5.1)
Sodium: 134 mmol/L — ABNORMAL LOW (ref 135–145)
Total Bilirubin: 0.7 mg/dL (ref 0.0–1.2)
Total Protein: 6.8 g/dL (ref 6.5–8.1)

## 2024-05-13 NOTE — Progress Notes (Signed)
 [facility] [phone]   [phone]  OFFICE PROGRESS NOTE  Stephen Manna, MD [address] [place] [address]  DIAGNOSIS: Stage IIIa (T1b, N2, M0) non-small cell lung cancer, squamous cell carcinoma presented with left upper lobe lung nodule in addition to left hilar and mediastinal lymphadenopathy. This was diagnosed in August 2025.    PRIOR THERAPY: The patient also has a history of stage IIb (T3, N0, M0) non-small cell lung cancer, adenocarcinoma status post right upper lobectomy with lymph node dissection at [facility] on May 17, 2012 with a tumor size of 6.5 cm. He was also treated with adjuvant chemotherapy at that time.   CURRENT THERAPY: A course of concurrent chemoradiation with weekly carboplatin  for AUC of 2 and paclitaxel  45 mg/M2 status post 3 cycles.  INTERVAL HISTORY: Stephen Leon 78 y.o. male returns to the clinic today for follow-up visit accompanied by his wife.Discussed the use of AI scribe software for clinical note transcription with the patient, who gave verbal consent to proceed.  History of Present Illness Stephen Leon is a 78 year old male with stage IIIA non-small cell lung cancer who presents for evaluation before starting the fourth cycle of chemotherapy. He is accompanied by his wife.  He was diagnosed with stage IIIA non-small cell lung cancer, squamous cell carcinoma, in August 2025. He is currently undergoing a course of chemoradiation with weekly carboplatin  and paclitaxel  and has completed three cycles.  He experiences significant nausea and a sensation of wanting to vomit the day after chemotherapy, with persistent dry heaves but no actual vomiting. He has been taking Zofran  for nausea, but it is ineffective. He also takes Decadron , a steroid, twice daily after chemotherapy, but it does not alleviate his symptoms.  He describes  coughing up a lot of white phlegm, feeling like he wants to be sick, and experiencing burping. He has been prescribed Carafate to help with esophageal symptoms, which he has started taking, but he reports feeling bad after the first dose.  He has a history of degenerative disc disease, which is causing pain due to compression fractures in his back. This pain is unrelated to his cancer or chemotherapy.    MEDICAL HISTORY: Past Medical History:  Diagnosis Date   Anxiety    Bronchitis    Cancer (HCC) 2016   lung   COPD (chronic obstructive pulmonary disease) (HCC)    Coronary artery disease 09/26/2023   Cough 07/13/2007   Qualifier: Diagnosis of  By: Georgian ROSALEA CHARM Lamar    Dyspnea    due to copd - w/exertion   GERD (gastroesophageal reflux disease)    Hypertension    pt denies this and is not on bp meds but vascular note states htn   LUNG NODULE 07/13/2007   Qualifier: Diagnosis of  By: Georgian ROSALEA CHARM Lamar    Myocardial infarction [facility]) 2023   mild per patient   Nodule of left lung 03/2024   upper lobe   Pneumonia 2013   Thoracic aortic aneurysm    followed by vascular    ALLERGIES:  has no known allergies.  MEDICATIONS:  Current Outpatient Medications  Medication Sig Dispense Refill   sucralfate (CARAFATE) 1 g tablet Take 1 tablet (1 g total) by mouth 4 (four) times daily -  with meals and at bedtime. 5 min before meals for radiation induced esophagitis 120 tablet 2   albuterol  (VENTOLIN  HFA)  108 (90 Base) MCG/ACT inhaler Inhale 2 puffs into the lungs every 6 (six) hours as needed.     aspirin  EC 81 MG tablet Take 81 mg by mouth daily. Swallow whole.     budesonide-glycopyrrolate -formoterol (BREZTRI  AEROSPHERE) 160-9-4.8 MCG/ACT AERO inhaler Inhale 2 puffs into the lungs in the morning and at bedtime. 11.8 g 0   budesonide-glycopyrrolate -formoterol (BREZTRI  AEROSPHERE) 160-9-4.8 MCG/ACT AERO inhaler Inhale 2 puffs into the lungs in the morning and at bedtime. (Patient not taking:  Reported on 04/29/2024)     chlorpheniramine-HYDROcodone  (TUSSIONEX) 10-8 MG/5ML Take 5 mLs by mouth at bedtime as needed. 115 mL 0   dexamethasone  (DECADRON ) 4 MG tablet Take 2 tablets daily for 2 days, start the day after chemotherapy. Take with food. 30 tablet 1   gabapentin  (NEURONTIN ) 800 MG tablet Take 800 mg by mouth 2 (two) times daily.      HYDROcodone -acetaminophen  (NORCO/VICODIN) 5-325 MG tablet Take 1 tablet by mouth every 12 (twelve) hours as needed for moderate pain (pain score 4-6). 30 tablet 0   ibuprofen  (ADVIL ) 800 MG tablet Take 800 mg by mouth every 8 (eight) hours as needed for mild pain.     ipratropium-albuterol  (DUONEB) 0.5-2.5 (3) MG/3ML SOLN Inhale 3 mLs into the lungs every 4 (four) hours as needed (When he get sick and the doctor tells him to take it).     lidocaine -prilocaine  (EMLA ) cream Apply to affected area once 30 g 3   omeprazole (PRILOSEC) 40 MG capsule Take 40 mg by mouth daily.     ondansetron  (ZOFRAN ) 8 MG tablet Take 1 tablet (8 mg total) by mouth every 8 (eight) hours as needed for nausea or vomiting. Start on the third day after chemotherapy. 30 tablet 1   prochlorperazine  (COMPAZINE ) 10 MG tablet Take 1 tablet (10 mg total) by mouth every 6 (six) hours as needed for nausea or vomiting. 30 tablet 1   tamsulosin (FLOMAX) 0.4 MG CAPS capsule Take 0.4 mg by mouth at bedtime.     No current facility-administered medications for this visit.    SURGICAL HISTORY:  Past Surgical History:  Procedure Laterality Date   BIOPSY  07/13/2023   Procedure: BIOPSY;  Surgeon: Onita Elspeth Sharper, DO;  Location: [facility] ENDOSCOPY;  Service: Gastroenterology;;   BRONCHIAL BRUSHINGS  03/26/2024   Procedure: BRONCHOSCOPY, WITH BRUSH BIOPSY;  Surgeon: [name], MD;  Location: [facility] ENDOSCOPY;  Service: Pulmonary;;   BRONCHIAL NEEDLE ASPIRATION BIOPSY  03/26/2024   Procedure: BRONCHOSCOPY, WITH NEEDLE ASPIRATION BIOPSY;  Surgeon: [name], MD;  Location: [facility] ENDOSCOPY;   Service: Pulmonary;;   COLONOSCOPY N/A 10/27/2022   hx polyps; Procedure: COLONOSCOPY;  Surgeon: Onita Elspeth Sharper, DO;  Location: [facility] ENDOSCOPY;  Service: Gastroenterology;  Laterality: N/A;   COLONOSCOPY WITH PROPOFOL  N/A 07/13/2023   Procedure: COLONOSCOPY WITH PROPOFOL ;  Surgeon: Onita Elspeth Sharper, DO;  Location: [facility] ENDOSCOPY;  Service: Gastroenterology;  Laterality: N/A;   CORONARY PRESSURE/FFR STUDY N/A 05/19/2020   Procedure: INTRAVASCULAR PRESSURE WIRE/FFR STUDY;  Surgeon: Ammon Blunt, MD;  Location: [facility] INVASIVE CV LAB;  Service: Cardiovascular;  Laterality: N/A;   DIAGNOSTIC LAPAROSCOPY     ENDOBRONCHIAL ULTRASOUND Left 03/26/2024   Procedure: ENDOBRONCHIAL ULTRASOUND (EBUS);  Surgeon: [name], MD;  Location: [facility] ENDOSCOPY;  Service: Pulmonary;  Laterality: Left;   ESOPHAGOGASTRODUODENOSCOPY N/A 10/27/2022   Procedure: ESOPHAGOGASTRODUODENOSCOPY (EGD);  Surgeon: Onita Elspeth Sharper, DO;  Location: [facility] ENDOSCOPY;  Service: Gastroenterology;  Laterality: N/A;   EYE SURGERY Bilateral    HERNIA  REPAIR     KYPHOPLASTY N/A 12/13/2018   Procedure: KYPHOPLASTY- T7;  Surgeon: Kathlynn Sharper, MD;  Location: [facility] ORS;  Service: Orthopedics;  Laterality: N/A;   LEFT HEART CATH N/A 05/11/2022   Procedure: Left Heart Cath;  Surgeon: Ammon Blunt, MD;  Location: [facility] INVASIVE CV LAB;  Service: Cardiovascular;  Laterality: N/A;   LEFT HEART CATH AND CORONARY ANGIOGRAPHY Left 05/19/2020   Procedure: LEFT HEART CATH AND CORONARY ANGIOGRAPHY;  Surgeon: Ammon Blunt, MD;  Location: [facility] INVASIVE CV LAB;  Service: Cardiovascular;  Laterality: Left;   Robotic assisted Right upper lobectomy Right 05/17/2012   [facility]   VIDEO BRONCHOSCOPY WITH ENDOBRONCHIAL NAVIGATION Left 03/26/2024   Procedure: VIDEO BRONCHOSCOPY WITH ENDOBRONCHIAL NAVIGATION;  Surgeon: [name], MD;  Location: [facility] ENDOSCOPY;  Service: Pulmonary;  Laterality:  Left;   XI ROBOTIC ASSISTED INGUINAL HERNIA REPAIR WITH MESH Right 07/19/2019   Procedure: XI ROBOTIC ASSISTED INGUINAL HERNIA REPAIR WITH MESH;  Surgeon: Tye Millet, DO;  Location: [facility] ORS;  Service: General;  Laterality: Right;    REVIEW OF SYSTEMS:  Constitutional: positive for fatigue Eyes: negative Ears, nose, mouth, throat, and face: negative Respiratory: positive for cough Cardiovascular: negative Gastrointestinal: positive for nausea and odynophagia Genitourinary:negative Integument/breast: negative Hematologic/lymphatic: negative Musculoskeletal:positive for back pain Neurological: negative Behavioral/Psych: negative Endocrine: negative Allergic/Immunologic: negative   PHYSICAL EXAMINATION: General appearance: alert, cooperative, fatigued, and no distress Head: Normocephalic, without obvious abnormality, atraumatic Neck: no adenopathy, no JVD, supple, symmetrical, trachea midline, and thyroid  not enlarged, symmetric, no tenderness/mass/nodules Lymph nodes: Cervical, supraclavicular, and axillary nodes normal. Resp: clear to auscultation bilaterally Back: symmetric, no curvature. ROM normal. No CVA tenderness. Cardio: regular rate and rhythm, S1, S2 normal, no murmur, click, rub or gallop GI: soft, non-tender; bowel sounds normal; no masses,  no organomegaly Extremities: extremities normal, atraumatic, no cyanosis or edema Neurologic: Alert and oriented X 3, normal strength and tone. Normal symmetric reflexes. Normal coordination and gait  ECOG PERFORMANCE STATUS: 1 - Symptomatic but completely ambulatory  Blood pressure 132/80, pulse 68, temperature 98 F (36.7 C), resp. rate 17, height 5' 7 (1.702 m), weight 132 lb 3.2 oz (60 kg), SpO2 97%.  LABORATORY DATA: Lab Results  Component Value Date   WBC 4.6 05/13/2024   HGB 11.8 (L) 05/13/2024   HCT 33.6 (L) 05/13/2024   MCV 95.5 05/13/2024   PLT 129 (L) 05/13/2024      Chemistry      Component Value Date/Time    NA 134 (L) 05/13/2024 0958   K 4.2 05/13/2024 0958   CL 100 05/13/2024 0958   CO2 30 05/13/2024 0958   BUN 10 05/13/2024 0958   CREATININE 0.69 05/13/2024 0958      Component Value Date/Time   CALCIUM  9.2 05/13/2024 0958   ALKPHOS 62 05/13/2024 0958   AST 13 (L) 05/13/2024 0958   ALT 11 05/13/2024 0958   BILITOT 0.7 05/13/2024 0958       RADIOGRAPHIC STUDIES: No results found.   ASSESSMENT AND PLAN: This is a very pleasant 78 years old white male diagnosed with stage IIIa (T1b, N2, M0) non-small cell lung cancer, squamous cell carcinoma presented with left upper lobe lung nodule in addition to left hilar and mediastinal lymphadenopathy. This was diagnosed in August 2025. The patient also has a history of stage IIb (T3, N0, M0) non-small cell lung cancer, adenocarcinoma status post right upper lobectomy with lymph node dissection at [facility] on May 17, 2012  with a tumor size of 6.5 cm. He was also treated with adjuvant chemotherapy at that time.  The patient is currently undergoing a course of concurrent chemoradiation with weekly carboplatin  for AUC of 2 and paclitaxel  45 mg/M2.  Status post 3 cycles.  He has been complaining of fatigue as well as odynophagia and nausea.  He also has chronic back pain. Assessment and Plan Assessment & Plan Stage IIIA non-small cell lung cancer, squamous cell carcinoma Diagnosed in August 2025, currently undergoing chemoradiation with weekly carboplatin  and paclitaxel . Experiencing significant nausea and discomfort post-chemotherapy, questioning continuation due to side effects. Chemotherapy is a mild dose, and radiation near the esophagus may cause more symptoms due to radiation-induced esophagitis. - Skip chemotherapy this week. - Continue radiation therapy. - Reassess symptoms next week to determine if chemotherapy should be resumed.  Radiation-induced esophagitis Causing inflammation in the esophagus, likely  contributing to symptoms of nausea and discomfort, including coughing and burping, possibly exacerbated by radiation near the esophagus. - Continue Carafate, taking it half an hour before meals and at bedtime, dissolved in water.  Chemotherapy-induced nausea and vomiting Not adequately controlled by current antiemetic regimen. Steroids previously used for nausea management but discontinued due to ineffectiveness and patient discomfort. - Discontinue Decadron  post-chemotherapy. - Evaluate effectiveness of current antiemetic regimen and consider alternatives if symptoms persist. He was advised to call immediately if he has any concerning symptoms in the interval. The patient voices understanding of current disease status and treatment options and is in agreement with the current care plan.  All questions were answered. The patient knows to call the clinic with any problems, questions or concerns. We can certainly see the patient much sooner if necessary.  The total time spent in the appointment was 30 minutes including review of chart and various tests results, discussions about plan of care and coordination of care plan .   Disclaimer: This note was dictated with voice recognition software. Similar sounding words can inadvertently be transcribed and may not be corrected upon review.

## 2024-05-14 ENCOUNTER — Other Ambulatory Visit: Payer: Self-pay

## 2024-05-14 ENCOUNTER — Ambulatory Visit
Admission: RE | Admit: 2024-05-14 | Discharge: 2024-05-14 | Disposition: A | Source: Ambulatory Visit | Attending: Radiation Oncology

## 2024-05-14 DIAGNOSIS — Z510 Encounter for antineoplastic radiation therapy: Secondary | ICD-10-CM | POA: Diagnosis not present

## 2024-05-14 DIAGNOSIS — F17210 Nicotine dependence, cigarettes, uncomplicated: Secondary | ICD-10-CM | POA: Diagnosis not present

## 2024-05-14 DIAGNOSIS — C3412 Malignant neoplasm of upper lobe, left bronchus or lung: Secondary | ICD-10-CM | POA: Diagnosis not present

## 2024-05-14 LAB — RAD ONC ARIA SESSION SUMMARY
Course Elapsed Days: 22
Plan Fractions Treated to Date: 17
Plan Prescribed Dose Per Fraction: 2 Gy
Plan Total Fractions Prescribed: 30
Plan Total Prescribed Dose: 60 Gy
Reference Point Dosage Given to Date: 34 Gy
Reference Point Session Dosage Given: 2 Gy
Session Number: 17

## 2024-05-15 ENCOUNTER — Ambulatory Visit
Admission: RE | Admit: 2024-05-15 | Discharge: 2024-05-15 | Disposition: A | Source: Ambulatory Visit | Attending: Radiation Oncology

## 2024-05-15 ENCOUNTER — Other Ambulatory Visit: Payer: Self-pay

## 2024-05-15 DIAGNOSIS — I252 Old myocardial infarction: Secondary | ICD-10-CM | POA: Diagnosis not present

## 2024-05-15 DIAGNOSIS — I2510 Atherosclerotic heart disease of native coronary artery without angina pectoris: Secondary | ICD-10-CM | POA: Diagnosis not present

## 2024-05-15 DIAGNOSIS — Z79633 Long term (current) use of mitotic inhibitor: Secondary | ICD-10-CM | POA: Diagnosis not present

## 2024-05-15 DIAGNOSIS — Z79630 Long term (current) use of alkylating agent: Secondary | ICD-10-CM | POA: Diagnosis not present

## 2024-05-15 DIAGNOSIS — C3412 Malignant neoplasm of upper lobe, left bronchus or lung: Secondary | ICD-10-CM | POA: Diagnosis not present

## 2024-05-15 DIAGNOSIS — Z5111 Encounter for antineoplastic chemotherapy: Secondary | ICD-10-CM | POA: Diagnosis not present

## 2024-05-15 DIAGNOSIS — Z510 Encounter for antineoplastic radiation therapy: Secondary | ICD-10-CM | POA: Diagnosis not present

## 2024-05-15 DIAGNOSIS — I10 Essential (primary) hypertension: Secondary | ICD-10-CM | POA: Diagnosis not present

## 2024-05-15 DIAGNOSIS — F17210 Nicotine dependence, cigarettes, uncomplicated: Secondary | ICD-10-CM | POA: Diagnosis not present

## 2024-05-15 LAB — RAD ONC ARIA SESSION SUMMARY
Course Elapsed Days: 23
Plan Fractions Treated to Date: 18
Plan Prescribed Dose Per Fraction: 2 Gy
Plan Total Fractions Prescribed: 30
Plan Total Prescribed Dose: 60 Gy
Reference Point Dosage Given to Date: 36 Gy
Reference Point Session Dosage Given: 2 Gy
Session Number: 18

## 2024-05-16 ENCOUNTER — Other Ambulatory Visit: Payer: Self-pay

## 2024-05-16 ENCOUNTER — Ambulatory Visit
Admission: RE | Admit: 2024-05-16 | Discharge: 2024-05-16 | Disposition: A | Discharge status: Home / Self Care | Source: Ambulatory Visit | Attending: Radiation Oncology | Admitting: Radiation Oncology

## 2024-05-16 DIAGNOSIS — J449 Chronic obstructive pulmonary disease, unspecified: Secondary | ICD-10-CM | POA: Diagnosis not present

## 2024-05-16 DIAGNOSIS — I10 Essential (primary) hypertension: Secondary | ICD-10-CM | POA: Diagnosis not present

## 2024-05-16 DIAGNOSIS — I2510 Atherosclerotic heart disease of native coronary artery without angina pectoris: Secondary | ICD-10-CM | POA: Diagnosis not present

## 2024-05-16 DIAGNOSIS — Z5111 Encounter for antineoplastic chemotherapy: Secondary | ICD-10-CM | POA: Diagnosis not present

## 2024-05-16 DIAGNOSIS — C3412 Malignant neoplasm of upper lobe, left bronchus or lung: Secondary | ICD-10-CM | POA: Diagnosis not present

## 2024-05-16 DIAGNOSIS — F17210 Nicotine dependence, cigarettes, uncomplicated: Secondary | ICD-10-CM | POA: Diagnosis not present

## 2024-05-16 DIAGNOSIS — Z79633 Long term (current) use of mitotic inhibitor: Secondary | ICD-10-CM | POA: Diagnosis not present

## 2024-05-16 DIAGNOSIS — Z510 Encounter for antineoplastic radiation therapy: Secondary | ICD-10-CM | POA: Diagnosis not present

## 2024-05-16 LAB — RAD ONC ARIA SESSION SUMMARY
Course Elapsed Days: 24
Plan Fractions Treated to Date: 19
Plan Prescribed Dose Per Fraction: 2 Gy
Plan Total Fractions Prescribed: 30
Plan Total Prescribed Dose: 60 Gy
Reference Point Dosage Given to Date: 38 Gy
Reference Point Session Dosage Given: 2 Gy
Session Number: 19

## 2024-05-17 ENCOUNTER — Other Ambulatory Visit: Payer: Self-pay

## 2024-05-17 ENCOUNTER — Other Ambulatory Visit: Payer: Self-pay | Admitting: Radiology

## 2024-05-17 ENCOUNTER — Ambulatory Visit
Admission: RE | Admit: 2024-05-17 | Discharge: 2024-05-17 | Disposition: A | Discharge status: Home / Self Care | Source: Ambulatory Visit | Attending: Radiation Oncology | Admitting: Radiation Oncology

## 2024-05-17 DIAGNOSIS — I10 Essential (primary) hypertension: Secondary | ICD-10-CM | POA: Diagnosis not present

## 2024-05-17 DIAGNOSIS — Z510 Encounter for antineoplastic radiation therapy: Secondary | ICD-10-CM | POA: Diagnosis not present

## 2024-05-17 LAB — RAD ONC ARIA SESSION SUMMARY
Course Elapsed Days: 25
Plan Fractions Treated to Date: 20
Plan Prescribed Dose Per Fraction: 2 Gy
Plan Total Fractions Prescribed: 30
Plan Total Prescribed Dose: 60 Gy
Reference Point Dosage Given to Date: 40 Gy
Reference Point Session Dosage Given: 2 Gy
Session Number: 20

## 2024-05-17 MED ORDER — HYDROCOD POLI-CHLORPHE POLI ER 10-8 MG/5ML PO SUER: 5.0000 mL | Freq: Every evening | ORAL | 0 refills | Status: DC | PRN

## 2024-05-20 ENCOUNTER — Encounter: Payer: Self-pay | Admitting: Internal Medicine

## 2024-05-20 ENCOUNTER — Other Ambulatory Visit: Payer: Self-pay

## 2024-05-20 ENCOUNTER — Ambulatory Visit
Admission: RE | Admit: 2024-05-20 | Discharge: 2024-05-20 | Disposition: A | Source: Ambulatory Visit | Attending: Radiation Oncology

## 2024-05-20 ENCOUNTER — Inpatient Hospital Stay

## 2024-05-20 ENCOUNTER — Other Ambulatory Visit

## 2024-05-20 VITALS — BP 111/71 | HR 63 | Temp 98.0°F | Resp 20 | Wt 134.2 lb

## 2024-05-20 DIAGNOSIS — Z510 Encounter for antineoplastic radiation therapy: Secondary | ICD-10-CM | POA: Diagnosis not present

## 2024-05-20 DIAGNOSIS — C3492 Malignant neoplasm of unspecified part of left bronchus or lung: Secondary | ICD-10-CM

## 2024-05-20 LAB — CBC WITH DIFFERENTIAL (CANCER CENTER ONLY)
Abs Immature Granulocytes: 0.1 K/uL — ABNORMAL HIGH (ref 0.00–0.07)
Basophils Absolute: 0.1 K/uL (ref 0.0–0.1)
Basophils Relative: 2 %
Eosinophils Absolute: 0 K/uL (ref 0.0–0.5)
Eosinophils Relative: 1 %
HCT: 34.3 % — ABNORMAL LOW (ref 39.0–52.0)
Hemoglobin: 11.9 g/dL — ABNORMAL LOW (ref 13.0–17.0)
Immature Granulocytes: 2 %
Lymphocytes Relative: 15 %
Lymphs Abs: 0.7 K/uL (ref 0.7–4.0)
MCH: 33.6 pg (ref 26.0–34.0)
MCHC: 34.7 g/dL (ref 30.0–36.0)
MCV: 96.9 fL (ref 80.0–100.0)
Monocytes Absolute: 0.8 K/uL (ref 0.1–1.0)
Monocytes Relative: 16 %
Neutro Abs: 3.3 K/uL (ref 1.7–7.7)
Neutrophils Relative %: 64 %
Platelet Count: 105 K/uL — ABNORMAL LOW (ref 150–400)
RBC: 3.54 MIL/uL — ABNORMAL LOW (ref 4.22–5.81)
RDW: 15.7 % — ABNORMAL HIGH (ref 11.5–15.5)
WBC Count: 5 K/uL (ref 4.0–10.5)
nRBC: 0 % (ref 0.0–0.2)

## 2024-05-20 LAB — RAD ONC ARIA SESSION SUMMARY
Course Elapsed Days: 28
Plan Fractions Treated to Date: 21
Plan Prescribed Dose Per Fraction: 2 Gy
Plan Total Fractions Prescribed: 30
Plan Total Prescribed Dose: 60 Gy
Reference Point Dosage Given to Date: 42 Gy
Reference Point Session Dosage Given: 2 Gy
Session Number: 21

## 2024-05-20 LAB — CMP (CANCER CENTER ONLY)
ALT: 9 U/L (ref 0–44)
AST: 14 U/L — ABNORMAL LOW (ref 15–41)
Albumin: 3.7 g/dL (ref 3.5–5.0)
Alkaline Phosphatase: 58 U/L (ref 38–126)
Anion gap: 5 (ref 5–15)
BUN: 8 mg/dL (ref 8–23)
CO2: 29 mmol/L (ref 22–32)
Calcium: 9.1 mg/dL (ref 8.9–10.3)
Chloride: 101 mmol/L (ref 98–111)
Creatinine: 0.66 mg/dL (ref 0.61–1.24)
GFR, Estimated: >60 mL/min (ref >60)
Glucose, Bld: 122 mg/dL — ABNORMAL HIGH (ref 70–99)
Potassium: 4.1 mmol/L (ref 3.5–5.1)
Sodium: 135 mmol/L (ref 135–145)
Total Bilirubin: 0.7 mg/dL (ref 0.0–1.2)
Total Protein: 6.8 g/dL (ref 6.5–8.1)

## 2024-05-20 MED ORDER — SODIUM CHLORIDE 0.9 % IV SOLN: INTRAVENOUS | Status: DC

## 2024-05-20 MED ORDER — PALONOSETRON HCL INJECTION 0.25 MG/5ML
0.2500 mg | Freq: Once | INTRAVENOUS | Status: AC
  Administered 2024-05-20: 0.25 mg via INTRAVENOUS
  Filled 2024-05-20: qty 5

## 2024-05-20 MED ORDER — SODIUM CHLORIDE 0.9 % IV SOLN
156.4000 mg | Freq: Once | INTRAVENOUS | Status: AC
  Administered 2024-05-20: 160 mg via INTRAVENOUS
  Filled 2024-05-20: qty 16

## 2024-05-20 MED ORDER — DEXAMETHASONE SOD PHOSPHATE PF 10 MG/ML IJ SOLN
10.0000 mg | Freq: Once | INTRAMUSCULAR | Status: AC
  Administered 2024-05-20: 10 mg via INTRAVENOUS

## 2024-05-20 MED ORDER — FAMOTIDINE IN NACL 20-0.9 MG/50ML-% IV SOLN
20.0000 mg | Freq: Once | INTRAVENOUS | Status: AC
  Administered 2024-05-20: 20 mg via INTRAVENOUS
  Filled 2024-05-20: qty 50

## 2024-05-20 MED ORDER — DIPHENHYDRAMINE HCL 50 MG/ML IJ SOLN
50.0000 mg | Freq: Once | INTRAMUSCULAR | Status: AC
  Administered 2024-05-20: 50 mg via INTRAVENOUS
  Filled 2024-05-20: qty 1

## 2024-05-20 MED ORDER — SODIUM CHLORIDE 0.9 % IV SOLN
45.0000 mg/m2 | Freq: Once | INTRAVENOUS | Status: AC
  Administered 2024-05-20: 78 mg via INTRAVENOUS
  Filled 2024-05-20: qty 13

## 2024-05-20 NOTE — Progress Notes (Signed)
 Per Dr. Jeannett note from 10/6, Experiencing significant nausea and discomfort post-chemotherapy, questioning continuation due to side effects. Per Dr. Sherrod today, I am ok with treatment. I do not think the chemotherapy is the cause of his problem.  Karista Aispuro, PharmD, MBA

## 2024-05-21 ENCOUNTER — Other Ambulatory Visit: Payer: Self-pay

## 2024-05-21 ENCOUNTER — Ambulatory Visit
Admission: RE | Admit: 2024-05-21 | Discharge: 2024-05-21 | Disposition: A | Source: Ambulatory Visit | Attending: Radiation Oncology

## 2024-05-21 DIAGNOSIS — Z510 Encounter for antineoplastic radiation therapy: Secondary | ICD-10-CM | POA: Diagnosis not present

## 2024-05-21 DIAGNOSIS — Z79633 Long term (current) use of mitotic inhibitor: Secondary | ICD-10-CM | POA: Diagnosis not present

## 2024-05-21 LAB — RAD ONC ARIA SESSION SUMMARY
Course Elapsed Days: 29
Plan Fractions Treated to Date: 22
Plan Prescribed Dose Per Fraction: 2 Gy
Plan Total Fractions Prescribed: 30
Plan Total Prescribed Dose: 60 Gy
Reference Point Dosage Given to Date: 44 Gy
Reference Point Session Dosage Given: 2 Gy
Session Number: 22

## 2024-05-22 ENCOUNTER — Ambulatory Visit
Admission: RE | Admit: 2024-05-22 | Discharge: 2024-05-22 | Disposition: A | Discharge status: Home / Self Care | Source: Ambulatory Visit | Attending: Radiation Oncology | Admitting: Radiation Oncology

## 2024-05-22 ENCOUNTER — Other Ambulatory Visit: Payer: Self-pay

## 2024-05-22 DIAGNOSIS — Z510 Encounter for antineoplastic radiation therapy: Secondary | ICD-10-CM | POA: Diagnosis not present

## 2024-05-22 LAB — RAD ONC ARIA SESSION SUMMARY
Course Elapsed Days: 30
Plan Fractions Treated to Date: 23
Plan Prescribed Dose Per Fraction: 2 Gy
Plan Total Fractions Prescribed: 30
Plan Total Prescribed Dose: 60 Gy
Reference Point Dosage Given to Date: 46 Gy
Reference Point Session Dosage Given: 2 Gy
Session Number: 23

## 2024-05-23 ENCOUNTER — Other Ambulatory Visit: Payer: Self-pay

## 2024-05-23 ENCOUNTER — Ambulatory Visit
Admission: RE | Admit: 2024-05-23 | Discharge: 2024-05-23 | Disposition: A | Discharge status: Home / Self Care | Source: Ambulatory Visit | Attending: Radiation Oncology | Admitting: Radiation Oncology

## 2024-05-23 DIAGNOSIS — I2510 Atherosclerotic heart disease of native coronary artery without angina pectoris: Secondary | ICD-10-CM | POA: Diagnosis not present

## 2024-05-23 DIAGNOSIS — Z79633 Long term (current) use of mitotic inhibitor: Secondary | ICD-10-CM | POA: Diagnosis not present

## 2024-05-23 DIAGNOSIS — I10 Essential (primary) hypertension: Secondary | ICD-10-CM | POA: Diagnosis not present

## 2024-05-23 DIAGNOSIS — J449 Chronic obstructive pulmonary disease, unspecified: Secondary | ICD-10-CM | POA: Diagnosis not present

## 2024-05-23 DIAGNOSIS — F17210 Nicotine dependence, cigarettes, uncomplicated: Secondary | ICD-10-CM | POA: Diagnosis not present

## 2024-05-23 DIAGNOSIS — Z79630 Long term (current) use of alkylating agent: Secondary | ICD-10-CM | POA: Diagnosis not present

## 2024-05-23 DIAGNOSIS — Z510 Encounter for antineoplastic radiation therapy: Secondary | ICD-10-CM | POA: Diagnosis not present

## 2024-05-23 DIAGNOSIS — C3412 Malignant neoplasm of upper lobe, left bronchus or lung: Secondary | ICD-10-CM | POA: Diagnosis not present

## 2024-05-23 DIAGNOSIS — Z5111 Encounter for antineoplastic chemotherapy: Secondary | ICD-10-CM | POA: Diagnosis not present

## 2024-05-23 LAB — RAD ONC ARIA SESSION SUMMARY
Course Elapsed Days: 31
Plan Fractions Treated to Date: 24
Plan Prescribed Dose Per Fraction: 2 Gy
Plan Total Fractions Prescribed: 30
Plan Total Prescribed Dose: 60 Gy
Reference Point Dosage Given to Date: 48 Gy
Reference Point Session Dosage Given: 2 Gy
Session Number: 24

## 2024-05-23 NOTE — Progress Notes (Signed)
 [facility] OFFICE PROGRESS NOTE  Stephen Manna, MD [address] [place] [address]  DIAGNOSIS: Stage IIIa (T1b, N2, M0) non-small cell lung cancer, squamous cell carcinoma presented with left upper lobe lung nodule in addition to left hilar and mediastinal lymphadenopathy. This was diagnosed in August 2025.   PRIOR THERAPY: The patient also has a history of stage IIb (T3, N0, M0) non-small cell lung cancer, adenocarcinoma status post right upper lobectomy with lymph node dissection at [facility] on May 17, 2012 with a tumor size of 6.5 cm. He was also treated with adjuvant chemotherapy at that tim   CURRENT THERAPY: A course of concurrent chemoradiation with weekly carboplatin  for AUC of 2 and paclitaxel  45 mg/M2 status post 5 cycles.   INTERVAL HISTORY: Stephen Leon 78 y.o. male returns to clinic today for a follow-up visit accompanied by a support member.  The patient was last seen in the clinic on 05/13/2024 by Dr. Sherrod.   The patient is currently undergoing concurrent chemoradiation.  The last day radiation is tentatively scheduled for 06/05/2024. He was endorsing nausea and vomiting at his last appointment on 10/6 with Dr. Sherrod. The patient missed his infusion on 10/6 and reports that his nausea was the same. Therefore, he wonders if this is related to radiation. He then underwent chemotherapy last week.   He finds his Zofran  to be more effective than Compazine .  He states that the steroid exacerbates his nausea.  He denies a lot of vomiting.  It is primarily nausea and it tends to be worse later in the week.  He also reports some fatigue and persistent cough, especially at night when he lays down.  He is prescribed Tussionex which is effective for him but he only takes this at night and he tries not to take it because of the limited supply.  He denies any changes in his baseline shortness of  breath which he attributes to COPD and having lung surgery in 2012.  He denies any significant weight loss since last being seen but he did lose 15 pounds prior to starting treatment.  He has some esophagitis but he did not realize that you were supposed to dissolve the Carafate and liquid.  He experiences diarrhea occasionally, particularly after consuming fried foods, but does not report constipation.  He is experiencing hair thinning, which he attributes to his current treatment regimen. He is concerned about whether his hair will grow back.  He is going on a cruise to [place]  in November and is expected to return on 11/23. He is here today for evaluation and repeat blood work before considering undergoing cycle #6.  His last day radiation is scheduled for 10/29.    MEDICAL HISTORY: Past Medical History:  Diagnosis Date   Anxiety    Bronchitis    Cancer (HCC) 2016   lung   COPD (chronic obstructive pulmonary disease) (HCC)    Coronary artery disease 09/26/2023   Cough 07/13/2007   Qualifier: Diagnosis of  By: Georgian ROSALEA CHARM Lamar    Dyspnea    due to copd - w/exertion   GERD (gastroesophageal reflux disease)    Hypertension    pt denies this and is not on bp meds but vascular note states htn   LUNG NODULE 07/13/2007   Qualifier: Diagnosis of  By: Georgian ROSALEA CHARM Lamar    Myocardial infarction [facility]) 2023   mild per patient   Nodule of left  lung 03/2024   upper lobe   Pneumonia 2013   Thoracic aortic aneurysm    followed by vascular    ALLERGIES:  has no known allergies.  MEDICATIONS:  Current Outpatient Medications  Medication Sig Dispense Refill   sucralfate (CARAFATE) 1 g tablet Take 1 tablet (1 g total) by mouth 4 (four) times daily -  with meals and at bedtime. 5 min before meals for radiation induced esophagitis 120 tablet 2   albuterol  (VENTOLIN  HFA) 108 (90 Base) MCG/ACT inhaler Inhale 2 puffs into the lungs every 6 (six) hours as needed.     aspirin  EC 81 MG tablet  Take 81 mg by mouth daily. Swallow whole.     budesonide-glycopyrrolate -formoterol (BREZTRI  AEROSPHERE) 160-9-4.8 MCG/ACT AERO inhaler Inhale 2 puffs into the lungs in the morning and at bedtime. 11.8 g 0   budesonide-glycopyrrolate -formoterol (BREZTRI  AEROSPHERE) 160-9-4.8 MCG/ACT AERO inhaler Inhale 2 puffs into the lungs in the morning and at bedtime. (Patient not taking: Reported on 04/29/2024)     chlorpheniramine-HYDROcodone  (TUSSIONEX) 10-8 MG/5ML Take 5 mLs by mouth at bedtime as needed. 473 mL 0   dexamethasone  (DECADRON ) 4 MG tablet Take 2 tablets daily for 2 days, start the day after chemotherapy. Take with food. 30 tablet 1   gabapentin  (NEURONTIN ) 800 MG tablet Take 800 mg by mouth 2 (two) times daily.      HYDROcodone -acetaminophen  (NORCO/VICODIN) 5-325 MG tablet Take 1 tablet by mouth every 12 (twelve) hours as needed for moderate pain (pain score 4-6). 30 tablet 0   ibuprofen  (ADVIL ) 800 MG tablet Take 800 mg by mouth every 8 (eight) hours as needed for mild pain.     ipratropium-albuterol  (DUONEB) 0.5-2.5 (3) MG/3ML SOLN Inhale 3 mLs into the lungs every 4 (four) hours as needed (When he get sick and the doctor tells him to take it).     lidocaine -prilocaine  (EMLA ) cream Apply to affected area once 30 g 3   omeprazole (PRILOSEC) 40 MG capsule Take 40 mg by mouth daily.     ondansetron  (ZOFRAN ) 8 MG tablet Take 1 tablet (8 mg total) by mouth every 8 (eight) hours as needed for nausea or vomiting. Start on the third day after chemotherapy. 30 tablet 1   prochlorperazine  (COMPAZINE ) 10 MG tablet Take 1 tablet (10 mg total) by mouth every 6 (six) hours as needed for nausea or vomiting. 30 tablet 1   tamsulosin (FLOMAX) 0.4 MG CAPS capsule Take 0.4 mg by mouth at bedtime.     Current Facility-Administered Medications  Medication Dose Route Frequency Provider Last Rate Last Admin   0.9 %  sodium chloride  infusion   Intravenous Once Melane Windholz L, PA-C       [START ON  06/05/2024] 0.9 %  sodium chloride  infusion   Intravenous Once Kashmere Daywalt L, PA-C       ondansetron  (ZOFRAN ) injection 8 mg  8 mg Intravenous Once Marlowe Lawes L, PA-C        SURGICAL HISTORY:  Past Surgical History:  Procedure Laterality Date   BIOPSY  07/13/2023   Procedure: BIOPSY;  Surgeon: Onita Elspeth Sharper, DO;  Location: [facility] ENDOSCOPY;  Service: Gastroenterology;;   BRONCHIAL BRUSHINGS  03/26/2024   Procedure: BRONCHOSCOPY, WITH BRUSH BIOPSY;  Surgeon: [name], MD;  Location: [facility] ENDOSCOPY;  Service: Pulmonary;;   BRONCHIAL NEEDLE ASPIRATION BIOPSY  03/26/2024   Procedure: BRONCHOSCOPY, WITH NEEDLE ASPIRATION BIOPSY;  Surgeon: [name], MD;  Location: [facility] ENDOSCOPY;  Service: Pulmonary;;   COLONOSCOPY N/A  10/27/2022   hx polyps; Procedure: COLONOSCOPY;  Surgeon: Onita Elspeth Sharper, DO;  Location: [facility] ENDOSCOPY;  Service: Gastroenterology;  Laterality: N/A;   COLONOSCOPY WITH PROPOFOL  N/A 07/13/2023   Procedure: COLONOSCOPY WITH PROPOFOL ;  Surgeon: Onita Elspeth Sharper, DO;  Location: [facility] ENDOSCOPY;  Service: Gastroenterology;  Laterality: N/A;   CORONARY PRESSURE/FFR STUDY N/A 05/19/2020   Procedure: INTRAVASCULAR PRESSURE WIRE/FFR STUDY;  Surgeon: Ammon Blunt, MD;  Location: [facility] INVASIVE CV LAB;  Service: Cardiovascular;  Laterality: N/A;   DIAGNOSTIC LAPAROSCOPY     ENDOBRONCHIAL ULTRASOUND Left 03/26/2024   Procedure: ENDOBRONCHIAL ULTRASOUND (EBUS);  Surgeon: [name], MD;  Location: [facility] ENDOSCOPY;  Service: Pulmonary;  Laterality: Left;   ESOPHAGOGASTRODUODENOSCOPY N/A 10/27/2022   Procedure: ESOPHAGOGASTRODUODENOSCOPY (EGD);  Surgeon: Onita Elspeth Sharper, DO;  Location: [facility] ENDOSCOPY;  Service: Gastroenterology;  Laterality: N/A;   EYE SURGERY Bilateral    HERNIA REPAIR     KYPHOPLASTY N/A 12/13/2018   Procedure: KYPHOPLASTY- T7;  Surgeon: Kathlynn Sharper, MD;  Location: [facility] ORS;  Service: Orthopedics;   Laterality: N/A;   LEFT HEART CATH N/A 05/11/2022   Procedure: Left Heart Cath;  Surgeon: Ammon Blunt, MD;  Location: [facility] INVASIVE CV LAB;  Service: Cardiovascular;  Laterality: N/A;   LEFT HEART CATH AND CORONARY ANGIOGRAPHY Left 05/19/2020   Procedure: LEFT HEART CATH AND CORONARY ANGIOGRAPHY;  Surgeon: Ammon Blunt, MD;  Location: [facility] INVASIVE CV LAB;  Service: Cardiovascular;  Laterality: Left;   Robotic assisted Right upper lobectomy Right 05/17/2012   [facility]   VIDEO BRONCHOSCOPY WITH ENDOBRONCHIAL NAVIGATION Left 03/26/2024   Procedure: VIDEO BRONCHOSCOPY WITH ENDOBRONCHIAL NAVIGATION;  Surgeon: [name], MD;  Location: [facility] ENDOSCOPY;  Service: Pulmonary;  Laterality: Left;   XI ROBOTIC ASSISTED INGUINAL HERNIA REPAIR WITH MESH Right 07/19/2019   Procedure: XI ROBOTIC ASSISTED INGUINAL HERNIA REPAIR WITH MESH;  Surgeon: Tye Millet, DO;  Location: [facility] ORS;  Service: General;  Laterality: Right;    REVIEW OF SYSTEMS:   Review of Systems  Constitutional: Positive for fatigue.  Negative for appetite change, chills, fever and unexpected weight change.  HENT: Positive for esophagitis.  Negative for mouth sores, nosebleeds, sore throat and trouble swallowing.   Eyes: Negative for eye problems and icterus.  Respiratory: Stable dyspnea on exertion and cough. Negative for hemoptysis and wheezing.   Cardiovascular: Negative for chest pain and leg swelling.  Gastrointestinal: Positive for occasional nausea and vomiting. Occasional diarrhea. Negative for abdominal pain, and constipation.  Genitourinary: Negative for bladder incontinence, difficulty urinating, dysuria, frequency and hematuria.   Musculoskeletal: Negative for back pain, gait problem, neck pain and neck stiffness.  Skin: Negative for itching and rash.  Neurological: Negative for dizziness, extremity weakness, gait problem, headaches, light-headedness and seizures.  Hematological:  Negative for adenopathy. Does not bruise/bleed easily.  Psychiatric/Behavioral: Negative for confusion, depression and sleep disturbance. The patient is not nervous/anxious.     PHYSICAL EXAMINATION:  Blood pressure 116/67, pulse 68, temperature (!) 97.3 F (36.3 C), temperature source Temporal, resp. rate 16, height 5' 7 (1.702 m), SpO2 97%.  ECOG PERFORMANCE STATUS: 1  Physical Exam  Constitutional: Oriented to person, place, and time and well-developed, well-nourished, and in no distress.  HENT:  Head: Normocephalic and atraumatic.  Mouth/Throat: Oropharynx is clear and moist. No oropharyngeal exudate.  Eyes: Conjunctivae are normal. Right eye exhibits no discharge. Left eye exhibits no discharge. No scleral icterus.  Neck: Normal range of motion. Neck supple.  Cardiovascular: Normal rate, regular rhythm, normal heart  sounds and intact distal pulses.   Pulmonary/Chest: Effort normal and breath sounds normal. No respiratory distress. No wheezes. No rales.  Abdominal: Soft. Bowel sounds are normal. Exhibits no distension and no mass. There is no tenderness.  Musculoskeletal: Normal range of motion. Exhibits no edema.  Lymphadenopathy:    No cervical adenopathy.  Neurological: Alert and oriented to person, place, and time. Exhibits normal muscle tone. Gait normal. Coordination normal.  Skin: Skin is warm and dry. No rash noted. Not diaphoretic. No erythema. No pallor.  Psychiatric: Mood, memory and judgment normal.  Vitals reviewed.  LABORATORY DATA: Lab Results  Component Value Date   WBC 4.3 05/27/2024   HGB 11.3 (L) 05/27/2024   HCT 32.7 (L) 05/27/2024   MCV 96.5 05/27/2024   PLT 72 (L) 05/27/2024      Chemistry      Component Value Date/Time   NA 134 (L) 05/27/2024 0821   K 3.8 05/27/2024 0821   CL 100 05/27/2024 0821   CO2 28 05/27/2024 0821   BUN 11 05/27/2024 0821   CREATININE 0.75 05/27/2024 0821      Component Value Date/Time   CALCIUM  9.2 05/27/2024 0821    ALKPHOS 58 05/27/2024 0821   AST 17 05/27/2024 0821   ALT 10 05/27/2024 0821   BILITOT 0.7 05/27/2024 0821       RADIOGRAPHIC STUDIES:  No results found.   ASSESSMENT/PLAN:  This is a very pleasant 78 year old Caucasian male with stage IIIa (T1b, N2, M0) non-small cell lung cancer, squamous cell carcinoma.  The patient presented with a left upper lobe lung nodule in addition to left hilar and mediastinal lymphadenopathy.  This was diagnosed in August 2025.  The patient also has a history of stage IIb (T3, N0, M0) non-small cell lung cancer, adenocarcinoma.  The patient is status post right upper lobectomy and lymph node dissection at [facility] on 05/17/2012 with a tumor size of 6.5 cm.  He was also treated with adjuvant chemotherapy at that time.  Currently, the patient is on concurrent chemoradiation with carboplatin  for an AUC of 2 and paclitaxel  45 mg/m.  He is status post 5 cycles.  His last day radiation is tentatively scheduled for 06/05/2024.  Labs were reviewed.  His platelet count is 72,000 today.  His labs are not within parameters for treatment.  Therefore we will cancel treatment today.  His last treatment will be next week on 06/05/2024 if labs permit.  I will tentatively arrange for IV fluids and antiemetics next week on 06/05/2024.  We reviewed how to take his antiemetics.  Overall it sounds like the patient is mostly having nausea and infrequent vomiting.  The patient has not been taking his Carafate correctly.  We reviewed how to use this by dissolving it in liquid and drinking it as a paste.  We will arrange his restaging CT scan around his travel plans.  He will get his CT scan around 11/24 and we will see him the following week.  Instead of treatment today he will receive 1 L of IV fluids.  - Administer Compazine  every six hours starting in the morning on days 1-3 post-chemotherapy. - Use zofran  antiemetic on days 4-6 post-chemotherapy  and alternate with compazine  if needed.  I refilled his Tussionex to take at bedtime with a larger supply per patient request.  He only takes this at night.  He knows not to take this with Norco and he reports he is not taking Norco at this  time.  The patient was advised to call immediately if he has any concerning symptoms in the interval. The patient voices understanding of current disease status and treatment options and is in agreement with the current care plan. All questions were answered. The patient knows to call the clinic with any problems, questions or concerns. We can certainly see the patient much sooner if necessary   Orders Placed This Encounter  Procedures   CT Chest W Contrast    Standing Status:   Future    Expected Date:   07/01/2024    Expiration Date:   05/27/2025    If indicated for the ordered procedure, I authorize the administration of contrast media per Radiology protocol:   Yes    Does the patient have a contrast media/X-ray dye allergy?:   No    Preferred imaging location?:   [facility]   CBC with Differential (Cancer Center Only)    Standing Status:   Future    Expected Date:   07/08/2024    Expiration Date:   05/27/2025   CMP (Cancer Center only)    Standing Status:   Future    Expected Date:   07/08/2024    Expiration Date:   05/27/2025     The total time spent in the appointment was 20-29 minutes  Gitty Osterlund L Ceaira Ernster, PA-C 05/27/24

## 2024-05-24 ENCOUNTER — Ambulatory Visit
Admission: RE | Admit: 2024-05-24 | Discharge: 2024-05-24 | Disposition: A | Source: Ambulatory Visit | Attending: Radiation Oncology

## 2024-05-24 ENCOUNTER — Ambulatory Visit
Admission: RE | Admit: 2024-05-24 | Discharge: 2024-05-24 | Disposition: A | Discharge status: Home / Self Care | Source: Ambulatory Visit | Attending: Radiation Oncology | Admitting: Radiation Oncology

## 2024-05-24 ENCOUNTER — Other Ambulatory Visit: Payer: Self-pay

## 2024-05-24 DIAGNOSIS — Z510 Encounter for antineoplastic radiation therapy: Secondary | ICD-10-CM | POA: Diagnosis not present

## 2024-05-24 DIAGNOSIS — F17210 Nicotine dependence, cigarettes, uncomplicated: Secondary | ICD-10-CM | POA: Diagnosis not present

## 2024-05-24 DIAGNOSIS — I252 Old myocardial infarction: Secondary | ICD-10-CM | POA: Diagnosis not present

## 2024-05-24 DIAGNOSIS — J449 Chronic obstructive pulmonary disease, unspecified: Secondary | ICD-10-CM | POA: Diagnosis not present

## 2024-05-24 DIAGNOSIS — Z5111 Encounter for antineoplastic chemotherapy: Secondary | ICD-10-CM | POA: Diagnosis not present

## 2024-05-24 DIAGNOSIS — Z79630 Long term (current) use of alkylating agent: Secondary | ICD-10-CM | POA: Diagnosis not present

## 2024-05-24 DIAGNOSIS — Z79633 Long term (current) use of mitotic inhibitor: Secondary | ICD-10-CM | POA: Diagnosis not present

## 2024-05-24 DIAGNOSIS — I10 Essential (primary) hypertension: Secondary | ICD-10-CM | POA: Diagnosis not present

## 2024-05-24 DIAGNOSIS — C3412 Malignant neoplasm of upper lobe, left bronchus or lung: Secondary | ICD-10-CM | POA: Diagnosis not present

## 2024-05-24 LAB — RAD ONC ARIA SESSION SUMMARY
Course Elapsed Days: 32
Plan Fractions Treated to Date: 25
Plan Prescribed Dose Per Fraction: 2 Gy
Plan Total Fractions Prescribed: 30
Plan Total Prescribed Dose: 60 Gy
Reference Point Dosage Given to Date: 50 Gy
Reference Point Session Dosage Given: 2 Gy
Session Number: 25

## 2024-05-27 ENCOUNTER — Inpatient Hospital Stay

## 2024-05-27 ENCOUNTER — Other Ambulatory Visit: Payer: Self-pay

## 2024-05-27 ENCOUNTER — Other Ambulatory Visit

## 2024-05-27 ENCOUNTER — Encounter: Payer: Self-pay | Admitting: Internal Medicine

## 2024-05-27 ENCOUNTER — Inpatient Hospital Stay: Admitting: Physician Assistant

## 2024-05-27 ENCOUNTER — Ambulatory Visit
Admission: RE | Admit: 2024-05-27 | Discharge: 2024-05-27 | Disposition: A | Discharge status: Home / Self Care | Source: Ambulatory Visit | Attending: Radiation Oncology | Admitting: Radiation Oncology

## 2024-05-27 VITALS — BP 116/67 | HR 68 | Temp 97.3°F | Resp 16 | Ht 67.0 in

## 2024-05-27 DIAGNOSIS — Z510 Encounter for antineoplastic radiation therapy: Secondary | ICD-10-CM | POA: Diagnosis not present

## 2024-05-27 DIAGNOSIS — C3492 Malignant neoplasm of unspecified part of left bronchus or lung: Secondary | ICD-10-CM

## 2024-05-27 DIAGNOSIS — C3412 Malignant neoplasm of upper lobe, left bronchus or lung: Secondary | ICD-10-CM | POA: Diagnosis not present

## 2024-05-27 LAB — CBC WITH DIFFERENTIAL (CANCER CENTER ONLY)
Abs Immature Granulocytes: 0.06 K/uL (ref 0.00–0.07)
Basophils Absolute: 0.1 K/uL (ref 0.0–0.1)
Basophils Relative: 2 %
Eosinophils Absolute: 0.1 K/uL (ref 0.0–0.5)
Eosinophils Relative: 2 %
HCT: 32.7 % — ABNORMAL LOW (ref 39.0–52.0)
Hemoglobin: 11.3 g/dL — ABNORMAL LOW (ref 13.0–17.0)
Immature Granulocytes: 1 %
Lymphocytes Relative: 20 %
Lymphs Abs: 0.9 K/uL (ref 0.7–4.0)
MCH: 33.3 pg (ref 26.0–34.0)
MCHC: 34.6 g/dL (ref 30.0–36.0)
MCV: 96.5 fL (ref 80.0–100.0)
Monocytes Absolute: 0.6 K/uL (ref 0.1–1.0)
Monocytes Relative: 13 %
Neutro Abs: 2.7 K/uL (ref 1.7–7.7)
Neutrophils Relative %: 62 %
Platelet Count: 72 K/uL — ABNORMAL LOW (ref 150–400)
RBC: 3.39 MIL/uL — ABNORMAL LOW (ref 4.22–5.81)
RDW: 15.7 % — ABNORMAL HIGH (ref 11.5–15.5)
WBC Count: 4.3 K/uL (ref 4.0–10.5)
nRBC: 0 % (ref 0.0–0.2)

## 2024-05-27 LAB — CMP (CANCER CENTER ONLY)
ALT: 10 U/L (ref 0–44)
AST: 17 U/L (ref 15–41)
Albumin: 3.7 g/dL (ref 3.5–5.0)
Alkaline Phosphatase: 58 U/L (ref 38–126)
Anion gap: 6 (ref 5–15)
BUN: 11 mg/dL (ref 8–23)
CO2: 28 mmol/L (ref 22–32)
Calcium: 9.2 mg/dL (ref 8.9–10.3)
Chloride: 100 mmol/L (ref 98–111)
Creatinine: 0.75 mg/dL (ref 0.61–1.24)
GFR, Estimated: >60 mL/min (ref >60)
Glucose, Bld: 92 mg/dL (ref 70–99)
Potassium: 3.8 mmol/L (ref 3.5–5.1)
Sodium: 134 mmol/L — ABNORMAL LOW (ref 135–145)
Total Bilirubin: 0.7 mg/dL (ref 0.0–1.2)
Total Protein: 6.8 g/dL (ref 6.5–8.1)

## 2024-05-27 LAB — RAD ONC ARIA SESSION SUMMARY
Course Elapsed Days: 35
Plan Fractions Treated to Date: 26
Plan Prescribed Dose Per Fraction: 2 Gy
Plan Total Fractions Prescribed: 30
Plan Total Prescribed Dose: 60 Gy
Reference Point Dosage Given to Date: 52 Gy
Reference Point Session Dosage Given: 2 Gy
Session Number: 26

## 2024-05-27 MED ORDER — ONDANSETRON HCL 4 MG/2ML IJ SOLN: 8.0000 mg | Freq: Once | INTRAMUSCULAR | Status: DC

## 2024-05-27 MED ORDER — ONDANSETRON HCL 4 MG/2ML IJ SOLN
8.0000 mg | Freq: Once | INTRAMUSCULAR | Status: AC
  Administered 2024-05-27: 8 mg via INTRAVENOUS
  Filled 2024-05-27: qty 4

## 2024-05-27 MED ORDER — HYDROCOD POLI-CHLORPHE POLI ER 10-8 MG/5ML PO SUER: 5.0000 mL | Freq: Every evening | ORAL | 0 refills | Status: AC | PRN

## 2024-05-27 MED ORDER — SODIUM CHLORIDE 0.9 % IV SOLN: Freq: Once | INTRAVENOUS | Status: DC

## 2024-05-27 NOTE — Patient Instructions (Signed)
 Sodium Chloride  Solution What is this medication? SODIUM CHLORIDE  (SOE dee um KLOOR ide) prevents and treats low levels of sodium in your body. Sodium plays an important role in your hydration level and the health of your muscles and nervous system. This medicine may be used for other purposes; ask your health care provider or pharmacist if you have questions. What should I tell my care team before I take this medication? They need to know if you have any of these conditions: Heart disease High blood pressure High levels of sodium in the blood Kidney disease Liver disease Low salt or sodium diet An unusual or allergic reaction to any medications, foods, dyes, or preservatives Pregnant or trying to get pregnant Breast-feeding How should I use this medication? Take this medication by mouth. Take it as directed on the prescription label at the same time every day. You can take it with or without food. If it upsets your stomach, take it with food. You may mix this medication with water or juice. Take the dose right away after mixing. If giving this medication to an infant, you may mix it with formula or breast milk. Give the dose right away after mixing. Use a specially marked oral syringe, spoon, or dropper to measure each dose. Ask your pharmacist if you do not have one. Household spoons are not accurate. Talk to your care team about the use of this medication in children. While it may be given to children for selected conditions, precautions do apply. Overdosage: If you think you have taken too much of this medicine contact a poison control center or emergency room at once. NOTE: This medicine is only for you. Do not share this medicine with others. What if I miss a dose? If you take this medication on a regular basis, take it as soon as you can. If it is almost time for your next dose, take only that dose. Do not take double or extra doses. What may interact with this  medication? Lithium Steroid medications, such as prednisone or cortisone This list may not describe all possible interactions. Give your health care provider a list of all the medicines, herbs, non-prescription drugs, or dietary supplements you use. Also tell them if you smoke, drink alcohol , or use illegal drugs. Some items may interact with your medicine. What should I watch for while using this medication? Visit your care team for regular checks on your progress. Tell your care team if your symptoms do not start to get better or if they get worse. You may need bloodwork while taking this medication. What side effects may I notice from receiving this medication? Side effects that you should report to your care team as soon as possible: Allergic reactions--skin rash, itching, hives, swelling of the face, lips, tongue, or throat High sodium level--confusion, increased thirst, muscle weakness, unusual weakness or fatigue, twitching muscles Side effects that usually do not require medical attention (report these to your care team if they continue or are bothersome): Dizziness Fatigue Headache Nausea Vomiting This list may not describe all possible side effects. Call your doctor for medical advice about side effects. You may report side effects to FDA at 1-800-FDA-1088. Where should I keep my medication? Keep out of the reach of children and pets. Store at room temperature between 20 and 25 degrees C (68 and 77 degrees F). Get rid of any unused medication after it expires or 90 days after opening, whichever is first. To get rid of medications that are no  longer needed or have expired: Take the medication to a medication take-back program. Check with your pharmacy or law enforcement to find a location. If you cannot return the medication, check the label or package insert to see if the medication should be thrown out in the garbage or flushed down the toilet. If you are not sure, ask your care  team. If it is safe to put it in the trash, pour the medication out of the container. Mix the medication with cat litter, dirt, coffee grounds, or other unwanted substance. Seal the mixture in a bag or container. Put it in the trash. NOTE: This sheet is a summary. It may not cover all possible information. If you have questions about this medicine, talk to your doctor, pharmacist, or health care provider.  2024 Elsevier/Gold Standard (2021-06-03 00:00:00)

## 2024-05-28 ENCOUNTER — Other Ambulatory Visit: Payer: Self-pay

## 2024-05-28 ENCOUNTER — Ambulatory Visit
Admission: RE | Admit: 2024-05-28 | Discharge: 2024-05-28 | Disposition: A | Source: Ambulatory Visit | Attending: Radiation Oncology

## 2024-05-28 DIAGNOSIS — I2510 Atherosclerotic heart disease of native coronary artery without angina pectoris: Secondary | ICD-10-CM | POA: Diagnosis not present

## 2024-05-28 DIAGNOSIS — I252 Old myocardial infarction: Secondary | ICD-10-CM | POA: Diagnosis not present

## 2024-05-28 DIAGNOSIS — Z79630 Long term (current) use of alkylating agent: Secondary | ICD-10-CM | POA: Diagnosis not present

## 2024-05-28 DIAGNOSIS — Z79633 Long term (current) use of mitotic inhibitor: Secondary | ICD-10-CM | POA: Diagnosis not present

## 2024-05-28 DIAGNOSIS — Z510 Encounter for antineoplastic radiation therapy: Secondary | ICD-10-CM | POA: Diagnosis not present

## 2024-05-28 DIAGNOSIS — F17210 Nicotine dependence, cigarettes, uncomplicated: Secondary | ICD-10-CM | POA: Diagnosis not present

## 2024-05-28 DIAGNOSIS — I10 Essential (primary) hypertension: Secondary | ICD-10-CM | POA: Diagnosis not present

## 2024-05-28 DIAGNOSIS — J449 Chronic obstructive pulmonary disease, unspecified: Secondary | ICD-10-CM | POA: Diagnosis not present

## 2024-05-28 DIAGNOSIS — C3412 Malignant neoplasm of upper lobe, left bronchus or lung: Secondary | ICD-10-CM | POA: Diagnosis not present

## 2024-05-28 DIAGNOSIS — Z5111 Encounter for antineoplastic chemotherapy: Secondary | ICD-10-CM | POA: Diagnosis not present

## 2024-05-28 LAB — RAD ONC ARIA SESSION SUMMARY
Course Elapsed Days: 36
Plan Fractions Treated to Date: 27
Plan Prescribed Dose Per Fraction: 2 Gy
Plan Total Fractions Prescribed: 30
Plan Total Prescribed Dose: 60 Gy
Reference Point Dosage Given to Date: 54 Gy
Reference Point Session Dosage Given: 2 Gy
Session Number: 27

## 2024-05-29 ENCOUNTER — Other Ambulatory Visit: Payer: Self-pay

## 2024-05-29 ENCOUNTER — Ambulatory Visit
Admission: RE | Admit: 2024-05-29 | Discharge: 2024-05-29 | Disposition: A | Source: Ambulatory Visit | Attending: Radiation Oncology

## 2024-05-29 DIAGNOSIS — Z510 Encounter for antineoplastic radiation therapy: Secondary | ICD-10-CM | POA: Diagnosis not present

## 2024-05-29 LAB — RAD ONC ARIA SESSION SUMMARY
Course Elapsed Days: 37
Plan Fractions Treated to Date: 28
Plan Prescribed Dose Per Fraction: 2 Gy
Plan Total Fractions Prescribed: 30
Plan Total Prescribed Dose: 60 Gy
Reference Point Dosage Given to Date: 56 Gy
Reference Point Session Dosage Given: 2 Gy
Session Number: 28

## 2024-05-30 ENCOUNTER — Other Ambulatory Visit: Payer: Self-pay

## 2024-05-30 ENCOUNTER — Ambulatory Visit
Admission: RE | Admit: 2024-05-30 | Discharge: 2024-05-30 | Disposition: A | Discharge status: Home / Self Care | Source: Ambulatory Visit | Attending: Radiation Oncology | Admitting: Radiation Oncology

## 2024-05-30 DIAGNOSIS — Z79633 Long term (current) use of mitotic inhibitor: Secondary | ICD-10-CM | POA: Diagnosis not present

## 2024-05-30 DIAGNOSIS — F17210 Nicotine dependence, cigarettes, uncomplicated: Secondary | ICD-10-CM | POA: Diagnosis not present

## 2024-05-30 DIAGNOSIS — I2510 Atherosclerotic heart disease of native coronary artery without angina pectoris: Secondary | ICD-10-CM | POA: Diagnosis not present

## 2024-05-30 DIAGNOSIS — C3412 Malignant neoplasm of upper lobe, left bronchus or lung: Secondary | ICD-10-CM | POA: Diagnosis not present

## 2024-05-30 DIAGNOSIS — Z510 Encounter for antineoplastic radiation therapy: Secondary | ICD-10-CM | POA: Diagnosis not present

## 2024-05-30 LAB — RAD ONC ARIA SESSION SUMMARY
Course Elapsed Days: 38
Plan Fractions Treated to Date: 29
Plan Prescribed Dose Per Fraction: 2 Gy
Plan Total Fractions Prescribed: 30
Plan Total Prescribed Dose: 60 Gy
Reference Point Dosage Given to Date: 58 Gy
Reference Point Session Dosage Given: 2 Gy
Session Number: 29

## 2024-05-31 ENCOUNTER — Other Ambulatory Visit: Payer: Self-pay

## 2024-05-31 ENCOUNTER — Ambulatory Visit
Admission: RE | Admit: 2024-05-31 | Discharge: 2024-05-31 | Disposition: A | Discharge status: Home / Self Care | Source: Ambulatory Visit | Attending: Radiation Oncology | Admitting: Radiation Oncology

## 2024-05-31 DIAGNOSIS — Z510 Encounter for antineoplastic radiation therapy: Secondary | ICD-10-CM | POA: Diagnosis not present

## 2024-05-31 LAB — RAD ONC ARIA SESSION SUMMARY
Course Elapsed Days: 39
Plan Fractions Treated to Date: 30
Plan Prescribed Dose Per Fraction: 2 Gy
Plan Total Fractions Prescribed: 30
Plan Total Prescribed Dose: 60 Gy
Reference Point Dosage Given to Date: 60 Gy
Reference Point Session Dosage Given: 2 Gy
Session Number: 30

## 2024-06-03 ENCOUNTER — Inpatient Hospital Stay

## 2024-06-03 ENCOUNTER — Ambulatory Visit
Admission: RE | Admit: 2024-06-03 | Discharge: 2024-06-03 | Disposition: A | Discharge status: Home / Self Care | Source: Ambulatory Visit | Attending: Radiation Oncology | Admitting: Radiation Oncology

## 2024-06-03 ENCOUNTER — Other Ambulatory Visit

## 2024-06-03 ENCOUNTER — Inpatient Hospital Stay (HOSPITAL_BASED_OUTPATIENT_CLINIC_OR_DEPARTMENT_OTHER): Admitting: Internal Medicine

## 2024-06-03 ENCOUNTER — Other Ambulatory Visit: Payer: Self-pay

## 2024-06-03 VITALS — BP 106/76 | HR 79 | Temp 98.0°F | Resp 17 | Ht 67.0 in | Wt 126.0 lb

## 2024-06-03 DIAGNOSIS — Z79633 Long term (current) use of mitotic inhibitor: Secondary | ICD-10-CM | POA: Diagnosis not present

## 2024-06-03 DIAGNOSIS — C3490 Malignant neoplasm of unspecified part of unspecified bronchus or lung: Secondary | ICD-10-CM

## 2024-06-03 DIAGNOSIS — Z510 Encounter for antineoplastic radiation therapy: Secondary | ICD-10-CM | POA: Diagnosis not present

## 2024-06-03 DIAGNOSIS — F17210 Nicotine dependence, cigarettes, uncomplicated: Secondary | ICD-10-CM | POA: Diagnosis not present

## 2024-06-03 DIAGNOSIS — C3492 Malignant neoplasm of unspecified part of left bronchus or lung: Secondary | ICD-10-CM

## 2024-06-03 DIAGNOSIS — C3412 Malignant neoplasm of upper lobe, left bronchus or lung: Secondary | ICD-10-CM | POA: Diagnosis not present

## 2024-06-03 DIAGNOSIS — I2510 Atherosclerotic heart disease of native coronary artery without angina pectoris: Secondary | ICD-10-CM | POA: Diagnosis not present

## 2024-06-03 DIAGNOSIS — Z5111 Encounter for antineoplastic chemotherapy: Secondary | ICD-10-CM | POA: Diagnosis not present

## 2024-06-03 DIAGNOSIS — Z79630 Long term (current) use of alkylating agent: Secondary | ICD-10-CM | POA: Diagnosis not present

## 2024-06-03 DIAGNOSIS — J449 Chronic obstructive pulmonary disease, unspecified: Secondary | ICD-10-CM | POA: Diagnosis not present

## 2024-06-03 DIAGNOSIS — I252 Old myocardial infarction: Secondary | ICD-10-CM | POA: Diagnosis not present

## 2024-06-03 DIAGNOSIS — I10 Essential (primary) hypertension: Secondary | ICD-10-CM | POA: Diagnosis not present

## 2024-06-03 LAB — RAD ONC ARIA SESSION SUMMARY
Course Elapsed Days: 42
Plan Fractions Treated to Date: 1
Plan Prescribed Dose Per Fraction: 2 Gy
Plan Total Fractions Prescribed: 3
Plan Total Prescribed Dose: 6 Gy
Reference Point Dosage Given to Date: 2 Gy
Reference Point Session Dosage Given: 2 Gy
Session Number: 31

## 2024-06-03 LAB — CBC WITH DIFFERENTIAL (CANCER CENTER ONLY)
Abs Immature Granulocytes: 0.05 K/uL (ref 0.00–0.07)
Basophils Absolute: 0.1 K/uL (ref 0.0–0.1)
Basophils Relative: 1 %
Eosinophils Absolute: 0 K/uL (ref 0.0–0.5)
Eosinophils Relative: 1 %
HCT: 36.6 % — ABNORMAL LOW (ref 39.0–52.0)
Hemoglobin: 12.6 g/dL — ABNORMAL LOW (ref 13.0–17.0)
Immature Granulocytes: 1 %
Lymphocytes Relative: 15 %
Lymphs Abs: 0.8 K/uL (ref 0.7–4.0)
MCH: 34.1 pg — ABNORMAL HIGH (ref 26.0–34.0)
MCHC: 34.4 g/dL (ref 30.0–36.0)
MCV: 98.9 fL (ref 80.0–100.0)
Monocytes Absolute: 0.9 K/uL (ref 0.1–1.0)
Monocytes Relative: 17 %
Neutro Abs: 3.5 K/uL (ref 1.7–7.7)
Neutrophils Relative %: 65 %
Platelet Count: 124 K/uL — ABNORMAL LOW (ref 150–400)
RBC: 3.7 MIL/uL — ABNORMAL LOW (ref 4.22–5.81)
RDW: 16.6 % — ABNORMAL HIGH (ref 11.5–15.5)
WBC Count: 5.4 K/uL (ref 4.0–10.5)
nRBC: 0 % (ref 0.0–0.2)

## 2024-06-03 LAB — CMP (CANCER CENTER ONLY)
ALT: 9 U/L (ref 0–44)
AST: 17 U/L (ref 15–41)
Albumin: 3.9 g/dL (ref 3.5–5.0)
Alkaline Phosphatase: 68 U/L (ref 38–126)
Anion gap: 8 (ref 5–15)
BUN: 14 mg/dL (ref 8–23)
CO2: 28 mmol/L (ref 22–32)
Calcium: 9.1 mg/dL (ref 8.9–10.3)
Chloride: 101 mmol/L (ref 98–111)
Creatinine: 0.83 mg/dL (ref 0.61–1.24)
GFR, Estimated: >60 mL/min (ref >60)
Glucose, Bld: 116 mg/dL — ABNORMAL HIGH (ref 70–99)
Potassium: 3.6 mmol/L (ref 3.5–5.1)
Sodium: 137 mmol/L (ref 135–145)
Total Bilirubin: 0.7 mg/dL (ref 0.0–1.2)
Total Protein: 7.4 g/dL (ref 6.5–8.1)

## 2024-06-03 MED ORDER — DEXAMETHASONE SOD PHOSPHATE PF 10 MG/ML IJ SOLN
10.0000 mg | Freq: Once | INTRAMUSCULAR | Status: AC
  Administered 2024-06-03: 10 mg via INTRAVENOUS

## 2024-06-03 MED ORDER — SODIUM CHLORIDE 0.9 % IV SOLN: INTRAVENOUS | Status: AC

## 2024-06-03 MED ORDER — SODIUM CHLORIDE 0.9 % IV SOLN: INTRAVENOUS | Status: DC

## 2024-06-03 MED ORDER — PALONOSETRON HCL INJECTION 0.25 MG/5ML
0.2500 mg | Freq: Once | INTRAVENOUS | Status: AC
  Administered 2024-06-03: 0.25 mg via INTRAVENOUS
  Filled 2024-06-03: qty 5

## 2024-06-03 MED ORDER — FAMOTIDINE IN NACL 20-0.9 MG/50ML-% IV SOLN
20.0000 mg | Freq: Once | INTRAVENOUS | Status: AC
  Administered 2024-06-03: 20 mg via INTRAVENOUS
  Filled 2024-06-03: qty 50

## 2024-06-03 MED ORDER — SODIUM CHLORIDE 0.9 % IV SOLN
45.0000 mg/m2 | Freq: Once | INTRAVENOUS | Status: AC
  Administered 2024-06-03: 78 mg via INTRAVENOUS
  Filled 2024-06-03: qty 13

## 2024-06-03 MED ORDER — SODIUM CHLORIDE 0.9 % IV SOLN
156.4000 mg | Freq: Once | INTRAVENOUS | Status: AC
  Administered 2024-06-03: 160 mg via INTRAVENOUS
  Filled 2024-06-03: qty 16

## 2024-06-03 MED ORDER — DIPHENHYDRAMINE HCL 50 MG/ML IJ SOLN
50.0000 mg | Freq: Once | INTRAMUSCULAR | Status: AC
  Administered 2024-06-03: 50 mg via INTRAVENOUS
  Filled 2024-06-03: qty 1

## 2024-06-03 NOTE — Progress Notes (Signed)
 [facility] [phone]   [phone]  OFFICE PROGRESS NOTE  Stephen Manna, MD [address] [place] [address]  DIAGNOSIS: Stage IIIa (T1b, N2, M0) non-small cell lung cancer, squamous cell carcinoma presented with left upper lobe lung nodule in addition to left hilar and mediastinal lymphadenopathy. This was diagnosed in August 2025.    PRIOR THERAPY: The patient also has a history of stage IIb (T3, N0, M0) non-small cell lung cancer, adenocarcinoma status post right upper lobectomy with lymph node dissection at [facility] on May 17, 2012 with a tumor size of 6.5 cm. He was also treated with adjuvant chemotherapy at that time.   CURRENT THERAPY: A course of concurrent chemoradiation with weekly carboplatin  for AUC of 2 and paclitaxel  45 mg/M2 status post 5 cycles.  INTERVAL HISTORY: Stephen Leon 78 y.o. male returns to the clinic today for follow-up visit accompanied by his wife.Discussed the use of AI scribe software for clinical note transcription with the patient, who gave verbal consent to proceed.  History of Present Illness Stephen Leon is a 78 year old male with stage 3A non-small cell lung cancer who presents for evaluation before starting the last cycle of chemoradiation. He is accompanied by his wife.  He has stage 3A non-small cell lung cancer, squamous cell carcinoma, diagnosed in August 2025. He is undergoing concurrent chemoradiation with weekly carboplatin  and paclitaxel . He has completed five cycles and is here for evaluation before starting the sixth and final cycle.  He experiences significant fatigue and nausea following his last radiation session on Friday. He slept from Friday until Monday morning, indicating extreme tiredness. He did not receive chemotherapy last week due to low platelet counts, which were resolved by the time of this  visit.  He has not been hydrating well over the weekend due to a lack of appetite and general sickness. He has been mostly bedridden since Friday. He describes an episode where he fell asleep while undressing and woke up with his pants still around his knees, highlighting his fatigue.  He previously took prescribed steroids during the first week of treatment but stopped because they made him feel unwell. He has been managing nausea with anti-nausea medication since then.  Despite the fatigue, no issues with swallowing and he is able to take his medications, including Carafate, which he dissolves in water.    MEDICAL HISTORY: Past Medical History:  Diagnosis Date   Anxiety    Bronchitis    Cancer (HCC) 2016   lung   COPD (chronic obstructive pulmonary disease) (HCC)    Coronary artery disease 09/26/2023   Cough 07/13/2007   Qualifier: Diagnosis of  By: Georgian ROSALEA CHARM Lamar    Dyspnea    due to copd - w/exertion   GERD (gastroesophageal reflux disease)    Hypertension    pt denies this and is not on bp meds but vascular note states htn   LUNG NODULE 07/13/2007   Qualifier: Diagnosis of  By: Georgian ROSALEA CHARM Lamar    Myocardial infarction [facility]) 2023   mild per patient   Nodule of left lung 03/2024   upper lobe   Pneumonia 2013   Thoracic aortic aneurysm    followed by vascular    ALLERGIES:  has no known allergies.  MEDICATIONS:  Current Outpatient Medications  Medication Sig Dispense Refill   sucralfate (CARAFATE) 1 g tablet Take 1 tablet (1 g  total) by mouth 4 (four) times daily -  with meals and at bedtime. 5 min before meals for radiation induced esophagitis 120 tablet 2   albuterol  (VENTOLIN  HFA) 108 (90 Base) MCG/ACT inhaler Inhale 2 puffs into the lungs every 6 (six) hours as needed.     aspirin  EC 81 MG tablet Take 81 mg by mouth daily. Swallow whole.     budesonide-glycopyrrolate -formoterol (BREZTRI  AEROSPHERE) 160-9-4.8 MCG/ACT AERO inhaler Inhale 2 puffs into the lungs in  the morning and at bedtime. 11.8 g 0   budesonide-glycopyrrolate -formoterol (BREZTRI  AEROSPHERE) 160-9-4.8 MCG/ACT AERO inhaler Inhale 2 puffs into the lungs in the morning and at bedtime. (Patient not taking: Reported on 04/29/2024)     chlorpheniramine-HYDROcodone  (TUSSIONEX) 10-8 MG/5ML Take 5 mLs by mouth at bedtime as needed. 473 mL 0   dexamethasone  (DECADRON ) 4 MG tablet Take 2 tablets daily for 2 days, start the day after chemotherapy. Take with food. 30 tablet 1   gabapentin  (NEURONTIN ) 800 MG tablet Take 800 mg by mouth 2 (two) times daily.      HYDROcodone -acetaminophen  (NORCO/VICODIN) 5-325 MG tablet Take 1 tablet by mouth every 12 (twelve) hours as needed for moderate pain (pain score 4-6). 30 tablet 0   ibuprofen  (ADVIL ) 800 MG tablet Take 800 mg by mouth every 8 (eight) hours as needed for mild pain.     ipratropium-albuterol  (DUONEB) 0.5-2.5 (3) MG/3ML SOLN Inhale 3 mLs into the lungs every 4 (four) hours as needed (When he get sick and the doctor tells him to take it).     lidocaine -prilocaine  (EMLA ) cream Apply to affected area once 30 g 3   omeprazole (PRILOSEC) 40 MG capsule Take 40 mg by mouth daily.     ondansetron  (ZOFRAN ) 8 MG tablet Take 1 tablet (8 mg total) by mouth every 8 (eight) hours as needed for nausea or vomiting. Start on the third day after chemotherapy. 30 tablet 1   prochlorperazine  (COMPAZINE ) 10 MG tablet Take 1 tablet (10 mg total) by mouth every 6 (six) hours as needed for nausea or vomiting. 30 tablet 1   tamsulosin (FLOMAX) 0.4 MG CAPS capsule Take 0.4 mg by mouth at bedtime.     No current facility-administered medications for this visit.    SURGICAL HISTORY:  Past Surgical History:  Procedure Laterality Date   BIOPSY  07/13/2023   Procedure: BIOPSY;  Surgeon: Onita Elspeth Sharper, DO;  Location: [facility] ENDOSCOPY;  Service: Gastroenterology;;   BRONCHIAL BRUSHINGS  03/26/2024   Procedure: BRONCHOSCOPY, WITH BRUSH BIOPSY;  Surgeon: [name], MD;   Location: [facility] ENDOSCOPY;  Service: Pulmonary;;   BRONCHIAL NEEDLE ASPIRATION BIOPSY  03/26/2024   Procedure: BRONCHOSCOPY, WITH NEEDLE ASPIRATION BIOPSY;  Surgeon: [name], MD;  Location: [facility] ENDOSCOPY;  Service: Pulmonary;;   COLONOSCOPY N/A 10/27/2022   hx polyps; Procedure: COLONOSCOPY;  Surgeon: Onita Elspeth Sharper, DO;  Location: [facility] ENDOSCOPY;  Service: Gastroenterology;  Laterality: N/A;   COLONOSCOPY WITH PROPOFOL  N/A 07/13/2023   Procedure: COLONOSCOPY WITH PROPOFOL ;  Surgeon: Onita Elspeth Sharper, DO;  Location: [facility] ENDOSCOPY;  Service: Gastroenterology;  Laterality: N/A;   CORONARY PRESSURE/FFR STUDY N/A 05/19/2020   Procedure: INTRAVASCULAR PRESSURE WIRE/FFR STUDY;  Surgeon: Ammon Blunt, MD;  Location: [facility] INVASIVE CV LAB;  Service: Cardiovascular;  Laterality: N/A;   DIAGNOSTIC LAPAROSCOPY     ENDOBRONCHIAL ULTRASOUND Left 03/26/2024   Procedure: ENDOBRONCHIAL ULTRASOUND (EBUS);  Surgeon: [name], MD;  Location: [facility] ENDOSCOPY;  Service: Pulmonary;  Laterality: Left;   ESOPHAGOGASTRODUODENOSCOPY N/A 10/27/2022  Procedure: ESOPHAGOGASTRODUODENOSCOPY (EGD);  Surgeon: Onita Elspeth Sharper, DO;  Location: [facility] ENDOSCOPY;  Service: Gastroenterology;  Laterality: N/A;   EYE SURGERY Bilateral    HERNIA REPAIR     KYPHOPLASTY N/A 12/13/2018   Procedure: KYPHOPLASTY- T7;  Surgeon: Kathlynn Sharper, MD;  Location: [facility] ORS;  Service: Orthopedics;  Laterality: N/A;   LEFT HEART CATH N/A 05/11/2022   Procedure: Left Heart Cath;  Surgeon: Ammon Blunt, MD;  Location: [facility] INVASIVE CV LAB;  Service: Cardiovascular;  Laterality: N/A;   LEFT HEART CATH AND CORONARY ANGIOGRAPHY Left 05/19/2020   Procedure: LEFT HEART CATH AND CORONARY ANGIOGRAPHY;  Surgeon: Ammon Blunt, MD;  Location: [facility] INVASIVE CV LAB;  Service: Cardiovascular;  Laterality: Left;   Robotic assisted Right upper lobectomy Right 05/17/2012   [facility]   VIDEO  BRONCHOSCOPY WITH ENDOBRONCHIAL NAVIGATION Left 03/26/2024   Procedure: VIDEO BRONCHOSCOPY WITH ENDOBRONCHIAL NAVIGATION;  Surgeon: [name], MD;  Location: [facility] ENDOSCOPY;  Service: Pulmonary;  Laterality: Left;   XI ROBOTIC ASSISTED INGUINAL HERNIA REPAIR WITH MESH Right 07/19/2019   Procedure: XI ROBOTIC ASSISTED INGUINAL HERNIA REPAIR WITH MESH;  Surgeon: Tye Millet, DO;  Location: [facility] ORS;  Service: General;  Laterality: Right;    REVIEW OF SYSTEMS:  Constitutional: positive for anorexia, fatigue, and weight loss Eyes: negative Ears, nose, mouth, throat, and face: negative Respiratory: negative Cardiovascular: negative Gastrointestinal: positive for nausea and odynophagia Genitourinary:negative Integument/breast: negative Hematologic/lymphatic: negative Musculoskeletal:positive for back pain Neurological: negative Behavioral/Psych: negative Endocrine: negative Allergic/Immunologic: negative   PHYSICAL EXAMINATION: General appearance: alert, cooperative, fatigued, and no distress Head: Normocephalic, without obvious abnormality, atraumatic Neck: no adenopathy, no JVD, supple, symmetrical, trachea midline, and thyroid  not enlarged, symmetric, no tenderness/mass/nodules Lymph nodes: Cervical, supraclavicular, and axillary nodes normal. Resp: clear to auscultation bilaterally Back: symmetric, no curvature. ROM normal. No CVA tenderness. Cardio: regular rate and rhythm, S1, S2 normal, no murmur, click, rub or gallop GI: soft, non-tender; bowel sounds normal; no masses,  no organomegaly Extremities: extremities normal, atraumatic, no cyanosis or edema Neurologic: Alert and oriented X 3, normal strength and tone. Normal symmetric reflexes. Normal coordination and gait  ECOG PERFORMANCE STATUS: 1 - Symptomatic but completely ambulatory  Blood pressure 106/76, pulse 79, temperature 98 F (36.7 C), temperature source Temporal, resp. rate 17, height 5' 7 (1.702 m), weight 126 lb  (57.2 kg), SpO2 97%.  LABORATORY DATA: Lab Results  Component Value Date   WBC 5.4 06/03/2024   HGB 12.6 (L) 06/03/2024   HCT 36.6 (L) 06/03/2024   MCV 98.9 06/03/2024   PLT 124 (L) 06/03/2024      Chemistry      Component Value Date/Time   NA 134 (L) 05/27/2024 0821   K 3.8 05/27/2024 0821   CL 100 05/27/2024 0821   CO2 28 05/27/2024 0821   BUN 11 05/27/2024 0821   CREATININE 0.75 05/27/2024 0821      Component Value Date/Time   CALCIUM  9.2 05/27/2024 0821   ALKPHOS 58 05/27/2024 0821   AST 17 05/27/2024 0821   ALT 10 05/27/2024 0821   BILITOT 0.7 05/27/2024 0821       RADIOGRAPHIC STUDIES: No results found.   ASSESSMENT AND PLAN: This is a very pleasant 78 years old white male diagnosed with stage IIIa (T1b, N2, M0) non-small cell lung cancer, squamous cell carcinoma presented with left upper lobe lung nodule in addition to left hilar and mediastinal lymphadenopathy. This was diagnosed in August 2025. The patient also has a history  of stage IIb (T3, N0, M0) non-small cell lung cancer, adenocarcinoma status post right upper lobectomy with lymph node dissection at [facility] on May 17, 2012 with a tumor size of 6.5 cm. He was also treated with adjuvant chemotherapy at that time.  The patient is currently undergoing a course of concurrent chemoradiation with weekly carboplatin  for AUC of 2 and paclitaxel  45 mg/M2.  Status post 5 cycles.  He has been complaining of fatigue as well as odynophagia and nausea.  He also has chronic back pain. Assessment and Plan Assessment & Plan Stage 3A non-small cell lung cancer, squamous cell carcinoma Currently undergoing concurrent chemoradiation with weekly carboplatin  and paclitaxel . Today is the last treatment of the current cycle. - Administer last dose of chemoradiation today - Schedule CT scan in early December to evaluate treatment response  Chemoradiation-induced nausea and vomiting Experiencing  nausea and vomiting, likely due to radiation effects. Previously prescribed steroids for nausea, but discontinued due to adverse effects. - Administer anti-nausea medication with chemotherapy - Provide additional liter of IV fluids to aid hydration - Advise use of over-the-counter liquid IV for hydration  Cancer-related fatigue Significant fatigue likely due to cumulative effects of chemoradiation. Fatigue expected to improve over several weeks post-treatment. Discussed potential impact on upcoming travel plans. - Provide additional liter of IV fluids to aid hydration - Advise monitoring fatigue levels and consider postponing travel plans if fatigue persists  Cancer-related anorexia and weight loss Reported lack of appetite and weight loss of 4-5 pounds over the weekend. Appetite affected by treatment-related fatigue and nausea. - Provide additional liter of IV fluids to aid hydration - Encourage nutritional intake as tolerated  Thrombocytopenia secondary to chemoradiation Thrombocytopenia noted last week, leading to omission of chemotherapy. Platelet levels have improved and are adequate for today's treatment. - Proceed with chemotherapy as platelet levels are adequate He was advised to call immediately if he has any other concerning symptoms in the interval.  The patient voices understanding of current disease status and treatment options and is in agreement with the current care plan.  All questions were answered. The patient knows to call the clinic with any problems, questions or concerns. We can certainly see the patient much sooner if necessary.  The total time spent in the appointment was 30 minutes including review of chart and various tests results, discussions about plan of care and coordination of care plan .   Disclaimer: This note was dictated with voice recognition software. Similar sounding words can inadvertently be transcribed and may not be corrected upon review.

## 2024-06-03 NOTE — Patient Instructions (Signed)
 [facility] - A DEPT OF [facility]  Discharge Instructions: Thank you for choosing [facility] to provide your oncology and hematology care.   If you have a lab appointment with the Cancer Center, please go directly to the Cancer Center and check in at the registration area.   Wear comfortable clothing and clothing appropriate for easy access to any Portacath or PICC line.   We strive to give you quality time with your provider. You may need to reschedule your appointment if you arrive late (15 or more minutes).  Arriving late affects you and other patients whose appointments are after yours.  Also, if you miss three or more appointments without notifying the office, you may be dismissed from the clinic at the provider's discretion.      For prescription refill requests, have your pharmacy contact our office and allow 72 hours for refills to be completed.    Today you received the following chemotherapy and/or immunotherapy agents: Paclitaxel  (Taxol ) and Carboplatin    To help prevent nausea and vomiting after your treatment, we encourage you to take your nausea medication as directed.  BELOW ARE SYMPTOMS THAT SHOULD BE REPORTED IMMEDIATELY: *FEVER GREATER THAN 100.4 F (38 C) OR HIGHER *CHILLS OR SWEATING *NAUSEA AND VOMITING THAT IS NOT CONTROLLED WITH YOUR NAUSEA MEDICATION *UNUSUAL SHORTNESS OF BREATH *UNUSUAL BRUISING OR BLEEDING *URINARY PROBLEMS (pain or burning when urinating, or frequent urination) *BOWEL PROBLEMS (unusual diarrhea, constipation, pain near the anus) TENDERNESS IN MOUTH AND THROAT WITH OR WITHOUT PRESENCE OF ULCERS (sore throat, sores in mouth, or a toothache) UNUSUAL RASH, SWELLING OR PAIN  UNUSUAL VAGINAL DISCHARGE OR ITCHING   Items with * indicate a potential emergency and should be followed up as soon as possible or go to the Emergency Department if any problems should occur.  Please show the CHEMOTHERAPY ALERT  CARD or IMMUNOTHERAPY ALERT CARD at check-in to the Emergency Department and triage nurse.  Should you have questions after your visit or need to cancel or reschedule your appointment, please contact [facility] - [facility]  Dept: [phone]  and follow the prompts.  Office hours are 8:00 a.m. to 4:30 p.m. Monday - Friday. Please note that voicemails left after 4:00 p.m. may not be returned until the following business day.  We are closed weekends and major holidays. You have access to a nurse at all times for urgent questions. Please call the main number to the clinic Dept: [phone] and follow the prompts.   For any non-urgent questions, you may also contact your provider using MyChart. We now offer e-Visits for anyone 61 and older to request care online for non-urgent symptoms. For details visit [email or web address].   Also download the MyChart app! Go to the app store, search MyChart, open the app, select [place], and log in with your MyChart username and password.

## 2024-06-04 ENCOUNTER — Other Ambulatory Visit: Payer: Self-pay

## 2024-06-04 ENCOUNTER — Ambulatory Visit
Admission: RE | Admit: 2024-06-04 | Discharge: 2024-06-04 | Disposition: A | Discharge status: Home / Self Care | Source: Ambulatory Visit | Attending: Radiation Oncology | Admitting: Radiation Oncology

## 2024-06-04 DIAGNOSIS — Z902 Acquired absence of lung [part of]: Secondary | ICD-10-CM | POA: Diagnosis not present

## 2024-06-04 DIAGNOSIS — Z510 Encounter for antineoplastic radiation therapy: Secondary | ICD-10-CM | POA: Diagnosis not present

## 2024-06-04 DIAGNOSIS — Z0000 Encounter for general adult medical examination without abnormal findings: Secondary | ICD-10-CM | POA: Diagnosis not present

## 2024-06-04 DIAGNOSIS — C3490 Malignant neoplasm of unspecified part of unspecified bronchus or lung: Secondary | ICD-10-CM | POA: Diagnosis not present

## 2024-06-04 DIAGNOSIS — I77810 Thoracic aortic ectasia: Secondary | ICD-10-CM | POA: Diagnosis not present

## 2024-06-04 DIAGNOSIS — G894 Chronic pain syndrome: Secondary | ICD-10-CM | POA: Diagnosis not present

## 2024-06-04 DIAGNOSIS — Z23 Encounter for immunization: Secondary | ICD-10-CM | POA: Diagnosis not present

## 2024-06-04 DIAGNOSIS — F17210 Nicotine dependence, cigarettes, uncomplicated: Secondary | ICD-10-CM | POA: Diagnosis not present

## 2024-06-04 DIAGNOSIS — G8912 Acute post-thoracotomy pain: Secondary | ICD-10-CM | POA: Diagnosis not present

## 2024-06-04 DIAGNOSIS — Z1331 Encounter for screening for depression: Secondary | ICD-10-CM | POA: Diagnosis not present

## 2024-06-04 LAB — RAD ONC ARIA SESSION SUMMARY
Course Elapsed Days: 43
Plan Fractions Treated to Date: 2
Plan Prescribed Dose Per Fraction: 2 Gy
Plan Total Fractions Prescribed: 3
Plan Total Prescribed Dose: 6 Gy
Reference Point Dosage Given to Date: 4 Gy
Reference Point Session Dosage Given: 2 Gy
Session Number: 32

## 2024-06-05 ENCOUNTER — Other Ambulatory Visit: Payer: Self-pay

## 2024-06-05 ENCOUNTER — Inpatient Hospital Stay

## 2024-06-05 ENCOUNTER — Ambulatory Visit
Admission: RE | Admit: 2024-06-05 | Discharge: 2024-06-05 | Disposition: A | Source: Ambulatory Visit | Attending: Radiation Oncology

## 2024-06-05 ENCOUNTER — Ambulatory Visit
Admission: RE | Admit: 2024-06-05 | Discharge: 2024-06-05 | Disposition: A | Discharge status: Home / Self Care | Source: Ambulatory Visit | Attending: Radiation Oncology | Admitting: Radiation Oncology

## 2024-06-05 DIAGNOSIS — Z510 Encounter for antineoplastic radiation therapy: Secondary | ICD-10-CM | POA: Diagnosis not present

## 2024-06-05 LAB — RAD ONC ARIA SESSION SUMMARY
Course Elapsed Days: 44
Plan Fractions Treated to Date: 3
Plan Prescribed Dose Per Fraction: 2 Gy
Plan Total Fractions Prescribed: 3
Plan Total Prescribed Dose: 6 Gy
Reference Point Dosage Given to Date: 6 Gy
Reference Point Session Dosage Given: 2 Gy
Session Number: 33

## 2024-06-06 NOTE — Radiation Completion Notes (Addendum)
  Radiation Oncology         [phone] ________________________________  Name: Stephen Leon MRN: [id number]  Date of Service: 06/05/2024  DOB: 12-17-1945  End of Treatment Note  Diagnosis: Stage IIIA, [id number], NSCLC, squamous cell carcinoma  of the LUL   Intent: Curative     ==========DELIVERED PLANS==========  First Treatment Date: 2024-04-22 Last Treatment Date: 2024-06-05   Plan Name: Lung_L Site: Lung, Left Technique: 3D Mode: Photon Dose Per Fraction: 2 Gy Prescribed Dose (Delivered / Prescribed): 60 Gy / 60 Gy Prescribed Fxs (Delivered / Prescribed): 30 / 30   Plan Name: Lung_L_Bst Site: Lung, Left Technique: 3D Mode: Photon Dose Per Fraction: 2 Gy Prescribed Dose (Delivered / Prescribed): 6 Gy / 6 Gy Prescribed Fxs (Delivered / Prescribed): 3 / 3     ==========ON TREATMENT VISIT DATES========== 2024-04-26, 2024-05-02, 2024-05-10, 2024-05-17, 2024-05-24, 2024-05-31, 2024-06-05    See weekly On Treatment Notes in Epic for details in the Media tab (listed as Progress notes on the On Treatment Visit Dates listed above). The patient tolerated radiation. He developed fatigue and anticipated skin changes in the treatment field, and esophagitis.  The patient will receive a call in about one month from the radiation oncology department. He will continue follow up with Dr. Sherrod as well.      Donald KYM Husband, PAC

## 2024-06-07 ENCOUNTER — Other Ambulatory Visit: Payer: Self-pay

## 2024-06-10 ENCOUNTER — Encounter: Payer: Self-pay | Admitting: Radiology

## 2024-06-14 ENCOUNTER — Telehealth: Payer: Self-pay

## 2024-06-14 ENCOUNTER — Ambulatory Visit: Payer: Self-pay

## 2024-06-14 NOTE — Telephone Encounter (Signed)
 Pt called stating that when he got out of the shower this morning he noticed swelling in his BLE.  Pt denied pain, redness, nor burning in his legs.  Pt denied the lower extremities being hot to touch nor any recent falls.  Pt stated that his skin has an indention when asked to squeeze his lower extremities.  Reviewed pt's chart and saw where the pt recently completed Carboplatin , Paclitaxel  with XRT.  Informed pt that the swelling he's experiencing could possibly be a side effect from the chemotherapy and radiation he received.  Instructed pt to elevated his lower extremities as much as possible throughout the day, sleep in a bed with his feet elevated on pillows, refrain from stating on his feet for long periods of time, and to wear compression socks/hoses 15mmg to of compression if possible.  Pt stated he does not have compression socks nor hoses.  Stated the compression socks or hoses can be purchased at [employer], [employer], or [employer].  Stated that this nurse will make Dr. Sherrod and his Team aware of the pt's call.  Pt verbalized understanding and had no further questions or concerns at this time.

## 2024-06-14 NOTE — Telephone Encounter (Signed)
 LVM for pt stating that Cassie would like the pt to monitor his legs, elevate them as much as possible, and to wear compression socks or hoses.  Also, instructed pt to contact Dr. Jeannett office should he develop pain, swelling increases, and the color of the legs change to reddish. Provided pt with a callback number should he have additional questions or concerns.

## 2024-06-14 NOTE — Telephone Encounter (Signed)
     Copied from CRM [id number]. Topic: Clinical - Red Word Triage >> Jun 14, 2024  8:48 AM Nathanel DEL wrote: Red Word that prompted transfer to Nurse Triage: pt just completed 33 days of radiation.  Each Monday chemo. This morning after a shower, he noticed his ankles are swollen.  SOB for quite a while. Sees pulm at [facility]. Emery is cancer dr. [place] for Disposition  [1] MILD swelling of both ankles (e.g., ankle joints look swollen; or bilateral mild pedal edema) AND [2] new-onset or getting worse  (Exceptions: Caused by hot weather, already seen by doctor or NP/PA for this.)  Answer Assessment - Initial Assessment Questions Patient called in on community line for advise on ankle swelling. He recently completed radiation and has chemo Q Monday. He reports mild ankle swelling, chronic sob that is no worse. Advised to call oncologist, he reports he tried this morning but did not receive an answer, plans to call again. Patient will call back for further guidance if unable to reach oncologist. Advised to elevate feet while awaiting call back from oncologist.     1. LOCATION: Which ankle is swollen? Where is the swelling?     Bilateral  2. ONSET: When did the swelling start?     This morning  3. SWELLING: How bad is the swelling? Or, How large is it? (e.g., mild, moderate, severe; size of localized swelling)      Mild  4. PAIN: Is there any pain? If Yes, ask: How bad is it? (Scale 0-10; or none, mild, moderate, severe)      5. CAUSE: What do you think caused the ankle swelling?     Unsure on chemo Q Monday  6. OTHER SYMPTOMS: Do you have any other symptoms? (e.g., fever, chest pain, difficulty breathing, calf pain)     Shortness of breath at baseline.  7. PREGNANCY: Is there any chance you are pregnant? When was your last menstrual period?  Protocols used: Ankle Swelling-A-AH

## 2024-06-25 NOTE — Progress Notes (Signed)
  Radiation Oncology         [phone] ________________________________  Name: Stephen Leon MRN: [id number]  Date of Service: 07/08/2024  DOB: 1946/03/28  Post Treatment Telephone Note  Diagnosis:  Stage IIIA, [id number], NSCLC, squamous cell carcinoma  of the LUL   First Treatment Date: 2024-04-22 Last Treatment Date: 2024-06-05   Plan Name: Lung_L Site: Lung, Left Technique: 3D Mode: Photon Dose Per Fraction: 2 Gy Prescribed Dose (Delivered / Prescribed): 60 Gy / 60 Gy Prescribed Fxs (Delivered / Prescribed): 30 / 30   Plan Name: Lung_L_Bst Site: Lung, Left Technique: 3D Mode: Photon Dose Per Fraction: 2 Gy Prescribed Dose (Delivered / Prescribed): 6 Gy / 6 Gy Prescribed Fxs (Delivered / Prescribed): 3 / 3  The patient was available for call today.   Symptoms of fatigue have not improved since completing therapy, patient complains of still having some weakness in his legs and is very tired. Drinking liquid IV as much as possible and this seems to help.  Symptoms of skin changes have improved since completing therapy, still has some random itching but using the cream helps a lot per patient.  Symptoms of esophagitis have improved since completing therapy.   The patient has scheduled follow up with his medical oncologist Dr. Sherrod for ongoing care, and was encouraged to call if he  develops concerns or questions regarding radiation.

## 2024-07-01 ENCOUNTER — Inpatient Hospital Stay: Attending: Internal Medicine

## 2024-07-01 ENCOUNTER — Ambulatory Visit (HOSPITAL_COMMUNITY)
Admission: RE | Admit: 2024-07-01 | Discharge: 2024-07-01 | Disposition: A | Discharge status: Home / Self Care | Source: Ambulatory Visit | Attending: Physician Assistant | Admitting: Physician Assistant

## 2024-07-01 DIAGNOSIS — R911 Solitary pulmonary nodule: Secondary | ICD-10-CM | POA: Diagnosis not present

## 2024-07-01 DIAGNOSIS — J432 Centrilobular emphysema: Secondary | ICD-10-CM | POA: Diagnosis not present

## 2024-07-01 DIAGNOSIS — C3490 Malignant neoplasm of unspecified part of unspecified bronchus or lung: Secondary | ICD-10-CM | POA: Diagnosis not present

## 2024-07-01 DIAGNOSIS — C3492 Malignant neoplasm of unspecified part of left bronchus or lung: Secondary | ICD-10-CM | POA: Insufficient documentation

## 2024-07-01 LAB — CBC WITH DIFFERENTIAL (CANCER CENTER ONLY)
Abs Immature Granulocytes: 0.05 K/uL (ref 0.00–0.07)
Basophils Absolute: 0.1 K/uL (ref 0.0–0.1)
Basophils Relative: 1 %
Eosinophils Absolute: 0.2 K/uL (ref 0.0–0.5)
Eosinophils Relative: 3 %
HCT: 33.8 % — ABNORMAL LOW (ref 39.0–52.0)
Hemoglobin: 11.5 g/dL — ABNORMAL LOW (ref 13.0–17.0)
Immature Granulocytes: 1 %
Lymphocytes Relative: 27 %
Lymphs Abs: 1.4 K/uL (ref 0.7–4.0)
MCH: 34.2 pg — ABNORMAL HIGH (ref 26.0–34.0)
MCHC: 34 g/dL (ref 30.0–36.0)
MCV: 100.6 fL — ABNORMAL HIGH (ref 80.0–100.0)
Monocytes Absolute: 0.8 K/uL (ref 0.1–1.0)
Monocytes Relative: 15 %
Neutro Abs: 2.7 K/uL (ref 1.7–7.7)
Neutrophils Relative %: 53 %
Platelet Count: 95 K/uL — ABNORMAL LOW (ref 150–400)
RBC: 3.36 MIL/uL — ABNORMAL LOW (ref 4.22–5.81)
RDW: 18 % — ABNORMAL HIGH (ref 11.5–15.5)
WBC Count: 5.1 K/uL (ref 4.0–10.5)
nRBC: 0 % (ref 0.0–0.2)

## 2024-07-01 LAB — CMP (CANCER CENTER ONLY)
ALT: 10 U/L (ref 0–44)
AST: 24 U/L (ref 15–41)
Albumin: 4 g/dL (ref 3.5–5.0)
Alkaline Phosphatase: 62 U/L (ref 38–126)
Anion gap: 12 (ref 5–15)
BUN: 10 mg/dL (ref 8–23)
CO2: 26 mmol/L (ref 22–32)
Calcium: 9.3 mg/dL (ref 8.9–10.3)
Chloride: 100 mmol/L (ref 98–111)
Creatinine: 0.67 mg/dL (ref 0.61–1.24)
GFR, Estimated: >60 mL/min (ref >60)
Glucose, Bld: 112 mg/dL — ABNORMAL HIGH (ref 70–99)
Potassium: 3.6 mmol/L (ref 3.5–5.1)
Sodium: 138 mmol/L (ref 135–145)
Total Bilirubin: 0.8 mg/dL (ref 0.0–1.2)
Total Protein: 7.2 g/dL (ref 6.5–8.1)

## 2024-07-01 MED ORDER — IOHEXOL 300 MG/ML  SOLN
75.0000 mL | Freq: Once | INTRAMUSCULAR | Status: AC | PRN
  Administered 2024-07-01: 75 mL via INTRAVENOUS

## 2024-07-01 MED ORDER — SODIUM CHLORIDE (PF) 0.9 % IJ SOLN
INTRAMUSCULAR | Status: AC
  Filled 2024-07-01: qty 50

## 2024-07-08 ENCOUNTER — Ambulatory Visit
Admission: RE | Admit: 2024-07-08 | Discharge: 2024-07-08 | Disposition: A | Source: Ambulatory Visit | Attending: Radiation Oncology

## 2024-07-08 DIAGNOSIS — C801 Malignant (primary) neoplasm, unspecified: Secondary | ICD-10-CM

## 2024-07-08 DIAGNOSIS — C3491 Malignant neoplasm of unspecified part of right bronchus or lung: Secondary | ICD-10-CM

## 2024-07-11 ENCOUNTER — Inpatient Hospital Stay: Attending: Radiation Oncology | Admitting: Internal Medicine

## 2024-07-11 VITALS — BP 127/71 | HR 77 | Temp 97.8°F | Resp 17 | Ht 67.0 in | Wt 132.0 lb

## 2024-07-11 DIAGNOSIS — D696 Thrombocytopenia, unspecified: Secondary | ICD-10-CM | POA: Insufficient documentation

## 2024-07-11 DIAGNOSIS — Z9221 Personal history of antineoplastic chemotherapy: Secondary | ICD-10-CM | POA: Diagnosis not present

## 2024-07-11 DIAGNOSIS — D649 Anemia, unspecified: Secondary | ICD-10-CM | POA: Insufficient documentation

## 2024-07-11 DIAGNOSIS — Z7982 Long term (current) use of aspirin: Secondary | ICD-10-CM | POA: Insufficient documentation

## 2024-07-11 DIAGNOSIS — C3492 Malignant neoplasm of unspecified part of left bronchus or lung: Secondary | ICD-10-CM

## 2024-07-11 DIAGNOSIS — Z5112 Encounter for antineoplastic immunotherapy: Secondary | ICD-10-CM | POA: Insufficient documentation

## 2024-07-11 DIAGNOSIS — Z79620 Long term (current) use of immunosuppressive biologic: Secondary | ICD-10-CM | POA: Diagnosis not present

## 2024-07-11 DIAGNOSIS — Z8701 Personal history of pneumonia (recurrent): Secondary | ICD-10-CM | POA: Insufficient documentation

## 2024-07-11 DIAGNOSIS — E860 Dehydration: Secondary | ICD-10-CM | POA: Diagnosis not present

## 2024-07-11 DIAGNOSIS — Z902 Acquired absence of lung [part of]: Secondary | ICD-10-CM | POA: Insufficient documentation

## 2024-07-11 DIAGNOSIS — Z923 Personal history of irradiation: Secondary | ICD-10-CM | POA: Insufficient documentation

## 2024-07-11 DIAGNOSIS — Z79899 Other long term (current) drug therapy: Secondary | ICD-10-CM | POA: Insufficient documentation

## 2024-07-11 DIAGNOSIS — C3412 Malignant neoplasm of upper lobe, left bronchus or lung: Secondary | ICD-10-CM | POA: Diagnosis present

## 2024-07-11 DIAGNOSIS — J449 Chronic obstructive pulmonary disease, unspecified: Secondary | ICD-10-CM | POA: Insufficient documentation

## 2024-07-11 DIAGNOSIS — I252 Old myocardial infarction: Secondary | ICD-10-CM | POA: Diagnosis not present

## 2024-07-11 DIAGNOSIS — Z8679 Personal history of other diseases of the circulatory system: Secondary | ICD-10-CM | POA: Diagnosis not present

## 2024-07-11 NOTE — Progress Notes (Signed)
 [facility] Telephone:([phone]) [phone]   Fax:([phone]) [phone]  OFFICE PROGRESS NOTE  [name], MD [address] [place] [address]  DIAGNOSIS: Stage IIIa (T1b, N2, M0) non-small cell lung cancer, squamous cell carcinoma presented with left upper lobe lung nodule in addition to left hilar and mediastinal lymphadenopathy. This was diagnosed in August 2025.    PRIOR THERAPY: The patient also has a history of stage IIb (T3, N0, M0) non-small cell lung cancer, adenocarcinoma status post right upper lobectomy with lymph node dissection at [facility] on May 17, 2012 with a tumor size of 6.5 cm. He was also treated with adjuvant chemotherapy at that time.   CURRENT THERAPY: A course of concurrent chemoradiation with weekly carboplatin  for AUC of 2 and paclitaxel  45 mg/M2 status post 7 cycles.  Last dose was given June 03, 2024.  INTERVAL HISTORY: Stephen Leon 78 y.o. male returns to the clinic today for follow-up visit accompanied by his wife.Discussed the use of AI scribe software for clinical note transcription with the patient, who gave verbal consent to proceed.  History of Present Illness Stephen Leon is a 78 year old male with stage IIIA non-small cell lung cancer who presents with fatigue and weakness post-chemoradiation therapy.  He was diagnosed with stage IIIA non-small cell lung cancer, squamous cell carcinoma, in August 2025. He completed a course of concurrent chemoradiation with weekly carboplatin  and paclitaxel , with the last dose administered on June 03, 2024.  He experiences significant fatigue and weakness, particularly after exercise, stating 'if I do any exercise at all, I just fall asleep.' His legs feel weak, and he experiences dehydration, requiring the use of a powdered IV liquid for hydration. Recent lab results show a hemoglobin level of 11.5 and a platelet count  of 95,000. His white blood cell count has recovered.  He recently traveled to [place] , which he found exhausting, stating 'that took it out of him.'     MEDICAL HISTORY: Past Medical History:  Diagnosis Date   Anxiety    Bronchitis    Cancer (HCC) 2016   lung   COPD (chronic obstructive pulmonary disease) (HCC)    Coronary artery disease 09/26/2023   Cough 07/13/2007   Qualifier: Diagnosis of  By: Georgian ROSALEA CHARM Lamar    Dyspnea    due to copd - w/exertion   GERD (gastroesophageal reflux disease)    Hypertension    pt denies this and is not on bp meds but vascular note states htn   LUNG NODULE 07/13/2007   Qualifier: Diagnosis of  By: Georgian ROSALEA CHARM Lamar    Myocardial infarction [facility]) 2023   mild per patient   Nodule of left lung 03/2024   upper lobe   Pneumonia 2013   Thoracic aortic aneurysm    followed by vascular    ALLERGIES:  has no known allergies.  MEDICATIONS:  Current Outpatient Medications  Medication Sig Dispense Refill   sucralfate  (CARAFATE ) 1 g tablet Take 1 tablet (1 g total) by mouth 4 (four) times daily -  with meals and at bedtime. 5 min before meals for radiation induced esophagitis 120 tablet 2   albuterol  (VENTOLIN  HFA) 108 (90 Base) MCG/ACT inhaler Inhale 2 puffs into the lungs every 6 (six) hours as needed.     aspirin  EC 81 MG tablet Take 81 mg by mouth daily. Swallow whole.     budesonide-glycopyrrolate -formoterol (BREZTRI  AEROSPHERE) 160-9-4.8 MCG/ACT  AERO inhaler Inhale 2 puffs into the lungs in the morning and at bedtime. 11.8 g 0   budesonide-glycopyrrolate -formoterol (BREZTRI  AEROSPHERE) 160-9-4.8 MCG/ACT AERO inhaler Inhale 2 puffs into the lungs in the morning and at bedtime. (Patient not taking: Reported on 04/29/2024)     chlorpheniramine-HYDROcodone  (TUSSIONEX) 10-8 MG/5ML Take 5 mLs by mouth at bedtime as needed. 473 mL 0   dexamethasone  (DECADRON ) 4 MG tablet Take 2 tablets daily for 2 days, start the day after chemotherapy. Take with food.  30 tablet 1   gabapentin  (NEURONTIN ) 800 MG tablet Take 800 mg by mouth 2 (two) times daily.      HYDROcodone -acetaminophen  (NORCO/VICODIN) 5-325 MG tablet Take 1 tablet by mouth every 12 (twelve) hours as needed for moderate pain (pain score 4-6). 30 tablet 0   ibuprofen  (ADVIL ) 800 MG tablet Take 800 mg by mouth every 8 (eight) hours as needed for mild pain.     ipratropium-albuterol  (DUONEB) 0.5-2.5 (3) MG/3ML SOLN Inhale 3 mLs into the lungs every 4 (four) hours as needed (When he get sick and the doctor tells him to take it).     lidocaine -prilocaine  (EMLA ) cream Apply to affected area once 30 g 3   omeprazole (PRILOSEC) 40 MG capsule Take 40 mg by mouth daily.     ondansetron  (ZOFRAN ) 8 MG tablet Take 1 tablet (8 mg total) by mouth every 8 (eight) hours as needed for nausea or vomiting. Start on the third day after chemotherapy. 30 tablet 1   prochlorperazine  (COMPAZINE ) 10 MG tablet Take 1 tablet (10 mg total) by mouth every 6 (six) hours as needed for nausea or vomiting. 30 tablet 1   tamsulosin (FLOMAX) 0.4 MG CAPS capsule Take 0.4 mg by mouth at bedtime.     No current facility-administered medications for this visit.    SURGICAL HISTORY:  Past Surgical History:  Procedure Laterality Date   BIOPSY  07/13/2023   Procedure: BIOPSY;  Surgeon: Onita Elspeth Sharper, DO;  Location: [facility] ENDOSCOPY;  Service: Gastroenterology;;   BRONCHIAL BRUSHINGS  03/26/2024   Procedure: BRONCHOSCOPY, WITH BRUSH BIOPSY;  Surgeon: [name], MD;  Location: [facility] ENDOSCOPY;  Service: Pulmonary;;   BRONCHIAL NEEDLE ASPIRATION BIOPSY  03/26/2024   Procedure: BRONCHOSCOPY, WITH NEEDLE ASPIRATION BIOPSY;  Surgeon: [name], MD;  Location: [facility] ENDOSCOPY;  Service: Pulmonary;;   COLONOSCOPY N/A 10/27/2022   hx polyps; Procedure: COLONOSCOPY;  Surgeon: Onita Elspeth Sharper, DO;  Location: [facility] ENDOSCOPY;  Service: Gastroenterology;  Laterality: N/A;   COLONOSCOPY WITH PROPOFOL  N/A 07/13/2023    Procedure: COLONOSCOPY WITH PROPOFOL ;  Surgeon: Onita Elspeth Sharper, DO;  Location: [facility] ENDOSCOPY;  Service: Gastroenterology;  Laterality: N/A;   CORONARY PRESSURE/FFR STUDY N/A 05/19/2020   Procedure: INTRAVASCULAR PRESSURE WIRE/FFR STUDY;  Surgeon: Ammon Blunt, MD;  Location: [facility] INVASIVE CV LAB;  Service: Cardiovascular;  Laterality: N/A;   DIAGNOSTIC LAPAROSCOPY     ENDOBRONCHIAL ULTRASOUND Left 03/26/2024   Procedure: ENDOBRONCHIAL ULTRASOUND (EBUS);  Surgeon: [name], MD;  Location: [facility] ENDOSCOPY;  Service: Pulmonary;  Laterality: Left;   ESOPHAGOGASTRODUODENOSCOPY N/A 10/27/2022   Procedure: ESOPHAGOGASTRODUODENOSCOPY (EGD);  Surgeon: Onita Elspeth Sharper, DO;  Location: [facility] ENDOSCOPY;  Service: Gastroenterology;  Laterality: N/A;   EYE SURGERY Bilateral    HERNIA REPAIR     KYPHOPLASTY N/A 12/13/2018   Procedure: KYPHOPLASTY- T7;  Surgeon: Kathlynn Sharper, MD;  Location: [facility] ORS;  Service: Orthopedics;  Laterality: N/A;   LEFT HEART CATH N/A 05/11/2022   Procedure: Left Heart Cath;  Surgeon:  Ammon Blunt, MD;  Location: [facility] INVASIVE CV LAB;  Service: Cardiovascular;  Laterality: N/A;   LEFT HEART CATH AND CORONARY ANGIOGRAPHY Left 05/19/2020   Procedure: LEFT HEART CATH AND CORONARY ANGIOGRAPHY;  Surgeon: Ammon Blunt, MD;  Location: [facility] INVASIVE CV LAB;  Service: Cardiovascular;  Laterality: Left;   Robotic assisted Right upper lobectomy Right 05/17/2012   [facility]   VIDEO BRONCHOSCOPY WITH ENDOBRONCHIAL NAVIGATION Left 03/26/2024   Procedure: VIDEO BRONCHOSCOPY WITH ENDOBRONCHIAL NAVIGATION;  Surgeon: [name], MD;  Location: [facility] ENDOSCOPY;  Service: Pulmonary;  Laterality: Left;   XI ROBOTIC ASSISTED INGUINAL HERNIA REPAIR WITH MESH Right 07/19/2019   Procedure: XI ROBOTIC ASSISTED INGUINAL HERNIA REPAIR WITH MESH;  Surgeon: Tye Millet, DO;  Location: [facility] ORS;  Service: General;  Laterality: Right;    REVIEW  OF SYSTEMS:  Constitutional: positive for fatigue and weight loss Eyes: negative Ears, nose, mouth, throat, and face: negative Respiratory: negative Cardiovascular: negative Gastrointestinal: positive for nausea Genitourinary:negative Integument/breast: negative Hematologic/lymphatic: negative Musculoskeletal:negative Neurological: negative Behavioral/Psych: negative Endocrine: negative Allergic/Immunologic: negative   PHYSICAL EXAMINATION: General appearance: alert, cooperative, fatigued, and no distress Head: Normocephalic, without obvious abnormality, atraumatic Neck: no adenopathy, no JVD, supple, symmetrical, trachea midline, and thyroid  not enlarged, symmetric, no tenderness/mass/nodules Lymph nodes: Cervical, supraclavicular, and axillary nodes normal. Resp: clear to auscultation bilaterally Back: symmetric, no curvature. ROM normal. No CVA tenderness. Cardio: regular rate and rhythm, S1, S2 normal, no murmur, click, rub or gallop GI: soft, non-tender; bowel sounds normal; no masses,  no organomegaly Extremities: extremities normal, atraumatic, no cyanosis or edema Neurologic: Alert and oriented X 3, normal strength and tone. Normal symmetric reflexes. Normal coordination and gait  ECOG PERFORMANCE STATUS: 1 - Symptomatic but completely ambulatory  Blood pressure 127/71, pulse 77, temperature 97.8 F (36.6 C), temperature source Temporal, resp. rate 17, height 5' 7 (1.702 m), weight 132 lb (59.9 kg), SpO2 94%.  LABORATORY DATA: Lab Results  Component Value Date   WBC 5.1 07/01/2024   HGB 11.5 (L) 07/01/2024   HCT 33.8 (L) 07/01/2024   MCV 100.6 (H) 07/01/2024   PLT 95 (L) 07/01/2024      Chemistry      Component Value Date/Time   NA 138 07/01/2024 1122   K 3.6 07/01/2024 1122   CL 100 07/01/2024 1122   CO2 26 07/01/2024 1122   BUN 10 07/01/2024 1122   CREATININE 0.67 07/01/2024 1122      Component Value Date/Time   CALCIUM  9.3 07/01/2024 1122   ALKPHOS 62  07/01/2024 1122   AST 24 07/01/2024 1122   ALT 10 07/01/2024 1122   BILITOT 0.8 07/01/2024 1122       RADIOGRAPHIC STUDIES: CT Chest W Contrast Result Date: 07/05/2024 EXAM: CT CHEST WITH CONTRAST 07/01/2024 01:13:40 PM TECHNIQUE: CT of the chest was performed with the administration of 75 mL of iohexol  (OMNIPAQUE ) 300 MG/ML solution. Multiplanar reformatted images are provided for review. Automated exposure control, iterative reconstruction, and/or weight based adjustment of the mA/kV was utilized to reduce the radiation dose to as low as reasonably achievable. COMPARISON: Chest CT angiogram dated 03/10/2024, screening chest CT dated 02/16/2024, and PET CT dated 03/06/2024. CLINICAL HISTORY: Non-small cell lung cancer (NSCLC), non-metastatic, assess treatment response. * Tracking Code: BO * FINDINGS: MEDIASTINUM: Left anterior descending and left circumflex coronary atherosclerosis. Pericardium is unremarkable. Atherosclerotic nonaneurysmal thoracic aorta. Normal caliber main pulmonary artery. The central airways are clear. LYMPH NODES: Previously visualized mildly enlarged and mildly hypermetabolic aortopulmonary window  and left hilar lymph nodes have resolved. No pathologically enlarged mediastinal or hilar lymph nodes. No axillary adenopathy. LUNGS AND PLEURA: Right upper lobectomy. Moderate centrilobular emphysema. No acute interval consolidation of airspace disease. Solid irregular 1.1 x 0.8 cm anterior peripheral left upper lobe pulmonary nodule on series 6, image 43, decreased from 1.5 x 1.2 cm on 03/10/2024 chest CT angiogram. No new significant pulmonary nodules. Stable calcified bilateral pleural plaques. No pleural effusion or pneumothorax. SOFT TISSUES/BONES: Chronic severe T7 vertebral compression fracture status post vertebroplasty. No acute abnormality of the soft tissues. UPPER ABDOMEN: Limited images of the upper abdomen demonstrates no acute abnormality. IMPRESSION: 1. Interval partial  positive response to therapy. 2. Decreased size of the peripheral left upper lobe pulmonary nodule, now measuring 1.1 x 0.8 cm, previously 1.5 x 1.2 cm on 03/10/24 chest CT angiogram. 3. Resolved left mediastinal and left hilar adenopathy. 4. No new or progressive metastatic disease in the chest. 5. Emphysema (ICD10-J43.9). 6. Aortic Atherosclerosis (ICD10-I70.0). Electronically signed by: Selinda Blue MD 07/05/2024 02:27 PM EST RP Workstation: [id number]     ASSESSMENT AND PLAN: This is a very pleasant 78 years old white male diagnosed with stage IIIa (T1b, N2, M0) non-small cell lung cancer, squamous cell carcinoma presented with left upper lobe lung nodule in addition to left hilar and mediastinal lymphadenopathy. This was diagnosed in August 2025. The patient also has a history of stage IIb (T3, N0, M0) non-small cell lung cancer, adenocarcinoma status post right upper lobectomy with lymph node dissection at [facility] on May 17, 2012 with a tumor size of 6.5 cm. He was also treated with adjuvant chemotherapy at that time.  The patient is currently undergoing a course of concurrent chemoradiation with weekly carboplatin  for AUC of 2 and paclitaxel  45 mg/M2.  Status post 7 cycles.   The patient tolerated his treatment well except for fatigue. He had repeat CT scan of the chest performed recently.  I personally independently reviewed the scan and discussed the result with the patient and his wife.  His scan showed partial response. Assessment and Plan Assessment & Plan Stage IIIA non-small cell lung cancer, squamous cell carcinoma, left upper lobe, status post chemoradiation, planned adjuvant immunotherapy Stage IIIA non-small cell lung cancer, squamous cell carcinoma, located in the left upper lobe. Status post chemoradiation with concurrent weekly carboplatin  and paclitaxel , last dose on October 27th, 2025. Recent imaging shows interval partial positive response to therapy  with decreased size of the peripheral left upper lobe lesion and resolution of left mediastinal and left hilar adenopathy. No new lesions observed. Good response to treatment. - Will initiate adjuvant immunotherapy with durvalumab 1500 mg IV infusion every four weeks for one year to prevent cancer recurrence and improve five-year survival. Discussed potential side effects including inflammation, rash, diarrhea, itching, and thyroid  dysfunction. Emphasized the importance of monitoring thyroid  function due to potential impact on thyroid  gland. - Will arrange immunotherapy treatment to start next week. - Will schedule follow-up appointment in five weeks for the second round of immunotherapy.  Fatigue Persistent fatigue post-chemoradiation, exacerbated by physical activity. Hemoglobin level is 11.5, which is low but not significantly so to account for the degree of fatigue. Fatigue may also be related to recent travel and overall recovery from treatment. - Encouraged gradual increase in physical activity as tolerated.  Thrombocytopenia Platelet count is 95,000, indicating thrombocytopenia.  Anemia Hemoglobin level is 11.5, indicating mild anemia. Not severe enough to account for significant fatigue.  Dehydration Reports  of dehydration requiring use of IV liquid powder for hydration. - Continue use of IV liquid powder for hydration as needed. He was advised to call immediately if he has any other concerning symptoms in the interval.  The patient voices understanding of current disease status and treatment options and is in agreement with the current care plan.  All questions were answered. The patient knows to call the clinic with any problems, questions or concerns. We can certainly see the patient much sooner if necessary.  The total time spent in the appointment was 30 minutes including review of chart and various tests results, discussions about plan of care and coordination of care plan  .   Disclaimer: This note was dictated with voice recognition software. Similar sounding words can inadvertently be transcribed and may not be corrected upon review.

## 2024-07-11 NOTE — Progress Notes (Signed)
 DISCONTINUE ON PATHWAY REGIMEN - Non-Small Cell Lung     A cycle is every 7 days, concurrent with RT:     Paclitaxel       Carboplatin    **Always confirm dose/schedule in your pharmacy ordering system**  PRIOR TREATMENT: [id number]: Carboplatin  AUC=2 + Paclitaxel  45 mg/m2 Weekly During Radiation  START ON PATHWAY REGIMEN - Non-Small Cell Lung     A cycle is every 28 days:     Durvalumab   **Always confirm dose/schedule in your pharmacy ordering system**  Patient Characteristics: Preoperative or Nonsurgical Candidate (Clinical Staging), Stage IIB (N2a only) or Stage III - Nonsurgical Candidate, PS = 0,1 Therapeutic Status: Preoperative or Nonsurgical Candidate (Clinical Staging) Check here if patient was staged using an edition other than AJCC Staging 9th Edition: false AJCC T Category: cT1b AJCC N Category: cN2b AJCC M Category: cM0 AJCC 9 Stage Grouping: IIIA ECOG Performance Status: 1 Intent of Therapy: Curative Intent, Discussed with Patient

## 2024-07-16 ENCOUNTER — Other Ambulatory Visit: Payer: Self-pay

## 2024-07-17 ENCOUNTER — Other Ambulatory Visit: Payer: Self-pay

## 2024-07-18 ENCOUNTER — Inpatient Hospital Stay

## 2024-07-18 VITALS — BP 141/74 | HR 66 | Temp 98.7°F | Resp 16 | Wt 129.0 lb

## 2024-07-18 DIAGNOSIS — Z5112 Encounter for antineoplastic immunotherapy: Secondary | ICD-10-CM | POA: Diagnosis not present

## 2024-07-18 DIAGNOSIS — C3492 Malignant neoplasm of unspecified part of left bronchus or lung: Secondary | ICD-10-CM

## 2024-07-18 LAB — CBC WITH DIFFERENTIAL (CANCER CENTER ONLY)
Abs Immature Granulocytes: 0.08 K/uL — ABNORMAL HIGH (ref 0.00–0.07)
Basophils Absolute: 0.1 K/uL (ref 0.0–0.1)
Basophils Relative: 1 %
Eosinophils Absolute: 0.2 K/uL (ref 0.0–0.5)
Eosinophils Relative: 3 %
HCT: 35.4 % — ABNORMAL LOW (ref 39.0–52.0)
Hemoglobin: 12 g/dL — ABNORMAL LOW (ref 13.0–17.0)
Immature Granulocytes: 1 %
Lymphocytes Relative: 22 %
Lymphs Abs: 1.3 K/uL (ref 0.7–4.0)
MCH: 35 pg — ABNORMAL HIGH (ref 26.0–34.0)
MCHC: 33.9 g/dL (ref 30.0–36.0)
MCV: 103.2 fL — ABNORMAL HIGH (ref 80.0–100.0)
Monocytes Absolute: 1 K/uL (ref 0.1–1.0)
Monocytes Relative: 17 %
Neutro Abs: 3.3 K/uL (ref 1.7–7.7)
Neutrophils Relative %: 56 %
Platelet Count: 117 K/uL — ABNORMAL LOW (ref 150–400)
RBC: 3.43 MIL/uL — ABNORMAL LOW (ref 4.22–5.81)
RDW: 17.5 % — ABNORMAL HIGH (ref 11.5–15.5)
WBC Count: 5.9 K/uL (ref 4.0–10.5)
nRBC: 0 % (ref 0.0–0.2)

## 2024-07-18 LAB — CMP (CANCER CENTER ONLY)
ALT: 10 U/L (ref 0–44)
AST: 21 U/L (ref 15–41)
Albumin: 4 g/dL (ref 3.5–5.0)
Alkaline Phosphatase: 66 U/L (ref 38–126)
Anion gap: 10 (ref 5–15)
BUN: 10 mg/dL (ref 8–23)
CO2: 25 mmol/L (ref 22–32)
Calcium: 9.2 mg/dL (ref 8.9–10.3)
Chloride: 101 mmol/L (ref 98–111)
Creatinine: 0.76 mg/dL (ref 0.61–1.24)
GFR, Estimated: >60 mL/min (ref >60)
Glucose, Bld: 111 mg/dL — ABNORMAL HIGH (ref 70–99)
Potassium: 4.5 mmol/L (ref 3.5–5.1)
Sodium: 136 mmol/L (ref 135–145)
Total Bilirubin: 0.7 mg/dL (ref 0.0–1.2)
Total Protein: 7.3 g/dL (ref 6.5–8.1)

## 2024-07-18 LAB — TSH: TSH: 1.14 u[IU]/mL (ref 0.350–4.500)

## 2024-07-18 MED ORDER — SODIUM CHLORIDE 0.9 % IV SOLN: INTRAVENOUS | Status: DC

## 2024-07-18 MED ORDER — SODIUM CHLORIDE 0.9 % IV SOLN
1500.0000 mg | Freq: Once | INTRAVENOUS | Status: AC
  Administered 2024-07-18: 1500 mg via INTRAVENOUS
  Filled 2024-07-18: qty 30

## 2024-07-18 NOTE — Patient Instructions (Addendum)
 [facility] - A DEPT OF [facility]  Discharge Instructions: Thank you for choosing [facility] to provide your oncology and hematology care.   If you have a lab appointment with the Cancer Center, please go directly to the Cancer Center and check in at the registration area.   Wear comfortable clothing and clothing appropriate for easy access to any Portacath or PICC line.   We strive to give you quality time with your provider. You may need to reschedule your appointment if you arrive late (15 or more minutes).  Arriving late affects you and other patients whose appointments are after yours.  Also, if you miss three or more appointments without notifying the office, you may be dismissed from the clinic at the providers discretion.      For prescription refill requests, have your pharmacy contact our office and allow 72 hours for refills to be completed.    Today you received the following chemotherapy and/or immunotherapy agents: Durvalumab (imfinzi)      To help prevent nausea and vomiting after your treatment, we encourage you to take your nausea medication as directed.  BELOW ARE SYMPTOMS THAT SHOULD BE REPORTED IMMEDIATELY: *FEVER GREATER THAN 100.4 F (38 C) OR HIGHER *CHILLS OR SWEATING *NAUSEA AND VOMITING THAT IS NOT CONTROLLED WITH YOUR NAUSEA MEDICATION *UNUSUAL SHORTNESS OF BREATH *UNUSUAL BRUISING OR BLEEDING *URINARY PROBLEMS (pain or burning when urinating, or frequent urination) *BOWEL PROBLEMS (unusual diarrhea, constipation, pain near the anus) TENDERNESS IN MOUTH AND THROAT WITH OR WITHOUT PRESENCE OF ULCERS (sore throat, sores in mouth, or a toothache) UNUSUAL RASH, SWELLING OR PAIN  UNUSUAL VAGINAL DISCHARGE OR ITCHING   Items with * indicate a potential emergency and should be followed up as soon as possible or go to the Emergency Department if any problems should occur.  Please show the CHEMOTHERAPY ALERT CARD or  IMMUNOTHERAPY ALERT CARD at check-in to the Emergency Department and triage nurse.  Should you have questions after your visit or need to cancel or reschedule your appointment, please contact [facility] - A DEPT OF [facility]  Dept: [phone]  and follow the prompts.  Office hours are 8:00 a.m. to 4:30 p.m. Monday - Friday. Please note that voicemails left after 4:00 p.m. may not be returned until the following business day.  We are closed weekends and major holidays. You have access to a nurse at all times for urgent questions. Please call the main number to the clinic Dept: [phone] and follow the prompts.   For any non-urgent questions, you may also contact your provider using MyChart. We now offer e-Visits for anyone 48 and older to request care online for non-urgent symptoms. For details visit [email or web address].   Also download the MyChart app! Go to the app store, search MyChart, open the app, select [place], and log in with your MyChart username and password.  Durvalumab Injection What is this medication? DURVALUMAB (dur VAL ue mab) treats some types of cancer. It works by helping your immune system slow or stop the spread of cancer cells. It is a monoclonal antibody. This medicine may be used for other purposes; ask your health care provider or pharmacist if you have questions. COMMON BRAND NAME(S): IMFINZI What should I tell my care team before I take this medication? They need to know if you have any of these conditions: Allogeneic stem cell transplant (uses someone else's stem cells)  Autoimmune diseases, such as Crohn disease, ulcerative colitis, lupus History of chest radiation Nervous system problems, such as Guillain-Barre syndrome, myasthenia gravis Organ transplant An unusual or allergic reaction to durvalumab, other medications, foods, dyes, or preservatives Pregnant or trying to get pregnant Breast-feeding How should I use  this medication? This medication is infused into a vein. It is given by your care team in a hospital or clinic setting. A special MedGuide will be given to you before each treatment. Be sure to read this information carefully each time. Talk to your care team about the use of this medication in children. Special care may be needed. Overdosage: If you think you have taken too much of this medicine contact a poison control center or emergency room at once. NOTE: This medicine is only for you. Do not share this medicine with others. What if I miss a dose? Keep appointments for follow-up doses. It is important not to miss your dose. Call your care team if you are unable to keep an appointment. What may interact with this medication? Interactions have not been studied. This list may not describe all possible interactions. Give your health care provider a list of all the medicines, herbs, non-prescription drugs, or dietary supplements you use. Also tell them if you smoke, drink alcohol , or use illegal drugs. Some items may interact with your medicine. What should I watch for while using this medication? Your condition will be monitored carefully while you are receiving this medication. You may need blood work while taking this medication. This medication may cause serious skin reactions. They can happen weeks to months after starting the medication. Contact your care team right away if you notice fevers or flu-like symptoms with a rash. The rash may be red or purple and then turn into blisters or peeling of the skin. You may also notice a red rash with swelling of the face, lips, or lymph nodes in your neck or under your arms. Tell your care team right away if you have any change in your eyesight. Talk to your care team if you may be pregnant. Serious birth defects can occur if you take this medication during pregnancy and for 3 months after the last dose. You will need a negative pregnancy test before  starting this medication. Contraception is recommended while taking this medication and for 3 months after the last dose. Your care team can help you find the option that works for you. Do not breastfeed while taking this medication and for 3 months after the last dose. What side effects may I notice from receiving this medication? Side effects that you should report to your care team as soon as possible: Allergic reactions--skin rash, itching, hives, swelling of the face, lips, tongue, or throat Dry cough, shortness of breath or trouble breathing Eye pain, redness, irritation, or discharge with blurry or decreased vision Heart muscle inflammation--unusual weakness or fatigue, shortness of breath, chest pain, fast or irregular heartbeat, dizziness, swelling of the ankles, feet, or hands Hormone gland problems--headache, sensitivity to light, unusual weakness or fatigue, dizziness, fast or irregular heartbeat, increased sensitivity to cold or heat, excessive sweating, constipation, hair loss, increased thirst or amount of urine, tremors or shaking, irritability Infusion reactions--chest pain, shortness of breath or trouble breathing, feeling faint or lightheaded Kidney injury (glomerulonephritis)--decrease in the amount of urine, red or dark brown urine, foamy or bubbly urine, swelling of the ankles, hands, or feet Liver injury--right upper belly pain, loss of appetite, nausea, light-colored stool, dark yellow  or brown urine, yellowing skin or eyes, unusual weakness or fatigue Pain, tingling, or numbness in the hands or feet, muscle weakness, change in vision, confusion or trouble speaking, loss of balance or coordination, trouble walking, seizures Rash, fever, and swollen lymph nodes Redness, blistering, peeling, or loosening of the skin, including inside the mouth Sudden or severe stomach pain, bloody diarrhea, fever, nausea, vomiting Side effects that usually do not require medical attention  (report these to your care team if they continue or are bothersome): Bone, joint, or muscle pain Diarrhea Fatigue Loss of appetite Nausea Skin rash This list may not describe all possible side effects. Call your doctor for medical advice about side effects. You may report side effects to FDA at 1-800-FDA-1088. Where should I keep my medication? This medication is given in a hospital or clinic. It will not be stored at home. NOTE: This sheet is a summary. It may not cover all possible information. If you have questions about this medicine, talk to your doctor, pharmacist, or health care provider.  2024 Elsevier/Gold Standard (2021-12-07 00:00:00)

## 2024-07-19 LAB — T4: T4, Total: 7.7 ug/dL (ref 4.5–12.0)

## 2024-07-21 ENCOUNTER — Other Ambulatory Visit: Payer: Self-pay

## 2024-07-24 ENCOUNTER — Telehealth: Payer: Self-pay

## 2024-07-24 NOTE — Telephone Encounter (Signed)
 Pt called stating he has had diarrhea for several days. He also woke up this am with sores in his mouth.  Has had productive cough at times. Afebrile. He states he is hydrating. Per Dr Sherrod pt instructed to use salt water rinses for mouth.  May take Imodium.  Monitor symptoms and call for worsening symptoms. Pt voiced understanding

## 2024-08-04 ENCOUNTER — Emergency Department (HOSPITAL_COMMUNITY)

## 2024-08-04 ENCOUNTER — Emergency Department (HOSPITAL_COMMUNITY)
Admission: EM | Admit: 2024-08-04 | Discharge: 2024-08-05 | Discharge status: Against Medical Advice | Attending: Emergency Medicine | Admitting: Emergency Medicine

## 2024-08-04 DIAGNOSIS — C3490 Malignant neoplasm of unspecified part of unspecified bronchus or lung: Secondary | ICD-10-CM | POA: Diagnosis not present

## 2024-08-04 DIAGNOSIS — R059 Cough, unspecified: Secondary | ICD-10-CM | POA: Insufficient documentation

## 2024-08-04 DIAGNOSIS — Z5321 Procedure and treatment not carried out due to patient leaving prior to being seen by health care provider: Secondary | ICD-10-CM | POA: Insufficient documentation

## 2024-08-04 LAB — COMPREHENSIVE METABOLIC PANEL WITH GFR
ALT: 7 U/L (ref 0–44)
AST: 19 U/L (ref 15–41)
Albumin: 3.4 g/dL — ABNORMAL LOW (ref 3.5–5.0)
Alkaline Phosphatase: 89 U/L (ref 38–126)
Anion gap: 10 (ref 5–15)
BUN: 6 mg/dL — ABNORMAL LOW (ref 8–23)
CO2: 26 mmol/L (ref 22–32)
Calcium: 8.8 mg/dL — ABNORMAL LOW (ref 8.9–10.3)
Chloride: 100 mmol/L (ref 98–111)
Creatinine, Ser: 0.64 mg/dL (ref 0.61–1.24)
GFR, Estimated: >60 mL/min
Glucose, Bld: 112 mg/dL — ABNORMAL HIGH (ref 70–99)
Potassium: 4 mmol/L (ref 3.5–5.1)
Sodium: 137 mmol/L (ref 135–145)
Total Bilirubin: 0.6 mg/dL (ref 0.0–1.2)
Total Protein: 7 g/dL (ref 6.5–8.1)

## 2024-08-04 LAB — CBC
HCT: 36 % — ABNORMAL LOW (ref 39.0–52.0)
Hemoglobin: 11.8 g/dL — ABNORMAL LOW (ref 13.0–17.0)
MCH: 34.7 pg — ABNORMAL HIGH (ref 26.0–34.0)
MCHC: 32.8 g/dL (ref 30.0–36.0)
MCV: 105.9 fL — ABNORMAL HIGH (ref 80.0–100.0)
Platelets: 137 K/uL — ABNORMAL LOW (ref 150–400)
RBC: 3.4 MIL/uL — ABNORMAL LOW (ref 4.22–5.81)
RDW: 15.8 % — ABNORMAL HIGH (ref 11.5–15.5)
WBC: 7.3 K/uL (ref 4.0–10.5)
nRBC: 0 % (ref 0.0–0.2)

## 2024-08-04 LAB — TROPONIN T, HIGH SENSITIVITY: Troponin T High Sensitivity: <15 ng/L (ref 0–19)

## 2024-08-04 NOTE — ED Provider Triage Note (Signed)
 Emergency Medicine Provider Triage Evaluation Note  Stephen Leon , a 78 y.o. male  was evaluated in triage.  Pt complains of cough for several weeks and worse over the last 1 week.  He reports that he is coughing so much at night that he is not sleeping well and has started having chest and abdominal soreness from coughing so hard.  He has felt hot and then cold but no documented temperature at home.  He is currently receiving chemotherapy for lung cancer..  Review of Systems  Positive: Cough Negative: Fever  Physical Exam  BP 124/79 (BP Location: Left Arm)   Pulse 75   Temp 97.9 F (36.6 C) (Oral)   Resp 16   Ht 5' 7 (1.702 m)   Wt 58.5 kg   SpO2 93%   BMI 20.20 kg/m  Gen:   Awake, no distress   Resp:  Normal effort  MSK:   Moves extremities without difficulty  Other:    Medical Decision Making  Medically screening exam initiated at 12:18 PM.  Appropriate orders placed.  Stephen Leon was informed that the remainder of the evaluation will be completed by another provider, this initial triage assessment does not replace that evaluation, and the importance of remaining in the ED until their evaluation is complete.     Shermon Warren SAILOR, PA-C 08/04/24 1219

## 2024-08-04 NOTE — ED Notes (Signed)
Called for vitals no response

## 2024-08-04 NOTE — ED Triage Notes (Signed)
 Patient c/o cough non stop H/o COPD/lung cancer Hasn't eaten in a few days  Weakness /fatigue Wife at bedside, concern for infection, their grandson has been sick

## 2024-08-05 LAB — MISC LABCORP TEST (SEND OUT)
LabCorp test name: 140140
Labcorp test code: 140140

## 2024-08-05 NOTE — ED Notes (Signed)
Called for vitals no response

## 2024-08-07 ENCOUNTER — Other Ambulatory Visit: Payer: Self-pay

## 2024-08-14 ENCOUNTER — Other Ambulatory Visit: Payer: Self-pay

## 2024-08-15 ENCOUNTER — Inpatient Hospital Stay: Admitting: Internal Medicine

## 2024-08-15 ENCOUNTER — Telehealth: Payer: Self-pay

## 2024-08-15 ENCOUNTER — Inpatient Hospital Stay

## 2024-08-15 ENCOUNTER — Other Ambulatory Visit: Payer: Self-pay | Admitting: Physician Assistant

## 2024-08-15 DIAGNOSIS — C3492 Malignant neoplasm of unspecified part of left bronchus or lung: Secondary | ICD-10-CM

## 2024-08-15 NOTE — Telephone Encounter (Signed)
 Voicemail left to check on patients wellbeing as well as to reschedule missed appointments.

## 2024-08-15 NOTE — Telephone Encounter (Signed)
 I called patient because he was  over an hour late for his first appointment. He stated that he had called  four times and left a Voicemail letting  us  know that he was sick and  would not be able to make it in  today. I cancelled his infusion appointment for today and send a message to his providers nurse.

## 2024-08-16 ENCOUNTER — Other Ambulatory Visit: Payer: Self-pay

## 2024-08-18 ENCOUNTER — Other Ambulatory Visit: Payer: Self-pay

## 2024-08-19 ENCOUNTER — Other Ambulatory Visit: Payer: Self-pay

## 2024-08-26 ENCOUNTER — Inpatient Hospital Stay

## 2024-08-26 ENCOUNTER — Inpatient Hospital Stay: Admitting: Internal Medicine

## 2024-08-26 ENCOUNTER — Inpatient Hospital Stay: Attending: Radiation Oncology

## 2024-08-26 VITALS — BP 132/82 | HR 80 | Temp 97.6°F | Resp 17 | Ht 67.0 in | Wt 132.0 lb

## 2024-08-26 DIAGNOSIS — L299 Pruritus, unspecified: Secondary | ICD-10-CM | POA: Insufficient documentation

## 2024-08-26 DIAGNOSIS — I252 Old myocardial infarction: Secondary | ICD-10-CM | POA: Insufficient documentation

## 2024-08-26 DIAGNOSIS — C3490 Malignant neoplasm of unspecified part of unspecified bronchus or lung: Secondary | ICD-10-CM | POA: Diagnosis not present

## 2024-08-26 DIAGNOSIS — Z923 Personal history of irradiation: Secondary | ICD-10-CM | POA: Insufficient documentation

## 2024-08-26 DIAGNOSIS — J449 Chronic obstructive pulmonary disease, unspecified: Secondary | ICD-10-CM | POA: Insufficient documentation

## 2024-08-26 DIAGNOSIS — Z9221 Personal history of antineoplastic chemotherapy: Secondary | ICD-10-CM | POA: Insufficient documentation

## 2024-08-26 DIAGNOSIS — Z8701 Personal history of pneumonia (recurrent): Secondary | ICD-10-CM | POA: Insufficient documentation

## 2024-08-26 DIAGNOSIS — Z79899 Other long term (current) drug therapy: Secondary | ICD-10-CM | POA: Insufficient documentation

## 2024-08-26 DIAGNOSIS — Z87891 Personal history of nicotine dependence: Secondary | ICD-10-CM | POA: Insufficient documentation

## 2024-08-26 DIAGNOSIS — C3412 Malignant neoplasm of upper lobe, left bronchus or lung: Secondary | ICD-10-CM | POA: Insufficient documentation

## 2024-08-26 DIAGNOSIS — Z9226 Personal history of immune checkpoint inhibitor therapy: Secondary | ICD-10-CM | POA: Insufficient documentation

## 2024-08-26 DIAGNOSIS — Z5112 Encounter for antineoplastic immunotherapy: Secondary | ICD-10-CM | POA: Insufficient documentation

## 2024-08-26 DIAGNOSIS — R5381 Other malaise: Secondary | ICD-10-CM | POA: Insufficient documentation

## 2024-08-26 DIAGNOSIS — Z7982 Long term (current) use of aspirin: Secondary | ICD-10-CM | POA: Insufficient documentation

## 2024-08-26 DIAGNOSIS — Z902 Acquired absence of lung [part of]: Secondary | ICD-10-CM | POA: Insufficient documentation

## 2024-08-26 DIAGNOSIS — C3492 Malignant neoplasm of unspecified part of left bronchus or lung: Secondary | ICD-10-CM

## 2024-08-26 DIAGNOSIS — R110 Nausea: Secondary | ICD-10-CM | POA: Insufficient documentation

## 2024-08-26 LAB — CBC WITH DIFFERENTIAL (CANCER CENTER ONLY)
Abs Immature Granulocytes: 0.14 K/uL — ABNORMAL HIGH (ref 0.00–0.07)
Basophils Absolute: 0.2 K/uL — ABNORMAL HIGH (ref 0.0–0.1)
Basophils Relative: 2 %
Eosinophils Absolute: 0.3 K/uL (ref 0.0–0.5)
Eosinophils Relative: 4 %
HCT: 36.9 % — ABNORMAL LOW (ref 39.0–52.0)
Hemoglobin: 12.4 g/dL — ABNORMAL LOW (ref 13.0–17.0)
Immature Granulocytes: 2 %
Lymphocytes Relative: 18 %
Lymphs Abs: 1.2 K/uL (ref 0.7–4.0)
MCH: 34.5 pg — ABNORMAL HIGH (ref 26.0–34.0)
MCHC: 33.6 g/dL (ref 30.0–36.0)
MCV: 102.8 fL — ABNORMAL HIGH (ref 80.0–100.0)
Monocytes Absolute: 1 K/uL (ref 0.1–1.0)
Monocytes Relative: 15 %
Neutro Abs: 3.8 K/uL (ref 1.7–7.7)
Neutrophils Relative %: 59 %
Platelet Count: 139 K/uL — ABNORMAL LOW (ref 150–400)
RBC: 3.59 MIL/uL — ABNORMAL LOW (ref 4.22–5.81)
RDW: 15.1 % (ref 11.5–15.5)
WBC Count: 6.5 K/uL (ref 4.0–10.5)
nRBC: 0 % (ref 0.0–0.2)

## 2024-08-26 LAB — CMP (CANCER CENTER ONLY)
ALT: 13 U/L (ref 0–44)
AST: 23 U/L (ref 15–41)
Albumin: 3.9 g/dL (ref 3.5–5.0)
Alkaline Phosphatase: 73 U/L (ref 38–126)
Anion gap: 8 (ref 5–15)
BUN: 12 mg/dL (ref 8–23)
CO2: 30 mmol/L (ref 22–32)
Calcium: 9.3 mg/dL (ref 8.9–10.3)
Chloride: 98 mmol/L (ref 98–111)
Creatinine: 0.81 mg/dL (ref 0.61–1.24)
GFR, Estimated: >60 mL/min
Glucose, Bld: 83 mg/dL (ref 70–99)
Potassium: 5 mmol/L (ref 3.5–5.1)
Sodium: 136 mmol/L (ref 135–145)
Total Bilirubin: 0.5 mg/dL (ref 0.0–1.2)
Total Protein: 7.6 g/dL (ref 6.5–8.1)

## 2024-08-26 MED ORDER — ONDANSETRON HCL 4 MG/2ML IJ SOLN
8.0000 mg | Freq: Once | INTRAMUSCULAR | Status: AC
  Administered 2024-08-26: 8 mg via INTRAVENOUS
  Filled 2024-08-26: qty 4

## 2024-08-26 MED ORDER — SODIUM CHLORIDE 0.9 % IV SOLN: INTRAVENOUS | Status: AC

## 2024-08-26 MED ORDER — METHYLPREDNISOLONE 4 MG PO TBPK: ORAL_TABLET | ORAL | 0 refills | Status: AC

## 2024-08-26 NOTE — Patient Instructions (Signed)

## 2024-08-26 NOTE — Progress Notes (Signed)
 "     [address] [facility] [phone]   [phone]  OFFICE PROGRESS NOTE  Stephen Manna, MD [address] [place] [address]  DIAGNOSIS: Stage IIIa (T1b, N2, M0) non-small cell lung cancer, squamous cell carcinoma presented with left upper lobe lung nodule in addition to left hilar and mediastinal lymphadenopathy. This was diagnosed in August 2025.    PRIOR THERAPY:  1) The patient also has a history of stage IIb (T3, N0, M0) non-small cell lung cancer, adenocarcinoma status post right upper lobectomy with lymph node dissection at [facility] on May 17, 2012 with a tumor size of 6.5 cm. He was also treated with adjuvant chemotherapy at that time.  2) A course of concurrent chemoradiation with weekly carboplatin  for AUC of 2 and paclitaxel  45 mg/M2 status post 7 cycles.  Last dose was given June 03, 2024. 3) A course of consolidation treatment with immunotherapy with durvalumab  1500 mg IV every 4 weeks status post 1 cycle.  This was discontinued secondary to intolerance.  CURRENT THERAPY: Observation.  INTERVAL HISTORY: Stephen Leon 79 y.o. male returns to the clinic today for follow-up visit accompanied by his wife. Discussed the use of AI scribe software for clinical note transcription with the patient, who gave verbal consent to proceed.  History of Present Illness Stephen Leon is a 79 year old male with stage III non-small cell lung cancer on durvalumab  who presents with severe functional decline and systemic symptoms attributed to immunotherapy.  He is currently receiving durvalumab  following one year of carboplatin  and paclitaxel . He reports profound fatigue and exhaustion, with severe functional decline such that he is often unable to walk more than a short distance before experiencing leg weakness. He is frequently confined to bed or the couch for most of the day and  describes persistent tiredness. He states he is unable to perform daily activities and expresses significant distress regarding his quality of life.  He experiences ongoing nausea, with episodes severe enough to require bed rest. He previously had persistent diarrhea, which has resolved as of this morning. He also has a chronic cough, significant dyspnea, and generalized pruritus. Pruritus has resulted in excoriations and sores on his back. He reports persistent depressive symptoms, corroborated by his wife, in the context of his functional decline.  He stopped smoking as of January 1st.   MEDICAL HISTORY: Past Medical History:  Diagnosis Date   Anxiety    Bronchitis    Cancer (HCC) 2016   lung   COPD (chronic obstructive pulmonary disease) (HCC)    Coronary artery disease 09/26/2023   Cough 07/13/2007   Qualifier: Diagnosis of  By: Georgian ROSALEA CHARM Lamar    Dyspnea    due to copd - w/exertion   GERD (gastroesophageal reflux disease)    Hypertension    pt denies this and is not on bp meds but vascular note states htn   LUNG NODULE 07/13/2007   Qualifier: Diagnosis of  By: Georgian ROSALEA CHARM Lamar    Myocardial infarction [facility]) 2023   mild per patient   Nodule of left lung 03/2024   upper lobe   Pneumonia 2013   Thoracic aortic aneurysm    followed by vascular    ALLERGIES:  has no known allergies.  MEDICATIONS:  Current Outpatient Medications  Medication Sig Dispense Refill   sucralfate  (CARAFATE ) 1 g tablet Take 1 tablet (1 g total) by mouth 4 (four) times daily -  with meals and at bedtime. 5 min before meals for radiation induced esophagitis 120 tablet 2   albuterol  (VENTOLIN  HFA) 108 (90 Base) MCG/ACT inhaler Inhale 2 puffs into the lungs every 6 (six) hours as needed.     aspirin  EC 81 MG tablet Take 81 mg by mouth daily. Swallow whole.     budesonide-glycopyrrolate -formoterol (BREZTRI  AEROSPHERE) 160-9-4.8 MCG/ACT AERO inhaler Inhale 2 puffs into the lungs in the morning and at  bedtime. 11.8 g 0   budesonide-glycopyrrolate -formoterol (BREZTRI  AEROSPHERE) 160-9-4.8 MCG/ACT AERO inhaler Inhale 2 puffs into the lungs in the morning and at bedtime. (Patient not taking: Reported on 04/29/2024)     chlorpheniramine-HYDROcodone  (TUSSIONEX) 10-8 MG/5ML Take 5 mLs by mouth at bedtime as needed. 473 mL 0   gabapentin  (NEURONTIN ) 800 MG tablet Take 800 mg by mouth 2 (two) times daily.      HYDROcodone -acetaminophen  (NORCO/VICODIN) 5-325 MG tablet Take 1 tablet by mouth every 12 (twelve) hours as needed for moderate pain (pain score 4-6). 30 tablet 0   ibuprofen  (ADVIL ) 800 MG tablet Take 800 mg by mouth every 8 (eight) hours as needed for mild pain.     ipratropium-albuterol  (DUONEB) 0.5-2.5 (3) MG/3ML SOLN Inhale 3 mLs into the lungs every 4 (four) hours as needed (When he get sick and the doctor tells him to take it).     omeprazole (PRILOSEC) 40 MG capsule Take 40 mg by mouth daily.     tamsulosin (FLOMAX) 0.4 MG CAPS capsule Take 0.4 mg by mouth at bedtime.     No current facility-administered medications for this visit.    SURGICAL HISTORY:  Past Surgical History:  Procedure Laterality Date   BIOPSY  07/13/2023   Procedure: BIOPSY;  Surgeon: Onita Elspeth Sharper, DO;  Location: [facility] ENDOSCOPY;  Service: Gastroenterology;;   BRONCHIAL BRUSHINGS  03/26/2024   Procedure: BRONCHOSCOPY, WITH BRUSH BIOPSY;  Surgeon: [name], MD;  Location: [facility] ENDOSCOPY;  Service: Pulmonary;;   BRONCHIAL NEEDLE ASPIRATION BIOPSY  03/26/2024   Procedure: BRONCHOSCOPY, WITH NEEDLE ASPIRATION BIOPSY;  Surgeon: [name], MD;  Location: [facility] ENDOSCOPY;  Service: Pulmonary;;   COLONOSCOPY N/A 10/27/2022   hx polyps; Procedure: COLONOSCOPY;  Surgeon: Onita Elspeth Sharper, DO;  Location: [facility] ENDOSCOPY;  Service: Gastroenterology;  Laterality: N/A;   COLONOSCOPY WITH PROPOFOL  N/A 07/13/2023   Procedure: COLONOSCOPY WITH PROPOFOL ;  Surgeon: Onita Elspeth Sharper, DO;  Location: [facility]  ENDOSCOPY;  Service: Gastroenterology;  Laterality: N/A;   CORONARY PRESSURE/FFR STUDY N/A 05/19/2020   Procedure: INTRAVASCULAR PRESSURE WIRE/FFR STUDY;  Surgeon: Ammon Blunt, MD;  Location: [facility] INVASIVE CV LAB;  Service: Cardiovascular;  Laterality: N/A;   DIAGNOSTIC LAPAROSCOPY     ENDOBRONCHIAL ULTRASOUND Left 03/26/2024   Procedure: ENDOBRONCHIAL ULTRASOUND (EBUS);  Surgeon: [name], MD;  Location: [facility] ENDOSCOPY;  Service: Pulmonary;  Laterality: Left;   ESOPHAGOGASTRODUODENOSCOPY N/A 10/27/2022   Procedure: ESOPHAGOGASTRODUODENOSCOPY (EGD);  Surgeon: Onita Elspeth Sharper, DO;  Location: [facility] ENDOSCOPY;  Service: Gastroenterology;  Laterality: N/A;   EYE SURGERY Bilateral    HERNIA REPAIR     KYPHOPLASTY N/A 12/13/2018   Procedure: KYPHOPLASTY- T7;  Surgeon: Kathlynn Sharper, MD;  Location: [facility] ORS;  Service: Orthopedics;  Laterality: N/A;   LEFT HEART CATH N/A 05/11/2022   Procedure: Left Heart Cath;  Surgeon: Ammon Blunt, MD;  Location: [facility] INVASIVE CV LAB;  Service: Cardiovascular;  Laterality: N/A;   LEFT HEART CATH AND CORONARY ANGIOGRAPHY Left 05/19/2020   Procedure: LEFT HEART CATH AND CORONARY ANGIOGRAPHY;  Surgeon: Ammon,  Marsa, MD;  Location: [facility] INVASIVE CV LAB;  Service: Cardiovascular;  Laterality: Left;   Robotic assisted Right upper lobectomy Right 05/17/2012   [facility]   VIDEO BRONCHOSCOPY WITH ENDOBRONCHIAL NAVIGATION Left 03/26/2024   Procedure: VIDEO BRONCHOSCOPY WITH ENDOBRONCHIAL NAVIGATION;  Surgeon: [name], MD;  Location: [facility] ENDOSCOPY;  Service: Pulmonary;  Laterality: Left;   XI ROBOTIC ASSISTED INGUINAL HERNIA REPAIR WITH MESH Right 07/19/2019   Procedure: XI ROBOTIC ASSISTED INGUINAL HERNIA REPAIR WITH MESH;  Surgeon: Tye Millet, DO;  Location: [facility] ORS;  Service: General;  Laterality: Right;    REVIEW OF SYSTEMS:  Constitutional: positive for anorexia, fatigue, and weight loss Eyes:  negative Ears, nose, mouth, throat, and face: negative Respiratory: positive for cough Cardiovascular: negative Gastrointestinal: positive for nausea Genitourinary:negative Integument/breast: negative Hematologic/lymphatic: negative Musculoskeletal:positive for arthralgias Neurological: negative Behavioral/Psych: negative Endocrine: negative Allergic/Immunologic: negative   PHYSICAL EXAMINATION: General appearance: alert, cooperative, fatigued, and no distress Head: Normocephalic, without obvious abnormality, atraumatic Neck: no adenopathy, no JVD, supple, symmetrical, trachea midline, and thyroid  not enlarged, symmetric, no tenderness/mass/nodules Lymph nodes: Cervical, supraclavicular, and axillary nodes normal. Resp: clear to auscultation bilaterally Back: symmetric, no curvature. ROM normal. No CVA tenderness. Cardio: regular rate and rhythm, S1, S2 normal, no murmur, click, rub or gallop GI: soft, non-tender; bowel sounds normal; no masses,  no organomegaly Extremities: extremities normal, atraumatic, no cyanosis or edema Neurologic: Alert and oriented X 3, normal strength and tone. Normal symmetric reflexes. Normal coordination and gait  ECOG PERFORMANCE STATUS: 1 - Symptomatic but completely ambulatory  Blood pressure 132/82, pulse 80, temperature 97.6 F (36.4 C), temperature source Temporal, resp. rate 17, height 5' 7 (1.702 m), weight 132 lb (59.9 kg), SpO2 96%.  LABORATORY DATA: Lab Results  Component Value Date   WBC 7.3 08/04/2024   HGB 11.8 (L) 08/04/2024   HCT 36.0 (L) 08/04/2024   MCV 105.9 (H) 08/04/2024   PLT 137 (L) 08/04/2024      Chemistry      Component Value Date/Time   NA 137 08/04/2024 1327   K 4.0 08/04/2024 1327   CL 100 08/04/2024 1327   CO2 26 08/04/2024 1327   BUN 6 (L) 08/04/2024 1327   CREATININE 0.64 08/04/2024 1327   CREATININE 0.76 07/18/2024 1302      Component Value Date/Time   CALCIUM  8.8 (L) 08/04/2024 1327   ALKPHOS 89  08/04/2024 1327   AST 19 08/04/2024 1327   AST 21 07/18/2024 1302   ALT 7 08/04/2024 1327   ALT 10 07/18/2024 1302   BILITOT 0.6 08/04/2024 1327   BILITOT 0.7 07/18/2024 1302       RADIOGRAPHIC STUDIES: DG Chest 2 View Result Date: 08/04/2024 CLINICAL DATA:  Cough. Lung carcinoma. Undergoing radiation therapy. EXAM: DG CHEST 2V COMPARISON:  None Available. FINDINGS: The heart size and mediastinal contours are within normal limits. Both lungs are clear. Calcified bilateral pleural plaque again noted, consistent with prior asbestos exposure. Prior midthoracic vertebroplasty noted. IMPRESSION: No active cardiopulmonary disease. Calcified bilateral pleural plaque, consistent with prior asbestos exposure. Electronically Signed   By: Norleen DELENA Kil M.D.   On: 08/04/2024 13:36     ASSESSMENT AND PLAN: This is a very pleasant 79 years old white male diagnosed with stage IIIa (T1b, N2, M0) non-small cell lung cancer, squamous cell carcinoma presented with left upper lobe lung nodule in addition to left hilar and mediastinal lymphadenopathy. This was diagnosed in August 2025. The patient also has a history of stage  IIb (T3, N0, M0) non-small cell lung cancer, adenocarcinoma status post right upper lobectomy with lymph node dissection at [facility] on May 17, 2012 with a tumor size of 6.5 cm. He was also treated with adjuvant chemotherapy at that time.  The patient under went a course of concurrent chemoradiation with weekly carboplatin  for AUC of 2 and paclitaxel  45 mg/M2.  Status post 7 cycles.  The patient also underwent consolidation treatment with immunotherapy with durvalumab  1500 mg IV every 4 weeks status post 1 cycle.  This was discontinued secondary to intolerance. Assessment and Plan Assessment & Plan Lung cancer Lung cancer previously treated with carboplatin  and paclitaxel  for one year, followed by durvalumab  immunotherapy. He is experiencing significant  functional decline and systemic symptoms, without evidence of imminent death or acute cancer-related complications. Immunotherapy was held due to severity of adverse effects and poor tolerance, which outweigh potential benefit of prolonged survival and improved quality of life. - Held durvalumab  immunotherapy. - Ordered follow-up imaging in February. - Scheduled follow-up visit one week post-scan. - Planned periodic imaging every few months to monitor disease status.  Adverse effects of immunotherapy He is experiencing multiple severe adverse effects likely related to immunotherapy, including profound fatigue, weakness, nausea, diarrhea, cough, dyspnea, pruritus, and depressive symptoms, significantly impacting quality of life and functional status. Immunotherapy was discontinued due to intolerable side effects. Supportive measures are being provided to address symptoms and improve energy and appetite. - Administered intravenous fluids with antiemetic in chemotherapy suite. - Prescribed Medrol  Dose Pack to improve appetite and energy. - Cancelled all future immunotherapy appointments. - Sent steroid prescription to [facility] on [address] The patient was advised to call immediately if he has any concerning symptoms in the interval.  The patient voices understanding of current disease status and treatment options and is in agreement with the current care plan.  All questions were answered. The patient knows to call the clinic with any problems, questions or concerns. We can certainly see the patient much sooner if necessary.  The total time spent in the appointment was 30 minutes including review of chart and various tests results, discussions about plan of care and coordination of care plan .   Disclaimer: This note was dictated with voice recognition software. Similar sounding words can inadvertently be transcribed and may not be corrected upon review.        "

## 2024-08-28 ENCOUNTER — Telehealth: Payer: Self-pay | Admitting: Internal Medicine

## 2024-08-28 NOTE — Telephone Encounter (Signed)
 Rescheduled and cancelled appointments per 1/19 los. Called and left a VM with appointment details for the patient.

## 2024-09-12 ENCOUNTER — Inpatient Hospital Stay

## 2024-09-12 ENCOUNTER — Inpatient Hospital Stay: Admitting: Dietician

## 2024-09-12 ENCOUNTER — Inpatient Hospital Stay: Admitting: Internal Medicine

## 2024-09-18 ENCOUNTER — Inpatient Hospital Stay: Attending: Radiation Oncology

## 2024-09-18 ENCOUNTER — Ambulatory Visit (HOSPITAL_COMMUNITY)

## 2024-09-23 ENCOUNTER — Inpatient Hospital Stay

## 2024-09-23 ENCOUNTER — Inpatient Hospital Stay: Attending: Radiation Oncology

## 2024-09-23 ENCOUNTER — Inpatient Hospital Stay: Admitting: Internal Medicine
